# Patient Record
Sex: Female | Born: 1940 | Race: Black or African American | Hispanic: No | Marital: Married | Smoking: Never smoker
Health system: Southern US, Community
[De-identification: ages and names within clinical notes are randomized; demographics above are authoritative.]

## PROBLEM LIST (undated history)

## (undated) DIAGNOSIS — E119 Type 2 diabetes mellitus without complications: Secondary | ICD-10-CM

## (undated) DIAGNOSIS — M1990 Unspecified osteoarthritis, unspecified site: Secondary | ICD-10-CM

## (undated) DIAGNOSIS — I517 Cardiomegaly: Secondary | ICD-10-CM

## (undated) DIAGNOSIS — K219 Gastro-esophageal reflux disease without esophagitis: Secondary | ICD-10-CM

## (undated) DIAGNOSIS — I10 Essential (primary) hypertension: Secondary | ICD-10-CM

## (undated) DIAGNOSIS — E785 Hyperlipidemia, unspecified: Secondary | ICD-10-CM

## (undated) DIAGNOSIS — I200 Unstable angina: Secondary | ICD-10-CM

## (undated) HISTORY — PX: ABDOMINAL HYSTERECTOMY: SHX81

## (undated) HISTORY — DX: Gastro-esophageal reflux disease without esophagitis: K21.9

## (undated) HISTORY — DX: Hyperlipidemia, unspecified: E78.5

## (undated) HISTORY — DX: Cardiomegaly: I51.7

## (undated) HISTORY — DX: Unstable angina: I20.0

## (undated) HISTORY — DX: Unspecified osteoarthritis, unspecified site: M19.90

## (undated) HISTORY — DX: Type 2 diabetes mellitus without complications: E11.9

---

## 2001-04-16 ENCOUNTER — Ambulatory Visit (HOSPITAL_COMMUNITY): Admission: RE | Admit: 2001-04-16 | Discharge: 2001-04-16 | Payer: Self-pay | Admitting: General Surgery

## 2001-05-20 ENCOUNTER — Ambulatory Visit (HOSPITAL_COMMUNITY): Admission: RE | Admit: 2001-05-20 | Discharge: 2001-05-20 | Payer: Self-pay | Admitting: Family Medicine

## 2001-05-20 ENCOUNTER — Encounter: Payer: Self-pay | Admitting: Family Medicine

## 2001-06-25 ENCOUNTER — Other Ambulatory Visit: Admission: RE | Admit: 2001-06-25 | Discharge: 2001-06-25 | Payer: Self-pay | Admitting: Family Medicine

## 2001-09-01 ENCOUNTER — Ambulatory Visit (HOSPITAL_COMMUNITY): Admission: RE | Admit: 2001-09-01 | Discharge: 2001-09-01 | Payer: Self-pay | Admitting: Family Medicine

## 2001-09-01 ENCOUNTER — Encounter: Payer: Self-pay | Admitting: Family Medicine

## 2002-05-21 ENCOUNTER — Encounter: Payer: Self-pay | Admitting: Family Medicine

## 2002-05-21 ENCOUNTER — Ambulatory Visit (HOSPITAL_COMMUNITY): Admission: RE | Admit: 2002-05-21 | Discharge: 2002-05-21 | Payer: Self-pay | Admitting: Family Medicine

## 2002-08-10 ENCOUNTER — Ambulatory Visit (HOSPITAL_COMMUNITY): Admission: RE | Admit: 2002-08-10 | Discharge: 2002-08-10 | Payer: Self-pay | Admitting: Family Medicine

## 2002-08-10 ENCOUNTER — Encounter: Payer: Self-pay | Admitting: Family Medicine

## 2002-08-12 ENCOUNTER — Encounter (HOSPITAL_COMMUNITY): Admission: RE | Admit: 2002-08-12 | Discharge: 2002-09-11 | Payer: Self-pay | Admitting: Family Medicine

## 2002-08-12 ENCOUNTER — Encounter: Payer: Self-pay | Admitting: Family Medicine

## 2002-09-14 ENCOUNTER — Encounter: Payer: Self-pay | Admitting: Family Medicine

## 2002-09-14 ENCOUNTER — Ambulatory Visit (HOSPITAL_COMMUNITY): Admission: RE | Admit: 2002-09-14 | Discharge: 2002-09-14 | Payer: Self-pay | Admitting: Family Medicine

## 2002-10-22 HISTORY — PX: CHOLECYSTECTOMY: SHX55

## 2002-11-13 ENCOUNTER — Ambulatory Visit (HOSPITAL_COMMUNITY): Admission: RE | Admit: 2002-11-13 | Discharge: 2002-11-13 | Payer: Self-pay | Admitting: General Surgery

## 2002-11-24 ENCOUNTER — Encounter: Payer: Self-pay | Admitting: General Surgery

## 2002-11-24 ENCOUNTER — Observation Stay (HOSPITAL_COMMUNITY): Admission: RE | Admit: 2002-11-24 | Discharge: 2002-11-25 | Payer: Self-pay | Admitting: General Surgery

## 2003-06-04 ENCOUNTER — Ambulatory Visit (HOSPITAL_COMMUNITY): Admission: RE | Admit: 2003-06-04 | Discharge: 2003-06-04 | Payer: Self-pay | Admitting: Family Medicine

## 2003-06-04 ENCOUNTER — Encounter: Payer: Self-pay | Admitting: Family Medicine

## 2004-06-13 ENCOUNTER — Ambulatory Visit (HOSPITAL_COMMUNITY): Admission: RE | Admit: 2004-06-13 | Discharge: 2004-06-13 | Payer: Self-pay | Admitting: Family Medicine

## 2004-08-10 ENCOUNTER — Ambulatory Visit (HOSPITAL_COMMUNITY): Admission: RE | Admit: 2004-08-10 | Discharge: 2004-08-10 | Payer: Self-pay | Admitting: Family Medicine

## 2004-08-11 ENCOUNTER — Ambulatory Visit (HOSPITAL_COMMUNITY): Admission: RE | Admit: 2004-08-11 | Discharge: 2004-08-11 | Payer: Self-pay | Admitting: Family Medicine

## 2004-09-18 ENCOUNTER — Ambulatory Visit: Payer: Self-pay | Admitting: Family Medicine

## 2004-10-02 ENCOUNTER — Ambulatory Visit: Payer: Self-pay | Admitting: Family Medicine

## 2004-11-29 ENCOUNTER — Ambulatory Visit: Payer: Self-pay | Admitting: Family Medicine

## 2005-03-23 ENCOUNTER — Ambulatory Visit: Payer: Self-pay | Admitting: Family Medicine

## 2005-06-15 ENCOUNTER — Ambulatory Visit (HOSPITAL_COMMUNITY): Admission: RE | Admit: 2005-06-15 | Discharge: 2005-06-15 | Payer: Self-pay | Admitting: Family Medicine

## 2005-06-27 ENCOUNTER — Ambulatory Visit (HOSPITAL_COMMUNITY): Admission: RE | Admit: 2005-06-27 | Discharge: 2005-06-27 | Payer: Self-pay | Admitting: Family Medicine

## 2005-08-07 ENCOUNTER — Ambulatory Visit: Payer: Self-pay | Admitting: Family Medicine

## 2005-09-18 ENCOUNTER — Ambulatory Visit: Payer: Self-pay | Admitting: Family Medicine

## 2005-11-19 ENCOUNTER — Ambulatory Visit: Payer: Self-pay | Admitting: Family Medicine

## 2006-02-27 ENCOUNTER — Ambulatory Visit: Payer: Self-pay | Admitting: Family Medicine

## 2006-05-14 ENCOUNTER — Ambulatory Visit (HOSPITAL_COMMUNITY): Admission: RE | Admit: 2006-05-14 | Discharge: 2006-05-14 | Payer: Self-pay | Admitting: General Surgery

## 2006-07-02 ENCOUNTER — Ambulatory Visit (HOSPITAL_COMMUNITY): Admission: RE | Admit: 2006-07-02 | Discharge: 2006-07-02 | Payer: Self-pay | Admitting: Family Medicine

## 2006-08-01 ENCOUNTER — Ambulatory Visit: Payer: Self-pay | Admitting: Family Medicine

## 2006-08-17 ENCOUNTER — Encounter: Payer: Self-pay | Admitting: Family Medicine

## 2006-08-17 LAB — CONVERTED CEMR LAB: Pap Smear: NORMAL

## 2006-08-19 ENCOUNTER — Other Ambulatory Visit: Admission: RE | Admit: 2006-08-19 | Discharge: 2006-08-19 | Payer: Self-pay | Admitting: Family Medicine

## 2006-08-19 ENCOUNTER — Encounter: Payer: Self-pay | Admitting: Family Medicine

## 2006-08-19 ENCOUNTER — Ambulatory Visit: Payer: Self-pay | Admitting: Family Medicine

## 2006-09-30 ENCOUNTER — Ambulatory Visit: Payer: Self-pay | Admitting: Family Medicine

## 2006-11-18 ENCOUNTER — Ambulatory Visit: Payer: Self-pay | Admitting: Family Medicine

## 2006-12-23 ENCOUNTER — Ambulatory Visit: Payer: Self-pay | Admitting: Family Medicine

## 2007-01-02 ENCOUNTER — Ambulatory Visit: Payer: Self-pay | Admitting: Family Medicine

## 2007-02-14 ENCOUNTER — Encounter: Payer: Self-pay | Admitting: Family Medicine

## 2007-02-14 LAB — CONVERTED CEMR LAB
CO2: 22 meq/L (ref 19–32)
Calcium: 9.5 mg/dL (ref 8.4–10.5)
Glucose, Bld: 255 mg/dL — ABNORMAL HIGH (ref 70–99)
Hgb A1c MFr Bld: 13.7 % — ABNORMAL HIGH (ref 4.6–6.1)
Potassium: 4.1 meq/L (ref 3.5–5.3)
Sodium: 141 meq/L (ref 135–145)

## 2007-02-17 ENCOUNTER — Ambulatory Visit: Payer: Self-pay | Admitting: Family Medicine

## 2007-02-17 LAB — CONVERTED CEMR LAB
Cholesterol: 149 mg/dL (ref 0–200)
Hemoglobin: 12.4 g/dL (ref 12.0–15.0)
LDL Cholesterol: 83 mg/dL (ref 0–99)
Lymphs Abs: 2.2 10*3/uL (ref 0.7–3.3)
MCHC: 32.1 g/dL (ref 30.0–36.0)
MCV: 89.4 fL (ref 78.0–100.0)
Monocytes Absolute: 0.5 10*3/uL (ref 0.2–0.7)
Monocytes Relative: 10 % (ref 3–11)
Neutro Abs: 1.8 10*3/uL (ref 1.7–7.7)
Neutrophils Relative %: 37 % — ABNORMAL LOW (ref 43–77)
RBC: 4.32 M/uL (ref 3.87–5.11)
Total CHOL/HDL Ratio: 3.2
Triglycerides: 95 mg/dL (ref <150)
VLDL: 19 mg/dL (ref 0–40)
WBC: 4.7 10*3/uL (ref 4.0–10.5)

## 2007-02-18 ENCOUNTER — Ambulatory Visit (HOSPITAL_COMMUNITY): Admission: RE | Admit: 2007-02-18 | Discharge: 2007-02-18 | Payer: Self-pay | Admitting: Family Medicine

## 2007-02-18 ENCOUNTER — Encounter: Payer: Self-pay | Admitting: Family Medicine

## 2007-03-31 ENCOUNTER — Ambulatory Visit: Payer: Self-pay | Admitting: Family Medicine

## 2007-06-18 ENCOUNTER — Ambulatory Visit: Payer: Self-pay | Admitting: Family Medicine

## 2007-07-04 ENCOUNTER — Ambulatory Visit (HOSPITAL_COMMUNITY): Admission: RE | Admit: 2007-07-04 | Discharge: 2007-07-04 | Payer: Self-pay | Admitting: Family Medicine

## 2007-07-29 ENCOUNTER — Ambulatory Visit: Payer: Self-pay | Admitting: Family Medicine

## 2007-09-22 ENCOUNTER — Encounter: Payer: Self-pay | Admitting: Family Medicine

## 2007-09-22 LAB — CONVERTED CEMR LAB
BUN: 21 mg/dL (ref 6–23)
Bilirubin, Direct: 0.1 mg/dL (ref 0.0–0.3)
CO2: 26 meq/L (ref 19–32)
Chloride: 106 meq/L (ref 96–112)
Creatinine, Ser: 0.85 mg/dL (ref 0.40–1.20)
Glucose, Bld: 73 mg/dL (ref 70–99)
Indirect Bilirubin: 0.4 mg/dL (ref 0.0–0.9)
LDL Cholesterol: 80 mg/dL (ref 0–99)
Potassium: 4.4 meq/L (ref 3.5–5.3)
Total Bilirubin: 0.5 mg/dL (ref 0.3–1.2)
Total Protein: 7.4 g/dL (ref 6.0–8.3)
Triglycerides: 93 mg/dL (ref <150)
VLDL: 19 mg/dL (ref 0–40)

## 2007-09-24 ENCOUNTER — Ambulatory Visit: Payer: Self-pay | Admitting: Family Medicine

## 2007-09-26 ENCOUNTER — Ambulatory Visit (HOSPITAL_COMMUNITY): Admission: RE | Admit: 2007-09-26 | Discharge: 2007-09-26 | Payer: Self-pay | Admitting: Pediatrics

## 2007-10-23 ENCOUNTER — Encounter: Payer: Self-pay | Admitting: Family Medicine

## 2008-01-13 ENCOUNTER — Ambulatory Visit: Payer: Self-pay | Admitting: Family Medicine

## 2008-04-14 ENCOUNTER — Encounter: Payer: Self-pay | Admitting: Family Medicine

## 2008-04-14 LAB — CONVERTED CEMR LAB
Alkaline Phosphatase: 74 units/L (ref 39–117)
BUN: 20 mg/dL (ref 6–23)
Basophils Relative: 1 % (ref 0–1)
CO2: 24 meq/L (ref 19–32)
Chloride: 107 meq/L (ref 96–112)
Creatinine, Ser: 0.89 mg/dL (ref 0.40–1.20)
Eosinophils Relative: 3 % (ref 0–5)
HCT: 38.8 % (ref 36.0–46.0)
Hemoglobin: 11.7 g/dL — ABNORMAL LOW (ref 12.0–15.0)
Indirect Bilirubin: 0.2 mg/dL (ref 0.0–0.9)
LDL Cholesterol: 78 mg/dL (ref 0–99)
MCHC: 30.2 g/dL (ref 30.0–36.0)
Monocytes Absolute: 0.6 10*3/uL (ref 0.1–1.0)
Monocytes Relative: 10 % (ref 3–12)
Neutro Abs: 2 10*3/uL (ref 1.7–7.7)
RBC: 4.18 M/uL (ref 3.87–5.11)
Total Bilirubin: 0.3 mg/dL (ref 0.3–1.2)
Triglycerides: 116 mg/dL (ref <150)

## 2008-04-15 ENCOUNTER — Ambulatory Visit: Payer: Self-pay | Admitting: Family Medicine

## 2008-04-19 ENCOUNTER — Encounter: Payer: Self-pay | Admitting: Family Medicine

## 2008-04-21 ENCOUNTER — Encounter: Payer: Self-pay | Admitting: Family Medicine

## 2008-04-21 DIAGNOSIS — I10 Essential (primary) hypertension: Secondary | ICD-10-CM | POA: Insufficient documentation

## 2008-04-21 DIAGNOSIS — E119 Type 2 diabetes mellitus without complications: Secondary | ICD-10-CM

## 2008-04-21 DIAGNOSIS — E785 Hyperlipidemia, unspecified: Secondary | ICD-10-CM | POA: Insufficient documentation

## 2008-04-21 DIAGNOSIS — Z794 Long term (current) use of insulin: Secondary | ICD-10-CM

## 2008-04-21 DIAGNOSIS — E1122 Type 2 diabetes mellitus with diabetic chronic kidney disease: Secondary | ICD-10-CM | POA: Insufficient documentation

## 2008-04-21 DIAGNOSIS — I130 Hypertensive heart and chronic kidney disease with heart failure and stage 1 through stage 4 chronic kidney disease, or unspecified chronic kidney disease: Secondary | ICD-10-CM | POA: Insufficient documentation

## 2008-04-21 DIAGNOSIS — E669 Obesity, unspecified: Secondary | ICD-10-CM | POA: Insufficient documentation

## 2008-04-21 DIAGNOSIS — E782 Mixed hyperlipidemia: Secondary | ICD-10-CM | POA: Insufficient documentation

## 2008-04-21 DIAGNOSIS — E1165 Type 2 diabetes mellitus with hyperglycemia: Secondary | ICD-10-CM | POA: Insufficient documentation

## 2008-07-06 ENCOUNTER — Ambulatory Visit (HOSPITAL_COMMUNITY): Admission: RE | Admit: 2008-07-06 | Discharge: 2008-07-06 | Payer: Self-pay | Admitting: Family Medicine

## 2008-07-20 ENCOUNTER — Ambulatory Visit: Payer: Self-pay | Admitting: Family Medicine

## 2008-08-16 ENCOUNTER — Ambulatory Visit: Payer: Self-pay | Admitting: Family Medicine

## 2008-08-16 LAB — CONVERTED CEMR LAB: Glucose, Bld: 130 mg/dL

## 2008-09-08 ENCOUNTER — Encounter: Payer: Self-pay | Admitting: Family Medicine

## 2008-10-18 ENCOUNTER — Encounter: Payer: Self-pay | Admitting: Family Medicine

## 2008-10-18 LAB — CONVERTED CEMR LAB
ALT: 21 units/L (ref 0–35)
Cholesterol: 140 mg/dL (ref 0–200)
HDL: 47 mg/dL (ref >39)
Indirect Bilirubin: 0.3 mg/dL (ref 0.0–0.9)
Potassium: 4.5 meq/L (ref 3.5–5.3)
Sodium: 144 meq/L (ref 135–145)
Total CHOL/HDL Ratio: 3
Total Protein: 7.1 g/dL (ref 6.0–8.3)
Triglycerides: 90 mg/dL (ref <150)
VLDL: 18 mg/dL (ref 0–40)

## 2008-11-16 ENCOUNTER — Encounter: Payer: Self-pay | Admitting: Family Medicine

## 2008-12-21 ENCOUNTER — Ambulatory Visit: Payer: Self-pay | Admitting: Family Medicine

## 2008-12-21 LAB — CONVERTED CEMR LAB
Glucose, Bld: 170 mg/dL
Hgb A1c MFr Bld: 7.1 %

## 2009-01-26 ENCOUNTER — Encounter: Payer: Self-pay | Admitting: Family Medicine

## 2009-03-08 ENCOUNTER — Ambulatory Visit: Payer: Self-pay | Admitting: Family Medicine

## 2009-03-09 ENCOUNTER — Telehealth: Payer: Self-pay | Admitting: Family Medicine

## 2009-04-18 ENCOUNTER — Encounter: Payer: Self-pay | Admitting: Family Medicine

## 2009-04-18 LAB — CONVERTED CEMR LAB
BUN: 13 mg/dL (ref 6–23)
Basophils Absolute: 0 10*3/uL (ref 0.0–0.1)
Bilirubin, Direct: 0.1 mg/dL (ref 0.0–0.3)
CO2: 23 meq/L (ref 19–32)
Chloride: 105 meq/L (ref 96–112)
Cholesterol: 157 mg/dL (ref 0–200)
Eosinophils Relative: 2 % (ref 0–5)
Glucose, Bld: 173 mg/dL — ABNORMAL HIGH (ref 70–99)
HCT: 37.9 % (ref 36.0–46.0)
Hemoglobin: 11.8 g/dL — ABNORMAL LOW (ref 12.0–15.0)
Indirect Bilirubin: 0.3 mg/dL (ref 0.0–0.9)
LDL Cholesterol: 77 mg/dL (ref 0–99)
Lymphocytes Relative: 25 % (ref 12–46)
Microalb Creat Ratio: 9.3 mg/g (ref 0.0–30.0)
Monocytes Absolute: 0.9 10*3/uL (ref 0.1–1.0)
Potassium: 4 meq/L (ref 3.5–5.3)
RDW: 14.6 % (ref 11.5–15.5)
Total Bilirubin: 0.4 mg/dL (ref 0.3–1.2)
VLDL: 21 mg/dL (ref 0–40)

## 2009-04-21 ENCOUNTER — Ambulatory Visit: Payer: Self-pay | Admitting: Family Medicine

## 2009-04-21 LAB — CONVERTED CEMR LAB
Glucose, Bld: 209 mg/dL
Hgb A1c MFr Bld: 7.1 %

## 2009-07-08 ENCOUNTER — Ambulatory Visit (HOSPITAL_COMMUNITY): Admission: RE | Admit: 2009-07-08 | Discharge: 2009-07-08 | Payer: Self-pay | Admitting: Family Medicine

## 2009-07-21 ENCOUNTER — Ambulatory Visit: Payer: Self-pay | Admitting: Family Medicine

## 2009-07-27 ENCOUNTER — Encounter: Payer: Self-pay | Admitting: Family Medicine

## 2009-07-28 ENCOUNTER — Ambulatory Visit: Payer: Self-pay | Admitting: Family Medicine

## 2009-07-28 LAB — CONVERTED CEMR LAB
Glucose, Bld: 172 mg/dL
Hgb A1c MFr Bld: 6.9 %

## 2009-08-03 ENCOUNTER — Telehealth: Payer: Self-pay | Admitting: Family Medicine

## 2009-08-04 ENCOUNTER — Telehealth: Payer: Self-pay | Admitting: Family Medicine

## 2009-08-08 ENCOUNTER — Telehealth: Payer: Self-pay | Admitting: Family Medicine

## 2009-08-25 ENCOUNTER — Telehealth: Payer: Self-pay | Admitting: Family Medicine

## 2009-08-29 ENCOUNTER — Telehealth: Payer: Self-pay | Admitting: Family Medicine

## 2009-08-31 LAB — CONVERTED CEMR LAB
ALT: 19 units/L (ref 0–35)
AST: 17 units/L (ref 0–37)
Albumin: 4.4 g/dL (ref 3.5–5.2)
Alkaline Phosphatase: 60 units/L (ref 39–117)
Calcium: 9.9 mg/dL (ref 8.4–10.5)
Cholesterol: 163 mg/dL (ref 0–200)
Creatinine, Ser: 0.96 mg/dL (ref 0.40–1.20)
HDL: 50 mg/dL (ref >39)
Sodium: 143 meq/L (ref 135–145)
Total CHOL/HDL Ratio: 3.3

## 2009-09-20 ENCOUNTER — Telehealth: Payer: Self-pay | Admitting: Family Medicine

## 2009-09-29 ENCOUNTER — Encounter: Payer: Self-pay | Admitting: Family Medicine

## 2009-09-30 ENCOUNTER — Encounter: Payer: Self-pay | Admitting: Family Medicine

## 2009-10-05 ENCOUNTER — Encounter: Payer: Self-pay | Admitting: Family Medicine

## 2009-10-07 ENCOUNTER — Telehealth: Payer: Self-pay | Admitting: Family Medicine

## 2009-12-13 ENCOUNTER — Encounter: Payer: Self-pay | Admitting: Family Medicine

## 2009-12-29 LAB — CONVERTED CEMR LAB
ALT: 29 units/L (ref 0–35)
AST: 21 units/L (ref 0–37)
Alkaline Phosphatase: 66 units/L (ref 39–117)
Bilirubin, Direct: 0.1 mg/dL (ref 0.0–0.3)
Calcium: 8.7 mg/dL (ref 8.4–10.5)
Cholesterol: 139 mg/dL (ref 0–200)
Creatinine, Ser: 0.83 mg/dL (ref 0.40–1.20)
Glucose, Bld: 182 mg/dL — ABNORMAL HIGH (ref 70–99)
Sodium: 142 meq/L (ref 135–145)
Total Bilirubin: 0.2 mg/dL — ABNORMAL LOW (ref 0.3–1.2)
Total CHOL/HDL Ratio: 3.2

## 2010-01-04 LAB — CONVERTED CEMR LAB: Hgb A1c MFr Bld: 8.1 % — ABNORMAL HIGH (ref 4.6–6.1)

## 2010-01-13 ENCOUNTER — Other Ambulatory Visit: Admission: RE | Admit: 2010-01-13 | Discharge: 2010-01-13 | Payer: Self-pay | Admitting: Family Medicine

## 2010-01-13 ENCOUNTER — Ambulatory Visit: Payer: Self-pay | Admitting: Family Medicine

## 2010-01-16 ENCOUNTER — Telehealth: Payer: Self-pay | Admitting: Family Medicine

## 2010-01-26 ENCOUNTER — Encounter: Payer: Self-pay | Admitting: Family Medicine

## 2010-02-13 ENCOUNTER — Ambulatory Visit: Payer: Self-pay | Admitting: Family Medicine

## 2010-05-15 LAB — CONVERTED CEMR LAB
BUN: 14 mg/dL (ref 6–23)
Basophils Absolute: 0 10*3/uL (ref 0.0–0.1)
Basophils Relative: 1 % (ref 0–1)
Calcium: 9.8 mg/dL (ref 8.4–10.5)
Chloride: 108 meq/L (ref 96–112)
Creatinine, Ser: 0.9 mg/dL (ref 0.40–1.20)
Eosinophils Absolute: 0.3 10*3/uL (ref 0.0–0.7)
Eosinophils Relative: 5 % (ref 0–5)
HCT: 37.7 % (ref 36.0–46.0)
Hemoglobin: 12 g/dL (ref 12.0–15.0)
Hgb A1c MFr Bld: 7 % — ABNORMAL HIGH (ref <5.7)
MCHC: 31.8 g/dL (ref 30.0–36.0)
MCV: 91.3 fL (ref 78.0–100.0)
Monocytes Absolute: 0.5 10*3/uL (ref 0.1–1.0)
Monocytes Relative: 9 % (ref 3–12)
Neutro Abs: 2.3 10*3/uL (ref 1.7–7.7)
RBC: 4.13 M/uL (ref 3.87–5.11)
RDW: 13.6 % (ref 11.5–15.5)
TSH: 3.737 microintl units/mL (ref 0.350–4.500)

## 2010-05-17 ENCOUNTER — Ambulatory Visit: Payer: Self-pay | Admitting: Family Medicine

## 2010-05-19 ENCOUNTER — Telehealth (INDEPENDENT_AMBULATORY_CARE_PROVIDER_SITE_OTHER): Payer: Self-pay | Admitting: *Deleted

## 2010-05-22 ENCOUNTER — Telehealth: Payer: Self-pay | Admitting: Family Medicine

## 2010-07-10 ENCOUNTER — Encounter: Payer: Self-pay | Admitting: Family Medicine

## 2010-07-17 ENCOUNTER — Ambulatory Visit (HOSPITAL_COMMUNITY): Admission: RE | Admit: 2010-07-17 | Discharge: 2010-07-17 | Payer: Self-pay | Admitting: Family Medicine

## 2010-07-19 ENCOUNTER — Ambulatory Visit: Payer: Self-pay | Admitting: Family Medicine

## 2010-09-13 LAB — CONVERTED CEMR LAB
ALT: 15 units/L (ref 0–35)
AST: 15 units/L (ref 0–37)
Bilirubin, Direct: 0.1 mg/dL (ref 0.0–0.3)
Cholesterol: 179 mg/dL (ref 0–200)
Hgb A1c MFr Bld: 6.8 % — ABNORMAL HIGH (ref <5.7)
Indirect Bilirubin: 0.3 mg/dL (ref 0.0–0.9)
Total Bilirubin: 0.4 mg/dL (ref 0.3–1.2)
Total CHOL/HDL Ratio: 3.7

## 2010-09-21 ENCOUNTER — Ambulatory Visit: Payer: Self-pay | Admitting: Family Medicine

## 2010-09-21 LAB — HM DIABETES FOOT EXAM

## 2010-09-22 ENCOUNTER — Encounter: Payer: Self-pay | Admitting: Family Medicine

## 2010-09-22 LAB — CONVERTED CEMR LAB: Microalb Creat Ratio: 3.8 mg/g (ref 0.0–30.0)

## 2010-11-12 ENCOUNTER — Encounter: Payer: Self-pay | Admitting: Family Medicine

## 2010-11-21 NOTE — Medication Information (Signed)
Summary: Tax adviser   Imported By: Lind Guest 07/10/2010 10:17:18  _____________________________________________________________________  External Attachment:    Type:   Image     Comment:   External Document

## 2010-11-21 NOTE — Progress Notes (Signed)
Summary: meds   Phone Note Call from Patient   Summary of Call: needs to speak with nurse about clearing up somethings about medication. [phone]  Initial call taken by: Rudene Anda,  January 16, 2010 9:41 AM  Follow-up for Phone Call        Patient understands what she is supossed to be taking  Follow-up by: Everitt Amber LPN,  January 16, 2010 5:25 PM    Prescriptions: BENAZEPRIL HCL 40 MG TABS (BENAZEPRIL HCL) Take 1 tablet by mouth once a day  #30 x 2   Entered by:   Everitt Amber LPN   Authorized by:   Syliva Overman MD   Signed by:   Everitt Amber LPN on 60/63/0160   Method used:   Electronically to        [facility]  [address] (retail)       [address]       [place], [place]  [address]       Ph: [phone]       Fax: [phone]   RxID:   [id number]

## 2010-11-21 NOTE — Progress Notes (Signed)
Summary: DERMATOLGY  DERMATOLGY   Imported By: Lind Guest 03/07/2010 13:39:51  _____________________________________________________________________  External Attachment:    Type:   Image     Comment:   External Document

## 2010-11-21 NOTE — Assessment & Plan Note (Signed)
Summary: office visit   Vital Signs:  Patient profile:   70 year old female Menstrual status:  hysterectomy Height:      65.5 inches Weight:      193.75 pounds BMI:     31.87 O2 Sat:      98 % on Room air Pulse rate:   75 / minute Pulse rhythm:   regular Resp:     16 per minute BP sitting:   120 / 80  (left arm)  Vitals Entered By: Adella Hare LPN (September 21, 2010 9:23 AM)  Nutrition Counseling: Patient's BMI is greater than 25 and therefore counseled on weight management options.  O2 Flow:  Room air CC: follow-up visit Is Patient Diabetic? Yes Pain Assessment Patient in pain? no        CC:  follow-up visit.  History of Present Illness: Reports  that she has been doing well. Denies recent fever or chills. Denies sinus pressure, nasal congestion , ear pain or sore throat. Denies chest congestion, or cough productive of sputum. Denies chest pain, palpitations, PND, orthopnea or leg swelling. Denies abdominal pain, nausea, vomitting, diarrhea or constipation. Denies change in bowel movements or bloody stool. Denies dysuria , frequency, incontinence or hesitancy. Denies  joint pain, swelling, or reduced mobility. Denies headaches, vertigo, seizures. Denies depression, anxiety or insomnia. Denies  rash, lesions, or itch.     Current Medications (verified): 1)  Atenolol 50 Mg  Tabs (Atenolol) .... One Tab By Mouth Two Times A Day 2)  Avapro 300 Mg  Tabs (Irbesartan) .... One Tab By Mouth Once Daily 3)  Lipitor 40 Mg  Tabs (Atorvastatin Calcium) .... One Tab By Mouth By Mouth At Bedtime 4)  Glucophage Xr 500 Mg  Xr24h-Tab (Metformin Hcl) .... Two Tabs By Mouth Two Times A Day 5)  Glipizide Xl 10 Mg  Xr24h-Tab (Glipizide) .... Two Tabs By Mouth Once Daily 6)  Onetouch Ultra Test  Strp (Glucose Blood) .... Use As Directed Twice Daily 7)  Onetouch Ultrasoft Lancets  Misc (Lancets) .... Use As Directed Twice Daily 8)  Mometasone Furoate 0.1 % Soln (Mometasone Furoate)  .... Apply Topically Twice Daily 9)  Nitrolingual Duo Pack 0.4 Mg/spray Soln (Nitroglycerin) .... Use 1 Spray Every  As Needed Max of 3 Sprays in As Directed 10)  Clotrimazole-Betamethasone 1-0.05 % Crea (Clotrimazole-Betamethasone) .... Uad 11)  Novofine 30g X 8 Mm Misc (Insulin Pen Needle) .... Uad 12)  Lantus Solostar 100 Unit/ml Soln (Insulin Glargine) .... 50 Units At Bedtime 13)  Allegra 180 Mg Tabs (Fexofenadine Hcl) .... Take 1 Tablet By Mouth Once A Day 14)  Bd Insulin Syringe Ultrafine 29g X 1/2" 1 Ml Misc (Insulin Syringe-Needle U-100) .... Uad 15)  Benazepril Hcl 40 Mg Tabs (Benazepril Hcl) .... Take 1 Tablet By Mouth Once A Day  Allergies (verified): 1)  ! Sulfa  Review of Systems      See HPI Eyes:  Denies discharge, eye pain, and red eye. MS:  Complains of joint pain; bilateral knee pain , relieved by tylenol arthrits on avg 2 per week. Endo:  Denies excessive hunger and excessive thirst; tests on avg twice daily sometimes 3, fgastings gen range 119, bedtime gen 140. Heme:  Denies abnormal bruising and bleeding. Allergy:  Denies hives or rash and itching eyes.  Physical Exam  General:  Well-developed,well-nourished,in no acute distress; alert,appropriate and cooperative throughout examination HEENT: No facial asymmetry,  EOMI, No sinus tenderness, TM's Clear, oropharynx  pink and moist.  Chest: Clear to auscultation bilaterally.  CVS: S1, S2, No murmurs, No S3.   Abd: Soft, Nontender.  MS: Adequate ROM spine, hips,  and knees. Decreased in left shoulder with tenderness Ext: No edema.   CNS: CN 2-12 intact, power tone and sensation normal throughout.   Skin: Intact, no rassh or ulceration noted. Psych: Good eye contact, normal affect.  Memory intact, not anxious or depressed appearing.   Diabetes Management Exam:    Foot Exam (with socks and/or shoes not present):       Sensory-Monofilament:          Left foot: normal          Right foot: normal        Inspection:          Left foot: abnormal             Comments: callous left great toe          Right foot: abnormal             Comments: callous right grt toe    Eye Exam:       Eye Exam done elsewhere          Date: 02/01/2010          Results: normal          Done by: dr vision   Impression & Recommendations:  Problem # 1:  OBESITY (ICD-278.00) Assessment Comment Only  Ht: 65.5 (09/21/2010)   Wt: 193.75 (09/21/2010)   BMI: 31.87 (09/21/2010) therapeutic lifestyle change discussed and encouraged  Problem # 2:  HYPERLIPIDEMIA (ICD-272.4) Assessment: Deteriorated  The following medications were removed from the medication list:    Zetia 10 Mg Tabs (Ezetimibe) ..... One tab by mouth at bedtime Her updated medication list for this problem includes:    Lipitor 40 Mg Tabs (Atorvastatin calcium) ..... One tab by mouth by mouth at bedtime Low fat dietdiscussed and encouraged  Orders: T-Lipid Profile [phone]) T-Hepatic Function [phone])  Labs Reviewed: SGOT: 15 (09/13/2010)   SGPT: 15 (09/13/2010)   HDL:48 (09/13/2010), 43 (12/27/2009)  LDL:108 (09/13/2010), 74 (29/56/2130)  Chol:179 (09/13/2010), 139 (12/27/2009)  Trig:116 (09/13/2010), 109 (12/27/2009)  Problem # 3:  DIABETES MELLITUS, TYPE II (ICD-250.00) Assessment: Improved  Her updated medication list for this problem includes:    Avapro 300 Mg Tabs (Irbesartan) ..... One tab by mouth once daily    Glucophage Xr 500 Mg Xr24h-tab (Metformin hcl) .Marland Kitchen..Marland Kitchen Two tabs by mouth two times a day    Glipizide Xl 10 Mg Xr24h-tab (Glipizide) .Marland Kitchen..Marland Kitchen Two tabs by mouth once daily    Lantus Solostar 100 Unit/ml Soln (Insulin glargine) .Marland KitchenMarland KitchenMarland KitchenMarland Kitchen 50 units at bedtime    Benazepril Hcl 40 Mg Tabs (Benazepril hcl) .Marland Kitchen... Take 1 tablet by mouth once a day  Orders: Medicare Electronic Prescription [phone]) T- Hemoglobin A1C ([id number]) T-Urine Microalbumin w/creat. ratio [id number])  Labs Reviewed: Creat: 0.90 (05/15/2010)      Last Eye Exam: normal (02/01/2010) Reviewed HgBA1c results: 6.8 (09/13/2010)  7.0 (05/15/2010)  Problem # 4:  HYPERTENSION (ICD-401.9) Assessment: Improved  Her updated medication list for this problem includes:    Atenolol 50 Mg Tabs (Atenolol) ..... One tab by mouth two times a day    Avapro 300 Mg Tabs (Irbesartan) ..... One tab by mouth once daily    Benazepril Hcl 40 Mg Tabs (Benazepril hcl) .Marland Kitchen... Take 1 tablet by mouth once a day  Orders: Medicare Electronic Prescription [phone]) T-Basic Metabolic Panel [phone])  BP today:  120/80 Prior BP: 140/80 (05/17/2010)  Labs Reviewed: K+: 4.6 (05/15/2010) Creat: : 0.90 (05/15/2010)   Chol: 179 (09/13/2010)   HDL: 48 (09/13/2010)   LDL: 108 (09/13/2010)   TG: 116 (09/13/2010)  Complete Medication List: 1)  Atenolol 50 Mg Tabs (Atenolol) .... One tab by mouth two times a day 2)  Avapro 300 Mg Tabs (Irbesartan) .... One tab by mouth once daily 3)  Lipitor 40 Mg Tabs (Atorvastatin calcium) .... One tab by mouth by mouth at bedtime 4)  Glucophage Xr 500 Mg Xr24h-tab (Metformin hcl) .... Two tabs by mouth two times a day 5)  Glipizide Xl 10 Mg Xr24h-tab (Glipizide) .... Two tabs by mouth once daily 6)  Onetouch Ultra Test Strp (Glucose blood) .... Use as directed twice daily 7)  Onetouch Ultrasoft Lancets Misc (Lancets) .... Use as directed twice daily 8)  Mometasone Furoate 0.1 % Soln (Mometasone furoate) .... Apply topically twice daily 9)  Nitrolingual Duo Pack 0.4 Mg/spray Soln (Nitroglycerin) .... Use 1 spray every  as needed max of 3 sprays in as directed 10)  Clotrimazole-betamethasone 1-0.05 % Crea (Clotrimazole-betamethasone) .... Uad 11)  Novofine 30g X 8 Mm Misc (Insulin pen needle) .... Uad 12)  Lantus Solostar 100 Unit/ml Soln (Insulin glargine) .... 50 units at bedtime 13)  Allegra 180 Mg Tabs (Fexofenadine hcl) .... Take 1 tablet by mouth once a day 14)  Bd Insulin Syringe Ultrafine 29g X 1/2" 1 Ml Misc  (Insulin syringe-needle u-100) .... Uad 15)  Benazepril Hcl 40 Mg Tabs (Benazepril hcl) .... Take 1 tablet by mouth once a day  Patient Instructions: 1)  Please schedule a follow-up appointment in 4 months. 2)  It is important that you exercise regularly at least 20 minutes 5 times a week. If you develop chest pain, have severe difficulty breathing, or feel very tired , stop exercising immediately and seek medical attention. 3)  You need to lose weight. Consider a lower calorie diet and regular exercise. Goal is 3 to 5 pounds, you have lost 11 pounds in the past year which is great!! 4)  BMP prior to visit, ICD-9: 5)  Hepatic Panel prior to visit, ICD-9:  fasting in 4 months 6)  Lipid Panel prior to visit, ICD-9: 7)  HbgA1C prior to visit, ICD-9: 8)  your cholesterol , bad, is too high, pls reduce fried and fatty foods, red meats and deserts, resume lipitor 7 days per week   Orders Added: 1)  Est. Patient Level IV [13086] 2)  Medicare Electronic Prescription [G8553] 3)  T-Basic Metabolic Panel [[id number]] 4)  T-Lipid Profile [80061-22930] 5)  T-Hepatic Function [80076-22960] 6)  T- Hemoglobin A1C [83036-23375] 7)  T-Urine Microalbumin w/creat. ratio [[id number]]

## 2010-11-21 NOTE — Letter (Signed)
Summary: xrays  xrays   Imported By: Rudene Anda 02/27/2010 14:03:29  _____________________________________________________________________  External Attachment:    Type:   Image     Comment:   External Document

## 2010-11-21 NOTE — Assessment & Plan Note (Signed)
Summary: ov   Vital Signs:  Patient profile:   70 year old female Menstrual status:  hysterectomy Height:      65.5 inches Weight:      194.50 pounds BMI:     31.99 O2 Sat:      96 % Pulse rate:   84 / minute Pulse rhythm:   regular Resp:     16 per minute BP sitting:   140 / 80  (right arm)  Vitals Entered By: Everitt Amber LPN (May 17, 2010 9:45 AM)  Nutrition Counseling: Patient's BMI is greater than 25 and therefore counseled on weight management options. CC: has a rash on her arms that itches now and then and also her left arm hurts when she lifts it   CC:  has a rash on her arms that itches now and then and also her left arm hurts when she lifts it.  History of Present Illness: Reports  that she has been  doing well. Denies recent fever or chills. Denies sinus pressure, nasal congestion , ear pain or sore throat. Denies chest congestion, or cough productive of sputum. Denies chest pain, palpitations, PND, orthopnea or leg swelling. Denies abdominal pain, nausea, vomitting, diarrhea or constipation. Denies change in bowel movements or bloody stool. Denies dysuria , frequency, incontinence or hesitancy.  Denies headaches, vertigo, seizures. Denies depression, anxiety or insomnia.  she has been diligent with diet and exercise and tests regularly, her fasting sugars are seldom over 130.     Current Medications (verified): 1)  Atenolol 50 Mg  Tabs (Atenolol) .... One Tab By Mouth Two Times A Day 2)  Zetia 10 Mg  Tabs (Ezetimibe) .... One Tab By Mouth At Bedtime 3)  Avapro 300 Mg  Tabs (Irbesartan) .... One Tab By Mouth Once Daily 4)  Lipitor 40 Mg  Tabs (Atorvastatin Calcium) .... One Tab By Mouth By Mouth At Bedtime 5)  Glucophage Xr 500 Mg  Xr24h-Tab (Metformin Hcl) .... Two Tabs By Mouth Two Times A Day 6)  Glipizide Xl 10 Mg  Xr24h-Tab (Glipizide) .... Two Tabs By Mouth Once Daily 7)  Onetouch Ultra Test  Strp (Glucose Blood) .... Use As Directed Twice Daily 8)   Onetouch Ultrasoft Lancets  Misc (Lancets) .... Use As Directed Twice Daily 9)  Mometasone Furoate 0.1 % Soln (Mometasone Furoate) .... Apply Topically Twice Daily 10)  Nitrolingual Duo Pack 0.4 Mg/spray Soln (Nitroglycerin) .... Use 1 Spray Every  As Needed Max of 3 Sprays in As Directed 11)  Clotrimazole-Betamethasone 1-0.05 % Crea (Clotrimazole-Betamethasone) .... Uad 12)  Novofine 30g X 8 Mm Misc (Insulin Pen Needle) .... Uad 13)  Lantus Solostar 100 Unit/ml Soln (Insulin Glargine) .... 50 Units At Bedtime 14)  Allegra 180 Mg Tabs (Fexofenadine Hcl) .... Take 1 Tablet By Mouth Once A Day 15)  Bd Insulin Syringe Ultrafine 29g X 1/2" 1 Ml Misc (Insulin Syringe-Needle U-100) .... Uad 16)  Benazepril Hcl 40 Mg Tabs (Benazepril Hcl) .... Take 1 Tablet By Mouth Once A Day 17)  Clobetasol Propionate 0.05 % Crea (Clobetasol Propionate) .... Uad  Allergies (verified): 1)  ! Sulfa  Review of Systems      See HPI Eyes:  Denies blurring, discharge, eye pain, and red eye. MS:  Complains of joint pain and stiffness; left shoulder pain with reduced ROM and stiffness since 4 days, she had been cutting bushes 2 days before, tylenol arthritis has helped. Derm:  Complains of lesion(s) and rash; rash o right forearm  has now spread to the arm, foor the past 3 weeks, clobetasdol hurts, she states tat soap and alcohol work best, advised same. Endo:  Denies cold intolerance, excessive hunger, excessive thirst, excessive urination, heat intolerance, polyuria, and weight change. Heme:  Denies abnormal bruising and bleeding. Allergy:  Denies hives or rash and itching eyes.  Physical Exam  General:  Well-developed,well-nourished,in no acute distress; alert,appropriate and cooperative throughout examination HEENT: No facial asymmetry,  EOMI, No sinus tenderness, TM's Clear, oropharynx  pink and moist.   Chest: Clear to auscultation bilaterally.  CVS: S1, S2, No murmurs, No S3.   Abd: Soft, Nontender.   MS: Adequate ROM spine, hips,  and knees. Decreased in left shoulder with tenderness Ext: No edema.   CNS: CN 2-12 intact, power tone and sensation normal throughout.   Skin: Intact, hyperpigmented macular rash on right forearm, lgr area involved with new area of involvement Psych: Good eye contact, normal affect.  Memory intact, not anxious or depressed appearing.   Diabetes Management Exam:    Foot Exam (with socks and/or shoes not present):       Sensory-Monofilament:          Left foot: diminished          Right foot: diminished       Inspection:          Left foot: normal          Right foot: normal       Nails:          Left foot: normal          Right foot: normal   Impression & Recommendations:  Problem # 1:  SHOULDER PAIN, LEFT (ICD-719.41) Assessment Deteriorated  Her updated medication list for this problem includes:    Ibuprofen 800 Mg Tabs (Ibuprofen) .Marland Kitchen... Take 1 tablet by mouth three times a day  Orders: Depo- Medrol 80mg  (J1040) Ketorolac-Toradol 15mg  (W2956) Admin of Therapeutic Inj  intramuscular or subcutaneous (21308)  Problem # 2:  OBESITY (ICD-278.00) Assessment: Improved  Ht: 65.5 (05/17/2010)   Wt: 194.50 (05/17/2010)   BMI: 31.99 (05/17/2010)  Problem # 3:  HYPERTENSION (ICD-401.9) Assessment: Deteriorated  The following medications were removed from the medication list:    Benazepril Hcl 40 Mg Tabs (Benazepril hcl) .Marland Kitchen... Take 1 tablet by mouth once a day Her updated medication list for this problem includes:    Atenolol 50 Mg Tabs (Atenolol) ..... One tab by mouth two times a day    Avapro 300 Mg Tabs (Irbesartan) ..... One tab by mouth once daily    Benazepril Hcl 40 Mg Tabs (Benazepril hcl) .Marland Kitchen... Take 1 tablet by mouth once a day  BP today: 140/80 Prior BP: 130/70 (02/13/2010)  Labs Reviewed: K+: 4.6 (05/15/2010) Creat: : 0.90 (05/15/2010)   Chol: 139 (12/27/2009)   HDL: 43 (12/27/2009)   LDL: 74 (12/27/2009)   TG: 109  (12/27/2009)  Problem # 4:  HYPERLIPIDEMIA (ICD-272.4) Assessment: Comment Only  Her updated medication list for this problem includes:    Zetia 10 Mg Tabs (Ezetimibe) ..... One tab by mouth at bedtime    Lipitor 40 Mg Tabs (Atorvastatin calcium) ..... One tab by mouth by mouth at bedtime  Orders: T-Hepatic Function [phone]) T-Lipid Profile [phone])  Labs Reviewed: SGOT: 21 (12/27/2009)   SGPT: 29 (12/27/2009)   HDL:43 (12/27/2009), 50 (08/26/2009)  LDL:74 (12/27/2009), 89 (08/26/2009)  Chol:139 (12/27/2009), 163 (08/26/2009)  Trig:109 (12/27/2009), 119 (08/26/2009)  Problem # 5:  DIABETES MELLITUS, TYPE II (  ICD-250.00) Assessment: Improved  The following medications were removed from the medication list:    Benazepril Hcl 40 Mg Tabs (Benazepril hcl) .Marland Kitchen... Take 1 tablet by mouth once a day Her updated medication list for this problem includes:    Avapro 300 Mg Tabs (Irbesartan) ..... One tab by mouth once daily    Glucophage Xr 500 Mg Xr24h-tab (Metformin hcl) .Marland Kitchen..Marland Kitchen Two tabs by mouth two times a day    Glipizide Xl 10 Mg Xr24h-tab (Glipizide) .Marland Kitchen..Marland Kitchen Two tabs by mouth once daily    Lantus Solostar 100 Unit/ml Soln (Insulin glargine) .Marland KitchenMarland KitchenMarland KitchenMarland Kitchen 50 units at bedtime    Benazepril Hcl 40 Mg Tabs (Benazepril hcl) .Marland Kitchen... Take 1 tablet by mouth once a day  Orders: T- Hemoglobin A1C ([id number])  Labs Reviewed: Creat: 0.90 (05/15/2010)    Reviewed HgBA1c results: 7.0 (05/15/2010)  8.1 (01/03/2010)  Complete Medication List: 1)  Atenolol 50 Mg Tabs (Atenolol) .... One tab by mouth two times a day 2)  Zetia 10 Mg Tabs (Ezetimibe) .... One tab by mouth at bedtime 3)  Avapro 300 Mg Tabs (Irbesartan) .... One tab by mouth once daily 4)  Lipitor 40 Mg Tabs (Atorvastatin calcium) .... One tab by mouth by mouth at bedtime 5)  Glucophage Xr 500 Mg Xr24h-tab (Metformin hcl) .... Two tabs by mouth two times a day 6)  Glipizide Xl 10 Mg Xr24h-tab (Glipizide) .... Two tabs by mouth once  daily 7)  Onetouch Ultra Test Strp (Glucose blood) .... Use as directed twice daily 8)  Onetouch Ultrasoft Lancets Misc (Lancets) .... Use as directed twice daily 9)  Mometasone Furoate 0.1 % Soln (Mometasone furoate) .... Apply topically twice daily 10)  Nitrolingual Duo Pack 0.4 Mg/spray Soln (Nitroglycerin) .... Use 1 spray every  as needed max of 3 sprays in as directed 11)  Clotrimazole-betamethasone 1-0.05 % Crea (Clotrimazole-betamethasone) .... Uad 12)  Novofine 30g X 8 Mm Misc (Insulin pen needle) .... Uad 13)  Lantus Solostar 100 Unit/ml Soln (Insulin glargine) .... 50 units at bedtime 14)  Allegra 180 Mg Tabs (Fexofenadine hcl) .... Take 1 tablet by mouth once a day 15)  Bd Insulin Syringe Ultrafine 29g X 1/2" 1 Ml Misc (Insulin syringe-needle u-100) .... Uad 16)  Benazepril Hcl 40 Mg Tabs (Benazepril hcl) .... Take 1 tablet by mouth once a day 17)  Clobetasol Propionate 0.05 % Crea (Clobetasol propionate) .... Uad 18)  Prednisone (pak) 5 Mg Tabs (Prednisone) .... Use as directed 19)  Ibuprofen 800 Mg Tabs (Ibuprofen) .... Take 1 tablet by mouth three times a day  Patient Instructions: 1)  Please schedule a follow-up appointment in 3 to  3.5 months. 2)  It is important that you exercise regularly at least 40 minutes 5 times a week. If you develop chest pain, have severe difficulty breathing, or feel very tired , stop exercising immediately and seek medical attention. 3)  You need to lose weight. Consider a lower calorie diet and regular exercise. COngrats on weight loss, keep it up pls! 4)  Reduce the lipitor to Mon thru Friday only and stop zetia. 5)  You will get injections forday for the shoulder and also tabs will be sent to your local pharmacy. If no improvement by next week pls call , you will ned to see ortho.  Prescriptions: IBUPROFEN 800 MG TABS (IBUPROFEN) Take 1 tablet by mouth three times a day  #21 x 0   Entered and Authorized by:   Syliva Overman MD  Signed by:   Syliva Overman MD on 05/17/2010   Method used:   Electronically to        [facility]  [address] (retail)       [address]       [place], [place]  [address]       Ph: [phone]       Fax: [phone]   RxID:   [id number] PREDNISONE (PAK) 5 MG TABS (PREDNISONE) Use as directed  #21 x 0   Entered and Authorized by:   Syliva Overman MD   Signed by:   Syliva Overman MD on 05/17/2010   Method used:   Electronically to        [facility]  [address] (retail)       [address]       [place], [place]  [address]       Ph: [phone]       Fax: [phone]   RxID:   [id number]    Medication Administration  Injection # 1:    Medication: Depo- Medrol 80mg     Diagnosis: SHOULDER PAIN, LEFT (ICD-719.41)    Route: IM    Site: RUOQ gluteus    Exp Date: 02/2011    Lot #: [id number]    Mfr: [employer]    Comments: 80mg  given     Patient tolerated injection without complications    Given by: Everitt Amber LPN (May 17, 2010 11:49 AM)  Injection # 2:    Medication: Ketorolac-Toradol 15mg     Diagnosis: SHOULDER PAIN, LEFT (ICD-719.41)    Route: IM    Site: RUOQ gluteus    Exp Date: 12/2010    Lot #: [id number]     Mfr: [email or web address]    Comments: 60mg  given     Patient tolerated injection without complications    Given by: Everitt Amber LPN (May 17, 2010 11:50 AM)  Orders Added: 1)  Est. Patient Level IV [48546] 2)  T-Hepatic Function [80076-22960] 3)  T-Lipid Profile [80061-22930] 4)  T- Hemoglobin A1C [83036-23375] 5)  Depo- Medrol 80mg  [J1040] 6)  Ketorolac-Toradol 15mg  [J1885] 7)  Admin of Therapeutic Inj  intramuscular or subcutaneous [27035]

## 2010-11-21 NOTE — Letter (Signed)
Summary: office notes  office notes   Imported By: Rudene Anda 02/27/2010 14:01:26  _____________________________________________________________________  External Attachment:    Type:   Image     Comment:   External Document

## 2010-11-21 NOTE — Letter (Signed)
Summary: office note  office note   Imported By: Lind Guest 07/20/2010 15:05:12  _____________________________________________________________________  External Attachment:    Type:   Image     Comment:   External Document

## 2010-11-21 NOTE — Letter (Signed)
Summary: Historic Patient demograph  Historic Patient demograph   Imported By: Rudene Anda 02/27/2010 13:50:50  _____________________________________________________________________  External Attachment:    Type:   Image     Comment:   External Document

## 2010-11-21 NOTE — Assessment & Plan Note (Signed)
Summary: physical   Vital Signs:  Patient profile:   70 year old female Menstrual status:  hysterectomy Height:      65.5 inches Weight:      200.75 pounds BMI:     33.02 O2 Sat:      97 % Pulse rate:   67 / minute Pulse rhythm:   regular Resp:     16 per minute BP sitting:   170 / 78  (left arm) Cuff size:   large  Vitals Entered By: Everitt Amber LPN (January 13, 2010 10:33 AM)  Nutrition Counseling: Patient's BMI is greater than 25 and therefore counseled on weight management options. CC: CPE Is Patient Diabetic? No  Vision Screening:Left eye with correction: 20 / 30 Right eye with correction: 20 / 25 Both eyes with correction: 20 / 25  Color vision testing: normal      Vision Entered By: Everitt Amber LPN (January 13, 2010 10:33 AM)   CC:  CPE.  History of Present Illness: Pt reports intolerance to te new formulation of quinapril, stating that is causing nausea andlightheadedness, and she has not been taking it as didligently as she knows that she should. She reports testing daily blood sugars, with excellent numbers on avg 3 tiimes daily, unfortunately her meter is apparently not working, as her labs are very abn. She has concerns about a recurrent right forearm rash which is puritic, it has not responded to topical steroids.She states it initially presents as a red area, no purulent drainage ever.  Denies recent fever or chills. Denies sinus pressure, nasal congestion , ear pain or sore throat. Denies chest congestion, or cough productive of sputum. Denies chest pain, palpitations, PND, orthopnea or leg swelling. Denies abdominal pain, nausea, vomitting, diarrhea or constipation. Denies change in bowel movements or bloody stool. Denies dysuria , frequency, incontinence or hesitancy. Denies  joint pain, swelling, or reduced mobility. Denies headaches, vertigo, seizures. Denies depression, anxiety or insomnia.      Current Medications (verified): 1)  Atenolol 50 Mg   Tabs (Atenolol) .... One Tab By Mouth Two Times A Day 2)  Zetia 10 Mg  Tabs (Ezetimibe) .... One Tab By Mouth At Bedtime 3)  Avapro 300 Mg  Tabs (Irbesartan) .... One Tab By Mouth Once Daily 4)  Lipitor 40 Mg  Tabs (Atorvastatin Calcium) .... One Tab By Mouth By Mouth At Bedtime 5)  Quinapril Hcl 40 Mg  Tabs (Quinapril Hcl) .... One Tab By Mouth Once Daily 6)  Glucophage Xr 500 Mg  Xr24h-Tab (Metformin Hcl) .... Two Tabs By Mouth Two Times A Day 7)  Glipizide Xl 10 Mg  Xr24h-Tab (Glipizide) .... Two Tabs By Mouth Once Daily 8)  Onetouch Ultra Test  Strp (Glucose Blood) .... Use As Directed Twice Daily 9)  Onetouch Ultrasoft Lancets  Misc (Lancets) .... Use As Directed Twice Daily 10)  Mometasone Furoate 0.1 % Soln (Mometasone Furoate) .... Apply Topically Twice Daily 11)  Nitrolingual Duo Pack 0.4 Mg/spray Soln (Nitroglycerin) .... Use 1 Spray Every  As Needed Max of 3 Sprays in As Directed 12)  Clotrimazole-Betamethasone 1-0.05 % Crea (Clotrimazole-Betamethasone) .... Uad 13)  Novofine 30g X 8 Mm Misc (Insulin Pen Needle) .... Uad 14)  Lantus Solostar 100 Unit/ml Soln (Insulin Glargine) .... 50 Units At Bedtime 15)  Allegra 180 Mg Tabs (Fexofenadine Hcl) .... Take 1 Tablet By Mouth Once A Day 16)  Proctocort 30 Mg Supp (Hydrocortisone Acetate) .... Insert 1 Two Times A Day As Needed  17)  Bd Insulin Syringe Ultrafine 29g X 1/2" 1 Ml Misc (Insulin Syringe-Needle U-100) .... Uad  Allergies (verified): 1)  ! Sulfa  Review of Systems      See HPI Eyes:  Denies blurring and discharge. CV:  intolerant of quinapril tabs makes her nauseateed,light headeed. Derm:  Complains of itching and lesion(s); right forearm rcurrent puritic maciulopapular rash , first 2months ago, alcohol  cleared it, recurred in the past 2 weeks, same size and location and sympto. Heme:  Denies abnormal bruising and bleeding. Allergy:  Denies sneezing.  Physical Exam  General:   Well-developed,well-nourished,in no acute distress; alert,appropriate and cooperative throughout examination Head:  Normocephalic and atraumatic without obvious abnormalities. No apparent alopecia or balding. Eyes:  No corneal or conjunctival inflammation noted. EOMI. Perrla. Funduscopic exam benign, without hemorrhages, exudates or papilledema. Vision grossly normal. Ears:  External ear exam shows no significant lesions or deformities.  Otoscopic examination reveals clear canals, tympanic membranes are intact bilaterally without bulging, retraction, inflammation or discharge. Hearing is grossly normal bilaterally. Nose:  External nasal examination shows no deformity or inflammation. Nasal mucosa are pink and moist without lesions or exudates. Mouth:  Oral mucosa and oropharynx without lesions or exudates.  Teeth in good repair. Neck:  No deformities, masses, or tenderness noted. Chest Wall:  No deformities, masses, or tenderness noted. Breasts:  No mass, nodules, thickening, tenderness, bulging, retraction, inflamation, nipple discharge or skin changes noted.   Lungs:  Normal respiratory effort, chest expands symmetrically. Lungs are clear to auscultation, no crackles or wheezes. Heart:  Normal rate and regular rhythm. S1 and S2 normal without gallop, murmur, click, rub or other extra sounds. Abdomen:  Bowel sounds positive,abdomen soft and non-tender without masses, organomegaly or hernias noted. Rectal:  No external abnormalities noted. Normal sphincter tone. No rectal masses or tenderness. Genitalia:  Normal introitus for age, no external lesions, no vaginal discharge,atrophic vaginal walls, uterus absent, no adnexal masses Msk:  No deformity or scoliosis noted of thoracic or lumbar spine.   Pulses:  R and L carotid,radial,femoral,dorsalis pedis and posterior tibial pulses are full and equal bilaterally Extremities:  No clubbing, cyanosis, edema, or deformity noted with normal full range of  motion of all joints.   Neurologic:  No cranial nerve deficits noted. Station and gait are normal. Plantar reflexes are down-going bilaterally. DTRs are symmetrical throughout. Sensory, motor and coordinative functions appear intact. Skin:  Intact hyperpigmented macular rash on right forearm Cervical Nodes:  No lymphadenopathy noted Axillary Nodes:  No palpable lymphadenopathy Inguinal Nodes:  No significant adenopathy Psych:  Cognition and judgment appear intact. Alert and cooperative with normal attention span and concentration. No apparent delusions, illusions, hallucinations  Diabetes Management Exam:    Foot Exam (with socks and/or shoes not present):       Sensory-Monofilament:          Left foot: normal          Right foot: normal       Inspection:          Left foot: normal          Right foot: normal       Nails:          Left foot: normal          Right foot: normal   Impression & Recommendations:  Problem # 1:  SCREENING FOR MALIGNANT NEOPLASM OF THE CERVIX (ICD-V76.2) Assessment Comment Only  Orders: Pap Smear ([address])  Problem # 2:  SPECIAL SCREENING  FOR MALIGNANT NEOPLASMS COLON (ICD-V76.51) Assessment: Comment Only guaic neg stool, no rectal mass Orders: Hemoccult Guaiac-1 spec.(in office) ([id number])  Problem # 3:  DIABETES MELLITUS, TYPE II (ICD-250.00) Assessment: Deteriorated  The following medications were removed from the medication list:    Quinapril Hcl 40 Mg Tabs (Quinapril hcl) ..... One tab by mouth once daily    Cozaar 100 Mg Tabs (Losartan potassium) .Marland Kitchen... Take 1 tablet by mouth once a day Her updated medication list for this problem includes:    Avapro 300 Mg Tabs (Irbesartan) ..... One tab by mouth once daily    Glucophage Xr 500 Mg Xr24h-tab (Metformin hcl) .Marland Kitchen..Marland Kitchen Two tabs by mouth two times a day    Glipizide Xl 10 Mg Xr24h-tab (Glipizide) .Marland Kitchen..Marland Kitchen Two tabs by mouth once daily    Lantus Solostar 100 Unit/ml Soln (Insulin glargine) .Marland KitchenMarland KitchenMarland KitchenMarland Kitchen 50 units at  bedtime    Benazepril Hcl 40 Mg Tabs (Benazepril hcl) .Marland Kitchen... Take 1 tablet by mouth once a day  Labs Reviewed: Creat: 0.83 (12/27/2009)    Reviewed HgBA1c results: 8.1 (01/03/2010)  6.9 (07/28/2009)  Problem # 4:  HYPERTENSION (ICD-401.9) Assessment: Deteriorated  The following medications were removed from the medication list:    Quinapril Hcl 40 Mg Tabs (Quinapril hcl) ..... One tab by mouth once daily    Cozaar 100 Mg Tabs (Losartan potassium) .Marland Kitchen... Take 1 tablet by mouth once a day Her updated medication list for this problem includes:    Atenolol 50 Mg Tabs (Atenolol) ..... One tab by mouth two times a day    Avapro 300 Mg Tabs (Irbesartan) ..... One tab by mouth once daily    Benazepril Hcl 40 Mg Tabs (Benazepril hcl) .Marland Kitchen... Take 1 tablet by mouth once a day  BP today: 170/78 Prior BP: 130/56 (07/28/2009)  Labs Reviewed: K+: 4.4 (12/27/2009) Creat: : 0.83 (12/27/2009)   Chol: 139 (12/27/2009)   HDL: 43 (12/27/2009)   LDL: 74 (12/27/2009)   TG: 109 (12/27/2009)  Complete Medication List: 1)  Atenolol 50 Mg Tabs (Atenolol) .... One tab by mouth two times a day 2)  Zetia 10 Mg Tabs (Ezetimibe) .... One tab by mouth at bedtime 3)  Avapro 300 Mg Tabs (Irbesartan) .... One tab by mouth once daily 4)  Lipitor 40 Mg Tabs (Atorvastatin calcium) .... One tab by mouth by mouth at bedtime 5)  Glucophage Xr 500 Mg Xr24h-tab (Metformin hcl) .... Two tabs by mouth two times a day 6)  Glipizide Xl 10 Mg Xr24h-tab (Glipizide) .... Two tabs by mouth once daily 7)  Onetouch Ultra Test Strp (Glucose blood) .... Use as directed twice daily 8)  Onetouch Ultrasoft Lancets Misc (Lancets) .... Use as directed twice daily 9)  Mometasone Furoate 0.1 % Soln (Mometasone furoate) .... Apply topically twice daily 10)  Nitrolingual Duo Pack 0.4 Mg/spray Soln (Nitroglycerin) .... Use 1 spray every  as needed max of 3 sprays in as directed 11)  Clotrimazole-betamethasone 1-0.05 % Crea  (Clotrimazole-betamethasone) .... Uad 12)  Novofine 30g X 8 Mm Misc (Insulin pen needle) .... Uad 13)  Lantus Solostar 100 Unit/ml Soln (Insulin glargine) .... 50 units at bedtime 14)  Allegra 180 Mg Tabs (Fexofenadine hcl) .... Take 1 tablet by mouth once a day 15)  Proctocort 30 Mg Supp (Hydrocortisone acetate) .... Insert 1 two times a day as needed 16)  Bd Insulin Syringe Ultrafine 29g X 1/2" 1 Ml Misc (Insulin syringe-needle u-100) .... Uad 17)  Benazepril Hcl 40 Mg Tabs (Benazepril hcl) .Marland KitchenMarland KitchenMarland Kitchen  Take 1 tablet by mouth once a day  Other Orders: Dermatology Referral (Derma)  Patient Instructions: 1)  f/U in 5 weeks. 2)  Your blood sugar is uncontrolled ,pls start glipizide 2 every morning, stay on the same doses oflantus and metformin,pls be very careful for blood sugar lows, call if a problem, test 4 times daily including around 2 to 3 am fortr the nex t 2 to 3 weeks, 3)  your meter is not working 4)  bP is high,stop quinapril since it bothers you, stary hyzaar 100mg  once daily 5)    Prescriptions: BENAZEPRIL HCL 40 MG TABS (BENAZEPRIL HCL) Take 1 tablet by mouth once a day  #30 x 2   Entered and Authorized by:   Syliva Overman MD   Signed by:   Syliva Overman MD on 01/14/2010   Method used:   Historical   RxID:   [id number] COZAAR 100 MG TABS (LOSARTAN POTASSIUM) Take 1 tablet by mouth once a day  #90 x 2   Entered by:   Everitt Amber LPN   Authorized by:   Syliva Overman MD   Signed by:   Everitt Amber LPN on 36/64/4034   Method used:   Faxed to ...       [address] [facility] (mail-order)       [address]       [place], [place]  [address]       Ph: [phone]       Fax: [phone]   RxID:   [id number] ATENOLOL 50 MG  TABS (ATENOLOL) one tab by mouth two times a day  #180 x 3   Entered by:   Everitt Amber LPN   Authorized by:   Syliva Overman MD   Signed by:   Everitt Amber LPN on 32/20/2542   Method used:   Faxed to ...       [address] [facility] (mail-order)       [address]       [place], [place]  [address]       Ph: [phone]       Fax: [phone]   RxID:   [id number]   Laboratory Results    Stool - Occult Blood Date: 01/13/2010 Comments: [id number] 8/11 118 1012

## 2010-11-21 NOTE — Miscellaneous (Signed)
Summary: Refill  Clinical Lists Changes  Medications: Changed medication from [facility] ULTRA TEST  STRP (GLUCOSE BLOOD) use as directed twice daily to [facility] ULTRA TEST  STRP (GLUCOSE BLOOD) use as directed twice daily - Signed Rx of ONETOUCH ULTRASOFT LANCETS  MISC (LANCETS) use as directed twice daily;  #180 x 4;  Signed;  Entered by: Everitt Amber LPN;  Authorized by: Syliva Overman MD;  Method used: Historical Rx of ONETOUCH ULTRA TEST  STRP (GLUCOSE BLOOD) use as directed twice daily;  #180 x 3;  Signed;  Entered by: Everitt Amber LPN;  Authorized by: Syliva Overman MD;  Method used: Historical    Prescriptions: [place] TEST  STRP (GLUCOSE BLOOD) use as directed twice daily  #180 x 3   Entered by:   Everitt Amber LPN   Authorized by:   Syliva Overman MD   Signed by:   Everitt Amber LPN on 16/07/9603   Method used:   Historical   RxID:   [id number] ONETOUCH ULTRASOFT LANCETS  MISC (LANCETS) use as directed twice daily  #180 x 4   Entered by:   Everitt Amber LPN   Authorized by:   Syliva Overman MD   Signed by:   Everitt Amber LPN on 21/30/8657   Method used:   Historical   RxID:   [id number]

## 2010-11-21 NOTE — Progress Notes (Signed)
Summary: golf magazines  Phone Note Call from Patient   Summary of Call: patient wanted to know if you still had the golf magazines you gave her on Wednesday she forgot them in the exam room.  thanks Initial call taken by: Curtis Sites,  May 19, 2010 9:25 AM  Follow-up for Phone Call        tell her yes, and give them to her Follow-up by: Syliva Overman MD,  May 19, 2010 9:48 AM  Additional Follow-up for Phone Call Additional follow up Details #1::        called and spoke with patient informed her they were at the front. Additional Follow-up by: Curtis Sites,  May 19, 2010 9:54 AM

## 2010-11-21 NOTE — Letter (Signed)
Summary: lab  lab   Imported By: Rudene Anda 02/27/2010 13:58:07  _____________________________________________________________________  External Attachment:    Type:   Image     Comment:   External Document

## 2010-11-21 NOTE — Letter (Signed)
Summary: misc  misc   Imported By: Rudene Anda 02/27/2010 13:59:20  _____________________________________________________________________  External Attachment:    Type:   Image     Comment:   External Document

## 2010-11-21 NOTE — Assessment & Plan Note (Signed)
Summary: office visit   Vital Signs:  Patient profile:   70 year old female Menstrual status:  hysterectomy Height:      65.5 inches Weight:      197.25 pounds BMI:     32.44 O2 Sat:      98 % Pulse rate:   74 / minute Pulse rhythm:   regular Resp:     16 per minute BP sitting:   130 / 70  (left arm) Cuff size:   large  Vitals Entered By: Everitt Amber LPN (February 13, 2010 2:02 PM)  Nutrition Counseling: Patient's BMI is greater than 25 and therefore counseled on weight management options. CC: Follow up chronic problems, rash and BP   CC:  Follow up chronic problems and rash and BP.  History of Present Illness: Reports  that she is doing much better. Her blood sugars are greatly improved and she denies any symptoms of uncontrolled blood sugars.  She is tolerating her antihypertensive medication well, no adverse side effects are noted. Denies recent fever or chills. Denies sinus pressure, nasal congestion , ear pain or sore throat. Denies chest congestion, or cough productive of sputum. Denies chest pain, palpitations, PND, orthopnea or leg swelling. Denies abdominal pain, nausea, vomitting, diarrhea or constipation. Denies change in bowel movements or bloody stool. Denies dysuria , frequency, incontinence or hesitancy. Denies  joint pain, swelling, or reduced mobility. Denies headaches, vertigo, seizures. Denies depression, anxiety or insomnia. Reports great improvement in her rash with the use of a more potent steroid.     Current Medications (verified): 1)  Atenolol 50 Mg  Tabs (Atenolol) .... One Tab By Mouth Two Times A Day 2)  Zetia 10 Mg  Tabs (Ezetimibe) .... One Tab By Mouth At Bedtime 3)  Avapro 300 Mg  Tabs (Irbesartan) .... One Tab By Mouth Once Daily 4)  Lipitor 40 Mg  Tabs (Atorvastatin Calcium) .... One Tab By Mouth By Mouth At Bedtime 5)  Glucophage Xr 500 Mg  Xr24h-Tab (Metformin Hcl) .... Two Tabs By Mouth Two Times A Day 6)  Glipizide Xl 10 Mg   Xr24h-Tab (Glipizide) .... Two Tabs By Mouth Once Daily 7)  Onetouch Ultra Test  Strp (Glucose Blood) .... Use As Directed Twice Daily 8)  Onetouch Ultrasoft Lancets  Misc (Lancets) .... Use As Directed Twice Daily 9)  Mometasone Furoate 0.1 % Soln (Mometasone Furoate) .... Apply Topically Twice Daily 10)  Nitrolingual Duo Pack 0.4 Mg/spray Soln (Nitroglycerin) .... Use 1 Spray Every  As Needed Max of 3 Sprays in As Directed 11)  Clotrimazole-Betamethasone 1-0.05 % Crea (Clotrimazole-Betamethasone) .... Uad 12)  Novofine 30g X 8 Mm Misc (Insulin Pen Needle) .... Uad 13)  Lantus Solostar 100 Unit/ml Soln (Insulin Glargine) .... 50 Units At Bedtime 14)  Allegra 180 Mg Tabs (Fexofenadine Hcl) .... Take 1 Tablet By Mouth Once A Day 15)  Proctocort 30 Mg Supp (Hydrocortisone Acetate) .... Insert 1 Two Times A Day As Needed 16)  Bd Insulin Syringe Ultrafine 29g X 1/2" 1 Ml Misc (Insulin Syringe-Needle U-100) .... Uad 17)  Benazepril Hcl 40 Mg Tabs (Benazepril Hcl) .... Take 1 Tablet By Mouth Once A Day  Allergies (verified): 1)  ! Sulfa  Review of Systems      See HPI Eyes:  Denies blurring, discharge, and red eye. Derm:  Complains of lesion(s); right foerearm lesion slightly improved on steroid. Endo:  Denies cold intolerance, excessive hunger, excessive thirst, excessive urination, heat intolerance, polyuria, and weight change;  initially had probs with the gllipizide, however has modified her behavior and avd fastings around 120, 2 hrs after a meal is about 150. Heme:  Denies abnormal bruising and bleeding. Allergy:  Denies hives or rash and itching eyes.  Physical Exam  General:  Well-developed,well-nourished,in no acute distress; alert,appropriate and cooperative throughout examination HEENT: No facial asymmetry,  EOMI, No sinus tenderness, TM's Clear, oropharynx  pink and moist.   Chest: Clear to auscultation bilaterally.  CVS: S1, S2, No murmurs, No S3.   Abd: Soft,  Nontender.  MS: Adequate ROM spine, hips, shoulders and knees.  Ext: No edema.   CNS: CN 2-12 intact, power tone and sensation normal throughout.   Skin: Intact, hyperpigmented p[apular lesion on right forearm persits but has reduced slightly in size.  Psych: Good eye contact, normal affect.  Memory intact, not anxious or depressed appearing.   Diabetes Management Exam:    Foot Exam (with socks and/or shoes not present):       Sensory-Monofilament:          Left foot: normal          Right foot: normal       Inspection:          Left foot: normal          Right foot: normal       Nails:          Left foot: normal          Right foot: normal   Impression & Recommendations:  Problem # 1:  DERMATITIS (ICD-692.9) Assessment Improved  Her updated medication list for this problem includes:    Mometasone Furoate 0.1 % Soln (Mometasone furoate) .Marland Kitchen... Apply topically twice daily    Allegra 180 Mg Tabs (Fexofenadine hcl) .Marland Kitchen... Take 1 tablet by mouth once a day  Problem # 2:  OBESITY (ICD-278.00) Assessment: Improved  Ht: 65.5 (02/13/2010)   Wt: 197.25 (02/13/2010)   BMI: 32.44 (02/13/2010)  Problem # 3:  DIABETES MELLITUS, TYPE II (ICD-250.00) Assessment: Improved  Her updated medication list for this problem includes:    Avapro 300 Mg Tabs (Irbesartan) ..... One tab by mouth once daily    Glucophage Xr 500 Mg Xr24h-tab (Metformin hcl) .Marland Kitchen..Marland Kitchen Two tabs by mouth two times a day    Glipizide Xl 10 Mg Xr24h-tab (Glipizide) .Marland Kitchen..Marland Kitchen Two tabs by mouth once daily    Lantus Solostar 100 Unit/ml Soln (Insulin glargine) .Marland KitchenMarland KitchenMarland KitchenMarland Kitchen 50 units at bedtime    Benazepril Hcl 40 Mg Tabs (Benazepril hcl) .Marland Kitchen... Take 1 tablet by mouth once a day  Orders: T- Hemoglobin A1C ([id number])  Labs Reviewed: Creat: 0.83 (12/27/2009)    Reviewed HgBA1c results: 8.1 (01/03/2010)  6.9 (07/28/2009)  Problem # 4:  HYPERTENSION (ICD-401.9) Assessment: Improved  Her updated medication list for this problem  includes:    Atenolol 50 Mg Tabs (Atenolol) ..... One tab by mouth two times a day    Avapro 300 Mg Tabs (Irbesartan) ..... One tab by mouth once daily    Benazepril Hcl 40 Mg Tabs (Benazepril hcl) .Marland Kitchen... Take 1 tablet by mouth once a day  Orders: T-Basic Metabolic Panel [id number])  BP today: 130/70 Prior BP: 170/78 (01/13/2010)  Labs Reviewed: K+: 4.4 (12/27/2009) Creat: : 0.83 (12/27/2009)   Chol: 139 (12/27/2009)   HDL: 43 (12/27/2009)   LDL: 74 (12/27/2009)   TG: 109 (12/27/2009)  Problem # 5:  HYPERLIPIDEMIA (ICD-272.4) Assessment: Unchanged  Her updated medication list for this problem includes:  Zetia 10 Mg Tabs (Ezetimibe) ..... One tab by mouth at bedtime    Lipitor 40 Mg Tabs (Atorvastatin calcium) ..... One tab by mouth by mouth at bedtime  Labs Reviewed: SGOT: 21 (12/27/2009)   SGPT: 29 (12/27/2009)   HDL:43 (12/27/2009), 50 (08/26/2009)  LDL:74 (12/27/2009), 89 (08/26/2009)  Chol:139 (12/27/2009), 163 (08/26/2009)  Trig:109 (12/27/2009), 119 (08/26/2009)  Complete Medication List: 1)  Atenolol 50 Mg Tabs (Atenolol) .... One tab by mouth two times a day 2)  Zetia 10 Mg Tabs (Ezetimibe) .... One tab by mouth at bedtime 3)  Avapro 300 Mg Tabs (Irbesartan) .... One tab by mouth once daily 4)  Lipitor 40 Mg Tabs (Atorvastatin calcium) .... One tab by mouth by mouth at bedtime 5)  Glucophage Xr 500 Mg Xr24h-tab (Metformin hcl) .... Two tabs by mouth two times a day 6)  Glipizide Xl 10 Mg Xr24h-tab (Glipizide) .... Two tabs by mouth once daily 7)  Onetouch Ultra Test Strp (Glucose blood) .... Use as directed twice daily 8)  Onetouch Ultrasoft Lancets Misc (Lancets) .... Use as directed twice daily 9)  Mometasone Furoate 0.1 % Soln (Mometasone furoate) .... Apply topically twice daily 10)  Nitrolingual Duo Pack 0.4 Mg/spray Soln (Nitroglycerin) .... Use 1 spray every  as needed max of 3 sprays in as directed 11)  Clotrimazole-betamethasone 1-0.05 % Crea  (Clotrimazole-betamethasone) .... Uad 12)  Novofine 30g X 8 Mm Misc (Insulin pen needle) .... Uad 13)  Lantus Solostar 100 Unit/ml Soln (Insulin glargine) .... 50 units at bedtime 14)  Allegra 180 Mg Tabs (Fexofenadine hcl) .... Take 1 tablet by mouth once a day 15)  Proctocort 30 Mg Supp (Hydrocortisone acetate) .... Insert 1 two times a day as needed 16)  Bd Insulin Syringe Ultrafine 29g X 1/2" 1 Ml Misc (Insulin syringe-needle u-100) .... Uad 17)  Benazepril Hcl 40 Mg Tabs (Benazepril hcl) .... Take 1 tablet by mouth once a day 18)  Benazepril Hcl 40 Mg Tabs (Benazepril hcl) .... Take 1 tablet by mouth once a day  Other Orders: T-CBC w/Diff [phone]) T-TSH [phone]) T-Vitamin D (25-Hydroxy) [phone])  Patient Instructions: 1)  f/u first week in july. 2)  congrats on improved blood sugar and blood pressure, keep up the exercise and healthy habits with weight loss. 3)  Fasting labs end June Prescriptions: BENAZEPRIL HCL 40 MG TABS (BENAZEPRIL HCL) Take 1 tablet by mouth once a day  #90 x 3   Entered and Authorized by:   Syliva Overman MD   Signed by:   Syliva Overman MD on 02/13/2010   Method used:   Printed then faxed to ...         RxID:   [id number]

## 2010-11-21 NOTE — Letter (Signed)
Summary: consulation  consulation   Imported By: Rudene Anda 02/27/2010 14:00:25  _____________________________________________________________________  External Attachment:    Type:   Image     Comment:   External Document

## 2010-11-21 NOTE — Assessment & Plan Note (Signed)
Summary: flu shot  Nurse Visit   Vitals Entered By: Everitt Amber LPN (July 19, 2010 10:33 AM) Comments Patient in to recieve flu vaccine     Allergies: 1)  ! Sulfa  Orders Added: 1)  Influenza Vaccine MCR [00025]   Influenza Vaccine    Vaccine Type: Fluvax MCR    Site: left deltoid    Mfr: novartis    Dose: 0.5 ml    Route: IM    Given by: Everitt Amber LPN    Exp. Date: 02/2011    Lot #: [id number]

## 2010-11-21 NOTE — Letter (Signed)
Summary: Historic Patient history and physical  Historic Patient history and physical   Imported By: Rudene Anda 02/27/2010 13:53:00  _____________________________________________________________________  External Attachment:    Type:   Image     Comment:   External Document

## 2010-11-21 NOTE — Letter (Signed)
Summary: phone notes  phone notes   Imported By: Rudene Anda 02/27/2010 14:02:21  _____________________________________________________________________  External Attachment:    Type:   Image     Comment:   External Document

## 2010-11-21 NOTE — Progress Notes (Signed)
Summary: pain med worked  Advice worker from Patient   Summary of Call: pt in no pain. and pain meds worked. [phone] Initial call taken by: Rudene Anda,  May 22, 2010 9:01 AM  Follow-up for Phone Call        good to know! Follow-up by: Syliva Overman MD,  May 22, 2010 12:16 PM

## 2010-12-11 ENCOUNTER — Encounter: Payer: Self-pay | Admitting: Family Medicine

## 2010-12-12 ENCOUNTER — Telehealth: Payer: Self-pay | Admitting: Family Medicine

## 2010-12-19 NOTE — Progress Notes (Signed)
Summary: medicines  Phone Note Call from Patient   Summary of Call: [employer] home delivery needs a verbal ok on some medicines atenolol liptor glucophage # [id number] option 3 ref #  [id number] Initial call taken by: Lind Guest,  December 12, 2010 11:26 AM  Follow-up for Phone Call        already have rx forms to fax back in for patient. Will send now Follow-up by: Everitt Amber LPN,  December 12, 2010 12:59 PM     Appended Document: medicines PLEASE ALSO SEND BENAZEPRIL  TO THIS PHARMACY  SAME # AND REFERENCE #  Appended Document: medicines sent to [facility] home[facility]

## 2010-12-19 NOTE — Letter (Signed)
Summary: pharmacy  pharmacy   Imported By: Lind Guest 12/11/2010 11:18:25  _____________________________________________________________________  External Attachment:    Type:   Image     Comment:   External Document

## 2011-01-03 ENCOUNTER — Encounter: Payer: Self-pay | Admitting: Family Medicine

## 2011-01-09 NOTE — Letter (Signed)
Summary: [facility] pharmacy   Imported By: Lind Guest 01/03/2011 16:14:54  _____________________________________________________________________  External Attachment:    Type:   Image     Comment:   External Document

## 2011-01-17 ENCOUNTER — Encounter: Payer: Self-pay | Admitting: Family Medicine

## 2011-01-18 ENCOUNTER — Other Ambulatory Visit: Payer: Self-pay | Admitting: Family Medicine

## 2011-01-18 LAB — HEMOGLOBIN A1C
Hgb A1c MFr Bld: 7 % — ABNORMAL HIGH (ref <5.7)
Mean Plasma Glucose: 154 mg/dL — ABNORMAL HIGH (ref <117)

## 2011-01-18 LAB — HEPATIC FUNCTION PANEL
AST: 28 U/L (ref 0–37)
Albumin: 4.2 g/dL (ref 3.5–5.2)
Total Bilirubin: 0.4 mg/dL (ref 0.3–1.2)

## 2011-01-18 LAB — BASIC METABOLIC PANEL
CO2: 26 mEq/L (ref 19–32)
Calcium: 9.6 mg/dL (ref 8.4–10.5)
Chloride: 110 mEq/L (ref 96–112)
Glucose, Bld: 100 mg/dL — ABNORMAL HIGH (ref 70–99)
Potassium: 4.3 mEq/L (ref 3.5–5.3)
Sodium: 143 mEq/L (ref 135–145)

## 2011-01-18 LAB — LIPID PANEL
HDL: 47 mg/dL (ref >39)
Total CHOL/HDL Ratio: 3.4 Ratio
VLDL: 24 mg/dL (ref 0–40)

## 2011-01-19 ENCOUNTER — Encounter: Payer: Self-pay | Admitting: Family Medicine

## 2011-01-22 ENCOUNTER — Ambulatory Visit (INDEPENDENT_AMBULATORY_CARE_PROVIDER_SITE_OTHER): Payer: Medicare Other | Admitting: Family Medicine

## 2011-01-22 ENCOUNTER — Encounter: Payer: Self-pay | Admitting: Family Medicine

## 2011-01-22 VITALS — BP 120/70 | HR 61 | Resp 16 | Ht 67.0 in | Wt 191.0 lb

## 2011-01-22 DIAGNOSIS — I10 Essential (primary) hypertension: Secondary | ICD-10-CM

## 2011-01-22 DIAGNOSIS — R748 Abnormal levels of other serum enzymes: Secondary | ICD-10-CM

## 2011-01-22 DIAGNOSIS — IMO0001 Reserved for inherently not codable concepts without codable children: Secondary | ICD-10-CM

## 2011-01-22 DIAGNOSIS — E785 Hyperlipidemia, unspecified: Secondary | ICD-10-CM

## 2011-01-22 DIAGNOSIS — H6691 Otitis media, unspecified, right ear: Secondary | ICD-10-CM

## 2011-01-22 DIAGNOSIS — H6690 Otitis media, unspecified, unspecified ear: Secondary | ICD-10-CM

## 2011-01-22 DIAGNOSIS — E119 Type 2 diabetes mellitus without complications: Secondary | ICD-10-CM

## 2011-01-22 MED ORDER — MOMETASONE FUROATE 0.1 % EX SOLN: 1.0000 "application " | CUTANEOUS | Status: DC | PRN

## 2011-01-22 MED ORDER — GLIPIZIDE ER 10 MG PO TB24: 10.0000 mg | ORAL_TABLET | Freq: Every day | ORAL | Status: DC

## 2011-01-22 MED ORDER — PENICILLIN V POTASSIUM 500 MG PO TABS: 500.0000 mg | ORAL_TABLET | Freq: Three times a day (TID) | ORAL | Status: AC

## 2011-01-22 MED ORDER — IRBESARTAN 300 MG PO TABS: 300.0000 mg | ORAL_TABLET | Freq: Every day | ORAL | Status: DC

## 2011-01-22 NOTE — Progress Notes (Signed)
Subjective:    Patient ID: Kimberly Freeman, female    DOB: 09/29/1941, 70 y.o.   MRN: [id number] The PT is here for follow up and re-evaluation of chronic medical conditions, medication management and review of recent lab and radiology data.  Preventive health is updated, specifically  Cancer screening, Osteoporosis screening and Immunization.   Questions or concerns regarding consultations or procedures which the PT has had in the interim are  addressed. The PT denies any adverse reactions to current medications since the last visit.  She does have a new c/o right earache  Otalgia  Associated symptoms include rhinorrhea. Pertinent negatives include no abdominal pain, coughing, diarrhea, headaches, hearing loss, neck pain, rash, sore throat or vomiting.  Diabetes She presents for her follow-up diabetic visit. She has type 2 diabetes mellitus. Pertinent negatives for hypoglycemia include no confusion, dizziness, headaches, nervousness/anxiousness, seizures, speech difficulty, sweats or tremors. (Approx once per month when pt has delayed eating she has gone down to 68 gets shaky) There are no diabetic associated symptoms. Pertinent negatives for diabetes include no blurred vision, no chest pain, no fatigue and no weakness. Pertinent negatives for diabetic complications include no retinopathy. Risk factors for coronary artery disease include diabetes mellitus, dyslipidemia and post-menopausal. Current diabetic treatment includes insulin injections and oral agent (dual therapy). She is compliant with treatment all of the time. She is following a generally healthy, diabetic, low fat/cholesterol, low salt and high fiber diet. There is no change in her home blood glucose trend. Her breakfast blood glucose range is generally 70-90 mg/dl. (Before breakfast gen 80 to 100 and bedtime 120 140) An ACE inhibitor/angiotensin II receptor blocker is being taken. She sees a podiatrist.Eye exam is current.   Hypertension This is a chronic problem. The current episode started more than 1 year ago. The problem has been gradually improving since onset. The problem is controlled. Pertinent negatives include no anxiety, blurred vision, chest pain, headaches, malaise/fatigue, neck pain, palpitations, peripheral edema, PND, shortness of breath or sweats. There are no associated agents to hypertension. Risk factors for coronary artery disease include diabetes mellitus, dyslipidemia and post-menopausal state. Past treatments include ACE inhibitors and angiotensin blockers. The current treatment provides significant improvement. There are no compliance problems.  There is no history of retinopathy.      Review of Systems  Constitutional: Negative for fever, chills, malaise/fatigue, activity change, appetite change, fatigue and unexpected weight change.  HENT: Positive for ear pain ( right ear pain 4  days , has ahd similar prob in the past respoded to topical antibiotic), rhinorrhea, sneezing, postnasal drip and sinus pressure. Negative for hearing loss, congestion, sore throat, trouble swallowing, neck pain and neck stiffness.        Increased and uncontrolled allergy symptoms in the warm months, not new  Eyes: Negative for blurred vision, photophobia, pain, discharge, redness, itching and visual disturbance.  Respiratory: Negative for cough, chest tightness, shortness of breath and wheezing.   Cardiovascular: Negative for chest pain, palpitations, leg swelling and PND.  Gastrointestinal: Negative for nausea, vomiting, abdominal pain, diarrhea, constipation and blood in stool.  Genitourinary: Negative for dysuria, frequency, hematuria and flank pain.  Musculoskeletal: Negative for myalgias, back pain, joint swelling, arthralgias and gait problem.  Skin: Negative for rash and wound.  Neurological: Negative for dizziness, tremors, seizures, speech difficulty, weakness, numbness and headaches.  Hematological:  Negative for adenopathy. Does not bruise/bleed easily.  Psychiatric/Behavioral: Negative for suicidal ideas, hallucinations, behavioral problems, confusion, sleep disturbance and decreased concentration. The  patient is not nervous/anxious and is not hyperactive.        Objective:   Physical Exam  [nursing notereviewed. Constitutional: She is oriented to person, place, and time. She appears well-developed and well-nourished.  HENT:  Head: Normocephalic.  Right Ear: External ear normal.  Left Ear: External ear normal.  Mouth/Throat: No oropharyngeal exudate.       Right tympanic membrane dull, and erythema of the external canal  Eyes: Conjunctivae and EOM are normal. Right eye exhibits no discharge. Left eye exhibits no discharge. No scleral icterus.  Neck: Normal range of motion. Neck supple. No JVD present. No tracheal deviation present. No thyromegaly present.  Cardiovascular: Normal rate, regular rhythm, normal heart sounds and intact distal pulses.   No murmur heard. Pulmonary/Chest: Effort normal and breath sounds normal. No stridor. No respiratory distress. She has no wheezes. She has no rales. She exhibits no tenderness.  Abdominal: Soft. Bowel sounds are normal. There is no tenderness. There is no rebound and no guarding.  Musculoskeletal: Normal range of motion. She exhibits no edema.  Lymphadenopathy:    She has cervical adenopathy ( right posterior cervical adenitis).  Neurological: She is alert and oriented to person, place, and time. No cranial nerve deficit. Coordination normal.  Skin: Skin is warm and dry. No rash noted. No erythema.  Psychiatric: She has a normal mood and affect. Her behavior is normal. Judgment and thought content normal.          Assessment & Plan:  1.Hypertension:Controlled, no changes in medication.  2.IDDM: deteriorated, HBA1C up from 6.8 to 7.0, pt encouraged to commit to per week of exercise, also to limit carbs  more. 3.Hyperlipidemia: improved, but not yet at goal. Need to hold atorvastatin x 3 weeks , then rept liver enzymes due to slight increase 4.Right otitis media, and right posterior cervical adenitis, penicillin prescribed, pt to use topical steroid drop prescribed in the past for approx 5 days

## 2011-01-22 NOTE — Progress Notes (Signed)
Addended by: Adella Hare on: 01/22/2011 01:42 PM   Modules accepted: Orders

## 2011-01-22 NOTE — Patient Instructions (Signed)
F/U in 4 months.  AST and ALT in 3 weeks.(non fasting)  Stop atorvastatin  Until you have your labs repeated in 3 weeks.  HBA1C , lipid and hepatic and chem 7 in fasting in 4 month   Increase exercise to 150 mins/week at least.  Penicillin is being prescribed for your right ear infection , use the drops as before for 5 to 7 days.  No other medication changes.

## 2011-01-22 NOTE — Progress Notes (Signed)
HAS APPOINTMENT TODAY.

## 2011-01-22 NOTE — Progress Notes (Signed)
Addended by: Adella Hare on: 01/22/2011 04:43 PM   Modules accepted: Orders, Medications

## 2011-01-23 MED ORDER — GLIPIZIDE ER 10 MG PO TB24: 10.0000 mg | ORAL_TABLET | Freq: Every day | ORAL | Status: DC

## 2011-01-23 MED ORDER — IRBESARTAN 300 MG PO TABS: 300.0000 mg | ORAL_TABLET | Freq: Every day | ORAL | Status: DC

## 2011-01-23 NOTE — Progress Notes (Signed)
Had a appointment 4.2.12

## 2011-01-23 NOTE — Progress Notes (Signed)
Addended by: Adella Hare on: 01/23/2011 03:00 PM   Modules accepted: Orders, Medications

## 2011-03-09 NOTE — H&P (Signed)
   NAME:  Kimberly Freeman, Kimberly Freeman                          ACCOUNT NO.:  [id number]   MEDICAL RECORD NO.:  [id number]                   PATIENT TYPE:   LOCATION:                                       FACILITY:  [facility]   PHYSICIAN:  Dirk Dress. Katrinka Blazing, M.D.                DATE OF BIRTH:  May 24, 1941   DATE OF ADMISSION:  DATE OF DISCHARGE:                                HISTORY & PHYSICAL   HISTORY OF PRESENT ILLNESS:  [age]-year-old female with a history of  pain in the right flank and back.  She has had postprandial nausea with  occasional episode of vomiting.  She has not had diarrhea.  On evaluation,  ultrasound revealed stone in the common bile duct with questionable stone in  the gallbladder, HIDA scan with 50% ejection fraction.  The patient has been  treated for H. pylori gastritis.  She is felt to have cholelithiasis with  cholecystitis, with possible common duct stone.  She is scheduled for  cholecystectomy with cholangiogram.   PAST HISTORY:  She has non-insulin-dependent diabetes mellitus,  hypertension, hyperlipidemia.   PAST SURGICAL HISTORY:  Hysterectomy.   MEDICATIONS:  1. Accupril 20 mg daily.  2. Glucotrol XL 10 mg two daily.  3. Glucophage 500 mg b.i.d.  4. Atenolol 50 mg daily.  5. Lipitor 40 mg daily.  6. Benicar 400 mg daily.   PHYSICAL EXAMINATION:  VITAL SIGNS: On exam, blood pressure is 132/78, pulse  76, respirations 16.  Weight 201 pounds.  HEENT:  Unremarkable.  There is no jaundice.  NECK:  Neck is supple without JVD or bruit.  CHEST:  Chest clear to auscultation.  HEART:  Regular rate and rhythm without murmur, gallop or rub.  ABDOMEN:  Mild right upper quadrant and right flank tenderness.  No  epigastric tenderness.  Normal bowel sounds.  EXTREMITIES:  No cyanosis, clubbing or edema.  NEUROLOGIC:  No motor, sensory or cerebellar deficit.  Cranial nerves  intact.  Deep tendon reflexes intact.   IMPRESSION:  1. Suspected cholelithiasis, chronic  cholecystitis with possible common bile     duct stone.  2. Helicobacter pylori gastritis, treated.  3.     Hypertension.  4. Non-insulin-dependent diabetes mellitus.   PLAN:  The patient will have a laparoscopic cholecystectomy with  cholangiogram.                                               Jerolyn Shin C. Katrinka Blazing, M.D.    LCS/MEDQ  D:  11/23/2002  T:  11/24/2002  Job:  [id number]

## 2011-03-09 NOTE — Op Note (Signed)
NAME:  ANALYSIA, DUNGEE                          ACCOUNT NO.:  [id number]   MEDICAL RECORD NO.:  [id number]                   PATIENT TYPE:  AMB   LOCATION:  DAY                                  FACILITY:  [facility]   PHYSICIAN:  Jerolyn Shin C. Katrinka Blazing, M.D.                DATE OF BIRTH:  July 19, 1941   DATE OF PROCEDURE:  11/24/2002  DATE OF DISCHARGE:                                 OPERATIVE REPORT   PREOPERATIVE DIAGNOSES:  Chronic cholecystitis, possible common bile duct  stone.   POSTOPERATIVE DIAGNOSIS:  Chronic cholecystitis.   PROCEDURE:  Laparoscopic cholecystectomy with cholangiogram.   SURGEON:  Jerolyn Shin C. Katrinka Blazing, M.D.   DESCRIPTION OF PROCEDURE:  Under general anesthesia, the patient's abdomen  was prepped and draped in a sterile field.  A supraumbilical incision was  made.  The Veress needle was inserted uneventfully.  Abdomen was insufflated  with 2.5 liters of CO2.  Using a Visiport guide, a 10 mm port was placed  without difficulty.  Laparoscope was placed.  Under videoscopic guidance, a  10 mm and two 5 mm ports were placed in the right upper quadrant.  The  gallbladder was grasped and positioned.  Cystic duct was dissected.  The  duct was clamped close to its junction with the gallbladder.  Incision was  made in the duct, and a cholangiocatheter was placed transabdominally.  After it was positioned in the duct and the balloon was insufflated.  There  was no evidence of leak.  Cystic duct cholangiogram was done, and it  revealed normal caliber common bile duct, common hepatic duct, and  intrahepatic radicles with no filling defects and free flow into the  duodenum through a very smooth, tapered papilla.  The catheter was removed,  and the cystic duct was clipped with five clips and divided.  The cystic  artery was clipped with three clips and divided.  The gallbladder was  separated from the intrahepatic space without difficulty using  electrocautery.  The gallbladder was  retrieved intact.  The patient  tolerated the procedure well.  There was no bleeding from the need and no  bile leak.  Irrigation was carried out without difficulty.  CO2 was allowed  to escape from the abdomen, and the ports were removed.  The incisions were  closed using 0 Dexon on the fascia, on the larger incisions, and staples on  the skin.  The patient tolerated the procedure well.  Dressings were placed.  She was awakened from anesthesia uneventfully, transferred to her bed, and  taken to the postanesthetic care unit.                                               Dirk Dress. Katrinka Blazing, M.D.    LCS/MEDQ  D:  11/24/2002  T:  11/24/2002  Job:  [id number]

## 2011-03-09 NOTE — H&P (Signed)
NAME:  Kimberly Freeman, Kimberly Freeman                ACCOUNT NO.:  [id number]   MEDICAL RECORD NO.:  [id number]          PATIENT TYPE:  AMB   LOCATION:                                FACILITY:  [facility]   PHYSICIAN:  Dalia Heading, M.D.  DATE OF BIRTH:  Aug 15, 1941   DATE OF ADMISSION:  DATE OF DISCHARGE:  LH                                HISTORY & PHYSICAL   CHIEF COMPLAINT:  Need for screening colonoscopy, epigastric pain.   HISTORY OF PRESENT ILLNESS:  The patient is a 70 year old black female who  is referred for endoscopic evaluation.  She needs a colonoscopy and EGD for  screening and intractable epigastric pain.  The patient has been having  indigestion and heartburn for some time now.  She is status post  cholecystectomy in the past.  No weight loss, nausea, vomiting, diarrhea,  constipation, melena, hematochezia have been noted.  She last had a  colonoscopy 5 years ago.  There is no immediate family history of colon  carcinoma.   PAST MEDICAL HISTORY:  Includes hypertension, non-insulin-dependent diabetes  mellitus   PAST SURGICAL HISTORY:  Cholecystectomy, hysterectomy.   CURRENT MEDICATIONS:  Glucophage, Glucotrol, Lipitor, Zetia, Accupril,  Avapro, atenolol, cyclobenzaprine   ALLERGIES:  No known drug allergies.   REVIEW OF SYSTEMS:  The patient denies drinking or smoking.   PHYSICAL EXAMINATION:  GENERAL:  The patient is a well-developed, well-  nourished black female in no acute distress.  LUNGS:  Clear to auscultation with equal breath sounds bilaterally.  HEART: Examination reveals regular rate and rhythm without S3, S4, murmurs.  The abdomen is soft, nontender, nondistended.  No hepatosplenomegaly or  masses noted.  RECTAL:  Examination was deferred to the procedure.   IMPRESSION:  Need for screening colonoscopy, epigastric pain.   PLAN:  The patient is scheduled for an EGD and colonoscopy on May 14, 2006.  Risks and benefits of the procedure including bleeding and  perforation were  fully explained to the patient, gave informed consent.      Dalia Heading, M.D.  Electronically Signed     MAJ/MEDQ  D:  05/07/2006  T:  05/07/2006  Job:  [id number]   cc:   [facility]  Fax: [phone]   Milus Mallick. Lodema Hong, M.D.  Fax: [phone]

## 2011-03-20 ENCOUNTER — Encounter: Payer: Self-pay | Admitting: Family Medicine

## 2011-03-21 ENCOUNTER — Encounter: Payer: Self-pay | Admitting: Family Medicine

## 2011-03-21 ENCOUNTER — Ambulatory Visit: Payer: Self-pay | Admitting: Family Medicine

## 2011-05-23 ENCOUNTER — Encounter: Payer: Self-pay | Admitting: Family Medicine

## 2011-05-24 ENCOUNTER — Encounter: Payer: Self-pay | Admitting: Family Medicine

## 2011-05-25 ENCOUNTER — Encounter: Payer: Self-pay | Admitting: Family Medicine

## 2011-05-25 ENCOUNTER — Ambulatory Visit (INDEPENDENT_AMBULATORY_CARE_PROVIDER_SITE_OTHER): Payer: Medicare Other | Admitting: Family Medicine

## 2011-05-25 VITALS — BP 120/64 | HR 71 | Resp 16 | Ht 67.0 in | Wt 192.4 lb

## 2011-05-25 DIAGNOSIS — I10 Essential (primary) hypertension: Secondary | ICD-10-CM

## 2011-05-25 DIAGNOSIS — M1990 Unspecified osteoarthritis, unspecified site: Secondary | ICD-10-CM | POA: Insufficient documentation

## 2011-05-25 DIAGNOSIS — E785 Hyperlipidemia, unspecified: Secondary | ICD-10-CM

## 2011-05-25 DIAGNOSIS — E119 Type 2 diabetes mellitus without complications: Secondary | ICD-10-CM

## 2011-05-25 DIAGNOSIS — M129 Arthropathy, unspecified: Secondary | ICD-10-CM

## 2011-05-25 LAB — HEPATIC FUNCTION PANEL
ALT: 21 U/L (ref 0–35)
Alkaline Phosphatase: 72 U/L (ref 39–117)
Indirect Bilirubin: 0.3 mg/dL (ref 0.0–0.9)
Total Protein: 6.9 g/dL (ref 6.0–8.3)

## 2011-05-25 LAB — BASIC METABOLIC PANEL
BUN: 14 mg/dL (ref 6–23)
Creat: 0.94 mg/dL (ref 0.50–1.10)
Potassium: 4.2 mEq/L (ref 3.5–5.3)

## 2011-05-25 LAB — LIPID PANEL
Cholesterol: 157 mg/dL (ref 0–200)
Triglycerides: 106 mg/dL (ref <150)

## 2011-05-25 MED ORDER — INSULIN GLARGINE 100 UNIT/ML ~~LOC~~ SOLN: SUBCUTANEOUS | Status: DC

## 2011-05-25 MED ORDER — GLUCOSE BLOOD VI STRP: ORAL_STRIP | Status: DC

## 2011-05-25 MED ORDER — ATORVASTATIN CALCIUM 40 MG PO TABS: 40.0000 mg | ORAL_TABLET | Freq: Every day | ORAL | Status: DC

## 2011-05-25 MED ORDER — GLIPIZIDE ER 10 MG PO TB24: 10.0000 mg | ORAL_TABLET | Freq: Every day | ORAL | Status: DC

## 2011-05-25 MED ORDER — METFORMIN HCL ER 500 MG PO TB24: 1000.0000 mg | ORAL_TABLET | Freq: Two times a day (BID) | ORAL | Status: DC

## 2011-05-25 MED ORDER — ONETOUCH ULTRASOFT LANCETS MISC: Status: DC

## 2011-05-25 MED ORDER — ATENOLOL 50 MG PO TABS: 50.0000 mg | ORAL_TABLET | Freq: Two times a day (BID) | ORAL | Status: DC

## 2011-05-25 MED ORDER — MELOXICAM 15 MG PO TABS: 15.0000 mg | ORAL_TABLET | Freq: Every day | ORAL | Status: DC

## 2011-05-25 MED ORDER — INSULIN GLARGINE 100 UNIT/ML ~~LOC~~ SOLN: 55.0000 [IU] | Freq: Every day | SUBCUTANEOUS | Status: DC

## 2011-05-25 MED ORDER — IRBESARTAN 300 MG PO TABS: 300.0000 mg | ORAL_TABLET | Freq: Every day | ORAL | Status: DC

## 2011-05-25 MED ORDER — NITROGLYCERIN 0.4 MG/SPRAY TL SOLN: 1.0000 | Status: DC | PRN

## 2011-05-25 NOTE — Patient Instructions (Signed)
F/u early December.  hBA1c    In 4 months.  Blood sugar is not as well controlled, so dose increase on levimir to 55 units daily.  It is important that you exercise regularly at least 30 minutes 5 times a week. If you develop chest pain, have severe difficulty breathing, or feel very tired, stop exercising immediately and seek medical attention    A healthy diet is rich in fruit, vegetables and whole grains. Poultry fish, nuts and beans are a healthy choice for protein rather then red meat. A low sodium diet and drinking 64 ounces of water daily is generally recommended. Oils and sweet should be limited. Carbohydrates especially for those who are diabetic or overweight, should be limited to 34-45 gram per meal. It is important to eat on a regular schedule, at least 3 times daily. Snacks should be primarily fruits, vegetables or nuts.  pls sched eye exam.  Mammo due 9/27 or after , pls sched   New med for arthritis, mobic, ok to take with tylenol

## 2011-05-27 NOTE — Progress Notes (Signed)
  Subjective:    Patient ID: Kimberly Freeman, female    DOB: 1941-02-04, 70 y.o.   MRN: [id number]  HPI The PT is here for follow up and re-evaluation of chronic medical conditions, medication management and review of any available recent lab and radiology data.  Preventive health is updated, specifically  Cancer screening and Immunization.   Questions or concerns regarding consultations or procedures which the PT has had in the interim are  addressed. The PT denies any adverse reactions to current medications since the last visit.  There are no new concerns.  C/o increased arthritic pain in shoulders espescialy wants treatment option other than tylenol only  HYPERTENSION  Disease Monitoring  Blood pressure range-120 to 130/80 Chest pain- no      Dyspnea- no  Compliance- good Lightheadedness- no   Edema- no  DIABETES  Disease Monitoring  Blood Sugar ranges-fastings avg 130 to 140 Polyuria- no New Visual problems- no  Compliance- good  Hypoglycemic symptoms- no  Last eye exam: Summer 2011, currently due, pt to schedule      Podiatry visit: every 3 to 4 months  HYPERLIPIDEMIA  Compliance- good RUQ pain- no  Muscle aches- no   REGULAR EXERCISE-yes        Review of Systems See HPI Denies recent fever or chills. Denies sinus pressure, nasal congestion, ear pain or sore throat. Denies chest congestion, productive cough or wheezing. Denies chest pains, palpitations and leg swelling Denies abdominal pain, nausea, vomiting,diarrhea or constipation.   Denies dysuria, frequency, hesitancy or incontinence.  Denies headaches, seizures, numbness, or tingling. Denies depression, anxiety or insomnia. Denies skin break down or rash.        Objective:   Physical Exam  Patient alert and oriented and in no cardiopulmonary distress.  HEENT: No facial asymmetry, EOMI, no sinus tenderness,  oropharynx pink and moist.  Neck supple no adenopathy.  Chest: Clear to auscultation  bilaterally.  CVS: S1, S2 no murmurs, no S3.  ABD: Soft non tender. Bowel sounds normal.  Ext: No edema  MS: Adequate ROM spine, shoulders, hips and reduced in knees.  Skin: Intact, no ulcerations or rash noted.  Psych: Good eye contact, normal affect. Memory intact not anxious or depressed appearing.  CNS: CN 2-12 intact, power, tone and sensation normal throughout.       Assessment & Plan:

## 2011-05-27 NOTE — Assessment & Plan Note (Signed)
Uncontrolled with tylenol alone, mobic added

## 2011-05-27 NOTE — Assessment & Plan Note (Addendum)
Uncontrolled, dose inc in lantus and more diligence wtth diet and exercise

## 2011-05-27 NOTE — Assessment & Plan Note (Signed)
Controlled, no change in medication  

## 2011-06-04 ENCOUNTER — Telehealth: Payer: Self-pay | Admitting: Family Medicine

## 2011-06-05 MED ORDER — NITROGLYCERIN 0.4 MG/SPRAY TL SOLN: 1.0000 | Status: DC | PRN

## 2011-06-05 NOTE — Telephone Encounter (Signed)
Called in the pumpspray to the pharmacy

## 2011-06-05 NOTE — Telephone Encounter (Signed)
This med has been sent. 

## 2011-07-19 ENCOUNTER — Other Ambulatory Visit: Payer: Self-pay | Admitting: Family Medicine

## 2011-07-19 DIAGNOSIS — Z139 Encounter for screening, unspecified: Secondary | ICD-10-CM

## 2011-07-24 ENCOUNTER — Ambulatory Visit (HOSPITAL_COMMUNITY): Payer: Medicare Other

## 2011-07-26 ENCOUNTER — Ambulatory Visit (HOSPITAL_COMMUNITY): Payer: Medicare Other

## 2011-07-26 ENCOUNTER — Ambulatory Visit (HOSPITAL_COMMUNITY)
Admission: RE | Admit: 2011-07-26 | Discharge: 2011-07-26 | Disposition: A | Discharge status: Home / Self Care | Payer: Medicare Other | Source: Ambulatory Visit | Attending: Family Medicine | Admitting: Family Medicine

## 2011-07-26 DIAGNOSIS — Z1231 Encounter for screening mammogram for malignant neoplasm of breast: Secondary | ICD-10-CM | POA: Insufficient documentation

## 2011-07-26 DIAGNOSIS — Z139 Encounter for screening, unspecified: Secondary | ICD-10-CM

## 2011-07-31 ENCOUNTER — Ambulatory Visit: Payer: Medicare Other

## 2011-08-01 ENCOUNTER — Ambulatory Visit (INDEPENDENT_AMBULATORY_CARE_PROVIDER_SITE_OTHER): Payer: Medicare Other

## 2011-08-01 DIAGNOSIS — Z23 Encounter for immunization: Secondary | ICD-10-CM

## 2011-09-28 ENCOUNTER — Encounter: Payer: Self-pay | Admitting: Family Medicine

## 2011-10-04 ENCOUNTER — Ambulatory Visit (INDEPENDENT_AMBULATORY_CARE_PROVIDER_SITE_OTHER): Payer: Medicare Other | Admitting: Family Medicine

## 2011-10-04 ENCOUNTER — Encounter: Payer: Self-pay | Admitting: Family Medicine

## 2011-10-04 VITALS — BP 128/74 | HR 83 | Resp 18 | Ht 67.0 in | Wt 192.0 lb

## 2011-10-04 DIAGNOSIS — I10 Essential (primary) hypertension: Secondary | ICD-10-CM

## 2011-10-04 DIAGNOSIS — G8929 Other chronic pain: Secondary | ICD-10-CM | POA: Insufficient documentation

## 2011-10-04 DIAGNOSIS — M949 Disorder of cartilage, unspecified: Secondary | ICD-10-CM

## 2011-10-04 DIAGNOSIS — M25519 Pain in unspecified shoulder: Secondary | ICD-10-CM

## 2011-10-04 DIAGNOSIS — R5381 Other malaise: Secondary | ICD-10-CM

## 2011-10-04 DIAGNOSIS — M899 Disorder of bone, unspecified: Secondary | ICD-10-CM

## 2011-10-04 DIAGNOSIS — E119 Type 2 diabetes mellitus without complications: Secondary | ICD-10-CM

## 2011-10-04 DIAGNOSIS — E785 Hyperlipidemia, unspecified: Secondary | ICD-10-CM

## 2011-10-04 DIAGNOSIS — M25512 Pain in left shoulder: Secondary | ICD-10-CM

## 2011-10-04 LAB — HEMOGLOBIN A1C: Hgb A1c MFr Bld: 6.1 % — ABNORMAL HIGH (ref <5.7)

## 2011-10-04 MED ORDER — BENAZEPRIL HCL 40 MG PO TABS: ORAL_TABLET | ORAL | Status: DC

## 2011-10-04 MED ORDER — DICLOFENAC SODIUM 1 % TD GEL: TRANSDERMAL | Status: DC

## 2011-10-04 NOTE — Progress Notes (Signed)
  Subjective:    Patient ID: Kimberly Freeman, female    DOB: [birth year], 70 y.o.   MRN: [id number]  HPI The PT is here for follow up and re-evaluation of chronic medical conditions, medication management and review of any available recent lab and radiology data.  Preventive health is updated, specifically  Cancer screening and Immunization.   Questions or concerns regarding consultations or procedures which the PT has had in the interim are  addressed. The PT denies any adverse reactions to current medications since the last visit.  C/o increased left shoulder pain with reduced mobility x 2 months, no trauma to  Denies polyuria , polydipsia or blurred vision, test daily, at times blood sugar in the 70's      Review of Systems See HPI Denies recent fever or chills. Denies sinus pressure, nasal congestion, ear pain or sore throat. Denies chest congestion, productive cough or wheezing. Denies chest pains, palpitations and leg swelling Denies abdominal pain, nausea, vomiting,diarrhea or constipation.   Denies dysuria, frequency, hesitancy or incontinence.  Denies headaches, seizures, numbness, or tingling. Denies depression, anxiety or insomnia. Denies skin break down or rash.        Objective:   Physical Exam Patient alert and oriented and in no cardiopulmonary distress.  HEENT: No facial asymmetry, EOMI, no sinus tenderness,  oropharynx pink and moist.  Neck supple no adenopathy.  Chest: Clear to auscultation bilaterally.  CVS: S1, S2 no murmurs, no S3.  ABD: Soft non tender. Bowel sounds normal.  Ext: No edema  MS: Adequate ROM spine,, hips and knees.decreased in left shoulder  Skin: Intact, no ulcerations or rash noted.  Psych: Good eye contact, normal affect. Memory intact not anxious or depressed appearing.  CNS: CN 2-12 intact, power, tone and sensation normal throughout. Diabetic Foot Check:  Appearance -  calluses Skin - no unusual pallor or  redness Sensation - grossly intact to light touch Monofilament testing -  Right - Great toe, medial, central, lateral ball and posterior foot decreased  Left - Great toe, medial, central, lateral ball and posterior foot decreased Pulses Left - Dorsalis Pedis and Posterior Tibia normal Right - Dorsalis Pedis and Posterior Tibia normal         Assessment & Plan:

## 2011-10-04 NOTE — Patient Instructions (Addendum)
F/u in 3.5 months.  Reduce glipizide to ONE tablet ONCE daily, continue other medication for diabetes as before. Your blood sugar iss much better, though I believe it is a bit too low.  Call if uncontrolled, allow 3 to 4 weeks   Morning range 90 to 115  Bedtime 130 to 170  Fasting lipid, cmp, EGFR, HBA1C, CBC, vitD,, TSH in 3.5 months.(early April) New med voltaren gel for shoulder pain

## 2011-10-05 LAB — MICROALBUMIN / CREATININE URINE RATIO
Microalb Creat Ratio: 6.6 mg/g (ref 0.0–30.0)
Microalb, Ur: 1.28 mg/dL (ref 0.00–1.89)

## 2011-10-07 NOTE — Assessment & Plan Note (Signed)
Deteriorated, topical anti inflammatory

## 2011-10-07 NOTE — Assessment & Plan Note (Signed)
Over corrected, dose reduction in glipizide to one daily, has been taking twice daily

## 2011-10-07 NOTE — Assessment & Plan Note (Signed)
Controlled, no change in medication  

## 2011-10-07 NOTE — Assessment & Plan Note (Signed)
Hyperlipidemia:Low fat diet discussed and encouraged.  Controlled, no change in medication   

## 2011-11-15 DIAGNOSIS — E1149 Type 2 diabetes mellitus with other diabetic neurological complication: Secondary | ICD-10-CM | POA: Diagnosis not present

## 2011-11-15 DIAGNOSIS — E119 Type 2 diabetes mellitus without complications: Secondary | ICD-10-CM | POA: Diagnosis not present

## 2012-01-22 DIAGNOSIS — E119 Type 2 diabetes mellitus without complications: Secondary | ICD-10-CM | POA: Diagnosis not present

## 2012-01-22 DIAGNOSIS — E785 Hyperlipidemia, unspecified: Secondary | ICD-10-CM | POA: Diagnosis not present

## 2012-01-22 DIAGNOSIS — M899 Disorder of bone, unspecified: Secondary | ICD-10-CM | POA: Diagnosis not present

## 2012-01-22 DIAGNOSIS — R5381 Other malaise: Secondary | ICD-10-CM | POA: Diagnosis not present

## 2012-01-23 ENCOUNTER — Ambulatory Visit (INDEPENDENT_AMBULATORY_CARE_PROVIDER_SITE_OTHER): Payer: Medicare Other | Admitting: Family Medicine

## 2012-01-23 ENCOUNTER — Encounter: Payer: Self-pay | Admitting: Family Medicine

## 2012-01-23 VITALS — BP 136/72 | HR 80 | Resp 18 | Ht 67.0 in | Wt 195.0 lb

## 2012-01-23 DIAGNOSIS — E119 Type 2 diabetes mellitus without complications: Secondary | ICD-10-CM

## 2012-01-23 DIAGNOSIS — I10 Essential (primary) hypertension: Secondary | ICD-10-CM

## 2012-01-23 DIAGNOSIS — E785 Hyperlipidemia, unspecified: Secondary | ICD-10-CM | POA: Diagnosis not present

## 2012-01-23 LAB — CBC WITH DIFFERENTIAL/PLATELET
Basophils Absolute: 0 10*3/uL (ref 0.0–0.1)
Basophils Relative: 1 % (ref 0–1)
Eosinophils Relative: 3 % (ref 0–5)
HCT: 38.6 % (ref 36.0–46.0)
Hemoglobin: 12.1 g/dL (ref 12.0–15.0)
MCH: 29.7 pg (ref 26.0–34.0)
MCHC: 31.3 g/dL (ref 30.0–36.0)
MCV: 94.8 fL (ref 78.0–100.0)
Monocytes Absolute: 0.5 10*3/uL (ref 0.1–1.0)
Monocytes Relative: 9 % (ref 3–12)
Neutro Abs: 2.1 10*3/uL (ref 1.7–7.7)
RDW: 13.5 % (ref 11.5–15.5)

## 2012-01-23 LAB — TSH: TSH: 3.333 u[IU]/mL (ref 0.350–4.500)

## 2012-01-23 LAB — HEMOGLOBIN A1C: Mean Plasma Glucose: 146 mg/dL — ABNORMAL HIGH (ref <117)

## 2012-01-23 LAB — COMPLETE METABOLIC PANEL WITH GFR
ALT: 27 U/L (ref 0–35)
AST: 22 U/L (ref 0–37)
Albumin: 4.4 g/dL (ref 3.5–5.2)
CO2: 26 mEq/L (ref 19–32)
Calcium: 10.2 mg/dL (ref 8.4–10.5)
Chloride: 107 mEq/L (ref 96–112)
Creat: 0.87 mg/dL (ref 0.50–1.10)
GFR, Est African American: 77 mL/min
Potassium: 4.2 mEq/L (ref 3.5–5.3)
Sodium: 142 mEq/L (ref 135–145)
Total Protein: 6.6 g/dL (ref 6.0–8.3)

## 2012-01-23 LAB — LIPID PANEL
HDL: 43 mg/dL (ref >39)
LDL Cholesterol: 74 mg/dL (ref 0–99)

## 2012-01-23 MED ORDER — BENAZEPRIL HCL 40 MG PO TABS: ORAL_TABLET | ORAL | Status: DC

## 2012-01-23 MED ORDER — IRBESARTAN 300 MG PO TABS: 300.0000 mg | ORAL_TABLET | Freq: Every day | ORAL | Status: DC

## 2012-01-23 NOTE — Patient Instructions (Signed)
F/u in 4.5 months.  Call if you need me before  Congrats on excellent labs and blood pressure, no medication changes at this time.  It is important that you exercise regularly at least 30 minutes 5 times a week. If you develop chest pain, have severe difficulty breathing, or feel very tired, stop exercising immediately and seek medical attention    Weight loss goal of 5 pounds

## 2012-01-23 NOTE — Progress Notes (Signed)
  Subjective:    Patient ID: Kimberly Freeman, female    DOB: 1941/10/13, 71 y.o.   MRN: [id number]  HPI The PT is here for follow up and re-evaluation of chronic medical conditions, medication management and review of any available recent lab and radiology data.  Preventive health is updated, specifically  Cancer screening and Immunization.   Questions or concerns regarding consultations or procedures which the PT has had in the interim are  Addressed.Has upcoming appt with podiatry, and eye exam s up to date The PT denies any adverse reactions to current medications since the last visit.  There are no new concerns.  There are no specific complaints. Tests daily and fasting sugars are seldom over 130      Review of Systems See HPI Denies recent fever or chills. Denies sinus pressure, nasal congestion, ear pain or sore throat. Denies chest congestion, productive cough or wheezing. Denies chest pains, palpitations and leg swelling Denies abdominal pain, nausea, vomiting,diarrhea or constipation.   Denies dysuria, frequency, hesitancy or incontinence. Denies joint pain, swelling and limitation in mobility. Denies headaches, seizures, numbness, or tingling. Denies depression, anxiety or insomnia. Denies skin break down or rash.        Objective:   Physical Exam   Patient alert and oriented and in no cardiopulmonary distress.  HEENT: No facial asymmetry, EOMI, no sinus tenderness,  oropharynx pink and moist.  Neck supple no adenopathy.  Chest: Clear to auscultation bilaterally.  CVS: S1, S2 no murmurs, no S3.  ABD: Soft non tender. Bowel sounds normal.  Ext: No edema  MS: Adequate ROM spine, shoulders, hips and knees.  Skin: Intact, no ulcerations or rash noted.  Psych: Good eye contact, normal affect. Memory intact not anxious or depressed appearing.  CNS: CN 2-12 intact, power, tone and sensation normal throughout. Diabetic Foot Check:  Appearance - no lesions,  ulcers or calluses Skin - no unusual pallor or redness Sensation - grossly intact to light touch Monofilament testing -  Right - Great toe, medial, central, lateral ball and posterior foot intact Left - Great toe, medial, central, lateral ball and posterior foot intact Pulses Left - Dorsalis Pedis and Posterior Tibia normal Right - Dorsalis Pedis and Posterior Tibia normal      Assessment & Plan:

## 2012-01-27 NOTE — Assessment & Plan Note (Signed)
Controlled, no change in medication  

## 2012-02-07 DIAGNOSIS — E119 Type 2 diabetes mellitus without complications: Secondary | ICD-10-CM | POA: Diagnosis not present

## 2012-02-07 DIAGNOSIS — E1149 Type 2 diabetes mellitus with other diabetic neurological complication: Secondary | ICD-10-CM | POA: Diagnosis not present

## 2012-03-28 ENCOUNTER — Encounter (HOSPITAL_COMMUNITY): Payer: Self-pay | Admitting: *Deleted

## 2012-03-28 ENCOUNTER — Emergency Department (HOSPITAL_COMMUNITY)
Admission: EM | Admit: 2012-03-28 | Discharge: 2012-03-28 | Disposition: A | Discharge status: Home / Self Care | Payer: Medicare Other | Attending: Emergency Medicine | Admitting: Emergency Medicine

## 2012-03-28 DIAGNOSIS — K219 Gastro-esophageal reflux disease without esophagitis: Secondary | ICD-10-CM | POA: Diagnosis not present

## 2012-03-28 DIAGNOSIS — I10 Essential (primary) hypertension: Secondary | ICD-10-CM | POA: Insufficient documentation

## 2012-03-28 DIAGNOSIS — L255 Unspecified contact dermatitis due to plants, except food: Secondary | ICD-10-CM | POA: Diagnosis not present

## 2012-03-28 DIAGNOSIS — E785 Hyperlipidemia, unspecified: Secondary | ICD-10-CM | POA: Diagnosis not present

## 2012-03-28 DIAGNOSIS — R21 Rash and other nonspecific skin eruption: Secondary | ICD-10-CM | POA: Diagnosis not present

## 2012-03-28 DIAGNOSIS — L0390 Cellulitis, unspecified: Secondary | ICD-10-CM

## 2012-03-28 DIAGNOSIS — E119 Type 2 diabetes mellitus without complications: Secondary | ICD-10-CM | POA: Insufficient documentation

## 2012-03-28 DIAGNOSIS — Z882 Allergy status to sulfonamides status: Secondary | ICD-10-CM | POA: Diagnosis not present

## 2012-03-28 DIAGNOSIS — T622X1A Toxic effect of other ingested (parts of) plant(s), accidental (unintentional), initial encounter: Secondary | ICD-10-CM | POA: Insufficient documentation

## 2012-03-28 DIAGNOSIS — L237 Allergic contact dermatitis due to plants, except food: Secondary | ICD-10-CM

## 2012-03-28 DIAGNOSIS — IMO0002 Reserved for concepts with insufficient information to code with codable children: Secondary | ICD-10-CM | POA: Diagnosis not present

## 2012-03-28 LAB — GLUCOSE, CAPILLARY: Glucose-Capillary: 186 mg/dL — ABNORMAL HIGH (ref 70–99)

## 2012-03-28 MED ORDER — DIPHENHYDRAMINE HCL 50 MG/ML IJ SOLN: 25.0000 mg | Freq: Once | INTRAMUSCULAR | Status: DC

## 2012-03-28 MED ORDER — DIPHENHYDRAMINE HCL 25 MG PO CAPS
25.0000 mg | ORAL_CAPSULE | Freq: Once | ORAL | Status: AC
  Administered 2012-03-28: 25 mg via ORAL
  Filled 2012-03-28: qty 1

## 2012-03-28 MED ORDER — DOXYCYCLINE HYCLATE 100 MG PO CAPS: 100.0000 mg | ORAL_CAPSULE | Freq: Two times a day (BID) | ORAL | Status: AC

## 2012-03-28 MED ORDER — HYDROCORTISONE 1 % EX CREA: TOPICAL_CREAM | CUTANEOUS | Status: DC

## 2012-03-28 MED ORDER — DIPHENHYDRAMINE HCL 25 MG PO TABS: 25.0000 mg | ORAL_TABLET | Freq: Four times a day (QID) | ORAL | Status: DC

## 2012-03-28 MED ORDER — DIPHENHYDRAMINE HCL 25 MG PO TABS: 25.0000 mg | ORAL_TABLET | Freq: Four times a day (QID) | ORAL | Status: AC

## 2012-03-28 MED ORDER — CEPHALEXIN 500 MG PO CAPS: 500.0000 mg | ORAL_CAPSULE | Freq: Four times a day (QID) | ORAL | Status: AC

## 2012-03-28 MED ORDER — DOXYCYCLINE HYCLATE 100 MG PO TABS
100.0000 mg | ORAL_TABLET | Freq: Once | ORAL | Status: AC
  Administered 2012-03-28: 100 mg via ORAL
  Filled 2012-03-28: qty 1

## 2012-03-28 MED ORDER — CEPHALEXIN 500 MG PO CAPS: 500.0000 mg | ORAL_CAPSULE | Freq: Four times a day (QID) | ORAL | Status: DC

## 2012-03-28 MED ORDER — DOXYCYCLINE HYCLATE 100 MG PO CAPS: 100.0000 mg | ORAL_CAPSULE | Freq: Two times a day (BID) | ORAL | Status: DC

## 2012-03-28 MED ORDER — HYDROCORTISONE 1 % EX CREA: TOPICAL_CREAM | CUTANEOUS | Status: AC

## 2012-03-28 MED ORDER — CEPHALEXIN 500 MG PO CAPS
500.0000 mg | ORAL_CAPSULE | Freq: Once | ORAL | Status: AC
  Administered 2012-03-28: 500 mg via ORAL
  Filled 2012-03-28: qty 1

## 2012-03-28 NOTE — Discharge Instructions (Signed)
Cellulitis Take the antibiotics as prescribed. Follow up with Dr. Lodema Hong on Monday.  Return to the ED if you develop worsening redness, swelling, or any other concerning symptoms. Cellulitis is an infection of the skin and the tissue beneath it. The area is typically red and tender. It is caused by germs (bacteria) (usually staph or strep) that enter the body through cuts or sores. Cellulitis most commonly occurs in the arms or lower legs.  HOME CARE INSTRUCTIONS   If you are given a prescription for medications which kill germs (antibiotics), take as directed until finished.   If the infection is on the arm or leg, keep the limb elevated as able.   Use a warm cloth several times per day to relieve pain and encourage healing.   See your caregiver for recheck of the infected site as directed if problems arise.   Only take over-the-counter or prescription medicines for pain, discomfort, or fever as directed by your caregiver.  SEEK MEDICAL CARE IF:   The area of redness (inflammation) is spreading, there are red streaks coming from the infected site, or if a part of the infection begins to turn dark in color.   The joint or bone underneath the infected skin becomes painful after the skin has healed.   The infection returns in the same or another area after it seems to have gone away.   A boil or bump swells up. This may be an abscess.   New, unexplained problems such as pain or fever develop.  SEEK IMMEDIATE MEDICAL CARE IF:   You have a fever.   You or your child feels drowsy or lethargic.   There is vomiting, diarrhea, or lasting discomfort or feeling ill (malaise) with muscle aches and pains.  MAKE SURE YOU:   Understand these instructions.   Will watch your condition.   Will get help right away if you are not doing well or get worse.  Document Released: 07/18/2005 Document Revised: 09/27/2011 Document Reviewed: 05/26/2008 [facility] Patient Information 2012 [place],  [place]. Poison [employer] ivy is a inflammation of the skin (contact dermatitis) caused by touching the allergens on the leaves of the ivy plant following previous exposure to the plant. The rash usually appears 48 hours after exposure. The rash is usually bumps (papules) or blisters (vesicles) in a linear pattern. Depending on your own sensitivity, the rash may simply cause redness and itching, or it may also progress to blisters which may break open. These must be well cared for to prevent secondary bacterial (germ) infection, followed by scarring. Keep any open areas dry, clean, dressed, and covered with an antibacterial ointment if needed. The eyes may also get puffy. The puffiness is worst in the morning and gets better as the day progresses. This dermatitis usually heals without scarring, within 2 to 3 weeks without treatment. HOME CARE INSTRUCTIONS  Thoroughly wash with soap and water as soon as you have been exposed to poison ivy. You have about one half hour to remove the plant resin before it will cause the rash. This washing will destroy the oil or antigen on the skin that is causing, or will cause, the rash. Be sure to wash under your fingernails as any plant resin there will continue to spread the rash. Do not rub skin vigorously when washing affected area. Poison ivy cannot spread if no oil from the plant remains on your body. A rash that has progressed to weeping sores will not spread the rash unless you have  not washed thoroughly. It is also important to wash any clothes you have been wearing as these may carry active allergens. The rash will return if you wear the unwashed clothing, even several days later. Avoidance of the plant in the future is the best measure. Poison ivy plant can be recognized by the number of leaves. Generally, poison ivy has three leaves with flowering branches on a single stem. Diphenhydramine may be purchased over the counter and used as needed for itching. Do not drive  with this medication if it makes you drowsy.Ask your caregiver about medication for children. SEEK MEDICAL CARE IF: Open sores develop.  Redness spreads beyond area of rash.  You notice purulent (pus-like) discharge.  You have increased pain.  Other signs of infection develop (such as fever).  Document Released: 10/05/2000 Document Revised: 09/27/2011 Document Reviewed: 08/24/2009 [facility] Patient Information 2012 [place], [place].

## 2012-03-28 NOTE — ED Notes (Signed)
Pt has rash to right forearm. States that it had started drying up but started spreading again today. Pt alert and oriented x 3. Skin warm and dry. Color pink. No drainage noted.

## 2012-03-28 NOTE — ED Notes (Signed)
Pt c/o poison ivy to right arm, pt states that she was exposed last Saturday while cutting her bushes.  pt states that it is not getting any better,

## 2012-03-28 NOTE — ED Provider Notes (Signed)
History   This chart was scribed for Glynn Octave, MD by Charolett Bumpers . The patient was seen in room APFT20/APFT20.    CSN: [id number]  Arrival date & time 03/28/12  1408   First MD Initiated Contact with Patient 03/28/12 1535      No chief complaint on file.   (Consider location/radiation/quality/duration/timing/severity/associated sxs/prior treatment) HPI Kimberly Freeman is a 71 y.o. female who presents to the Emergency Department complaining of constant, moderate poison ivy on her forearms for the past week. Patient states that she has had exposure to poison ivy in her garden. Patient states that her rash was improving, but worsened today with swelling. Patient reports associated redness, swelling and itching. Patient denies any drainage or pain. Patient states that she has tried calamine lotion, benadryl and Tylenol for her symptoms with moderate relief. Pt has a h/o poison ivy. Patient states that she has a h/o diabetes.   PCP: Dr. Lodema Hong  Past Medical History  Diagnosis Date  . GERD (gastroesophageal reflux disease)   . Obesity   . Hyperlipidemia   . Diabetes mellitus, type 2   . Hypertension     Past Surgical History  Procedure Date  . Abdominal hysterectomy     Family History  Problem Relation Age of Onset  . Leukemia Mother   . Cancer Mother   . Prostate cancer Father   . Lung cancer Sister   . Diabetes Sister   . Cancer Sister   . Diabetes Sister     History  Substance Use Topics  . Smoking status: Never Smoker   . Smokeless tobacco: Not on file  . Alcohol Use: No    OB History    Grav Para Term Preterm Abortions TAB SAB Ect Mult Living                  Review of Systems  Constitutional: Negative for fever.  Gastrointestinal: Negative for vomiting.  Skin: Positive for rash.  All other systems reviewed and are negative.    Allergies  Sulfonamide derivatives  Home Medications   Current Outpatient Rx  Name Route Sig Dispense  Refill  . ACETAMINOPHEN 500 MG PO TABS Oral Take 500 mg by mouth as needed. For pain    . ASPIRIN 325 MG PO TABS Oral Take 325 mg by mouth every morning.    . ATENOLOL 50 MG PO TABS Oral Take 1 tablet (50 mg total) by mouth 2 (two) times daily. 180 tablet 1  . ATORVASTATIN CALCIUM 40 MG PO TABS Oral Take 40 mg by mouth at bedtime.    Marland Kitchen BENAZEPRIL HCL 40 MG PO TABS Oral Take 40 mg by mouth every morning. Take one tab daily    . GLIPIZIDE ER 10 MG PO TB24 Oral Take 10 mg by mouth every morning.    Marland Kitchen GLUCOSE BLOOD VI STRP  (Onetouch Ultrablue ) Use as instructed for twice daily testing 300 each 1  . INSULIN GLARGINE 100 UNIT/ML [place] SOLN Subcutaneous Inject 40 Units into the skin at bedtime. *inject 55 units ONLY if sugar levels are elevated*    . INSULIN PEN NEEDLE 30G X 8 MM MISC Subcutaneous Inject 1 packet into the skin as directed.      . IRBESARTAN 300 MG PO TABS Oral Take 1 tablet (300 mg total) by mouth daily. 90 tablet 1  . ONETOUCH ULTRASOFT LANCETS MISC  Use as instructed for twice daily testing 300 each 1  . METFORMIN HCL ER 500  MG PO TB24 Oral Take 1,000 mg by mouth 2 (two) times daily. *BRAND ONLY*    . NITROGLYCERIN 0.4 MG/SPRAY TL SOLN Sublingual Place 1 spray under the tongue every 5 (five) minutes as needed for chest pain. 12 g 1    BP 184/71  Pulse 81  Temp(Src) 97.4 F (36.3 C) (Oral)  Resp 20  Ht 5\' 7"  (1.702 m)  Wt 190 lb (86.183 kg)  BMI 29.76 kg/m2  SpO2 98%  Physical Exam  Nursing note and vitals reviewed. Constitutional: She is oriented to person, place, and time. She appears well-developed and well-nourished. No distress.  HENT:  Head: Normocephalic and atraumatic.  Eyes: EOM are normal. Pupils are equal, round, and reactive to light.  Neck: Neck supple. No tracheal deviation present.  Cardiovascular: Normal rate.   Pulmonary/Chest: Effort normal. No respiratory distress.  Abdominal: Soft. She exhibits no distension.  Musculoskeletal: Normal range of motion.  She exhibits no edema.  Neurological: She is alert and oriented to person, place, and time. No sensory deficit.  Skin: Skin is warm and dry. Rash noted. Rash is vesicular.          Vesicular rash to right palmar forearm. Surrounding erythema of proximal ulnar forearm. No pain with movement of elbow. +2 radial pulse, cardinal hand movements intact.  Psychiatric: She has a normal mood and affect. Her behavior is normal.    ED Course  Procedures (including critical care time)  DIAGNOSTIC STUDIES: Oxygen Saturation is 98% on room air, normal by my interpretation.    COORDINATION OF CARE:  1541: Discussed planned course of treatment with the patient, who is agreeable at this time. Discussed f/u with Dr. Lodema Hong and discussed strict return precautions. 1545: Medication Orders: Doxycycline (Vibra-tabs) tablet 100 mg-once; Cephalexin (Keflex) capsule 500 mg-once 1600: Medication Orders: Diphenhydramine (Benadryl) capsule 25 mg-once    Labs Reviewed  GLUCOSE, CAPILLARY - Abnormal; Notable for the following:    Glucose-Capillary 186 (*)    All other components within normal limits   No results found.   No diagnosis found.    MDM  Itchy rash to right forearm after being exposed to poison ivy 5 days ago. Increase redness and swelling today. No fever, vomiting.  Hx DM.  Rash consistent with contact dermatitis from poison ivy.  There is slight surrounding erythema so will place on prophylactic antibiotics for early cellulitis. Recheck by Dr. Lodema Hong on Monday.   I personally performed the services described in this documentation, which was scribed in my presence.  The recorded information has been reviewed and considered.      Glynn Octave, MD 03/28/12 2330

## 2012-03-31 ENCOUNTER — Ambulatory Visit (INDEPENDENT_AMBULATORY_CARE_PROVIDER_SITE_OTHER): Payer: Medicare Other | Admitting: Family Medicine

## 2012-03-31 ENCOUNTER — Encounter: Payer: Self-pay | Admitting: Family Medicine

## 2012-03-31 VITALS — BP 124/72 | HR 84 | Resp 18 | Ht 67.0 in | Wt 193.1 lb

## 2012-03-31 DIAGNOSIS — L255 Unspecified contact dermatitis due to plants, except food: Secondary | ICD-10-CM | POA: Diagnosis not present

## 2012-03-31 DIAGNOSIS — IMO0002 Reserved for concepts with insufficient information to code with codable children: Secondary | ICD-10-CM

## 2012-03-31 DIAGNOSIS — L0291 Cutaneous abscess, unspecified: Secondary | ICD-10-CM | POA: Diagnosis not present

## 2012-03-31 DIAGNOSIS — Z23 Encounter for immunization: Secondary | ICD-10-CM | POA: Diagnosis not present

## 2012-03-31 DIAGNOSIS — L0390 Cellulitis, unspecified: Secondary | ICD-10-CM

## 2012-03-31 DIAGNOSIS — I10 Essential (primary) hypertension: Secondary | ICD-10-CM | POA: Diagnosis not present

## 2012-03-31 DIAGNOSIS — E119 Type 2 diabetes mellitus without complications: Secondary | ICD-10-CM

## 2012-03-31 DIAGNOSIS — L237 Allergic contact dermatitis due to plants, except food: Secondary | ICD-10-CM

## 2012-03-31 MED ORDER — PREDNISONE (PAK) 5 MG PO TABS: 5.0000 mg | ORAL_TABLET | ORAL | Status: DC

## 2012-03-31 MED ORDER — METHYLPREDNISOLONE ACETATE 80 MG/ML IJ SUSP
80.0000 mg | Freq: Once | INTRAMUSCULAR | Status: AC
  Administered 2012-03-31: 80 mg via INTRAMUSCULAR

## 2012-03-31 MED ORDER — CEFTRIAXONE SODIUM 1 G IJ SOLR
500.0000 mg | Freq: Once | INTRAMUSCULAR | Status: AC
  Administered 2012-03-31: 500 mg via INTRAMUSCULAR

## 2012-03-31 NOTE — Patient Instructions (Addendum)
F/u as before.  You have cellulitis of the right forearm and an allergic reaction presumably to poison oak.  STAY away from the bushes please, just admire them  Complete all antibiotics from the ED as prescribed.  Rocephin 500mg  IM and Depomedrol 60mg  IM are administered at this visit.  Please keep area clean and dry, and do NOT scratch.  Improvement should continue, if symptoms are worsening pls call, also by next week you should be 100% better  TdAp in office today

## 2012-03-31 NOTE — Assessment & Plan Note (Signed)
Will administer TdAp today

## 2012-03-31 NOTE — Progress Notes (Signed)
  Subjective:    Patient ID: Kimberly Freeman, female    DOB: 02-10-1941, 71 y.o.   MRN: [id number]  HPI Pt exposed to poison oak last week Wednesday in her yard, started  Having swelling of forearm and itching the following day which worsened necessitating an ED visit. She has cellulitis of the forearm on the right which is improving slowly. Currently  On benadryl q6 hours, also doxycycline and keflex.She has also obtained a laceration in the area where she has scratched excessively. Denies fever , chills or uncontrolled blood sugars. Some improvement since starting antibiotics, but a lot of the area is still red and raw, and very pruritic   Review of Systems See HPI Denies recent fever or chills. Denies sinus pressure, nasal congestion, ear pain or sore throat. Denies chest congestion, productive cough or wheezing. Denies chest pains, palpitations and leg swelling Denies abdominal pain, nausea, vomiting,diarrhea or constipation.   Denies dysuria, frequency, hesitancy or incontinence. Denies joint pain, swelling and limitation in mobility. Denies headaches, seizures, numbness, or tingling. Denies depression, anxiety or insomnia.       Objective:   Physical Exam Patient alert and oriented and in no cardiopulmonary distress.  HEENT: No facial asymmetry, EOMI, no sinus tenderness,  oropharynx pink and moist.  Neck supple no adenopathy.  Chest: Clear to auscultation bilaterally.  CVS: S1, S2 no murmurs, no S3.  ABD: Soft non tender. Bowel sounds normal.  Ext: No edema  MS: Adequate ROM spine, shoulders, hips and knees.  Skin: erythematous maculopapular rash on right arm and forearm, with open laceration and multiple scratch marks area also weeping  Psych: Good eye contact, normal affect. Memory intact not anxious or depressed appearing.  CNS: CN 2-12 intact, power, tone and sensation normal throughout.        Assessment & Plan:

## 2012-04-06 NOTE — Assessment & Plan Note (Signed)
Severe allergic reaction with supoerinfection additional antibiotic IM in the office and steroid dose pack

## 2012-04-06 NOTE — Assessment & Plan Note (Signed)
Controlled, but advised pt that due to steroids , she may see a spike in blood sugars short term

## 2012-04-06 NOTE — Assessment & Plan Note (Signed)
Controlled, no change in medication  

## 2012-04-07 ENCOUNTER — Telehealth: Payer: Self-pay | Admitting: Family Medicine

## 2012-04-07 NOTE — Telephone Encounter (Signed)
Advise since almost there keep taking medication as prescribed

## 2012-04-10 NOTE — Telephone Encounter (Signed)
Patient aware.

## 2012-04-17 DIAGNOSIS — E1149 Type 2 diabetes mellitus with other diabetic neurological complication: Secondary | ICD-10-CM | POA: Diagnosis not present

## 2012-04-17 DIAGNOSIS — E119 Type 2 diabetes mellitus without complications: Secondary | ICD-10-CM | POA: Diagnosis not present

## 2012-05-05 ENCOUNTER — Other Ambulatory Visit: Payer: Self-pay

## 2012-05-05 MED ORDER — METFORMIN HCL ER 500 MG PO TB24: 1000.0000 mg | ORAL_TABLET | Freq: Two times a day (BID) | ORAL | Status: DC

## 2012-05-05 MED ORDER — CLOTRIMAZOLE-BETAMETHASONE 1-0.05 % EX CREA: TOPICAL_CREAM | CUTANEOUS | Status: DC

## 2012-05-05 MED ORDER — GLIPIZIDE ER 10 MG PO TB24: 10.0000 mg | ORAL_TABLET | ORAL | Status: DC

## 2012-05-14 ENCOUNTER — Ambulatory Visit (INDEPENDENT_AMBULATORY_CARE_PROVIDER_SITE_OTHER): Payer: Medicare Other | Admitting: Family Medicine

## 2012-05-14 ENCOUNTER — Encounter: Payer: Self-pay | Admitting: Family Medicine

## 2012-05-14 VITALS — BP 140/74 | HR 71 | Resp 18 | Ht 67.0 in | Wt 189.0 lb

## 2012-05-14 DIAGNOSIS — I10 Essential (primary) hypertension: Secondary | ICD-10-CM

## 2012-05-14 DIAGNOSIS — R51 Headache: Secondary | ICD-10-CM

## 2012-05-14 DIAGNOSIS — R110 Nausea: Secondary | ICD-10-CM

## 2012-05-14 DIAGNOSIS — E785 Hyperlipidemia, unspecified: Secondary | ICD-10-CM

## 2012-05-14 DIAGNOSIS — E119 Type 2 diabetes mellitus without complications: Secondary | ICD-10-CM

## 2012-05-14 MED ORDER — ONDANSETRON HCL 4 MG/2ML IJ SOLN
4.0000 mg | Freq: Once | INTRAMUSCULAR | Status: AC
  Administered 2012-05-14: 4 mg via INTRAMUSCULAR

## 2012-05-14 MED ORDER — IBUPROFEN 800 MG PO TABS: ORAL_TABLET | ORAL | Status: DC

## 2012-05-14 MED ORDER — PROMETHAZINE HCL 12.5 MG PO TABS: 12.5000 mg | ORAL_TABLET | Freq: Three times a day (TID) | ORAL | Status: DC | PRN

## 2012-05-14 MED ORDER — KETOROLAC TROMETHAMINE 60 MG/2ML IJ SOLN
60.0000 mg | Freq: Once | INTRAMUSCULAR | Status: AC
  Administered 2012-05-14: 60 mg via INTRAMUSCULAR

## 2012-05-14 NOTE — Progress Notes (Signed)
  Subjective:    Patient ID: Kimberly Freeman, female    DOB: Jan 01, 1941, 71 y.o.   MRN: [id number]  HPI Pt was awakened from her sleep this morning with a 10 plus pounding frontal headache associated with nausea and 3 episodes of emesis.Has attempted to keep a peach down most  Recently. No previous headache history, no associated weakness, numbness, neck stiffnes, blurred vision or difficulty with speech  Review of Systems Controlled, no change in medication Denies recent fever or chills. Denies sinus pressure, nasal congestion, ear pain or sore throat. Denies chest congestion, productive cough or wheezing. Denies chest pains, palpitations and leg swelling Denies abdominal pain, nausea, vomiting,diarrhea or constipation.   Denies dysuria, frequency, hesitancy or incontinence. Denies joint pain, swelling and limitation in mobility.  Denies depression, anxiety or insomnia. Denies skin break down or rash.        Objective:   Physical Exam Patient alert and oriented and in no cardiopulmonary distress.  HEENT: No facial asymmetry, EOMI, no sinus tenderness,  oropharynx pink and moist.  Neck supple no adenopathy.  Chest: Clear to auscultation bilaterally.  CVS: S1, S2 no murmurs, no S3.  ABD: Soft non tender. Bowel sounds normal.  Ext: No edema  MS: Adequate ROM spine, shoulders, hips and knees.  Skin: Intact, no ulcerations or rash noted.  Psych: Good eye contact, normal affect. Memory intact not anxious or depressed appearing.  CNS: CN 2-12 intact, power, tone and sensation normal throughout.        Assessment & Plan:

## 2012-05-14 NOTE — Patient Instructions (Addendum)
F/u as before.  Your headache sounds most like migraine equivalent but since you have no previous headache history  You are referred for a scan of your brain and to see a neurologist  Medication is sent to your local pharmacy and injections are given in the office toradol and zofran.  Start ibuprofen tomorrow and take for 5 days

## 2012-05-14 NOTE — Assessment & Plan Note (Signed)
Toradol 60 mg Im and zofran 4mg  in the office

## 2012-05-15 ENCOUNTER — Other Ambulatory Visit: Payer: Self-pay

## 2012-05-15 DIAGNOSIS — R110 Nausea: Secondary | ICD-10-CM

## 2012-05-15 MED ORDER — PROMETHAZINE HCL 12.5 MG PO TABS: 12.5000 mg | ORAL_TABLET | Freq: Three times a day (TID) | ORAL | Status: DC | PRN

## 2012-05-15 MED ORDER — IBUPROFEN 800 MG PO TABS: ORAL_TABLET | ORAL | Status: DC

## 2012-05-18 NOTE — Assessment & Plan Note (Signed)
Hyperlipidemia:Low fat diet discussed and encouraged.  Controlled, no change in medication   

## 2012-05-18 NOTE — Assessment & Plan Note (Signed)
Acute nausea associated with headache, zofran in office today

## 2012-05-18 NOTE — Assessment & Plan Note (Signed)
Controlled, no change in medication  

## 2012-05-26 ENCOUNTER — Ambulatory Visit (HOSPITAL_COMMUNITY)
Admission: RE | Admit: 2012-05-26 | Discharge: 2012-05-26 | Disposition: A | Discharge status: Home / Self Care | Payer: Medicare Other | Source: Ambulatory Visit | Attending: Family Medicine | Admitting: Family Medicine

## 2012-05-26 DIAGNOSIS — R51 Headache: Secondary | ICD-10-CM | POA: Insufficient documentation

## 2012-06-16 DIAGNOSIS — I10 Essential (primary) hypertension: Secondary | ICD-10-CM | POA: Diagnosis not present

## 2012-06-16 DIAGNOSIS — R51 Headache: Secondary | ICD-10-CM | POA: Diagnosis not present

## 2012-06-24 ENCOUNTER — Other Ambulatory Visit: Payer: Self-pay | Admitting: Family Medicine

## 2012-06-24 DIAGNOSIS — Z139 Encounter for screening, unspecified: Secondary | ICD-10-CM

## 2012-07-07 ENCOUNTER — Ambulatory Visit: Payer: Medicare Other | Admitting: Family Medicine

## 2012-07-28 ENCOUNTER — Ambulatory Visit (HOSPITAL_COMMUNITY)
Admission: RE | Admit: 2012-07-28 | Discharge: 2012-07-28 | Disposition: A | Discharge status: Home / Self Care | Payer: Medicare Other | Source: Ambulatory Visit | Attending: Family Medicine | Admitting: Family Medicine

## 2012-07-28 DIAGNOSIS — Z1231 Encounter for screening mammogram for malignant neoplasm of breast: Secondary | ICD-10-CM | POA: Diagnosis not present

## 2012-07-28 DIAGNOSIS — Z139 Encounter for screening, unspecified: Secondary | ICD-10-CM

## 2012-07-31 ENCOUNTER — Ambulatory Visit (INDEPENDENT_AMBULATORY_CARE_PROVIDER_SITE_OTHER): Payer: Medicare Other

## 2012-07-31 DIAGNOSIS — Z23 Encounter for immunization: Secondary | ICD-10-CM | POA: Diagnosis not present

## 2012-07-31 DIAGNOSIS — E119 Type 2 diabetes mellitus without complications: Secondary | ICD-10-CM | POA: Diagnosis not present

## 2012-07-31 DIAGNOSIS — E1149 Type 2 diabetes mellitus with other diabetic neurological complication: Secondary | ICD-10-CM | POA: Diagnosis not present

## 2012-08-28 ENCOUNTER — Other Ambulatory Visit: Payer: Self-pay | Admitting: Family Medicine

## 2012-08-28 DIAGNOSIS — E785 Hyperlipidemia, unspecified: Secondary | ICD-10-CM | POA: Diagnosis not present

## 2012-08-28 DIAGNOSIS — E119 Type 2 diabetes mellitus without complications: Secondary | ICD-10-CM | POA: Diagnosis not present

## 2012-08-28 DIAGNOSIS — I10 Essential (primary) hypertension: Secondary | ICD-10-CM | POA: Diagnosis not present

## 2012-08-28 LAB — HEMOGLOBIN A1C
Hgb A1c MFr Bld: 7.3 % — ABNORMAL HIGH (ref <5.7)
Mean Plasma Glucose: 163 mg/dL — ABNORMAL HIGH (ref <117)

## 2012-08-29 ENCOUNTER — Ambulatory Visit (INDEPENDENT_AMBULATORY_CARE_PROVIDER_SITE_OTHER): Payer: Medicare Other | Admitting: Family Medicine

## 2012-08-29 ENCOUNTER — Encounter: Payer: Self-pay | Admitting: Family Medicine

## 2012-08-29 VITALS — BP 140/80 | HR 65 | Resp 15 | Ht 67.0 in | Wt 193.0 lb

## 2012-08-29 DIAGNOSIS — E669 Obesity, unspecified: Secondary | ICD-10-CM

## 2012-08-29 DIAGNOSIS — E119 Type 2 diabetes mellitus without complications: Secondary | ICD-10-CM | POA: Diagnosis not present

## 2012-08-29 DIAGNOSIS — N390 Urinary tract infection, site not specified: Secondary | ICD-10-CM

## 2012-08-29 DIAGNOSIS — E785 Hyperlipidemia, unspecified: Secondary | ICD-10-CM | POA: Diagnosis not present

## 2012-08-29 DIAGNOSIS — Z23 Encounter for immunization: Secondary | ICD-10-CM

## 2012-08-29 DIAGNOSIS — I10 Essential (primary) hypertension: Secondary | ICD-10-CM

## 2012-08-29 LAB — BASIC METABOLIC PANEL
Glucose, Bld: 133 mg/dL — ABNORMAL HIGH (ref 70–99)
Potassium: 4.3 mEq/L (ref 3.5–5.3)
Sodium: 141 mEq/L (ref 135–145)

## 2012-08-29 LAB — HEPATIC FUNCTION PANEL
AST: 17 U/L (ref 0–37)
Albumin: 4.2 g/dL (ref 3.5–5.2)
Alkaline Phosphatase: 75 U/L (ref 39–117)
Bilirubin, Direct: 0.1 mg/dL (ref 0.0–0.3)
Total Bilirubin: 0.3 mg/dL (ref 0.3–1.2)

## 2012-08-29 LAB — POCT URINALYSIS DIPSTICK
Bilirubin, UA: NEGATIVE
Blood, UA: NEGATIVE
Glucose, UA: NEGATIVE
Nitrite, UA: NEGATIVE

## 2012-08-29 LAB — LIPID PANEL: HDL: 49 mg/dL (ref >39)

## 2012-08-29 NOTE — Assessment & Plan Note (Signed)
Deteriorated and uncontrolled, no med change. More focus on change in diet , exercise and weight loss

## 2012-08-29 NOTE — Assessment & Plan Note (Signed)
Hyperlipidemia:Low fat diet discussed and encouraged.  Updated labs to be added on

## 2012-08-29 NOTE — Patient Instructions (Addendum)
F/u in 4 month  Work on low sodium diet with daily exercise and reduced carb intake, your blood pressure and blood sugar are slightly elevated   No evidence of infection in your urine .Call if symptoms persist or worsen  Non fasting HBa1C , chem 7, microalb  in 4 months   Pneumonia vaccine today   All the best for 2014  Additional tests lipid, hepatic and EGFR will be added to lab you just had, if abnormal,  You will be contacted

## 2012-08-29 NOTE — Assessment & Plan Note (Signed)
Deteriorated. Patient re-educated about  the importance of commitment to a  minimum of 150 minutes of exercise per week. The importance of healthy food choices with portion control discussed. Encouraged to start a food diary, count calories and to consider  joining a support group. Sample diet sheets offered. Goals set by the patient for the next several months.    

## 2012-08-29 NOTE — Assessment & Plan Note (Signed)
goal is 130/80 as a diabetic. Already on multiple meds, no dose increase, focus on liifestyle change, Dash diet and weight loss

## 2012-08-29 NOTE — Progress Notes (Signed)
  Subjective:    Patient ID: Kimberly Freeman, female    DOB: June 22, 1941, 71 y.o.   MRN: [id number]  HPI  The PT is here for follow up and re-evaluation of chronic medical conditions, medication management and review of any available recent lab and radiology data.  Preventive health is updated, specifically  Cancer screening and Immunization.   Questions or concerns regarding consultations or procedures which the PT has had in the interim are  addressed. The PT denies any adverse reactions to current medications since the last visit.  2 day h/o urinary frequency and urgency, no fever or chills  Exercise and diet were off for recent 3 weeks, blood sugars slightly elevated Up to date with eye and podiatry exams    Review of Systems See HPI Denies recent fever or chills. Denies sinus pressure, nasal congestion, ear pain or sore throat. Denies chest congestion, productive cough or wheezing. Denies chest pains, palpitations and leg swelling Denies abdominal pain, nausea, vomiting,diarrhea or constipation.    Denies joint pain, swelling and limitation in mobility. Denies headaches, seizures, numbness, or tingling. Denies depression, anxiety or insomnia. Denies skin break down or rash.        Objective:   Physical Exam Patient alert and oriented and in no cardiopulmonary distress.  HEENT: No facial asymmetry, EOMI, no sinus tenderness,  oropharynx pink and moist.  Neck supple no adenopathy.  Chest: Clear to auscultation bilaterally.  CVS: S1, S2 no murmurs, no S3.  ABD: Soft non tender. Bowel sounds normal.  Ext: No edema  MS: Adequate ROM spine, shoulders, hips and knees.  Skin: Intact, no ulcerations or rash noted.  Psych: Good eye contact, normal affect. Memory intact not anxious or depressed appearing.  CNS: CN 2-12 intact, power, tone and sensation normal throughout. Diabetic Foot Check:  Appearance - no lesions, ulcers or calluses Skin - no unusual pallor or  redness Sensation - grossly intact to light touch Pulses Left - Dorsalis Pedis and Posterior Tibia normal Right - Dorsalis Pedis and Posterior Tibia normal        Assessment & Plan:

## 2012-08-30 LAB — CREATININE WITH EST GFR
Creat: 0.89 mg/dL (ref 0.50–1.10)
GFR, Est African American: 74 mL/min
GFR, Est Non African American: 65 mL/min

## 2012-10-09 DIAGNOSIS — E119 Type 2 diabetes mellitus without complications: Secondary | ICD-10-CM | POA: Diagnosis not present

## 2012-10-09 DIAGNOSIS — E1149 Type 2 diabetes mellitus with other diabetic neurological complication: Secondary | ICD-10-CM | POA: Diagnosis not present

## 2012-10-29 ENCOUNTER — Other Ambulatory Visit: Payer: Self-pay

## 2012-10-29 MED ORDER — ATORVASTATIN CALCIUM 40 MG PO TABS: 40.0000 mg | ORAL_TABLET | Freq: Every day | ORAL | Status: DC

## 2012-12-24 ENCOUNTER — Ambulatory Visit: Payer: Medicare Other | Admitting: Family Medicine

## 2012-12-25 DIAGNOSIS — E119 Type 2 diabetes mellitus without complications: Secondary | ICD-10-CM | POA: Diagnosis not present

## 2012-12-25 DIAGNOSIS — E1149 Type 2 diabetes mellitus with other diabetic neurological complication: Secondary | ICD-10-CM | POA: Diagnosis not present

## 2013-01-06 ENCOUNTER — Other Ambulatory Visit: Payer: Self-pay | Admitting: Family Medicine

## 2013-01-06 DIAGNOSIS — E119 Type 2 diabetes mellitus without complications: Secondary | ICD-10-CM | POA: Diagnosis not present

## 2013-01-06 DIAGNOSIS — E785 Hyperlipidemia, unspecified: Secondary | ICD-10-CM | POA: Diagnosis not present

## 2013-01-07 ENCOUNTER — Encounter: Payer: Self-pay | Admitting: Family Medicine

## 2013-01-07 ENCOUNTER — Ambulatory Visit (INDEPENDENT_AMBULATORY_CARE_PROVIDER_SITE_OTHER): Payer: Medicare Other | Admitting: Family Medicine

## 2013-01-07 VITALS — BP 120/70 | HR 72 | Resp 18 | Ht 67.0 in | Wt 192.0 lb

## 2013-01-07 DIAGNOSIS — I10 Essential (primary) hypertension: Secondary | ICD-10-CM | POA: Diagnosis not present

## 2013-01-07 DIAGNOSIS — E669 Obesity, unspecified: Secondary | ICD-10-CM

## 2013-01-07 DIAGNOSIS — E785 Hyperlipidemia, unspecified: Secondary | ICD-10-CM

## 2013-01-07 DIAGNOSIS — Z79899 Other long term (current) drug therapy: Secondary | ICD-10-CM

## 2013-01-07 DIAGNOSIS — E119 Type 2 diabetes mellitus without complications: Secondary | ICD-10-CM | POA: Diagnosis not present

## 2013-01-07 LAB — BASIC METABOLIC PANEL
CO2: 27 mEq/L (ref 19–32)
Chloride: 104 mEq/L (ref 96–112)
Glucose, Bld: 173 mg/dL — ABNORMAL HIGH (ref 70–99)
Potassium: 4.4 mEq/L (ref 3.5–5.3)
Sodium: 141 mEq/L (ref 135–145)

## 2013-01-07 NOTE — Patient Instructions (Addendum)
Annual wellness  in end August.  It is important that you exercise regularly at least 30 minutes 5 times a week. If you develop chest pain, have severe difficulty breathing, or feel very tired, stop exercising immediately and seek medical attention   A healthy diet is rich in fruit, vegetables and whole grains. Poultry fish, nuts and beans are a healthy choice for protein rather then red meat. A low sodium diet and drinking 64 ounces of water daily is generally recommended. Oils and sweet should be limited. Carbohydrates especially for those who are diabetic or overweight, should be limited to 30-45 gram per meal. It is important to eat on a regular schedule, at least 3 times daily. Snacks should be primarily fruits, vegetables or nuts.   Fasting lipid, cmp and eGFR, HBA1C, tsh and cBC end August   Weight loss goal of 10 pounds.  Pls keep eye appt when due

## 2013-01-11 NOTE — Assessment & Plan Note (Signed)
Controlled, no change in medication Patient advised to reduce carb and sweets, commit to regular physical activity, take meds as prescribed, test blood as directed, and attempt to lose weight, to improve blood sugar control.  

## 2013-01-11 NOTE — Progress Notes (Signed)
  Subjective:    Patient ID: Kimberly Freeman, female    DOB: [birth year], 72 y.o.   MRN: [id number]  HPI The PT is here for follow up and re-evaluation of chronic medical conditions, medication management and review of any available recent lab and radiology data.  Preventive health is updated, specifically  Cancer screening and Immunization.   Questions or concerns regarding consultations or procedures which the PT has had in the interim are  addressed. The PT denies any adverse reactions to current medications since the last visit.  There are no new concerns.Has not been as diligent with diet and exercise , has gained weight , but blood sugars have remained good  There are no specific complaints       Review of Systems See HPI Denies recent fever or chills. Denies sinus pressure, nasal congestion, ear pain or sore throat. Denies chest congestion, productive cough or wheezing. Denies chest pains, palpitations and leg swelling Denies abdominal pain, nausea, vomiting,diarrhea or constipation.   Denies dysuria, frequency, hesitancy or incontinence. Denies joint pain, swelling and limitation in mobility. Denies headaches, seizures, numbness, or tingling. Denies depression, anxiety or insomnia. Denies skin break down or rash.        Objective:   Physical Exam Patient alert and oriented and in no cardiopulmonary distress.  HEENT: No facial asymmetry, EOMI, no sinus tenderness,  oropharynx pink and moist.  Neck supple no adenopathy.  Chest: Clear to auscultation bilaterally.  CVS: S1, S2 no murmurs, no S3.  ABD: Soft non tender. Bowel sounds normal.  Ext: No edema  MS: Adequate ROM spine, shoulders, hips and knees.  Skin: Intact, no ulcerations or rash noted.  Psych: Good eye contact, normal affect. Memory intact not anxious or depressed appearing.  CNS: CN 2-12 intact, power, tone and sensation normal throughout.        Assessment & Plan:

## 2013-01-11 NOTE — Assessment & Plan Note (Signed)
Hyperlipidemia:Low fat diet discussed and encouraged.  Controlled, no change in medication   

## 2013-01-11 NOTE — Assessment & Plan Note (Signed)
Deteriorated. Patient re-educated about  the importance of commitment to a  minimum of 150 minutes of exercise per week. The importance of healthy food choices with portion control discussed. Encouraged to start a food diary, count calories and to consider  joining a support group. Sample diet sheets offered. Goals set by the patient for the next several months.    

## 2013-01-11 NOTE — Assessment & Plan Note (Signed)
Controlled, no change in medication DASH diet and commitment to daily physical activity for a minimum of 30 minutes discussed and encouraged, as a part of hypertension management. The importance of attaining a healthy weight is also discussed.  

## 2013-01-12 ENCOUNTER — Other Ambulatory Visit: Payer: Self-pay

## 2013-01-12 MED ORDER — IRBESARTAN 300 MG PO TABS: 300.0000 mg | ORAL_TABLET | Freq: Every day | ORAL | Status: DC

## 2013-01-29 ENCOUNTER — Telehealth: Payer: Self-pay | Admitting: Family Medicine

## 2013-01-29 ENCOUNTER — Other Ambulatory Visit: Payer: Self-pay

## 2013-01-29 MED ORDER — GLIPIZIDE ER 10 MG PO TB24: 10.0000 mg | ORAL_TABLET | ORAL | Status: DC

## 2013-01-29 MED ORDER — IRBESARTAN 300 MG PO TABS: 300.0000 mg | ORAL_TABLET | Freq: Every day | ORAL | Status: DC

## 2013-01-29 MED ORDER — ATENOLOL 50 MG PO TABS: 50.0000 mg | ORAL_TABLET | Freq: Two times a day (BID) | ORAL | Status: DC

## 2013-01-29 NOTE — Telephone Encounter (Signed)
Patient states that the pharmacy has received her 2 rx back from here which are the avapro and atenolol and ck on her glipizide

## 2013-01-29 NOTE — Telephone Encounter (Signed)
All 3 meds resent to [facility] order pharmacy.

## 2013-04-15 ENCOUNTER — Other Ambulatory Visit: Payer: Self-pay

## 2013-04-15 MED ORDER — GLIPIZIDE ER 10 MG PO TB24: 10.0000 mg | ORAL_TABLET | ORAL | Status: DC

## 2013-04-16 DIAGNOSIS — E1149 Type 2 diabetes mellitus with other diabetic neurological complication: Secondary | ICD-10-CM | POA: Diagnosis not present

## 2013-04-16 DIAGNOSIS — E119 Type 2 diabetes mellitus without complications: Secondary | ICD-10-CM | POA: Diagnosis not present

## 2013-05-01 ENCOUNTER — Other Ambulatory Visit: Payer: Self-pay

## 2013-06-03 DIAGNOSIS — E669 Obesity, unspecified: Secondary | ICD-10-CM | POA: Diagnosis not present

## 2013-06-03 DIAGNOSIS — E119 Type 2 diabetes mellitus without complications: Secondary | ICD-10-CM | POA: Diagnosis not present

## 2013-06-03 DIAGNOSIS — I10 Essential (primary) hypertension: Secondary | ICD-10-CM | POA: Diagnosis not present

## 2013-06-03 DIAGNOSIS — Z79899 Other long term (current) drug therapy: Secondary | ICD-10-CM | POA: Diagnosis not present

## 2013-06-03 DIAGNOSIS — E785 Hyperlipidemia, unspecified: Secondary | ICD-10-CM | POA: Diagnosis not present

## 2013-06-03 LAB — TSH: TSH: 3.427 u[IU]/mL (ref 0.350–4.500)

## 2013-06-03 LAB — COMPLETE METABOLIC PANEL WITH GFR
Albumin: 4.3 g/dL (ref 3.5–5.2)
Alkaline Phosphatase: 72 U/L (ref 39–117)
BUN: 16 mg/dL (ref 6–23)
CO2: 28 mEq/L (ref 19–32)
Calcium: 9.8 mg/dL (ref 8.4–10.5)
Chloride: 105 mEq/L (ref 96–112)
GFR, Est African American: 73 mL/min
GFR, Est Non African American: 64 mL/min
Glucose, Bld: 156 mg/dL — ABNORMAL HIGH (ref 70–99)
Potassium: 4.6 mEq/L (ref 3.5–5.3)
Sodium: 140 mEq/L (ref 135–145)
Total Protein: 6.7 g/dL (ref 6.0–8.3)

## 2013-06-03 LAB — LIPID PANEL
Cholesterol: 168 mg/dL (ref 0–200)
HDL: 48 mg/dL (ref >39)
Triglycerides: 135 mg/dL (ref <150)

## 2013-06-03 LAB — CBC
Hemoglobin: 12.6 g/dL (ref 12.0–15.0)
MCH: 29.7 pg (ref 26.0–34.0)
MCHC: 33.9 g/dL (ref 30.0–36.0)
RDW: 13.7 % (ref 11.5–15.5)

## 2013-06-04 ENCOUNTER — Ambulatory Visit (HOSPITAL_COMMUNITY)
Admission: RE | Admit: 2013-06-04 | Discharge: 2013-06-04 | Disposition: A | Discharge status: Home / Self Care | Payer: Medicare Other | Source: Ambulatory Visit | Attending: Family Medicine | Admitting: Family Medicine

## 2013-06-04 ENCOUNTER — Ambulatory Visit (INDEPENDENT_AMBULATORY_CARE_PROVIDER_SITE_OTHER): Payer: Medicare Other | Admitting: Family Medicine

## 2013-06-04 ENCOUNTER — Encounter: Payer: Self-pay | Admitting: Family Medicine

## 2013-06-04 VITALS — BP 124/72 | HR 73 | Resp 16 | Ht 67.0 in | Wt 189.8 lb

## 2013-06-04 DIAGNOSIS — Z0000 Encounter for general adult medical examination without abnormal findings: Secondary | ICD-10-CM | POA: Diagnosis not present

## 2013-06-04 DIAGNOSIS — E109 Type 1 diabetes mellitus without complications: Secondary | ICD-10-CM

## 2013-06-04 DIAGNOSIS — M19019 Primary osteoarthritis, unspecified shoulder: Secondary | ICD-10-CM | POA: Diagnosis not present

## 2013-06-04 DIAGNOSIS — M898X9 Other specified disorders of bone, unspecified site: Secondary | ICD-10-CM | POA: Insufficient documentation

## 2013-06-04 DIAGNOSIS — M25519 Pain in unspecified shoulder: Secondary | ICD-10-CM | POA: Diagnosis not present

## 2013-06-04 DIAGNOSIS — M25512 Pain in left shoulder: Secondary | ICD-10-CM

## 2013-06-04 DIAGNOSIS — G8929 Other chronic pain: Secondary | ICD-10-CM

## 2013-06-04 DIAGNOSIS — M25511 Pain in right shoulder: Secondary | ICD-10-CM

## 2013-06-04 MED ORDER — METFORMIN HCL ER 500 MG PO TB24: 1000.0000 mg | ORAL_TABLET | Freq: Two times a day (BID) | ORAL | Status: DC

## 2013-06-04 MED ORDER — IRBESARTAN 300 MG PO TABS: 300.0000 mg | ORAL_TABLET | Freq: Every day | ORAL | Status: DC

## 2013-06-04 MED ORDER — ATENOLOL 50 MG PO TABS: 50.0000 mg | ORAL_TABLET | Freq: Two times a day (BID) | ORAL | Status: DC

## 2013-06-04 MED ORDER — GLIPIZIDE ER 5 MG PO TB24: 5.0000 mg | ORAL_TABLET | Freq: Every day | ORAL | Status: DC

## 2013-06-04 NOTE — Progress Notes (Signed)
Subjective:    Patient ID: Kimberly Freeman, female    DOB: [birth year], 72 y.o.   MRN: [id number]  HPI Preventive Screening-Counseling & Management   Patient present here today for a Medicare annual wellness visit. C/o right shoulder pain and reduced mobility, no interest in PT at this time.   Current Problems (verified)   Medications Prior to Visit Allergies (verified)   PAST HISTORY  Family History: 1 sister living, 1 died at 47 of lung cancer. Both parents deceased Mom died at 10 of leukemia, Dad died in his 48's unknown cause  Social History  Married x 31 years, no children. Never nicotine, 2nd hand smoke exposure , reduced alcohol use, none currently, no street drug use   Risk Factors  Current exercise habits:  5 days per week , 30 minutes or more  Dietary issues discussed:low carbs, low sugar , a lot of vegetable and fruit   Cardiac risk factors: diabetes  Depression Screen  (Note: if answer to either of the following is "Yes", a more complete depression screening is indicated)   Over the past two weeks, have you felt down, depressed or hopeless? No  Over the past two weeks, have you felt little interest or pleasure in doing things? No  Have you lost interest or pleasure in daily life? No  Do you often feel hopeless? No  Do you cry easily over simple problems? No   Activities of Daily Living  In your present state of health, do you have any difficulty performing the following activities?  Driving?: No Managing money?: No Feeding yourself?:No Getting from bed to chair?:No Climbing a flight of stairs?:No Preparing food and eating?:No Bathing or showering?:No Getting dressed?:No Getting to the toilet?:No Using the toilet?:No Moving around from place to place?: No  Fall Risk Assessment In the past year have you fallen or had a near fall?:No Are you currently taking any medications that make you dizzy?:No   Hearing Difficulties: No Do you often ask people  to speak up or repeat themselves?:No Do you experience ringing or noises in your ears?:No Do you have difficulty understanding soft or whispered voices?:No  Cognitive Testing  Alert? Yes Normal Appearance?Yes  Oriented to person? Yes Place? Yes  Time? Yes  Displays appropriate judgment?Yes  Can read the correct time from a watch face? yes Are you having problems remembering things?No  Advanced Directives have been discussed with the patient?Yes , full code   List the Names of Other Physician/Practitioners you currently use: Dr Delcie Roch any recent Medical Services you may have received from other than [facility] providers in the past year (date may be approximate).   Assessment:    Annual Wellness Exam   Plan:    During the course of the visit the patient was educated and counseled about appropriate screening and preventive services including:  A healthy diet is rich in fruit, vegetables and whole grains. Poultry fish, nuts and beans are a healthy choice for protein rather then red meat. A low sodium diet and drinking 64 ounces of water daily is generally recommended. Oils and sweet should be limited. Carbohydrates especially for those who are diabetic or overweight, should be limited to 30-45 gram per meal. It is important to eat on a regular schedule, at least 3 times daily. Snacks should be primarily fruits, vegetables or nuts. It is important that you exercise regularly at least 30 minutes 5 times a week. If you develop chest pain, have severe difficulty breathing, or  feel very tired, stop exercising immediately and seek medical attention  Immunization reviewed and updated. Cancer screening reviewed and updated    Patient Instructions (the written plan) was given to the patient.  Medicare Attestation  I have personally reviewed:  The patient's medical and social history  Their use of alcohol, tobacco or illicit drugs  Their current medications and supplements  The  patient's functional ability including ADLs,fall risks, home safety risks, cognitive, and hearing and visual impairment  Diet and physical activities  Evidence for depression or mood disorders  The patient's weight, height, BMI, and visual acuity have been recorded in the chart. I have made referrals, counseling, and provided education to the patient based on review of the above and I have provided the patient with a written personalized care plan for preventive services.      Review of Systems     Objective:   Physical Exam        Assessment & Plan:

## 2013-06-04 NOTE — Patient Instructions (Addendum)
F/u in mid December, call if you need me before  Reduce glipizide to 5mg  daily when next received  Congrats on weight loss,and improved blood sugar, doesn't need to be this low so medication is reduced  Xray of left shoulder today, keep the joint moving, and reconsider the value of PT, short term  Pls schedule eye exam past due  Mammogram due in September   HBA1C i and chem 7 in [place]

## 2013-06-07 DIAGNOSIS — Z0000 Encounter for general adult medical examination without abnormal findings: Secondary | ICD-10-CM | POA: Insufficient documentation

## 2013-06-07 NOTE — Assessment & Plan Note (Signed)
Annual wellness as documented. Pt is extremely functional with no limitations in physical or mental capacities. She has had no falls and is not depressed, very involved in er community End of life issues have been discussed with her spouse, no documentation however Exercise and dietary habits are  good

## 2013-06-07 NOTE — Assessment & Plan Note (Signed)
Exam and history are  c/w osteoarthritis, will obtain xray. PT is rejected by pt at this time

## 2013-06-07 NOTE — Assessment & Plan Note (Signed)
May be ove corrected based on age and fact that pt states she sometimes has to eat so sugar does not fall too  Low, at times fasting sugar is in the 70's . Reduce glipizide to 5 mg from 10mg 

## 2013-07-09 DIAGNOSIS — L609 Nail disorder, unspecified: Secondary | ICD-10-CM | POA: Diagnosis not present

## 2013-07-09 DIAGNOSIS — E119 Type 2 diabetes mellitus without complications: Secondary | ICD-10-CM | POA: Diagnosis not present

## 2013-07-09 DIAGNOSIS — E1149 Type 2 diabetes mellitus with other diabetic neurological complication: Secondary | ICD-10-CM | POA: Diagnosis not present

## 2013-07-09 DIAGNOSIS — L851 Acquired keratosis [keratoderma] palmaris et plantaris: Secondary | ICD-10-CM | POA: Diagnosis not present

## 2013-07-14 ENCOUNTER — Other Ambulatory Visit: Payer: Self-pay | Admitting: Family Medicine

## 2013-07-14 DIAGNOSIS — Z139 Encounter for screening, unspecified: Secondary | ICD-10-CM

## 2013-07-17 ENCOUNTER — Telehealth: Payer: Self-pay

## 2013-07-17 ENCOUNTER — Ambulatory Visit (INDEPENDENT_AMBULATORY_CARE_PROVIDER_SITE_OTHER): Payer: Medicare Other

## 2013-07-17 VITALS — BP 160/82 | Wt 193.0 lb

## 2013-07-17 DIAGNOSIS — M25519 Pain in unspecified shoulder: Secondary | ICD-10-CM | POA: Diagnosis not present

## 2013-07-17 DIAGNOSIS — G8929 Other chronic pain: Secondary | ICD-10-CM

## 2013-07-17 MED ORDER — METHYLPREDNISOLONE ACETATE 80 MG/ML IJ SUSP
80.0000 mg | Freq: Once | INTRAMUSCULAR | Status: AC
Start: 2013-07-17 — End: 2013-07-17
  Administered 2013-07-17: 80 mg via INTRAMUSCULAR

## 2013-07-17 MED ORDER — KETOROLAC TROMETHAMINE 60 MG/2ML IJ SOLN
60.0000 mg | Freq: Once | INTRAMUSCULAR | Status: AC
  Administered 2013-07-17: 60 mg via INTRAMUSCULAR

## 2013-07-17 NOTE — Progress Notes (Signed)
Patient in for injections for the control of shoulder pain.   Toradol 60mg  given IM and Depo-medrol 80mg  given IM.  No sign or symptom of adverse reaction.  No voiced complaints.

## 2013-07-17 NOTE — Telephone Encounter (Signed)
Noted and patient in for nurse visit.

## 2013-07-17 NOTE — Telephone Encounter (Signed)
pls administer toradol 60mg  IM and depo medrol 80 mg Im for shoulder pain, nurse visit only

## 2013-08-03 ENCOUNTER — Ambulatory Visit (HOSPITAL_COMMUNITY)
Admission: RE | Admit: 2013-08-03 | Discharge: 2013-08-03 | Disposition: A | Discharge status: Home / Self Care | Payer: Medicare Other | Source: Ambulatory Visit | Attending: Family Medicine | Admitting: Family Medicine

## 2013-08-03 DIAGNOSIS — Z139 Encounter for screening, unspecified: Secondary | ICD-10-CM

## 2013-08-03 DIAGNOSIS — Z1231 Encounter for screening mammogram for malignant neoplasm of breast: Secondary | ICD-10-CM | POA: Insufficient documentation

## 2013-08-07 ENCOUNTER — Encounter (INDEPENDENT_AMBULATORY_CARE_PROVIDER_SITE_OTHER): Payer: Self-pay

## 2013-08-07 ENCOUNTER — Ambulatory Visit (INDEPENDENT_AMBULATORY_CARE_PROVIDER_SITE_OTHER): Payer: Medicare Other

## 2013-08-07 DIAGNOSIS — Z23 Encounter for immunization: Secondary | ICD-10-CM | POA: Diagnosis not present

## 2013-09-01 ENCOUNTER — Telehealth: Payer: Self-pay

## 2013-09-01 NOTE — Telephone Encounter (Signed)
Patient aware.

## 2013-09-01 NOTE — Telephone Encounter (Signed)
Advise oTC decongestant with tylenol for headachje and netty pot flush. Give appt for Thursday as a work in , she needs to cancel if no fever, chills, or symptoms improved please

## 2013-09-21 DIAGNOSIS — E109 Type 1 diabetes mellitus without complications: Secondary | ICD-10-CM | POA: Diagnosis not present

## 2013-09-21 LAB — BASIC METABOLIC PANEL
CO2: 27 mEq/L (ref 19–32)
Calcium: 9.8 mg/dL (ref 8.4–10.5)
Creat: 0.84 mg/dL (ref 0.50–1.10)
Glucose, Bld: 153 mg/dL — ABNORMAL HIGH (ref 70–99)

## 2013-09-22 ENCOUNTER — Encounter: Payer: Self-pay | Admitting: Family Medicine

## 2013-09-22 ENCOUNTER — Ambulatory Visit (INDEPENDENT_AMBULATORY_CARE_PROVIDER_SITE_OTHER): Payer: Medicare Other | Admitting: Family Medicine

## 2013-09-22 ENCOUNTER — Encounter (INDEPENDENT_AMBULATORY_CARE_PROVIDER_SITE_OTHER): Payer: Self-pay

## 2013-09-22 VITALS — BP 158/82 | HR 80 | Resp 18 | Ht 67.0 in | Wt 190.1 lb

## 2013-09-22 DIAGNOSIS — I10 Essential (primary) hypertension: Secondary | ICD-10-CM | POA: Diagnosis not present

## 2013-09-22 DIAGNOSIS — Z13820 Encounter for screening for osteoporosis: Secondary | ICD-10-CM

## 2013-09-22 DIAGNOSIS — E669 Obesity, unspecified: Secondary | ICD-10-CM

## 2013-09-22 DIAGNOSIS — J309 Allergic rhinitis, unspecified: Secondary | ICD-10-CM

## 2013-09-22 DIAGNOSIS — E785 Hyperlipidemia, unspecified: Secondary | ICD-10-CM

## 2013-09-22 DIAGNOSIS — E109 Type 1 diabetes mellitus without complications: Secondary | ICD-10-CM | POA: Diagnosis not present

## 2013-09-22 DIAGNOSIS — J302 Other seasonal allergic rhinitis: Secondary | ICD-10-CM

## 2013-09-22 DIAGNOSIS — F439 Reaction to severe stress, unspecified: Secondary | ICD-10-CM

## 2013-09-22 DIAGNOSIS — Z639 Problem related to primary support group, unspecified: Secondary | ICD-10-CM

## 2013-09-22 NOTE — Progress Notes (Signed)
   Subjective:    Patient ID: Kimberly Freeman, female    DOB: [birth year], 72 y.o.   MRN: [id number]  HPI The PT is here for follow up and re-evaluation of chronic medical conditions, medication management and review of any available recent lab and radiology data.  Preventive health is updated, specifically  Cancer screening and Immunization.   Unhappy with recent handling of tele call re URI symptoms which are almost entirely cleared with no antibiotic use.Reviewdd the situation, she had been advised to make appt in 2 days if no better, but she herself states sh was 'too upset" at the time to follow through. She has improved, no current fever or chills, drainage is less and clear, no productive cough. The PT denies any adverse reactions to current medications since the last visit.  States has been unable to focus as much as she would like to on her health, overwhelmed and concerned about her spouse's health, reports fasting sugars elevated at around 150, denies polyuria, polydipsia or hypoglycemic episodes       Review of Systems See HPI . Denies chest congestion, productive cough or wheezing. Denies chest pains, palpitations and leg swelling Denies abdominal pain, nausea, vomiting,diarrhea or constipation.   Denies dysuria, frequency, hesitancy or incontinence. Denies joint pain, swelling and limitation in mobility. Denies headaches, seizures, numbness, or tingling. Increased anxiety and poor sleep, depression screen is negative Denies skin break down or rash.        Objective:   Physical Exam Patient alert and oriented and in no cardiopulmonary distress.  HEENT: No facial asymmetry, EOMI, no sinus tenderness,  oropharynx pink and moist.  Neck supple no adenopathy.Eryhtema and edema of nasal mucosa  Chest: Clear to auscultation bilaterally.  CVS: S1, S2 no murmurs, no S3.  ABD: Soft non tender. Bowel sounds normal.  Ext: No edema  MS: Adequate ROM spine, shoulders, hips  and knees.  Skin: Intact, no ulcerations or rash noted.  Psych: Good eye contact, tearful at times. Memory intact  anxious not depressed appearing.  CNS: CN 2-12 intact, power,  normal throughout.        Assessment & Plan:

## 2013-09-22 NOTE — Patient Instructions (Addendum)
F/u in 2 month, call if you need me before  If you start having worsened sinus symptoms, yellow green drainage, fever or chills, please contact us , as of today the worse is over you do not need antibiotics.  No changes in medication  You will be referred for a bone density test  I hope that your recent misunderstanding is cleared up, we are here for you  Blood pressure a little high today so please work on regular exercise, low sodium diet and weight loss

## 2013-09-28 ENCOUNTER — Ambulatory Visit (HOSPITAL_COMMUNITY)
Admission: RE | Admit: 2013-09-28 | Discharge: 2013-09-28 | Disposition: A | Discharge status: Home / Self Care | Payer: Medicare Other | Source: Ambulatory Visit | Attending: Family Medicine | Admitting: Family Medicine

## 2013-09-28 DIAGNOSIS — Z13820 Encounter for screening for osteoporosis: Secondary | ICD-10-CM

## 2013-09-28 DIAGNOSIS — R29890 Loss of height: Secondary | ICD-10-CM | POA: Insufficient documentation

## 2013-09-28 DIAGNOSIS — Z780 Asymptomatic menopausal state: Secondary | ICD-10-CM | POA: Insufficient documentation

## 2013-10-01 ENCOUNTER — Ambulatory Visit: Payer: Medicare Other | Admitting: Family Medicine

## 2013-10-25 DIAGNOSIS — F439 Reaction to severe stress, unspecified: Secondary | ICD-10-CM | POA: Insufficient documentation

## 2013-10-25 DIAGNOSIS — J302 Other seasonal allergic rhinitis: Secondary | ICD-10-CM | POA: Insufficient documentation

## 2013-10-25 NOTE — Assessment & Plan Note (Addendum)
Uncontrolled , pt tearful and stressed, upset with office re recent handling of phone call, and also very worried about the health of her spouse No med change at this visit, but will need inc in med dose at next visit if persists, of note BP was elevated at  September visit also. DASH diet and commitment to daily physical activity for a minimum of 30 minutes discussed and encouraged, as a part of hypertension management. The importance of attaining a healthy weight is also discussed.

## 2013-10-25 NOTE — Assessment & Plan Note (Signed)
Controlled, no change in medication Hyperlipidemia:Low fat diet discussed and encouraged.  Updated lab in 3 to 4 months

## 2013-10-25 NOTE — Assessment & Plan Note (Addendum)
Recent acute onset of URI symptoms, clearing without antibiotc use, pt educated re safest and best approach to these symptoms, she is to call in 2 to 3 days if her symptoms worsen

## 2013-10-25 NOTE — Assessment & Plan Note (Signed)
Improved. Pt applauded on succesful weight loss through lifestyle change, and encouraged to continue same. Weight loss goal set for the next several months.  

## 2013-10-25 NOTE — Assessment & Plan Note (Signed)
Reports marked deterioration in spouse's health with paranoid behavior in the past approx 1 year , which has her very stressed. Depression screen is negative. Appt scheduled for her to come into office with her husband to address this concern. No med indicated  For pt

## 2013-10-25 NOTE — Assessment & Plan Note (Signed)
Deteriorated, pt under a lot of stress due to spouse's condition, less able to focus on healthy lifestyle in recen t times, no changes in medication . Patient advised to reduce carb and sweets, commit to regular physical activity, take meds as prescribed, test blood as directed, and attempt to lose weight, to improve blood sugar control.

## 2013-11-02 IMAGING — MG MM DIGITAL SCREENING
4 series · 4 of 4 positions shown · non-contrast
Comparison: Previous exam(s).

CLINICAL DATA: Screening.

EXAM:
DIGITAL SCREENING BILATERAL MAMMOGRAM WITH CAD

[L CC]
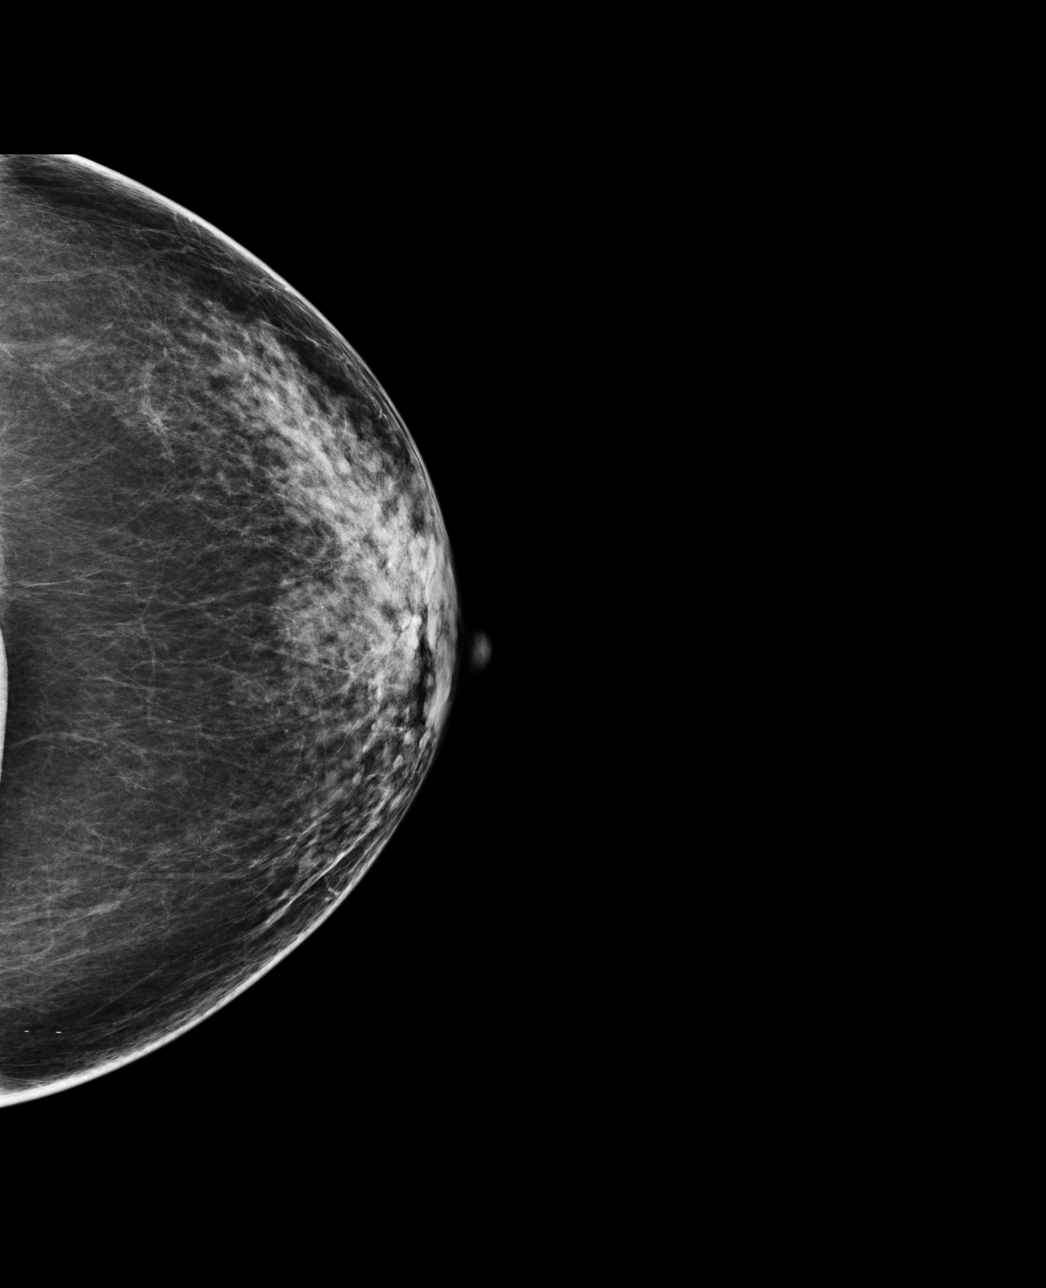

[L MLO]
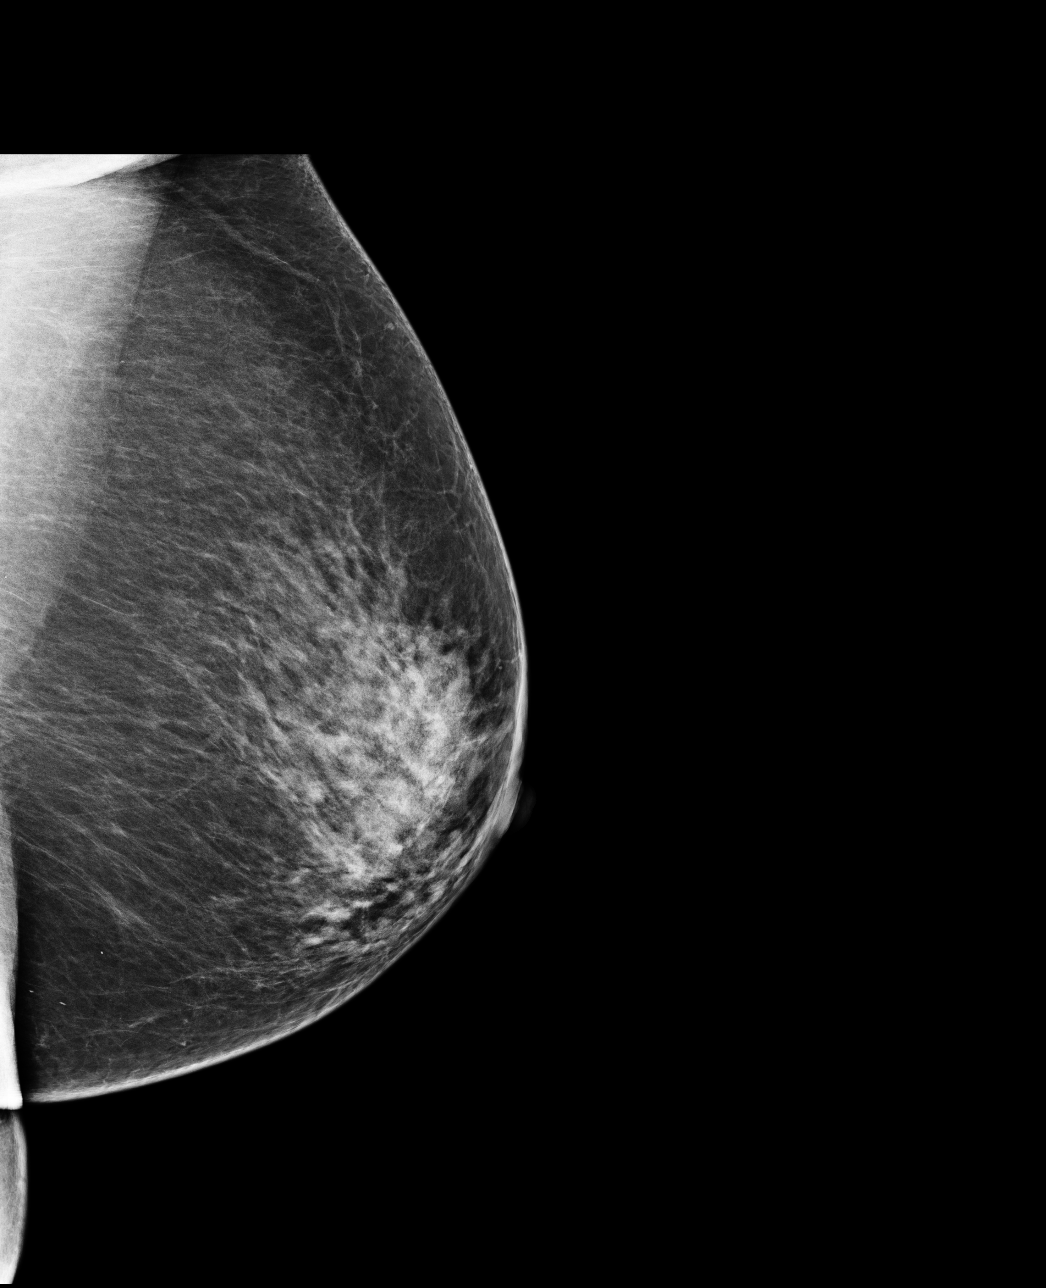

[R CC]
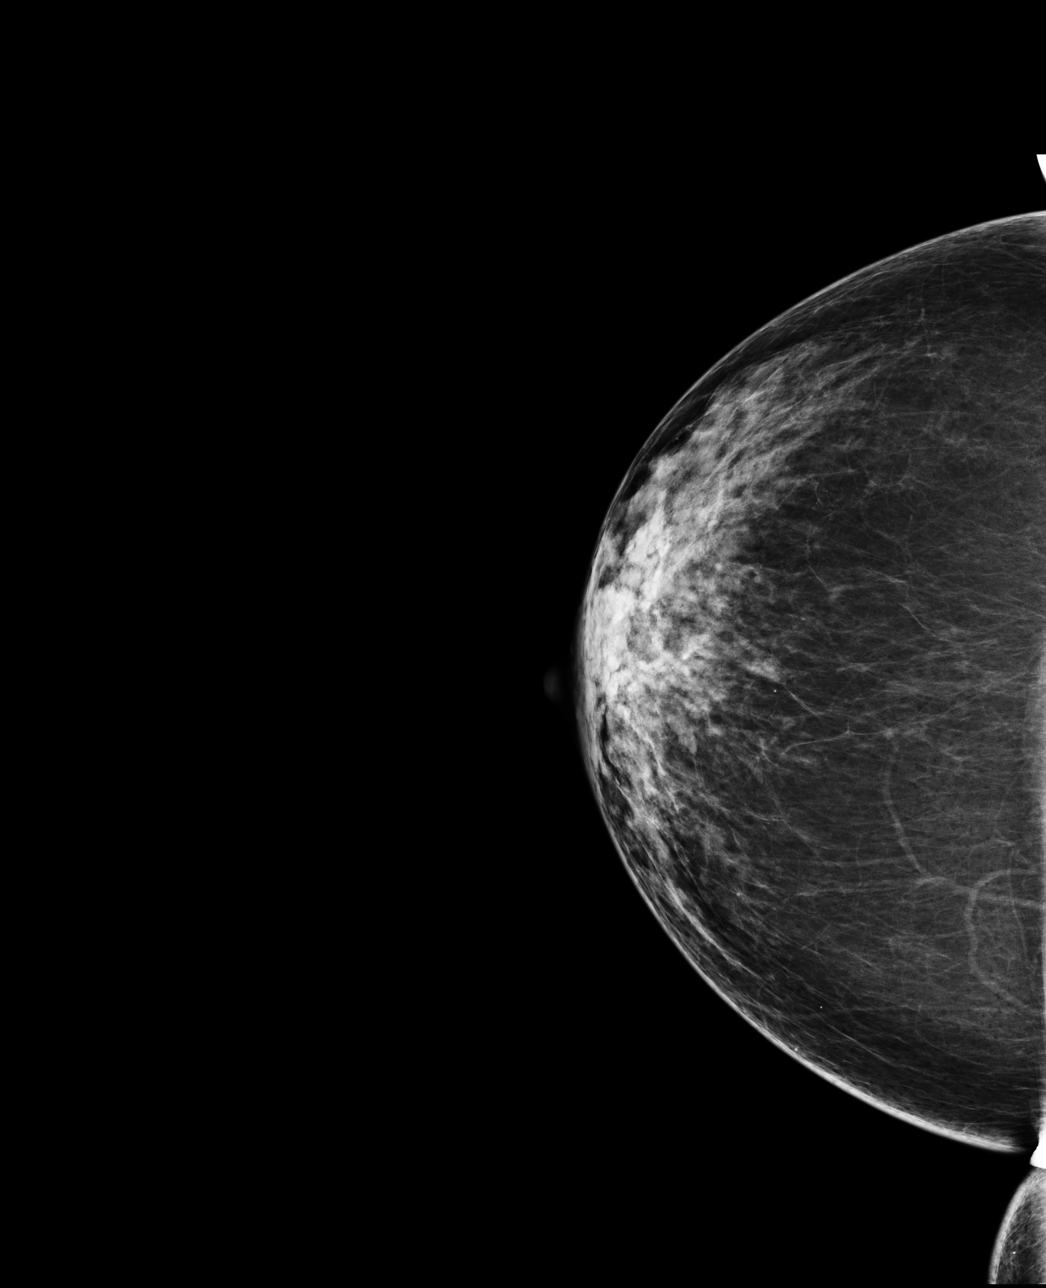

[R MLO]
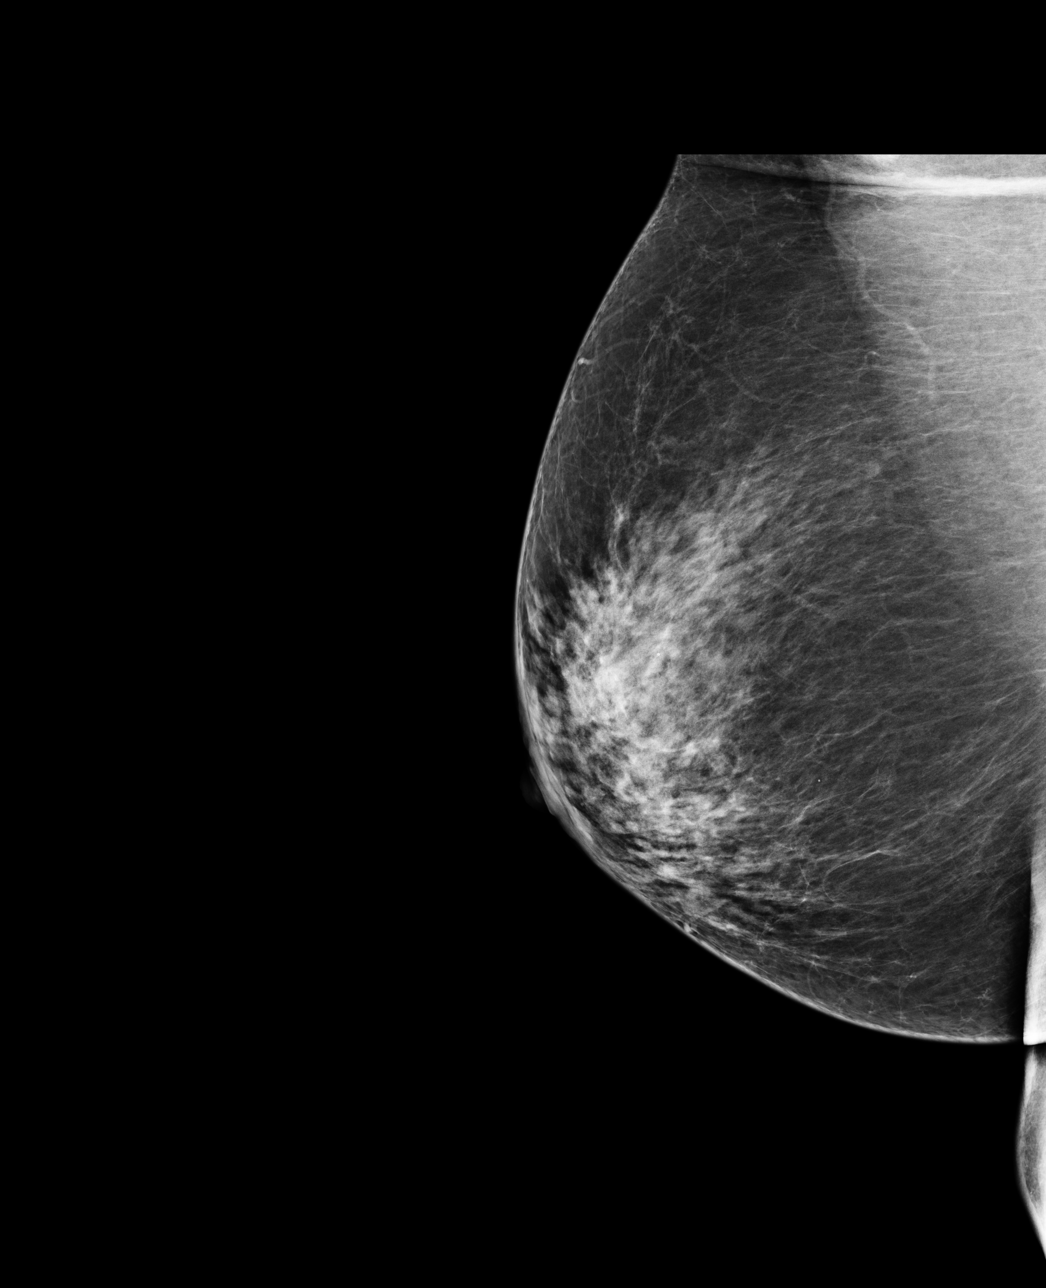

[4 of 4 positions shown; findings below may reference images not displayed]

ACR Breast Density Category b: There are scattered areas of
fibroglandular density.
FINDINGS: There are no findings suspicious for malignancy. Images were
processed with CAD.
IMPRESSION: No mammographic evidence of malignancy. A result letter of this
screening mammogram will be mailed directly to the patient.

RECOMMENDATION:
Screening mammogram in one year. (Code:[id number])

BI-RADS CATEGORY  1: Negative

## 2013-11-18 ENCOUNTER — Telehealth: Payer: Self-pay

## 2013-11-18 NOTE — Telephone Encounter (Signed)
Called and left message on voicemail notifying patient that there are no labs ordered at this time if she will come to appointment fasting if able.

## 2013-11-23 ENCOUNTER — Encounter (INDEPENDENT_AMBULATORY_CARE_PROVIDER_SITE_OTHER): Payer: Self-pay

## 2013-11-23 ENCOUNTER — Ambulatory Visit (INDEPENDENT_AMBULATORY_CARE_PROVIDER_SITE_OTHER): Payer: Medicare Other | Admitting: Family Medicine

## 2013-11-23 ENCOUNTER — Encounter: Payer: Self-pay | Admitting: Family Medicine

## 2013-11-23 VITALS — BP 160/82 | HR 72 | Resp 18 | Ht 67.0 in | Wt 188.1 lb

## 2013-11-23 DIAGNOSIS — Z639 Problem related to primary support group, unspecified: Secondary | ICD-10-CM

## 2013-11-23 DIAGNOSIS — E785 Hyperlipidemia, unspecified: Secondary | ICD-10-CM

## 2013-11-23 DIAGNOSIS — E119 Type 2 diabetes mellitus without complications: Secondary | ICD-10-CM

## 2013-11-23 DIAGNOSIS — I10 Essential (primary) hypertension: Secondary | ICD-10-CM

## 2013-11-23 DIAGNOSIS — IMO0001 Reserved for inherently not codable concepts without codable children: Secondary | ICD-10-CM

## 2013-11-23 DIAGNOSIS — E109 Type 1 diabetes mellitus without complications: Secondary | ICD-10-CM | POA: Diagnosis not present

## 2013-11-23 DIAGNOSIS — F439 Reaction to severe stress, unspecified: Secondary | ICD-10-CM

## 2013-11-23 DIAGNOSIS — Z794 Long term (current) use of insulin: Secondary | ICD-10-CM

## 2013-11-23 MED ORDER — SPIRONOLACTONE 12.5 MG HALF TABLET: 12.5000 mg | ORAL_TABLET | Freq: Every day | ORAL | Status: DC

## 2013-11-23 NOTE — Patient Instructions (Addendum)
F/u end April call if you need me before  Fasting lipid, cmp and EGFr, hBa1C and microalb  End April before visit  Non fasting chem 7 in 4 weeks to check potassium with new blood pressure med  Blood -pressure is still too high, new additional tablet spironolactone , HALF every morning, may cause you to foe to bathroom to urinate more initially, continue all other blood pressure medications please  It is important that you exercise regularly at least 30 minutes 5 times a week. If you develop chest pain, have severe difficulty breathing, or feel very tired, stop exercising immediately and seek medical attention

## 2013-11-25 ENCOUNTER — Other Ambulatory Visit: Payer: Self-pay

## 2013-11-25 MED ORDER — SPIRONOLACTONE 25 MG PO TABS: 12.5000 mg | ORAL_TABLET | Freq: Once | ORAL | Status: DC

## 2013-11-26 DIAGNOSIS — E1149 Type 2 diabetes mellitus with other diabetic neurological complication: Secondary | ICD-10-CM | POA: Diagnosis not present

## 2013-11-26 DIAGNOSIS — E119 Type 2 diabetes mellitus without complications: Secondary | ICD-10-CM | POA: Diagnosis not present

## 2013-12-20 NOTE — Assessment & Plan Note (Signed)
Uncontrolled increase med dose DASH diet and commitment to daily physical activity for a minimum of 30 minutes discussed and encouraged, as a part of hypertension management. The importance of attaining a healthy weight is also discussed.

## 2013-12-20 NOTE — Assessment & Plan Note (Signed)
Updated lab needed at/ before next visit. Hyperlipidemia:Low fat diet discussed and encouraged.   

## 2013-12-20 NOTE — Progress Notes (Signed)
   Subjective:    Patient ID: Kimberly Freeman, female    DOB: [birth year], 73 y.o.   MRN: [id number]  HPI Pt in primarily for review of uncontrolled blood pressure as well as her general condition. Her last visit was very emotional when she broke down cryting and expressing fear re tthe health of her spouse who has been exhibiting paranoid and increasingly odd behavior for the past year. The relationship has improved between them since she accompanied him to a visit to openly address this   Review of Systems See HPI Denies recent fever or chills. Denies sinus pressure, nasal congestion, ear pain or sore throat. Denies chest congestion, productive cough or wheezing. Denies chest pains, palpitations and leg swelling Denies abdominal pain, nausea, vomiting,diarrhea or constipation.   Denies dysuria, frequency, hesitancy or incontinence. Denies joint pain, swelling and limitation in mobility. Denies headaches, seizures, numbness, or tingling.  Denies skin break down or rash.        Objective:   Physical Exam BP 160/82  Pulse 72  Resp 18  Ht 5\' 7"  (1.702 m)  Wt 188 lb 1.9 oz (85.331 kg)  BMI 29.46 kg/m2  SpO2 98% Patient alert and oriented and in no cardiopulmonary distress.  HEENT: No facial asymmetry, EOMI, no sinus tenderness,  oropharynx pink and moist.  Neck supple no adenopathy.  Chest: Clear to auscultation bilaterally.  CVS: S1, S2 no murmurs, no S3.  ABD: Soft non tender. Bowel sounds normal.  Ext: No edema  MS: Adequate ROM spine, shoulders, hips and knees.  Skin: Intact, no ulcerations or rash noted.  Psych: Good eye contact, normal affect. Memory intact not anxious or depressed appearing.  CNS: CN 2-12 intact, power, tone and sensation normal throughout.        Assessment & Plan:  HYPERTENSION Uncontrolled increase med dose DASH diet and commitment to daily physical activity for a minimum of 30 minutes discussed and encouraged, as a part of  hypertension management. The importance of attaining a healthy weight is also discussed.   Diabetes mellitus, insulin dependent (IDDM), controlled Controlled, no change in medication Patient advised to reduce carb and sweets, commit to regular physical activity, take meds as prescribed, test blood as directed, and attempt to lose weight, to improve blood sugar control.   Stress at home Improved as spouse is more receptive to the fact that she cares and he needs help  HYPERLIPIDEMIA Updated lab needed at/ before next visit. Hyperlipidemia:Low fat diet discussed and encouraged.

## 2013-12-20 NOTE — Assessment & Plan Note (Signed)
Improved as spouse is more receptive to the fact that she cares and he needs help

## 2013-12-20 NOTE — Assessment & Plan Note (Signed)
Controlled, no change in medication Patient advised to reduce carb and sweets, commit to regular physical activity, take meds as prescribed, test blood as directed, and attempt to lose weight, to improve blood sugar control.  

## 2013-12-22 DIAGNOSIS — I10 Essential (primary) hypertension: Secondary | ICD-10-CM | POA: Diagnosis not present

## 2013-12-22 LAB — BASIC METABOLIC PANEL
BUN: 12 mg/dL (ref 6–23)
CHLORIDE: 106 meq/L (ref 96–112)
CO2: 26 mEq/L (ref 19–32)
Calcium: 9.6 mg/dL (ref 8.4–10.5)
Creat: 0.84 mg/dL (ref 0.50–1.10)
Glucose, Bld: 162 mg/dL — ABNORMAL HIGH (ref 70–99)
POTASSIUM: 4.2 meq/L (ref 3.5–5.3)
Sodium: 140 mEq/L (ref 135–145)

## 2014-01-12 ENCOUNTER — Other Ambulatory Visit: Payer: Self-pay

## 2014-01-12 MED ORDER — ATORVASTATIN CALCIUM 40 MG PO TABS: 40.0000 mg | ORAL_TABLET | Freq: Every day | ORAL | Status: DC

## 2014-01-12 MED ORDER — ATENOLOL 50 MG PO TABS: 50.0000 mg | ORAL_TABLET | Freq: Two times a day (BID) | ORAL | Status: DC

## 2014-01-13 ENCOUNTER — Other Ambulatory Visit: Payer: Self-pay

## 2014-01-13 MED ORDER — ATENOLOL 50 MG PO TABS: 50.0000 mg | ORAL_TABLET | Freq: Two times a day (BID) | ORAL | Status: DC

## 2014-02-12 ENCOUNTER — Other Ambulatory Visit: Payer: Self-pay

## 2014-02-12 DIAGNOSIS — Z794 Long term (current) use of insulin: Principal | ICD-10-CM

## 2014-02-12 DIAGNOSIS — IMO0001 Reserved for inherently not codable concepts without codable children: Secondary | ICD-10-CM

## 2014-02-12 DIAGNOSIS — E119 Type 2 diabetes mellitus without complications: Principal | ICD-10-CM

## 2014-02-12 MED ORDER — GLIPIZIDE ER 5 MG PO TB24: 5.0000 mg | ORAL_TABLET | Freq: Every day | ORAL | Status: DC

## 2014-02-12 MED ORDER — IRBESARTAN 300 MG PO TABS: 300.0000 mg | ORAL_TABLET | Freq: Every day | ORAL | Status: DC

## 2014-02-16 ENCOUNTER — Other Ambulatory Visit: Payer: Self-pay

## 2014-02-16 DIAGNOSIS — E785 Hyperlipidemia, unspecified: Secondary | ICD-10-CM | POA: Diagnosis not present

## 2014-02-16 DIAGNOSIS — E109 Type 1 diabetes mellitus without complications: Secondary | ICD-10-CM | POA: Diagnosis not present

## 2014-02-16 DIAGNOSIS — E119 Type 2 diabetes mellitus without complications: Principal | ICD-10-CM

## 2014-02-16 DIAGNOSIS — IMO0001 Reserved for inherently not codable concepts without codable children: Secondary | ICD-10-CM

## 2014-02-16 DIAGNOSIS — Z794 Long term (current) use of insulin: Principal | ICD-10-CM

## 2014-02-16 LAB — COMPLETE METABOLIC PANEL WITH GFR
ALBUMIN: 4.1 g/dL (ref 3.5–5.2)
ALT: 15 U/L (ref 0–35)
AST: 15 U/L (ref 0–37)
Alkaline Phosphatase: 77 U/L (ref 39–117)
BUN: 13 mg/dL (ref 6–23)
CALCIUM: 9.6 mg/dL (ref 8.4–10.5)
CO2: 29 mEq/L (ref 19–32)
CREATININE: 0.81 mg/dL (ref 0.50–1.10)
Chloride: 103 mEq/L (ref 96–112)
GFR, Est African American: 83 mL/min
GFR, Est Non African American: 72 mL/min
GLUCOSE: 169 mg/dL — AB (ref 70–99)
POTASSIUM: 4.3 meq/L (ref 3.5–5.3)
Sodium: 141 mEq/L (ref 135–145)
Total Bilirubin: 0.5 mg/dL (ref 0.2–1.2)
Total Protein: 6.4 g/dL (ref 6.0–8.3)

## 2014-02-16 LAB — LIPID PANEL
CHOLESTEROL: 153 mg/dL (ref 0–200)
HDL: 46 mg/dL (ref >39)
LDL Cholesterol: 76 mg/dL (ref 0–99)
Total CHOL/HDL Ratio: 3.3 Ratio
Triglycerides: 155 mg/dL — ABNORMAL HIGH (ref <150)
VLDL: 31 mg/dL (ref 0–40)

## 2014-02-16 LAB — HEMOGLOBIN A1C
Hgb A1c MFr Bld: 8.1 % — ABNORMAL HIGH (ref <5.7)
Mean Plasma Glucose: 186 mg/dL — ABNORMAL HIGH (ref <117)

## 2014-02-16 MED ORDER — GLIPIZIDE ER 5 MG PO TB24: 5.0000 mg | ORAL_TABLET | Freq: Every day | ORAL | Status: DC

## 2014-02-17 ENCOUNTER — Ambulatory Visit (INDEPENDENT_AMBULATORY_CARE_PROVIDER_SITE_OTHER): Payer: Medicare Other | Admitting: Family Medicine

## 2014-02-17 ENCOUNTER — Encounter: Payer: Self-pay | Admitting: Family Medicine

## 2014-02-17 ENCOUNTER — Encounter (INDEPENDENT_AMBULATORY_CARE_PROVIDER_SITE_OTHER): Payer: Self-pay

## 2014-02-17 VITALS — BP 130/74 | HR 77 | Resp 16 | Ht 67.0 in | Wt 189.8 lb

## 2014-02-17 DIAGNOSIS — E669 Obesity, unspecified: Secondary | ICD-10-CM | POA: Diagnosis not present

## 2014-02-17 DIAGNOSIS — E119 Type 2 diabetes mellitus without complications: Principal | ICD-10-CM

## 2014-02-17 DIAGNOSIS — I10 Essential (primary) hypertension: Secondary | ICD-10-CM | POA: Diagnosis not present

## 2014-02-17 DIAGNOSIS — F439 Reaction to severe stress, unspecified: Secondary | ICD-10-CM

## 2014-02-17 DIAGNOSIS — E109 Type 1 diabetes mellitus without complications: Secondary | ICD-10-CM | POA: Diagnosis not present

## 2014-02-17 DIAGNOSIS — IMO0001 Reserved for inherently not codable concepts without codable children: Secondary | ICD-10-CM

## 2014-02-17 DIAGNOSIS — E785 Hyperlipidemia, unspecified: Secondary | ICD-10-CM | POA: Diagnosis not present

## 2014-02-17 DIAGNOSIS — Z794 Long term (current) use of insulin: Principal | ICD-10-CM

## 2014-02-17 DIAGNOSIS — Z639 Problem related to primary support group, unspecified: Secondary | ICD-10-CM

## 2014-02-17 LAB — MICROALBUMIN / CREATININE URINE RATIO
CREATININE, URINE: 128.1 mg/dL
MICROALB/CREAT RATIO: 9.2 mg/g (ref 0.0–30.0)
Microalb, Ur: 1.18 mg/dL (ref 0.00–1.89)

## 2014-02-17 NOTE — Patient Instructions (Addendum)
Annual physical exam in 4 month, call if you need me before  Labs and blood pressure are excellent, except blood sugar is sligthly elevated  Increase lantus dose that you take by 5 units, to 45 units daily  Blood sugar fasting should not be below 90  To 100, if it is, decrease the dose back to 40 units and let me know  Foot exam is good, please we will  make appt for eye exam due in May   HBA1C chem 7 and Egfr, cBc, and TSH non fasting in 4 monh

## 2014-02-20 NOTE — Assessment & Plan Note (Signed)
Unchnaged. Patient re-educated about  the importance of commitment to a  minimum of 150 minutes of exercise per week. The importance of healthy food choices with portion control discussed. Encouraged to start a food diary, count calories and to consider  joining a support group. Sample diet sheets offered. Goals set by the patient for the next several months.    

## 2014-02-20 NOTE — Assessment & Plan Note (Signed)
Controlled, no change in medication DASH diet and commitment to daily physical activity for a minimum of 30 minutes discussed and encouraged, as a part of hypertension management. The importance of attaining a healthy weight is also discussed.  

## 2014-02-20 NOTE — Assessment & Plan Note (Signed)
Triglyceride elevated , lipids otherwise good, no change in medication Hyperlipidemia:Low fat diet discussed and encouraged.

## 2014-02-20 NOTE — Assessment & Plan Note (Signed)
Much improved, spouse  still paranoid but his general demeanor is improved

## 2014-02-20 NOTE — Progress Notes (Signed)
   Subjective:    Patient ID: Kimberly Freeman, female    DOB: Sep 20, 1941, 73 y.o.   MRN: [id number]  HPI The PT is here for follow up and re-evaluation of chronic medical conditions, medication management and review of any available recent lab and radiology data.  Preventive health is updated, specifically  Cancer screening and Immunization.   Questions or concerns regarding consultations or procedures which the PT has had in the interim are  addressed. The PT denies any adverse reactions to current medications since the last visit.  There are no new concerns.  There are no specific complaints  Denies hypoglycemic episodes, tests  Blood sugar twice daily, fasting sugars average around 140 to 150      Review of Systems See HPI Denies recent fever or chills. Denies sinus pressure, nasal congestion, ear pain or sore throat. Denies chest congestion, productive cough or wheezing. Denies chest pains, palpitations and leg swelling Denies abdominal pain, nausea, vomiting,diarrhea or constipation.   Denies dysuria, frequency, hesitancy or incontinence. Denies joint pain, swelling and limitation in mobility. Denies headaches, seizures, numbness, or tingling. Denies depression, anxiety or insomnia. Denies skin break down or rash.        Objective:   Physical Exam  BP 130/74  Pulse 77  Resp 16  Ht 5\' 7"  (1.702 m)  Wt 189 lb 12.8 oz (86.093 kg)  BMI 29.72 kg/m2  SpO2 96% Patient alert and oriented and in no cardiopulmonary distress.  HEENT: No facial asymmetry, EOMI, no sinus tenderness,  oropharynx pink and moist.  Neck supple no adenopathy.  Chest: Clear to auscultation bilaterally.  CVS: S1, S2 no murmurs, no S3.  ABD: Soft non tender. Bowel sounds normal.  Ext: No edema  MS: Adequate ROM spine, shoulders, hips and knees.  Skin: Intact, no ulcerations or rash noted.  Psych: Good eye contact, normal affect. Memory intact not anxious or depressed appearing.  CNS: CN  2-12 intact, power, tone and sensation normal throughout.       Assessment & Plan:  HYPERTENSION Controlled, no change in medication DASH diet and commitment to daily physical activity for a minimum of 30 minutes discussed and encouraged, as a part of hypertension management. The importance of attaining a healthy weight is also discussed.   HYPERLIPIDEMIA Triglyceride elevated , lipids otherwise good, no change in medication Hyperlipidemia:Low fat diet discussed and encouraged.    OBESITY Unchnaged Patient re-educated about  the importance of commitment to a  minimum of 150 minutes of exercise per week. The importance of healthy food choices with portion control discussed. Encouraged to start a food diary, count calories and to consider  joining a support group. Sample diet sheets offered. Goals set by the patient for the next several months.     Stress at home Much improved, spouse  still paranoid but his general demeanor is improved  Diabetes mellitus, insulin dependent (IDDM), controlled Mildly deteriorated, increase in dose of lantus. Continue control in diet as well as regular physical activity, call back if any concerns with blood sugar

## 2014-02-20 NOTE — Assessment & Plan Note (Signed)
Mildly deteriorated, increase in dose of lantus. Continue control in diet as well as regular physical activity, call back if any concerns with blood sugar

## 2014-04-14 ENCOUNTER — Other Ambulatory Visit: Payer: Self-pay

## 2014-04-14 MED ORDER — METFORMIN HCL ER 500 MG PO TB24: 1000.0000 mg | ORAL_TABLET | Freq: Two times a day (BID) | ORAL | Status: DC

## 2014-05-18 DIAGNOSIS — H538 Other visual disturbances: Secondary | ICD-10-CM | POA: Diagnosis not present

## 2014-05-18 DIAGNOSIS — H2510 Age-related nuclear cataract, unspecified eye: Secondary | ICD-10-CM | POA: Diagnosis not present

## 2014-05-18 DIAGNOSIS — E119 Type 2 diabetes mellitus without complications: Secondary | ICD-10-CM | POA: Diagnosis not present

## 2014-05-18 LAB — HM DIABETES EYE EXAM

## 2014-06-14 ENCOUNTER — Other Ambulatory Visit: Payer: Self-pay | Admitting: Family Medicine

## 2014-06-14 DIAGNOSIS — E109 Type 1 diabetes mellitus without complications: Secondary | ICD-10-CM | POA: Diagnosis not present

## 2014-06-14 DIAGNOSIS — I10 Essential (primary) hypertension: Secondary | ICD-10-CM | POA: Diagnosis not present

## 2014-06-14 DIAGNOSIS — D539 Nutritional anemia, unspecified: Secondary | ICD-10-CM | POA: Diagnosis not present

## 2014-06-14 LAB — CBC WITH DIFFERENTIAL/PLATELET
BASOS ABS: 0 10*3/uL (ref 0.0–0.1)
Basophils Relative: 0 % (ref 0–1)
Eosinophils Absolute: 0.2 10*3/uL (ref 0.0–0.7)
Eosinophils Relative: 4 % (ref 0–5)
HEMATOCRIT: 35.3 % — AB (ref 36.0–46.0)
HEMOGLOBIN: 11.7 g/dL — AB (ref 12.0–15.0)
Lymphocytes Relative: 49 % — ABNORMAL HIGH (ref 12–46)
Lymphs Abs: 2.9 10*3/uL (ref 0.7–4.0)
MCH: 29 pg (ref 26.0–34.0)
MCHC: 33.1 g/dL (ref 30.0–36.0)
MCV: 87.4 fL (ref 78.0–100.0)
MONO ABS: 0.6 10*3/uL (ref 0.1–1.0)
MONOS PCT: 10 % (ref 3–12)
NEUTROS ABS: 2.2 10*3/uL (ref 1.7–7.7)
Neutrophils Relative %: 37 % — ABNORMAL LOW (ref 43–77)
Platelets: 251 10*3/uL (ref 150–400)
RBC: 4.04 MIL/uL (ref 3.87–5.11)
RDW: 13.4 % (ref 11.5–15.5)
WBC: 6 10*3/uL (ref 4.0–10.5)

## 2014-06-14 LAB — HEMOGLOBIN A1C
Hgb A1c MFr Bld: 8 % — ABNORMAL HIGH (ref <5.7)
MEAN PLASMA GLUCOSE: 183 mg/dL — AB (ref <117)

## 2014-06-14 LAB — BASIC METABOLIC PANEL WITH GFR
BUN: 13 mg/dL (ref 6–23)
CHLORIDE: 104 meq/L (ref 96–112)
CO2: 27 meq/L (ref 19–32)
Calcium: 9.8 mg/dL (ref 8.4–10.5)
Creat: 0.91 mg/dL (ref 0.50–1.10)
GFR, Est African American: 71 mL/min
GFR, Est Non African American: 62 mL/min
Glucose, Bld: 146 mg/dL — ABNORMAL HIGH (ref 70–99)
Potassium: 4.4 mEq/L (ref 3.5–5.3)
SODIUM: 139 meq/L (ref 135–145)

## 2014-06-14 LAB — TSH: TSH: 3.963 u[IU]/mL (ref 0.350–4.500)

## 2014-06-15 ENCOUNTER — Encounter (INDEPENDENT_AMBULATORY_CARE_PROVIDER_SITE_OTHER): Payer: Self-pay

## 2014-06-15 ENCOUNTER — Encounter: Payer: Self-pay | Admitting: Family Medicine

## 2014-06-15 ENCOUNTER — Ambulatory Visit (INDEPENDENT_AMBULATORY_CARE_PROVIDER_SITE_OTHER): Payer: Medicare Other | Admitting: Family Medicine

## 2014-06-15 VITALS — BP 138/70 | HR 69 | Resp 16 | Ht 67.0 in | Wt 185.4 lb

## 2014-06-15 DIAGNOSIS — E119 Type 2 diabetes mellitus without complications: Secondary | ICD-10-CM

## 2014-06-15 DIAGNOSIS — Z794 Long term (current) use of insulin: Secondary | ICD-10-CM

## 2014-06-15 DIAGNOSIS — E785 Hyperlipidemia, unspecified: Secondary | ICD-10-CM

## 2014-06-15 DIAGNOSIS — Z23 Encounter for immunization: Secondary | ICD-10-CM | POA: Diagnosis not present

## 2014-06-15 DIAGNOSIS — IMO0001 Reserved for inherently not codable concepts without codable children: Secondary | ICD-10-CM

## 2014-06-15 DIAGNOSIS — Z1239 Encounter for other screening for malignant neoplasm of breast: Secondary | ICD-10-CM

## 2014-06-15 DIAGNOSIS — I10 Essential (primary) hypertension: Secondary | ICD-10-CM

## 2014-06-15 DIAGNOSIS — Z1211 Encounter for screening for malignant neoplasm of colon: Secondary | ICD-10-CM | POA: Diagnosis not present

## 2014-06-15 DIAGNOSIS — Z0000 Encounter for general adult medical examination without abnormal findings: Secondary | ICD-10-CM

## 2014-06-15 LAB — IRON: IRON: 81 ug/dL (ref 42–145)

## 2014-06-15 LAB — HEMOCCULT GUIAC POC 1CARD (OFFICE): Fecal Occult Blood, POC: NEGATIVE

## 2014-06-15 LAB — FERRITIN: FERRITIN: 77 ng/mL (ref 10–291)

## 2014-06-15 NOTE — Patient Instructions (Addendum)
F/u in 4 month, call if you need me before  Congrats on excellent labs and blood pressure  Flu vaccine today Fasting lipid, cmp and EGFr and hBA1C in 4 month

## 2014-06-18 ENCOUNTER — Other Ambulatory Visit: Payer: Self-pay

## 2014-06-18 MED ORDER — METFORMIN HCL ER 500 MG PO TB24: 1000.0000 mg | ORAL_TABLET | Freq: Two times a day (BID) | ORAL | Status: DC

## 2014-06-18 MED ORDER — ATENOLOL 50 MG PO TABS
50.0000 mg | ORAL_TABLET | Freq: Two times a day (BID) | ORAL | Status: DC
Start: 2014-06-18 — End: 2015-01-14

## 2014-06-21 DIAGNOSIS — E1149 Type 2 diabetes mellitus with other diabetic neurological complication: Secondary | ICD-10-CM | POA: Diagnosis not present

## 2014-06-21 DIAGNOSIS — M79609 Pain in unspecified limb: Secondary | ICD-10-CM | POA: Diagnosis not present

## 2014-06-21 DIAGNOSIS — B351 Tinea unguium: Secondary | ICD-10-CM | POA: Diagnosis not present

## 2014-06-21 DIAGNOSIS — L609 Nail disorder, unspecified: Secondary | ICD-10-CM | POA: Diagnosis not present

## 2014-06-28 DIAGNOSIS — Z23 Encounter for immunization: Secondary | ICD-10-CM | POA: Insufficient documentation

## 2014-06-28 DIAGNOSIS — Z0000 Encounter for general adult medical examination without abnormal findings: Secondary | ICD-10-CM | POA: Insufficient documentation

## 2014-06-28 NOTE — Assessment & Plan Note (Signed)
Vaccine administered at visit.  

## 2014-06-28 NOTE — Progress Notes (Signed)
   Subjective:    Patient ID: Kimberly Freeman, female    DOB: 10-18-1941, 73 y.o.   MRN: [id number]  HPI Patient is in for pelvic and breast exam. Flu vaccine is administered at this visit and recent lab data reviewed. No changes needed in chronic medcations, there is good control of her chronic illnesses. She has no specific concerns or complaints about her personal health  Review of Systems See HPI     Objective:   Physical Exam BP 138/70  Pulse 69  Resp 16  Ht  (1.702 m)  Wt 185 lb 6.4 oz (84.097 kg)  BMI 29.03 kg/m2  SpO2 97% Pleasant well nourished female, alert and oriented x 3, in no cardio-pulmonary distress. Afebrile.   Breast: No asymetry,no masses or lumps. No tenderness. No nipple discharge or inversion. No axillary or supraclavicular adenopathy    Rectal:  Normal sphincter tone. No mass.No rectal masses.  Guaiac negative stool.  GU: External genitalia normal female genitalia , female distribution of hair. No lesions. Urethral meatus normal in size, no  Prolapse, no lesions visibly  Present. Bladder non tender. Vagina pink and moist , with no visible lesions , no  discharge present . Adequate pelvic support no  cystocele or rectocele noted  Uterus absent, no adnexal masses, no  adnexal tenderness.          Assessment & Plan:  Encounter for annual physical exam .Pelvic and breast exam as documented Counseling done  re healthy lifestyle involving commitment to 150 minutes exercise per week, heart healthy diet, and attaining healthy weight.The importance of adequate sleep also discussed. Regular seat belt use and safe storage  of firearms if patient has them, is also discussed. Changes in health habits are decided on by the patient with goals and time frames  set for achieving them. Immunization and cancer screening needs are specifically addressed at this visit.   Need for prophylactic vaccination and inoculation against influenza Vaccine  administered at visit.

## 2014-06-28 NOTE — Assessment & Plan Note (Signed)
.  Pelvic and breast exam as documented Counseling done  re healthy lifestyle involving commitment to 150 minutes exercise per week, heart healthy diet, and attaining healthy weight.The importance of adequate sleep also discussed. Regular seat belt use and safe storage  of firearms if patient has them, is also discussed. Changes in health habits are decided on by the patient with goals and time frames  set for achieving them. Immunization and cancer screening needs are specifically addressed at this visit.

## 2014-07-15 ENCOUNTER — Other Ambulatory Visit: Payer: Self-pay | Admitting: Family Medicine

## 2014-07-15 DIAGNOSIS — Z1231 Encounter for screening mammogram for malignant neoplasm of breast: Secondary | ICD-10-CM

## 2014-08-05 ENCOUNTER — Ambulatory Visit (HOSPITAL_COMMUNITY)
Admission: RE | Admit: 2014-08-05 | Discharge: 2014-08-05 | Disposition: A | Discharge status: Home / Self Care | Payer: Medicare Other | Source: Ambulatory Visit | Attending: Family Medicine | Admitting: Family Medicine

## 2014-08-05 DIAGNOSIS — Z1231 Encounter for screening mammogram for malignant neoplasm of breast: Secondary | ICD-10-CM | POA: Insufficient documentation

## 2014-08-18 ENCOUNTER — Other Ambulatory Visit: Payer: Self-pay

## 2014-08-18 DIAGNOSIS — Z794 Long term (current) use of insulin: Principal | ICD-10-CM

## 2014-08-18 DIAGNOSIS — E119 Type 2 diabetes mellitus without complications: Principal | ICD-10-CM

## 2014-08-18 DIAGNOSIS — IMO0001 Reserved for inherently not codable concepts without codable children: Secondary | ICD-10-CM

## 2014-08-18 MED ORDER — BENAZEPRIL HCL 40 MG PO TABS
40.0000 mg | ORAL_TABLET | ORAL | Status: DC
Start: 2014-08-18 — End: 2015-03-11

## 2014-08-18 MED ORDER — GLIPIZIDE ER 5 MG PO TB24: 5.0000 mg | ORAL_TABLET | Freq: Every day | ORAL | Status: DC

## 2014-08-18 MED ORDER — IRBESARTAN 300 MG PO TABS: 300.0000 mg | ORAL_TABLET | Freq: Every day | ORAL | Status: DC

## 2014-08-18 MED ORDER — METFORMIN HCL ER 500 MG PO TB24: 1000.0000 mg | ORAL_TABLET | Freq: Two times a day (BID) | ORAL | Status: DC

## 2014-08-30 DIAGNOSIS — B353 Tinea pedis: Secondary | ICD-10-CM | POA: Diagnosis not present

## 2014-08-30 DIAGNOSIS — L609 Nail disorder, unspecified: Secondary | ICD-10-CM | POA: Diagnosis not present

## 2014-08-30 DIAGNOSIS — E109 Type 1 diabetes mellitus without complications: Secondary | ICD-10-CM | POA: Diagnosis not present

## 2014-08-30 DIAGNOSIS — B351 Tinea unguium: Secondary | ICD-10-CM | POA: Diagnosis not present

## 2014-10-11 DIAGNOSIS — E785 Hyperlipidemia, unspecified: Secondary | ICD-10-CM | POA: Diagnosis not present

## 2014-10-11 DIAGNOSIS — E109 Type 1 diabetes mellitus without complications: Secondary | ICD-10-CM | POA: Diagnosis not present

## 2014-10-11 LAB — COMPLETE METABOLIC PANEL WITH GFR
ALBUMIN: 4 g/dL (ref 3.5–5.2)
ALT: 16 U/L (ref 0–35)
AST: 17 U/L (ref 0–37)
Alkaline Phosphatase: 71 U/L (ref 39–117)
BUN: 12 mg/dL (ref 6–23)
CALCIUM: 9.4 mg/dL (ref 8.4–10.5)
CO2: 25 meq/L (ref 19–32)
Chloride: 107 mEq/L (ref 96–112)
Creat: 0.85 mg/dL (ref 0.50–1.10)
GFR, EST AFRICAN AMERICAN: 78 mL/min
GFR, EST NON AFRICAN AMERICAN: 67 mL/min
GLUCOSE: 137 mg/dL — AB (ref 70–99)
Potassium: 4.7 mEq/L (ref 3.5–5.3)
SODIUM: 143 meq/L (ref 135–145)
TOTAL PROTEIN: 6.3 g/dL (ref 6.0–8.3)
Total Bilirubin: 0.4 mg/dL (ref 0.2–1.2)

## 2014-10-11 LAB — LIPID PANEL
Cholesterol: 145 mg/dL (ref 0–200)
HDL: 41 mg/dL (ref >39)
LDL Cholesterol: 81 mg/dL (ref 0–99)
Total CHOL/HDL Ratio: 3.5 Ratio
Triglycerides: 117 mg/dL (ref <150)
VLDL: 23 mg/dL (ref 0–40)

## 2014-10-11 LAB — HEMOGLOBIN A1C
Hgb A1c MFr Bld: 7.6 % — ABNORMAL HIGH (ref <5.7)
Mean Plasma Glucose: 171 mg/dL — ABNORMAL HIGH (ref <117)

## 2014-10-12 ENCOUNTER — Encounter: Payer: Self-pay | Admitting: Family Medicine

## 2014-10-12 ENCOUNTER — Ambulatory Visit (INDEPENDENT_AMBULATORY_CARE_PROVIDER_SITE_OTHER): Payer: Medicare Other | Admitting: Family Medicine

## 2014-10-12 ENCOUNTER — Encounter (INDEPENDENT_AMBULATORY_CARE_PROVIDER_SITE_OTHER): Payer: Self-pay

## 2014-10-12 VITALS — BP 138/70 | HR 76 | Resp 16 | Ht 67.0 in | Wt 188.4 lb

## 2014-10-12 DIAGNOSIS — Z23 Encounter for immunization: Secondary | ICD-10-CM

## 2014-10-12 DIAGNOSIS — I10 Essential (primary) hypertension: Secondary | ICD-10-CM

## 2014-10-12 DIAGNOSIS — Z794 Long term (current) use of insulin: Secondary | ICD-10-CM

## 2014-10-12 DIAGNOSIS — E109 Type 1 diabetes mellitus without complications: Secondary | ICD-10-CM | POA: Diagnosis not present

## 2014-10-12 DIAGNOSIS — E119 Type 2 diabetes mellitus without complications: Secondary | ICD-10-CM

## 2014-10-12 DIAGNOSIS — M25512 Pain in left shoulder: Secondary | ICD-10-CM | POA: Diagnosis not present

## 2014-10-12 DIAGNOSIS — E785 Hyperlipidemia, unspecified: Secondary | ICD-10-CM

## 2014-10-12 DIAGNOSIS — F439 Reaction to severe stress, unspecified: Secondary | ICD-10-CM

## 2014-10-12 DIAGNOSIS — Z638 Other specified problems related to primary support group: Secondary | ICD-10-CM

## 2014-10-12 DIAGNOSIS — IMO0001 Reserved for inherently not codable concepts without codable children: Secondary | ICD-10-CM

## 2014-10-12 DIAGNOSIS — R9431 Abnormal electrocardiogram [ECG] [EKG]: Secondary | ICD-10-CM | POA: Diagnosis not present

## 2014-10-12 MED ORDER — PREDNISONE (PAK) 5 MG PO TABS: 5.0000 mg | ORAL_TABLET | ORAL | Status: DC

## 2014-10-12 MED ORDER — IBUPROFEN 800 MG PO TABS: ORAL_TABLET | ORAL | Status: DC

## 2014-10-12 MED ORDER — METHYLPREDNISOLONE ACETATE 80 MG/ML IJ SUSP
80.0000 mg | Freq: Once | INTRAMUSCULAR | Status: AC
  Administered 2014-10-12: 80 mg via INTRAMUSCULAR

## 2014-10-12 MED ORDER — KETOROLAC TROMETHAMINE 60 MG/2ML IM SOLN
60.0000 mg | Freq: Once | INTRAMUSCULAR | Status: AC
Start: 2014-10-12 — End: 2014-10-12
  Administered 2014-10-12: 60 mg via INTRAMUSCULAR

## 2014-10-12 NOTE — Patient Instructions (Addendum)
F/u April 30 or after, call if you need me befoore Toradol and depo medrol in office today for left shouldetr pain, call if persitent reduced mobility , you will be referred to ortho, medications have been sent to [facility]  EKG in office today, not totally normal , and due to risk factors for CAD , you are referred for cardiology evaluation  Consider need for cardiology evaluationg based on being diabetic, let me know if you decide to go, i will alsosend general msg to Doc as I discussed    Prevnar today  Congrats on improved blood sugar  Microalb, non fasting  fasting l cmp and eGFr , hBa1C and microalb April 29 or after

## 2014-10-18 DIAGNOSIS — Z23 Encounter for immunization: Secondary | ICD-10-CM | POA: Insufficient documentation

## 2014-10-18 DIAGNOSIS — R9431 Abnormal electrocardiogram [ECG] [EKG]: Secondary | ICD-10-CM | POA: Insufficient documentation

## 2014-10-18 NOTE — Assessment & Plan Note (Signed)
Vaccine administered at visit.  

## 2014-10-18 NOTE — Assessment & Plan Note (Signed)
Acute flare, toradol and depo medrol in office followed by short cpourse of prednisone, pt to see ortho if pain and limitation in mobility persist

## 2014-10-18 NOTE — Assessment & Plan Note (Signed)
Improved, pr applauded on this, no changes in meds Patient advised to reduce carb and sweets, commit to regular physical activity, take meds as prescribed, test blood as directed, and attempt to lose weight, to improve blood sugar control.

## 2014-10-18 NOTE — Progress Notes (Signed)
   Subjective:    Patient ID: Mauro KaufmannAnne M Massey, female    DOB: 10-Apr-1941, 73 y.o.   MRN: [id number]  HPI  The PT is here for follow up and re-evaluation of chronic medical conditions, medication management and review of any available recent lab and radiology data.  Preventive health is updated, specifically  Cancer screening and Immunization.   Questions or concerns regarding consultations or procedures which the PT has had in the interim are  addressed. The PT denies any adverse reactions to current medications since the last visit.  Left shoulder pain and decreased mobility for thew past 3 weeks, no trauma Denies polyuria, polydipsia, blurred vision , or hypoglycemic episodes.    Review of Systems See HPI Denies recent fever or chills. Denies sinus pressure, nasal congestion, ear pain or sore throat. Denies chest congestion, productive cough or wheezing. Denies PND,orthopnea palpitations and leg swelling Denies abdominal pain, nausea, vomiting,diarrhea or constipation.   Denies dysuria, frequency, hesitancy or incontinence. Denies headaches, seizures, numbness, or tingling. Denies depression, anxiety or insomnia. Denies skin break down or rash.        Objective:   Physical Exam  BP 138/70 mmHg  Pulse 76  Resp 16  Ht 5\' 7"  (1.702 m)  Wt 188 lb 6.4 oz (85.458 kg)  BMI 29.50 kg/m2  SpO2 96% Patient alert and oriented and in no cardiopulmonary distress.  HEENT: No facial asymmetry, EOMI,   oropharynx pink and moist.  Neck supple no JVD, no mass.  Chest: Clear to auscultation bilaterally.  CVS: S1, S2 no murmurs, no S3.Regular rate.  ABD: Soft non tender.   Ext: No edema  MS: Adequate ROM spine,, hips and knees.Decreased ROM left shoulder with swelling in anterior aspect  Skin: Intact, no ulcerations or rash noted.  Psych: Good eye contact, normal affect. Memory intact not anxious or depressed appearing.  CNS: CN 2-12 intact, power,  normal throughout.no focal  deficits noted.       Assessment & Plan:  Shoulder pain, left Acute flare, toradol and depo medrol in office followed by short cpourse of prednisone, pt to see ortho if pain and limitation in mobility persist  Essential hypertension Controlled, no change in medication DASH diet and commitment to daily physical activity for a minimum of 30 minutes discussed and encouraged, as a part of hypertension management. The importance of attaining a healthy weight is also discussed.   Diabetes mellitus, insulin dependent (IDDM), controlled Improved, pr applauded on this, no changes in meds Patient advised to reduce carb and sweets, commit to regular physical activity, take meds as prescribed, test blood as directed, and attempt to lose weight, to improve blood sugar control.   Hyperlipidemia LDL goal <100 Controlled, no change in medication Hyperlipidemia:Low fat diet discussed and encouraged.    Need for prophylactic vaccination and inoculation against influenza    Nonspecific abnormal electrocardiogram (ECG) (EKG) Pt has no specific c/o chest pain or fatigue , however, "baseline EKG" suggests possibility of ischemia, she has multiple CV risk factors, and should have the benefit of cardiology evaluation, referral will be made for the New year  Stress at home Improved as spouse is more accepting of the need for medication for dementia and behavioral issues  Need for vaccination with 13-polyvalent pneumococcal conjugate vaccine Vaccine administered at visit.

## 2014-10-18 NOTE — Assessment & Plan Note (Signed)
Improved as spouse is more accepting of the need for medication for dementia and behavioral issues

## 2014-10-18 NOTE — Assessment & Plan Note (Signed)
Pt has no specific c/o chest pain or fatigue , however, "baseline EKG" suggests possibility of ischemia, she has multiple CV risk factors, and should have the benefit of cardiology evaluation, referral will be made for the New year

## 2014-10-18 NOTE — Assessment & Plan Note (Signed)
Controlled, no change in medication DASH diet and commitment to daily physical activity for a minimum of 30 minutes discussed and encouraged, as a part of hypertension management. The importance of attaining a healthy weight is also discussed.  

## 2014-10-18 NOTE — Assessment & Plan Note (Signed)
Controlled, no change in medication Hyperlipidemia:Low fat diet discussed and encouraged.  \ 

## 2014-10-25 ENCOUNTER — Encounter: Payer: Self-pay | Admitting: Family Medicine

## 2014-11-10 DIAGNOSIS — L609 Nail disorder, unspecified: Secondary | ICD-10-CM | POA: Diagnosis not present

## 2014-11-10 DIAGNOSIS — E109 Type 1 diabetes mellitus without complications: Secondary | ICD-10-CM | POA: Diagnosis not present

## 2014-11-10 DIAGNOSIS — B353 Tinea pedis: Secondary | ICD-10-CM | POA: Diagnosis not present

## 2014-11-10 DIAGNOSIS — B351 Tinea unguium: Secondary | ICD-10-CM | POA: Diagnosis not present

## 2014-11-16 ENCOUNTER — Ambulatory Visit: Payer: Medicare Other | Admitting: Cardiovascular Disease

## 2014-11-26 ENCOUNTER — Other Ambulatory Visit: Payer: Self-pay | Admitting: Family Medicine

## 2014-12-13 ENCOUNTER — Encounter: Payer: Self-pay | Admitting: Cardiovascular Disease

## 2014-12-13 ENCOUNTER — Ambulatory Visit (INDEPENDENT_AMBULATORY_CARE_PROVIDER_SITE_OTHER): Payer: Medicare Other | Admitting: Cardiovascular Disease

## 2014-12-13 VITALS — BP 148/76 | HR 69 | Ht 67.0 in | Wt 188.0 lb

## 2014-12-13 DIAGNOSIS — E1165 Type 2 diabetes mellitus with hyperglycemia: Secondary | ICD-10-CM | POA: Diagnosis not present

## 2014-12-13 DIAGNOSIS — E785 Hyperlipidemia, unspecified: Secondary | ICD-10-CM

## 2014-12-13 DIAGNOSIS — R9431 Abnormal electrocardiogram [ECG] [EKG]: Secondary | ICD-10-CM

## 2014-12-13 DIAGNOSIS — I10 Essential (primary) hypertension: Secondary | ICD-10-CM

## 2014-12-13 NOTE — Progress Notes (Signed)
Patient ID: Kimberly Freeman, female   DOB: 01/02/1941, 74 y.o.   MRN: [id number]       CARDIOLOGY CONSULT NOTE  Patient ID: Kimberly Freeman MRN: [id number] DOB/AGE: 10-26-1940 74 y.o.  Admit date: (Not on file) Primary Physician Syliva Overman, MD  Reason for Consultation: abnormal ECG  HPI: The patient is a 74 year old woman with a past medical history significant for hypertension, type 2 diabetes mellitus, and mixed dyslipidemia. She had a routine screening ECG at her primary care physician's office which was deemed abnormal for which she is referred today. ECG performed in late December 2015 demonstrated normal sinus rhythm with T-wave inversions in leads I, aVL, with very mild T wave inversions in V6. With respect to relevant blood tests, HbA1c was 7.6% on 10/11/14. A lipid panel on this same day demonstrated total cholesterol 145, triglycerides 117, HDL 41, LDL 81.  She denies chest pain, shortness of breath, leg swelling, orthopnea, dizziness, lightheadedness, syncope, and paroxysmal nocturnal dyspnea. She has had infrequent palpitations for the past 25 years which have not been bothersome to her. She rides a stationary bicycle for approximately one hour 2 days a week. She has not noted a decrease in her exercise tolerance over the past one year.    Allergies  Allergen Reactions  . Poison Oak Extract [employer] Poison Oak] Hives  . Sulfonamide Derivatives Itching    Burning sensation all over body    Current Outpatient Prescriptions  Medication Sig Dispense Refill  . acetaminophen (TYLENOL) 500 MG tablet Take 500 mg by mouth as needed. For pain    . aspirin 325 MG tablet Take 325 mg by mouth every morning.    Marland Kitchen atenolol (TENORMIN) 50 MG tablet Take 1 tablet (50 mg total) by mouth 2 (two) times daily. 180 tablet 1  . atorvastatin (LIPITOR) 40 MG tablet Take 1 tablet (40 mg total) by mouth at bedtime. 90 tablet 1  . benazepril (LOTENSIN) 40 MG tablet Take 1 tablet (40 mg  total) by mouth every morning. Take one tab daily 90 tablet 1  . clotrimazole-betamethasone (LOTRISONE) cream Apply topically as directed. 45 g 1  . glipiZIDE (GLIPIZIDE XL) 5 MG 24 hr tablet Take 1 tablet (5 mg total) by mouth daily. 90 tablet 1  . GLUCOPHAGE XR 500 MG 24 hr tablet TAKE 2 TABLETS BY MOUTH TWICE DAILY 360 tablet 0  . glucose blood test strip (Onetouch Ultrablue ) Use as instructed for twice daily testing 300 each 1  . ibuprofen (ADVIL,MOTRIN) 800 MG tablet One tablet twice daily 14 tablet 0  . insulin glargine (LANTUS) 100 UNIT/ML injection Inject 40 Units into the skin at bedtime. *inject 55 units ONLY if sugar levels are elevated*    . Insulin Pen Needle (NOVOFINE) 30G X 8 MM MISC Inject 1 packet into the skin as directed.      . irbesartan (AVAPRO) 300 MG tablet Take 1 tablet (300 mg total) by mouth daily. 90 tablet 1  . Lancets (ONETOUCH ULTRASOFT) lancets Use as instructed for twice daily testing 300 each 1  . predniSONE (STERAPRED UNI-PAK) 5 MG TABS tablet Take 1 tablet (5 mg total) by mouth as directed. 21 tablet 0  . spironolactone (ALDACTONE) 25 MG tablet Take 0.5 tablets (12.5 mg total) by mouth once. 90 tablet 3   No current facility-administered medications for this visit.    Past Medical History  Diagnosis Date  . GERD (gastroesophageal reflux disease)   . Obesity   . Hyperlipidemia   .  Diabetes mellitus, type 2   . Hypertension     Past Surgical History  Procedure Laterality Date  . Abdominal hysterectomy    . Cholecystectomy  2004    History   Social History  . Marital Status: Married    Spouse Name: N/A  . Number of Children: N/A  . Years of Education: N/A   Occupational History  . Not on file.   Social History Main Topics  . Smoking status: Never Smoker   . Smokeless tobacco: Never Used  . Alcohol Use: No  . Drug Use: No  . Sexual Activity:    Partners: Male   Other Topics Concern  . Not on file   Social History Narrative      No family history of premature CAD in 1st degree relatives.  Prior to Admission medications   Medication Sig Start Date End Date Taking? Authorizing Provider  acetaminophen (TYLENOL) 500 MG tablet Take 500 mg by mouth as needed. For pain   Yes Historical Provider, MD  aspirin 325 MG tablet Take 325 mg by mouth every morning.   Yes Historical Provider, MD  atenolol (TENORMIN) 50 MG tablet Take 1 tablet (50 mg total) by mouth 2 (two) times daily. 06/18/14  Yes [name], MD  atorvastatin (LIPITOR) 40 MG tablet Take 1 tablet (40 mg total) by mouth at bedtime. 01/12/14  Yes [name], MD  benazepril (LOTENSIN) 40 MG tablet Take 1 tablet (40 mg total) by mouth every morning. Take one tab daily 08/18/14  Yes [name], MD  clotrimazole-betamethasone (LOTRISONE) cream Apply topically as directed. 05/05/12  Yes [name], MD  glipiZIDE (GLIPIZIDE XL) 5 MG 24 hr tablet Take 1 tablet (5 mg total) by mouth daily. 08/18/14 08/18/15 Yes [name], MD  GLUCOPHAGE XR 500 MG 24 hr tablet TAKE 2 TABLETS BY MOUTH TWICE DAILY 11/26/14  Yes [name], MD  glucose blood test strip (Onetouch Ultrablue ) Use as instructed for twice daily testing 05/25/11  Yes [name], MD  ibuprofen (ADVIL,MOTRIN) 800 MG tablet One tablet twice daily 10/12/14  Yes [name], MD  insulin glargine (LANTUS) 100 UNIT/ML injection Inject 40 Units into the skin at bedtime. *inject 55 units ONLY if sugar levels are elevated* 05/25/11 06/05/15 Yes [name], MD  Insulin Pen Needle (NOVOFINE) 30G X 8 MM MISC Inject 1 packet into the skin as directed.     Yes Historical Provider, MD  irbesartan (AVAPRO) 300 MG tablet Take 1 tablet (300 mg total) by mouth daily. 08/18/14  Yes [name], MD  Lancets [facility] ULTRASOFT) lancets Use as instructed for twice daily testing 05/25/11  Yes [name], MD  predniSONE (STERAPRED UNI-PAK) 5 MG TABS tablet Take 1  tablet (5 mg total) by mouth as directed. 10/12/14  Yes [name], MD  spironolactone (ALDACTONE) 25 MG tablet Take 0.5 tablets (12.5 mg total) by mouth once. 11/25/13  Yes [name], MD     Review of systems complete and found to be negative unless listed above in HPI     Physical exam Height  (1.702 m), weight 188 lb (85.276 kg).   BP 148/76  Pulse 69 SpO2 98%   General: NAD Neck: No JVD, no thyromegaly or thyroid nodule.  Lungs: Clear to auscultation bilaterally with normal respiratory effort. CV: Nondisplaced PMI. Regular rate and rhythm, normal S1/S2, no S3/S4, no murmur.  No peripheral edema.  No carotid  bruit.  Normal pedal pulses.  Abdomen: Soft, nontender, no hepatosplenomegaly, no distention.  Skin: Intact without lesions or rashes.  Neurologic: Alert and oriented x 3.  Psych: Normal affect. Extremities: No clubbing or cyanosis.  HEENT: Normal.   ECG: Most recent ECG reviewed.  Labs:   Lab Results  Component Value Date   WBC 6.0 06/14/2014   HGB 11.7* 06/14/2014   HCT 35.3* 06/14/2014   MCV 87.4 06/14/2014   PLT 251 06/14/2014   No results for input(s): NA, K, CL, CO2, BUN, CREATININE, CALCIUM, PROT, BILITOT, ALKPHOS, ALT, AST, GLUCOSE in the last 168 hours.  Invalid input(s): LABALBU No results found for: CKTOTAL, CKMB, CKMBINDEX, TROPONINI  Lab Results  Component Value Date   CHOL 145 10/11/2014   CHOL 153 02/16/2014   CHOL 168 06/03/2013   Lab Results  Component Value Date   HDL 41 10/11/2014   HDL 46 02/16/2014   HDL 48 06/03/2013   Lab Results  Component Value Date   LDLCALC 81 10/11/2014   LDLCALC 76 02/16/2014   LDLCALC 93 06/03/2013   Lab Results  Component Value Date   TRIG 117 10/11/2014   TRIG 155* 02/16/2014   TRIG 135 06/03/2013   Lab Results  Component Value Date   CHOLHDL 3.5 10/11/2014   CHOLHDL 3.3 02/16/2014   CHOLHDL 3.5 06/03/2013   No results found for: LDLDIRECT       Studies: No  results found.  ASSESSMENT AND PLAN:  1. Abnormal ECG: While she does have some ECG abnormalities suspicious for lateral and high lateral ischemia, she denies any cardiovascular symptoms altogether with respect to chest pain, exertional dyspnea, and fatigue. Her exercise tolerance has been stable over the past one year. She has no pathologic findings on physical examination. I informed her that should she begin to experience a decline in exercise tolerance or develop symptoms of chest pain or shortness of breath, I would then recommend exercise treadmill stress testing. For the time being, aggressive risk factor modification is advised with optimal glycemic and blood pressure control.  2. Essential hypertension: Mildly elevated today. If it remains persistently elevated, I would recommend antihypertensive titration. She is on an ARB meeting guideline recommendations in a patient with diabetes.  3. Hyperlipidemia: Well controlled on Lipitor 40 mg. No changes.  4. Type II diabetes mellitus: Elevated HbA1C in 09/2014. Should ideally be closer to 6.5% or less if possible. On an ARB. Defer to PCP for management.  Dispo: f/u prn.  Signed: Prentice DockerSuresh Eleena Grater, M.D., F.A.C.C.  12/13/2014, 9:08 AM

## 2014-12-13 NOTE — Patient Instructions (Signed)
Your physician recommends that you schedule a follow-up appointment As Needed.   Your physician recommends that you continue on your current medications as directed. Please refer to the Current Medication list given to you today.  Thank you for choosing [facility]!    

## 2015-01-14 ENCOUNTER — Other Ambulatory Visit: Payer: Self-pay | Admitting: Family Medicine

## 2015-02-11 DIAGNOSIS — B351 Tinea unguium: Secondary | ICD-10-CM | POA: Diagnosis not present

## 2015-02-11 DIAGNOSIS — E1342 Other specified diabetes mellitus with diabetic polyneuropathy: Secondary | ICD-10-CM | POA: Diagnosis not present

## 2015-02-23 ENCOUNTER — Ambulatory Visit: Payer: Medicare Other | Admitting: Family Medicine

## 2015-03-11 ENCOUNTER — Other Ambulatory Visit: Payer: Self-pay | Admitting: Family Medicine

## 2015-03-14 ENCOUNTER — Other Ambulatory Visit: Payer: Self-pay | Admitting: Family Medicine

## 2015-03-14 DIAGNOSIS — E119 Type 2 diabetes mellitus without complications: Secondary | ICD-10-CM

## 2015-03-14 DIAGNOSIS — E785 Hyperlipidemia, unspecified: Secondary | ICD-10-CM

## 2015-03-14 DIAGNOSIS — Z794 Long term (current) use of insulin: Secondary | ICD-10-CM

## 2015-03-14 DIAGNOSIS — E109 Type 1 diabetes mellitus without complications: Secondary | ICD-10-CM | POA: Diagnosis not present

## 2015-03-14 DIAGNOSIS — IMO0001 Reserved for inherently not codable concepts without codable children: Secondary | ICD-10-CM

## 2015-03-14 LAB — COMPLETE METABOLIC PANEL WITH GFR
ALBUMIN: 4.1 g/dL (ref 3.5–5.2)
ALK PHOS: 65 U/L (ref 39–117)
ALT: 19 U/L (ref 0–35)
AST: 17 U/L (ref 0–37)
BUN: 13 mg/dL (ref 6–23)
CHLORIDE: 105 meq/L (ref 96–112)
CO2: 26 mEq/L (ref 19–32)
Calcium: 9.5 mg/dL (ref 8.4–10.5)
Creat: 0.87 mg/dL (ref 0.50–1.10)
GFR, Est African American: 75 mL/min
GFR, Est Non African American: 65 mL/min
Glucose, Bld: 172 mg/dL — ABNORMAL HIGH (ref 70–99)
Potassium: 4.3 mEq/L (ref 3.5–5.3)
Sodium: 142 mEq/L (ref 135–145)
TOTAL PROTEIN: 6.5 g/dL (ref 6.0–8.3)
Total Bilirubin: 0.5 mg/dL (ref 0.2–1.2)

## 2015-03-14 LAB — MICROALBUMIN / CREATININE URINE RATIO
Creatinine, Urine: 147.3 mg/dL
MICROALB UR: 1.9 mg/dL (ref <2.0)
Microalb Creat Ratio: 12.9 mg/g (ref 0.0–30.0)

## 2015-03-14 LAB — HEMOGLOBIN A1C
HEMOGLOBIN A1C: 7.9 % — AB (ref <5.7)
MEAN PLASMA GLUCOSE: 180 mg/dL — AB (ref <117)

## 2015-03-15 ENCOUNTER — Encounter: Payer: Self-pay | Admitting: Family Medicine

## 2015-03-15 ENCOUNTER — Ambulatory Visit (INDEPENDENT_AMBULATORY_CARE_PROVIDER_SITE_OTHER): Payer: Medicare Other | Admitting: Family Medicine

## 2015-03-15 VITALS — BP 140/64 | HR 72 | Resp 18 | Ht 67.0 in | Wt 188.0 lb

## 2015-03-15 DIAGNOSIS — E663 Overweight: Secondary | ICD-10-CM | POA: Diagnosis not present

## 2015-03-15 DIAGNOSIS — E109 Type 1 diabetes mellitus without complications: Secondary | ICD-10-CM | POA: Diagnosis not present

## 2015-03-15 DIAGNOSIS — I10 Essential (primary) hypertension: Secondary | ICD-10-CM | POA: Diagnosis not present

## 2015-03-15 DIAGNOSIS — E785 Hyperlipidemia, unspecified: Secondary | ICD-10-CM | POA: Diagnosis not present

## 2015-03-15 DIAGNOSIS — E119 Type 2 diabetes mellitus without complications: Secondary | ICD-10-CM

## 2015-03-15 DIAGNOSIS — Z794 Long term (current) use of insulin: Secondary | ICD-10-CM

## 2015-03-15 DIAGNOSIS — IMO0001 Reserved for inherently not codable concepts without codable children: Secondary | ICD-10-CM

## 2015-03-15 MED ORDER — INSULIN GLARGINE 100 UNIT/ML SOLOSTAR PEN: 50.0000 [IU] | PEN_INJECTOR | Freq: Every day | SUBCUTANEOUS | Status: DC

## 2015-03-15 NOTE — Progress Notes (Signed)
Subjective:    Patient ID: Kimberly Freeman, female    DOB: 06-Jun-1941, 74 y.o.   MRN: [id number]  HPI The PT is here for follow up and re-evaluation of chronic medical conditions, medication management and review of any available recent lab and radiology data.  Preventive health is updated, specifically  Cancer screening and Immunization.   Questions or concerns regarding consultations or procedures which the PT has had in the interim are  addressed. The PT denies any adverse reactions to current medications since the last visit.  There are no new concerns.  There are no specific complaints  Denies polyuria, polydipsia, blurred vision , or hypoglycemic episodes. Has not been as diligent with healthy lifestyle in the past 4 to 6 weeks but plans to change this      Review of Systems See HPI Denies recent fever or chills. Denies sinus pressure, nasal congestion, ear pain or sore throat. Denies chest congestion, productive cough or wheezing. Denies chest pains, palpitations and leg swelling Denies abdominal pain, nausea, vomiting,diarrhea or constipation.   Denies dysuria, frequency, hesitancy or incontinence. Denies uncontrolled  joint pain, swelling and limitation in mobility. Denies headaches, seizures, numbness, or tingling. Denies depression, anxiety or insomnia. Denies skin break down or rash.        Objective:   Physical Exam BP 140/64 mmHg  Pulse 72  Resp 18  Ht  (1.702 m)  Wt 188 lb 0.6 oz (85.294 kg)  BMI 29.44 kg/m2  SpO2 99% Patient alert and oriented and in no cardiopulmonary distress.  HEENT: No facial asymmetry, EOMI,   oropharynx pink and moist.  Neck supple no JVD, no mass.  Chest: Clear to auscultation bilaterally.  CVS: S1, S2 no murmurs, no S3.Regular rate.  ABD: Soft non tender.   Ext: No edema  MS: Adequate ROM spine, shoulders, hips and knees.  Skin: Intact, no ulcerations or rash noted.  Psych: Good eye contact, normal affect.  Memory intact not anxious or depressed appearing.  CNS: CN 2-12 intact, power,  normal throughout.no focal deficits noted.        Assessment & Plan:  Essential hypertension Sub optimal control No mmed change  DASH diet and commitment to daily physical activity for a minimum of 30 minutes discussed and encouraged, as a part of hypertension management. The importance of attaining a healthy weight is also discussed.  BP/Weight 03/15/2015 12/13/2014 10/12/2014 06/15/2014 02/17/2014 11/23/2013 09/22/2013  Systolic BP 140 148 138 138 130 160 158  Diastolic BP 64 76 70 70 74 82 82  Wt. (Lbs) 188.04 188 188.4 185.4 189.8 188.12 190.08  BMI 29.44 29.44 29.5 29.03 29.72 29.46 29.76        Diabetes mellitus, insulin dependent (IDDM), controlled Deteriorated but adequately controlled [name] is reminded of the importance of commitment to daily physical activity for 30 minutes or more, as able and the need to limit carbohydrate intake to 30 to 60 grams per meal to help with blood sugar control.   The need to take medication as prescribed, test blood sugar as directed, and to call between visits if there is a concern that blood sugar is uncontrolled is also discussed.   Ms. Worst is reminded of the importance of daily foot exam, annual eye examination, and good blood sugar, blood pressure and cholesterol control.  Diabetic Labs Latest Ref Rng 03/14/2015 10/11/2014 06/14/2014 02/16/2014 12/22/2013  HbA1c <5.7 % 7.9(H) 7.6(H) 8.0(H) 8.1(H) -  Microalbumin <2.0 mg/dL 1.9 - - 7.82 -  Micro/Creat Ratio 0.0 - 30.0 mg/g 12.9 - - 9.2 -  Chol 0 - 200 mg/dL 213(Y211(H) 865145 - 784153 -  HDL >=46 mg/dL 49 41 - 46 -  Calc LDL 0 - 99 mg/dL 696(E130(H) 81 - 76 -  Triglycerides <150 mg/dL 952(W161(H) 413117 - 244(W155(H) -  Creatinine 0.50 - 1.10 mg/dL 1.020.87 7.250.85 3.660.91 4.400.81 3.470.84   BP/Weight 03/15/2015 12/13/2014 10/12/2014 06/15/2014 02/17/2014 11/23/2013 09/22/2013  Systolic BP 140 148 138 138 130 160 158  Diastolic BP 64 76 70 70 74 82 82    Wt. (Lbs) 188.04 188 188.4 185.4 189.8 188.12 190.08  BMI 29.44 29.44 29.5 29.03 29.72 29.46 29.76   Foot/eye exam completion dates Latest Ref Rng 03/15/2015 05/18/2014  Eye Exam No Retinopathy - No Retinopathy  Foot exam Order - - -  Foot Form Completion - Done -         Hyperlipidemia LDL goal <100 Deteriorated Hyperlipidemia:Low fat diet discussed and encouraged.   Lipid Panel  Lab Results  Component Value Date   CHOL 211* 03/14/2015   HDL 49 03/14/2015   LDLCALC 130* 03/14/2015   TRIG 161* 03/14/2015   CHOLHDL 4.3 03/14/2015      Updated lab needed at/ before next visit.   Overweight (BMI 25.0-29.9) Unchanged Patient re-educated about  the importance of commitment to a  minimum of 150 minutes of exercise per week.  The importance of healthy food choices with portion control discussed. Encouraged to start a food diary, count calories and to consider  joining a support group. Sample diet sheets offered. Goals set by the patient for the next several months.   Weight /BMI 03/15/2015 12/13/2014 10/12/2014  WEIGHT 188 lb 0.6 oz 188 lb 188 lb 6.4 oz  HEIGHT 5\' 7"  5\' 7"  5\' 7"   BMI 29.44 kg/m2 29.44 kg/m2 29.5 kg/m2    Current exercise per week 90 minutes.

## 2015-03-15 NOTE — Patient Instructions (Signed)
Annual wellness with rectal in 4 month, call if you need me before please  Please bring your meter so we can see if working properly this week  Increase lantus dose to 45 units daily, new script says 50 units BUT START with 45 units increase only after speaking with [place] , if needed  Resume daily exercise and test twice daily  Goal for fasting blood sugar ranges from 90 to 130 and 2 hours after any meal or at bedtime should be between 140 to 180.  HBA1C, chem 7 and EGFR in 4 months please  Please work on good  health habits so that your health will improve. 1. Commitment to daily physical activity for 30 to 60  minutes, if you are able to do this.  2. Commitment to wise food choices. Aim for half of your  food intake to be vegetable and fruit, one quarter starchy foods, and one quarter protein. Try to eat on a regular schedule  3 meals per day, snacking between meals should be limited to vegetables or fruits or small portions of nuts. 64 ounces of water per day is generally recommended, unless you have specific health conditions, like heart failure or kidney failure where you will need to limit fluid intake.  3. Commitment to sufficient and a  good quality of physical and mental rest daily, generally between 6 to 8 hours per day.  WITH PERSISTANCE AND PERSEVERANCE, THE IMPOSSIBLE , BECOMES THE NORM!   Thanks for choosing [facility], we consider it a privelige to serve you.

## 2015-03-16 ENCOUNTER — Telehealth: Payer: Self-pay

## 2015-03-16 DIAGNOSIS — E119 Type 2 diabetes mellitus without complications: Secondary | ICD-10-CM

## 2015-03-16 DIAGNOSIS — E785 Hyperlipidemia, unspecified: Secondary | ICD-10-CM

## 2015-03-16 DIAGNOSIS — Z794 Long term (current) use of insulin: Secondary | ICD-10-CM

## 2015-03-16 DIAGNOSIS — IMO0001 Reserved for inherently not codable concepts without codable children: Secondary | ICD-10-CM

## 2015-03-16 LAB — LIPID PANEL
CHOLESTEROL: 211 mg/dL — AB (ref 0–200)
HDL: 49 mg/dL (ref >=46)
LDL Cholesterol: 130 mg/dL — ABNORMAL HIGH (ref 0–99)
Total CHOL/HDL Ratio: 4.3 Ratio
Triglycerides: 161 mg/dL — ABNORMAL HIGH (ref <150)
VLDL: 32 mg/dL (ref 0–40)

## 2015-03-16 NOTE — Telephone Encounter (Signed)
-----  Message from Fayrene Helper, MD sent at 03/16/2015  8:04 AM EDT ----- pls let her know cholesterol is much higher than before, too high, needs to work on diet, will need rept fasting lipid, cmp and EGFR and HBA1C no later than 4 months, even if appt is later than this pls

## 2015-04-01 DIAGNOSIS — E1342 Other specified diabetes mellitus with diabetic polyneuropathy: Secondary | ICD-10-CM | POA: Diagnosis not present

## 2015-04-01 DIAGNOSIS — B351 Tinea unguium: Secondary | ICD-10-CM | POA: Diagnosis not present

## 2015-04-22 DIAGNOSIS — E669 Obesity, unspecified: Secondary | ICD-10-CM | POA: Insufficient documentation

## 2015-04-22 DIAGNOSIS — E663 Overweight: Secondary | ICD-10-CM | POA: Insufficient documentation

## 2015-04-22 NOTE — Assessment & Plan Note (Signed)
Deteriorated Hyperlipidemia:Low fat diet discussed and encouraged.   Lipid Panel  Lab Results  Component Value Date   CHOL 211* 03/14/2015   HDL 49 03/14/2015   LDLCALC 130* 03/14/2015   TRIG 161* 03/14/2015   CHOLHDL 4.3 03/14/2015      Updated lab needed at/ before next visit.

## 2015-04-22 NOTE — Assessment & Plan Note (Signed)
Sub optimal control No mmed change  DASH diet and commitment to daily physical activity for a minimum of 30 minutes discussed and encouraged, as a part of hypertension management. The importance of attaining a healthy weight is also discussed.  BP/Weight 03/15/2015 12/13/2014 10/12/2014 06/15/2014 02/17/2014 11/23/2013 09/22/2013  Systolic BP 140 148 138 138 130 160 158  Diastolic BP 64 76 70 70 74 82 82  Wt. (Lbs) 188.04 188 188.4 185.4 189.8 188.12 190.08  BMI 29.44 29.44 29.5 29.03 29.72 29.46 29.76

## 2015-04-22 NOTE — Assessment & Plan Note (Addendum)
Unchanged Patient re-educated about  the importance of commitment to a  minimum of 150 minutes of exercise per week.  The importance of healthy food choices with portion control discussed. Encouraged to start a food diary, count calories and to consider  joining a support group. Sample diet sheets offered. Goals set by the patient for the next several months.   Weight /BMI 03/15/2015 12/13/2014 10/12/2014  WEIGHT 188 lb 0.6 oz 188 lb 188 lb 6.4 oz  HEIGHT 5\' 7"  5\' 7"  5\' 7"   BMI 29.44 kg/m2 29.44 kg/m2 29.5 kg/m2    Current exercise per week 90 minutes.

## 2015-04-22 NOTE — Assessment & Plan Note (Signed)
Deteriorated but adequately controlled Kimberly Freeman is reminded of the importance of commitment to daily physical activity for 30 minutes or more, as able and the need to limit carbohydrate intake to 30 to 60 grams per meal to help with blood sugar control.   The need to take medication as prescribed, test blood sugar as directed, and to call between visits if there is a concern that blood sugar is uncontrolled is also discussed.   Kimberly Freeman is reminded of the importance of daily foot exam, annual eye examination, and good blood sugar, blood pressure and cholesterol control.  Diabetic Labs Latest Ref Rng 03/14/2015 10/11/2014 06/14/2014 02/16/2014 12/22/2013  HbA1c <5.7 % 7.9(H) 7.6(H) 8.0(H) 8.1(H) -  Microalbumin <2.0 mg/dL 1.9 - - 1.611.18 -  Micro/Creat Ratio 0.0 - 30.0 mg/g 12.9 - - 9.2 -  Chol 0 - 200 mg/dL 096(E211(H) 454145 - 098153 -  HDL >=46 mg/dL 49 41 - 46 -  Calc LDL 0 - 99 mg/dL 119(J130(H) 81 - 76 -  Triglycerides <150 mg/dL 478(G161(H) 956117 - 213(Y155(H) -  Creatinine 0.50 - 1.10 mg/dL 8.650.87 7.840.85 6.960.91 2.950.81 2.840.84   BP/Weight 03/15/2015 12/13/2014 10/12/2014 06/15/2014 02/17/2014 11/23/2013 09/22/2013  Systolic BP 140 148 138 138 130 160 158  Diastolic BP 64 76 70 70 74 82 82  Wt. (Lbs) 188.04 188 188.4 185.4 189.8 188.12 190.08  BMI 29.44 29.44 29.5 29.03 29.72 29.46 29.76   Foot/eye exam completion dates Latest Ref Rng 03/15/2015 05/18/2014  Eye Exam No Retinopathy - No Retinopathy  Foot exam Order - - -  Foot Form Completion - Done -

## 2015-05-30 ENCOUNTER — Other Ambulatory Visit: Payer: Self-pay | Admitting: Family Medicine

## 2015-05-30 ENCOUNTER — Other Ambulatory Visit: Payer: Self-pay

## 2015-05-30 MED ORDER — GLIPIZIDE ER 5 MG PO TB24: 5.0000 mg | ORAL_TABLET | Freq: Every day | ORAL | Status: DC

## 2015-05-30 MED ORDER — SPIRONOLACTONE 25 MG PO TABS: ORAL_TABLET | ORAL | Status: DC

## 2015-05-30 MED ORDER — GLUCOSE BLOOD VI STRP: ORAL_STRIP | Status: DC

## 2015-05-30 MED ORDER — GLUCOPHAGE XR 500 MG PO TB24: 1000.0000 mg | ORAL_TABLET | Freq: Two times a day (BID) | ORAL | Status: DC

## 2015-05-30 MED ORDER — ATORVASTATIN CALCIUM 40 MG PO TABS: 40.0000 mg | ORAL_TABLET | Freq: Every day | ORAL | Status: DC

## 2015-05-30 MED ORDER — ATENOLOL 50 MG PO TABS: 50.0000 mg | ORAL_TABLET | Freq: Two times a day (BID) | ORAL | Status: DC

## 2015-05-30 MED ORDER — IRBESARTAN 300 MG PO TABS: 300.0000 mg | ORAL_TABLET | Freq: Every day | ORAL | Status: DC

## 2015-05-30 MED ORDER — BENAZEPRIL HCL 40 MG PO TABS: 40.0000 mg | ORAL_TABLET | Freq: Every morning | ORAL | Status: DC

## 2015-05-30 MED ORDER — CLOTRIMAZOLE-BETAMETHASONE 1-0.05 % EX CREA: TOPICAL_CREAM | CUTANEOUS | Status: DC

## 2015-06-10 DIAGNOSIS — E1342 Other specified diabetes mellitus with diabetic polyneuropathy: Secondary | ICD-10-CM | POA: Diagnosis not present

## 2015-06-10 DIAGNOSIS — B351 Tinea unguium: Secondary | ICD-10-CM | POA: Diagnosis not present

## 2015-06-23 DIAGNOSIS — E785 Hyperlipidemia, unspecified: Secondary | ICD-10-CM | POA: Diagnosis not present

## 2015-06-23 DIAGNOSIS — E109 Type 1 diabetes mellitus without complications: Secondary | ICD-10-CM | POA: Diagnosis not present

## 2015-06-24 LAB — COMPLETE METABOLIC PANEL WITH GFR
ALT: 16 U/L (ref 6–29)
AST: 16 U/L (ref 10–35)
Albumin: 4 g/dL (ref 3.6–5.1)
Alkaline Phosphatase: 72 U/L (ref 33–130)
BILIRUBIN TOTAL: 0.3 mg/dL (ref 0.2–1.2)
BUN: 16 mg/dL (ref 7–25)
CO2: 28 mmol/L (ref 20–31)
CREATININE: 0.93 mg/dL (ref 0.60–0.93)
Calcium: 9.3 mg/dL (ref 8.6–10.4)
Chloride: 106 mmol/L (ref 98–110)
GFR, Est African American: 69 mL/min (ref >=60)
GFR, Est Non African American: 60 mL/min (ref >=60)
Glucose, Bld: 159 mg/dL — ABNORMAL HIGH (ref 65–99)
Potassium: 4.3 mmol/L (ref 3.5–5.3)
Sodium: 142 mmol/L (ref 135–146)
TOTAL PROTEIN: 6.7 g/dL (ref 6.1–8.1)

## 2015-06-24 LAB — HEMOGLOBIN A1C
HEMOGLOBIN A1C: 7.3 % — AB (ref <5.7)
MEAN PLASMA GLUCOSE: 163 mg/dL — AB (ref <117)

## 2015-06-24 LAB — LIPID PANEL
CHOLESTEROL: 168 mg/dL (ref 125–200)
HDL: 51 mg/dL (ref >=46)
LDL Cholesterol: 92 mg/dL (ref <130)
Total CHOL/HDL Ratio: 3.3 Ratio (ref <=5.0)
Triglycerides: 123 mg/dL (ref <150)
VLDL: 25 mg/dL (ref <30)

## 2015-07-19 ENCOUNTER — Ambulatory Visit (INDEPENDENT_AMBULATORY_CARE_PROVIDER_SITE_OTHER): Payer: Medicare Other

## 2015-07-19 DIAGNOSIS — Z23 Encounter for immunization: Secondary | ICD-10-CM

## 2015-07-21 ENCOUNTER — Other Ambulatory Visit: Payer: Self-pay | Admitting: Family Medicine

## 2015-07-21 DIAGNOSIS — Z1231 Encounter for screening mammogram for malignant neoplasm of breast: Secondary | ICD-10-CM

## 2015-08-08 ENCOUNTER — Ambulatory Visit (HOSPITAL_COMMUNITY)
Admission: RE | Admit: 2015-08-08 | Discharge: 2015-08-08 | Disposition: A | Discharge status: Home / Self Care | Payer: Medicare Other | Source: Ambulatory Visit | Attending: Family Medicine | Admitting: Family Medicine

## 2015-08-08 DIAGNOSIS — Z1231 Encounter for screening mammogram for malignant neoplasm of breast: Secondary | ICD-10-CM | POA: Diagnosis present

## 2015-08-23 DIAGNOSIS — E1342 Other specified diabetes mellitus with diabetic polyneuropathy: Secondary | ICD-10-CM | POA: Diagnosis not present

## 2015-08-23 DIAGNOSIS — B351 Tinea unguium: Secondary | ICD-10-CM | POA: Diagnosis not present

## 2015-09-06 ENCOUNTER — Encounter: Payer: Medicare Other | Admitting: Family Medicine

## 2015-09-30 ENCOUNTER — Other Ambulatory Visit: Payer: Self-pay

## 2015-09-30 DIAGNOSIS — E119 Type 2 diabetes mellitus without complications: Principal | ICD-10-CM

## 2015-09-30 DIAGNOSIS — Z794 Long term (current) use of insulin: Secondary | ICD-10-CM | POA: Diagnosis not present

## 2015-09-30 DIAGNOSIS — IMO0001 Reserved for inherently not codable concepts without codable children: Secondary | ICD-10-CM

## 2015-09-30 LAB — COMPLETE METABOLIC PANEL WITH GFR
ALT: 18 U/L (ref 6–29)
AST: 20 U/L (ref 10–35)
Albumin: 3.7 g/dL (ref 3.6–5.1)
Alkaline Phosphatase: 64 U/L (ref 33–130)
BILIRUBIN TOTAL: 0.4 mg/dL (ref 0.2–1.2)
BUN: 13 mg/dL (ref 7–25)
CO2: 26 mmol/L (ref 20–31)
Calcium: 9.4 mg/dL (ref 8.6–10.4)
Chloride: 107 mmol/L (ref 98–110)
Creat: 0.94 mg/dL — ABNORMAL HIGH (ref 0.60–0.93)
GFR, EST NON AFRICAN AMERICAN: 59 mL/min — AB (ref >=60)
GFR, Est African American: 68 mL/min (ref >=60)
GLUCOSE: 154 mg/dL — AB (ref 65–99)
Potassium: 4.2 mmol/L (ref 3.5–5.3)
Sodium: 142 mmol/L (ref 135–146)
Total Protein: 6.4 g/dL (ref 6.1–8.1)

## 2015-09-30 LAB — HEMOGLOBIN A1C
Hgb A1c MFr Bld: 7.2 % — ABNORMAL HIGH (ref <5.7)
MEAN PLASMA GLUCOSE: 160 mg/dL — AB (ref <117)

## 2015-10-03 ENCOUNTER — Ambulatory Visit (INDEPENDENT_AMBULATORY_CARE_PROVIDER_SITE_OTHER): Payer: Medicare Other | Admitting: Family Medicine

## 2015-10-03 ENCOUNTER — Encounter: Payer: Self-pay | Admitting: Family Medicine

## 2015-10-03 VITALS — BP 148/72 | HR 70 | Resp 16 | Ht 67.0 in | Wt 190.1 lb

## 2015-10-03 DIAGNOSIS — E119 Type 2 diabetes mellitus without complications: Secondary | ICD-10-CM

## 2015-10-03 DIAGNOSIS — Z0000 Encounter for general adult medical examination without abnormal findings: Secondary | ICD-10-CM

## 2015-10-03 DIAGNOSIS — E785 Hyperlipidemia, unspecified: Secondary | ICD-10-CM

## 2015-10-03 DIAGNOSIS — Z794 Long term (current) use of insulin: Secondary | ICD-10-CM

## 2015-10-03 DIAGNOSIS — IMO0001 Reserved for inherently not codable concepts without codable children: Secondary | ICD-10-CM

## 2015-10-03 DIAGNOSIS — I10 Essential (primary) hypertension: Secondary | ICD-10-CM

## 2015-10-03 MED ORDER — CLOTRIMAZOLE-BETAMETHASONE 1-0.05 % EX CREA: TOPICAL_CREAM | CUTANEOUS | Status: DC

## 2015-10-03 MED ORDER — MOMETASONE FUROATE 0.1 % EX SOLN
1.0000 [drp] | Freq: Two times a day (BID) | CUTANEOUS | Status: DC | PRN
Start: 2015-10-03 — End: 2015-12-07

## 2015-10-03 MED ORDER — SPIRONOLACTONE 25 MG PO TABS: 25.0000 mg | ORAL_TABLET | Freq: Every day | ORAL | Status: DC

## 2015-10-03 NOTE — Progress Notes (Signed)
Subjective:    Patient ID: Kimberly Freeman, female    DOB: [birth year], 74 y.o.   MRN: [id number]  HPI Preventive Screening-Counseling & Management   Patient present here today for a Medicare annual wellness visit.   Current Problems (verified)   Medications Prior to Visit Allergies (verified)   PAST HISTORY  Family History (verified)   Social History Married 33 years, no children, retired from [employer] in [place] 20 years ago. Never smoked    Risk Factors  Current exercise habits:  Uses stationary bike she uses 5 days a week for 30 minutes   Dietary issues discussed: heart healthy diet, limits fried foods and carbs    Cardiac risk factors: DM type 2   Depression Screen  (Note: if answer to either of the following is "Yes", a more complete depression screening is indicated)   Over the past two weeks, have you felt down, depressed or hopeless? No  Over the past two weeks, have you felt little interest or pleasure in doing things? No  Have you lost interest or pleasure in daily life? No  Do you often feel hopeless? No  Do you cry easily over simple problems? No   Activities of Daily Living  In your present state of health, do you have any difficulty performing the following activities?  Driving?: No Managing money?: No Feeding yourself?:No Getting from bed to chair?:No Climbing a flight of stairs?:No Preparing food and eating?:No Bathing or showering?:No Getting dressed?:No Getting to the toilet?:No Using the toilet?:No Moving around from place to place?: No  Fall Risk Assessment In the past year have you fallen or had a near fall?:No Are you currently taking any medications that make you dizzy?:No   Hearing Difficulties: No Do you often ask people to speak up or repeat themselves?:No Do you experience ringing or noises in your ears?:No Do you have difficulty understanding soft or whispered voices?:No  Cognitive Testing  Alert? Yes Normal  Appearance?Yes  Oriented to person? Yes Place? Yes  Time? Yes  Displays appropriate judgment?Yes  Can read the correct time from a watch face? yes Are you having problems remembering things?No  Advanced Directives have been discussed with the patient?Yes, full code, still has information on advanced directives      List the Names of Other Physician/Practitioners you currently use:  Dr Lita Mains (opth) Dr Nolen Mu (podiatrist) Dr Sunny Schlein (Dentist)    Indicate any recent Medical Services you may have received from other than [facility] providers in the past year (date may be approximate).   Assessment:    Annual Wellness Exam   Plan:   .  Medicare Attestation  I have personally reviewed:  The patient's medical and social history  Their use of alcohol, tobacco or illicit drugs  Their current medications and supplements  The patient's functional ability including ADLs,fall risks, home safety risks, cognitive, and hearing and visual impairment  Diet and physical activities  Evidence for depression or mood disorders  The patient's weight, height, BMI, and visual acuity have been recorded in the chart. I have made referrals, counseling, and provided education to the patient based on review of the above and I have provided the patient with a written personalized care plan for preventive services.      Review of Systems     Objective:   Physical Exam BP 148/72 mmHg  Pulse 70  Resp 16  Ht  (1.702 m)  Wt 190 lb 1.9 oz (86.238 kg)  BMI 29.77  kg/m2  SpO2 98%        Assessment & Plan:  Medicare annual wellness visit, subsequent Annual exam as documented. Counseling done  re healthy lifestyle involving commitment to 150 minutes exercise per week, heart healthy diet, and attaining healthy weight.The importance of adequate sleep also discussed. Regular seat belt use and home safety, is also discussed. Changes in health habits are decided on by the patient with goals and time  frames  set for achieving them. Immunization and cancer screening needs are specifically addressed at this visit.   Essential hypertension Uncontrolled , increase spironolactone dose DASH diet and commitment to daily physical activity for a minimum of 30 minutes discussed and encouraged, as a part of hypertension management. The importance of attaining a healthy weight is also discussed.  BP/Weight 10/03/2015 03/15/2015 12/13/2014 10/12/2014 06/15/2014 02/17/2014 11/23/2013  Systolic BP 148 140 148 138 138 130 160  Diastolic BP 72 64 76 70 70 74 82  Wt. (Lbs) 190.12 188.04 188 188.4 185.4 189.8 188.12  BMI 29.77 29.44 29.44 29.5 29.03 29.72 29.46      Nurse bP check in 6 weeks

## 2015-10-03 NOTE — Patient Instructions (Addendum)
Nurse bP check in 6 weeks, call if you need me before  Non fasting chem 7 and EGFR, TSH and cBC  In 6 weeks  F/u with rectal exam in 4. month  Fasting lipid, cmp and eGFR HBA1C in 4 months  Blood pressure is high, iNCREASE spironolactone to 25 mg oNE daily please  Also work on writing a living will  Continue excellent health habits , labs are great!  Thanks for choosing [facility], we consider it a privelige to serve you.  All the best for 2017!

## 2015-10-04 DIAGNOSIS — Z0000 Encounter for general adult medical examination without abnormal findings: Secondary | ICD-10-CM | POA: Insufficient documentation

## 2015-10-04 NOTE — Assessment & Plan Note (Signed)

## 2015-10-04 NOTE — Assessment & Plan Note (Signed)
Uncontrolled , increase spironolactone dose DASH diet and commitment to daily physical activity for a minimum of 30 minutes discussed and encouraged, as a part of hypertension management. The importance of attaining a healthy weight is also discussed.  BP/Weight 10/03/2015 03/15/2015 12/13/2014 10/12/2014 06/15/2014 02/17/2014 11/23/2013  Systolic BP 148 140 148 138 138 130 160  Diastolic BP 72 64 76 70 70 74 82  Wt. (Lbs) 190.12 188.04 188 188.4 185.4 189.8 188.12  BMI 29.77 29.44 29.44 29.5 29.03 29.72 29.46      Nurse bP check in 6 weeks

## 2015-11-09 ENCOUNTER — Telehealth: Payer: Self-pay | Admitting: Family Medicine

## 2015-11-09 MED ORDER — GLUCOPHAGE XR 500 MG PO TB24: 1000.0000 mg | ORAL_TABLET | Freq: Two times a day (BID) | ORAL | Status: DC

## 2015-11-09 NOTE — Telephone Encounter (Signed)
Med sent.

## 2015-11-09 NOTE — Telephone Encounter (Signed)
Patient is asking for her Metformin to be sent in to the pharmacy, I dont see this on her Med List, Please advise?

## 2015-11-14 ENCOUNTER — Other Ambulatory Visit: Payer: Self-pay | Admitting: Family Medicine

## 2015-11-14 ENCOUNTER — Ambulatory Visit: Payer: Medicare Other

## 2015-11-14 VITALS — BP 136/74

## 2015-11-14 DIAGNOSIS — E119 Type 2 diabetes mellitus without complications: Secondary | ICD-10-CM | POA: Diagnosis not present

## 2015-11-14 DIAGNOSIS — Z794 Long term (current) use of insulin: Secondary | ICD-10-CM | POA: Diagnosis not present

## 2015-11-14 DIAGNOSIS — I10 Essential (primary) hypertension: Secondary | ICD-10-CM | POA: Diagnosis not present

## 2015-11-14 LAB — CBC
HEMATOCRIT: 36.4 % (ref 36.0–46.0)
HEMOGLOBIN: 11.9 g/dL — AB (ref 12.0–15.0)
MCH: 29.5 pg (ref 26.0–34.0)
MCHC: 32.7 g/dL (ref 30.0–36.0)
MCV: 90.3 fL (ref 78.0–100.0)
MPV: 11 fL (ref 8.6–12.4)
Platelets: 237 10*3/uL (ref 150–400)
RBC: 4.03 MIL/uL (ref 3.87–5.11)
RDW: 13.5 % (ref 11.5–15.5)
WBC: 6 10*3/uL (ref 4.0–10.5)

## 2015-11-14 MED ORDER — SPIRONOLACTONE 25 MG PO TABS: 25.0000 mg | ORAL_TABLET | Freq: Every day | ORAL | Status: DC

## 2015-11-14 NOTE — Progress Notes (Signed)
Patient in for nurse blood pressure check.  Blood pressure within normal range.  Will continue medication as prescribed.  Will keep next scheduled office visit.  Will contact pharmacy about wrong quantity being sent to her.

## 2015-11-15 ENCOUNTER — Other Ambulatory Visit: Payer: Self-pay

## 2015-11-15 DIAGNOSIS — B351 Tinea unguium: Secondary | ICD-10-CM | POA: Diagnosis not present

## 2015-11-15 LAB — BASIC METABOLIC PANEL WITH GFR
BUN: 14 mg/dL (ref 7–25)
CALCIUM: 9.3 mg/dL (ref 8.6–10.4)
CO2: 27 mmol/L (ref 20–31)
Chloride: 105 mmol/L (ref 98–110)
Creat: 1.05 mg/dL — ABNORMAL HIGH (ref 0.60–0.93)
GFR, EST AFRICAN AMERICAN: 60 mL/min (ref >=60)
GFR, EST NON AFRICAN AMERICAN: 52 mL/min — AB (ref >=60)
GLUCOSE: 135 mg/dL — AB (ref 65–99)
POTASSIUM: 4.2 mmol/L (ref 3.5–5.3)
SODIUM: 144 mmol/L (ref 135–146)

## 2015-11-15 LAB — TSH: TSH: 5.289 u[IU]/mL — AB (ref 0.350–4.500)

## 2015-11-15 MED ORDER — METFORMIN HCL ER 500 MG PO TB24: 1000.0000 mg | ORAL_TABLET | Freq: Two times a day (BID) | ORAL | Status: DC

## 2015-11-16 LAB — LIPID PANEL
CHOL/HDL RATIO: 4.1 ratio (ref <=5.0)
CHOLESTEROL: 228 mg/dL — AB (ref 125–200)
HDL: 55 mg/dL (ref >=46)
LDL Cholesterol: 140 mg/dL — ABNORMAL HIGH (ref <130)
TRIGLYCERIDES: 167 mg/dL — AB (ref <150)
VLDL: 33 mg/dL — ABNORMAL HIGH (ref <30)

## 2015-11-16 LAB — HEPATIC FUNCTION PANEL
ALBUMIN: 4 g/dL (ref 3.6–5.1)
ALK PHOS: 78 U/L (ref 33–130)
ALT: 61 U/L — AB (ref 6–29)
AST: 26 U/L (ref 10–35)
BILIRUBIN TOTAL: 0.4 mg/dL (ref 0.2–1.2)
Bilirubin, Direct: 0.1 mg/dL (ref <=0.2)
Indirect Bilirubin: 0.3 mg/dL (ref 0.2–1.2)
TOTAL PROTEIN: 6.4 g/dL (ref 6.1–8.1)

## 2015-11-16 LAB — HEMOGLOBIN A1C
Hgb A1c MFr Bld: 7.2 % — ABNORMAL HIGH (ref <5.7)
MEAN PLASMA GLUCOSE: 160 mg/dL — AB (ref <117)

## 2015-11-16 LAB — T3, FREE: T3, Free: 2.8 pg/mL (ref 2.3–4.2)

## 2015-11-16 LAB — T4, FREE: Free T4: 0.84 ng/dL (ref 0.80–1.80)

## 2015-11-24 ENCOUNTER — Encounter: Payer: Self-pay | Admitting: Family Medicine

## 2015-11-24 ENCOUNTER — Ambulatory Visit (INDEPENDENT_AMBULATORY_CARE_PROVIDER_SITE_OTHER): Payer: Medicare Other | Admitting: Family Medicine

## 2015-11-24 VITALS — BP 140/72 | HR 78 | Resp 16 | Ht 67.0 in | Wt 188.4 lb

## 2015-11-24 DIAGNOSIS — M67431 Ganglion, right wrist: Secondary | ICD-10-CM | POA: Diagnosis not present

## 2015-11-24 DIAGNOSIS — IMO0001 Reserved for inherently not codable concepts without codable children: Secondary | ICD-10-CM

## 2015-11-24 DIAGNOSIS — I10 Essential (primary) hypertension: Secondary | ICD-10-CM | POA: Diagnosis not present

## 2015-11-24 DIAGNOSIS — E119 Type 2 diabetes mellitus without complications: Secondary | ICD-10-CM | POA: Diagnosis not present

## 2015-11-24 DIAGNOSIS — Z794 Long term (current) use of insulin: Secondary | ICD-10-CM

## 2015-11-24 DIAGNOSIS — E785 Hyperlipidemia, unspecified: Secondary | ICD-10-CM | POA: Diagnosis not present

## 2015-11-24 DIAGNOSIS — R945 Abnormal results of liver function studies: Secondary | ICD-10-CM

## 2015-11-24 DIAGNOSIS — R7989 Other specified abnormal findings of blood chemistry: Secondary | ICD-10-CM

## 2015-11-24 NOTE — Progress Notes (Signed)
Subjective:    Patient ID: Kimberly Freeman, female    DOB: [birth year], 75 y.o.   MRN: [id number]  HPI Pt brought in because of abnormal labs, deterioration in lipid control and , elevated liver enzyme  Her chronic conditions are reviewed also She has a new c/o tender nodule on right wrist, no inciting trauma Does report dietary indiscretion over the holidays Denies polyuria, polydipsia, blurred vision , or hypoglycemic episodes.   Review of Systems See HPI Denies recent fever or chills. Denies sinus pressure, nasal congestion, ear pain or sore throat. Denies chest congestion, productive cough or wheezing. Denies chest pains, palpitations and leg swelling Denies abdominal pain, nausea, vomiting,diarrhea or constipation.   Denies dysuria, frequency, hesitancy or incontinence. Denies headaches, seizures, numbness, or tingling. Denies depression, anxiety or insomnia. Denies skin break down or rash.        Objective:   Physical Exam  BP 140/72 mmHg  Pulse 78  Resp 16  Ht  (1.702 m)  Wt 188 lb 6.4 oz (85.458 kg)  BMI 29.50 kg/m2  SpO2 97% Patient alert and oriented and in no cardiopulmonary distress.  HEENT: No facial asymmetry, EOMI,   oropharynx pink and moist.  Neck supple no JVD, no mass.  Chest: Clear to auscultation bilaterally.  CVS: S1, S2 no murmurs, no S3.Regular rate.  ABD: Soft non tender.   Ext: No edema  MS: Adequate ROM spine, shoulders, hips and knees.Ganglion cyst on right wrist  Skin: Intact, no ulcerations or rash noted.  Psych: Good eye contact, normal affect. Memory intact not anxious or depressed appearing.  CNS: CN 2-12 intact, power,  normal throughout.no focal deficits noted.       Assessment & Plan:  Diabetes mellitus, insulin dependent (IDDM), controlled Ms. Craker is reminded of the importance of commitment to daily physical activity for 30 minutes or more, as able and the need to limit carbohydrate intake to 30 to 60 grams  per meal to help with blood sugar control.   The need to take medication as prescribed, test blood sugar as directed, and to call between visits if there is a concern that blood sugar is uncontrolled is also discussed.   Ms. Leise is reminded of the importance of daily foot exam, annual eye examination, and good blood sugar, blood pressure and cholesterol control. Unchanged and adequately controlled  Diabetic Labs Latest Ref Rng 11/14/2015 09/30/2015 06/23/2015 03/14/2015 10/11/2014  HbA1c <5.7 % 7.2(H) 7.2(H) 7.3(H) 7.9(H) 7.6(H)  Microalbumin <2.0 mg/dL - - - 1.9 -  Micro/Creat Ratio 0.0 - 30.0 mg/g - - - 12.9 -  Chol 125 - 200 mg/dL 914(N) - 829 562(Z) 308  HDL >=46 mg/dL 55 - 51 49 41  Calc LDL <130 mg/dL 657(Q) - 92 469(G) 81  Triglycerides <150 mg/dL 295(M) - 841 324(M) 010  Creatinine 0.60 - 0.93 mg/dL 2.72(Z) 3.66(Y) 4.03 4.74 0.85   BP/Weight 11/24/2015 11/14/2015 10/03/2015 03/15/2015 12/13/2014 10/12/2014 06/15/2014  Systolic BP 140 136 148 140 148 138 138  Diastolic BP 72 74 72 64 76 70 70  Wt. (Lbs) 188.4 - 190.12 188.04 188 188.4 185.4  BMI 29.5 - 29.77 29.44 29.44 29.5 29.03   Foot/eye exam completion dates Latest Ref Rng 03/15/2015 05/18/2014  Eye Exam No Retinopathy - No Retinopathy  Foot exam Order - - -  Foot Form Completion - Done -         Essential hypertension Systolic elevated, no med change DASH diet and commitment to daily physical  activity for a minimum of 30 minutes discussed and encouraged, as a part of hypertension management. The importance of attaining a healthy weight is also discussed.  BP/Weight 11/24/2015 11/14/2015 10/03/2015 03/15/2015 12/13/2014 10/12/2014 06/15/2014  Systolic BP 140 136 148 140 148 138 138  Diastolic BP 72 74 72 64 76 70 70  Wt. (Lbs) 188.4 - 190.12 188.04 188 188.4 185.4  BMI 29.5 - 29.77 29.44 29.44 29.5 29.03        Hyperlipidemia LDL goal <100 Deteriorated with mild elevation in LFT, needs to reduce fat in diet and stop  statin, rept hepatic in 6 weeks, will resume med if normalizes Since elevation is mild no imaging study ordered  No evidence of infectious cause  Ganglion cyst of wrist Small cyst max diameter approx 4 cm, mildly tender, pt will hold on ortho eval at this time by choice  Elevated LFTs Elevated AST, not a drinker, has had this in the past. Stop statin and rept hepatic in 6 weeks. No additional testing done at this time

## 2015-11-24 NOTE — Patient Instructions (Addendum)
F/u in 4.5 month with rectal, please cancel April appointment  Cholesterol has increased, please cut BACK on fat in diet as discussed  STOP lipitor/ atorvastatin as one liver enzyme is up  Non fasting hepatic panel 2nd week in March, we will call with result as discussed. Call in 2 days after lab if you have not heard from [place] please  Fasting lipid, cmp and EGFr, hBA1C, TSH in 4.5 monthPleasse reduce salt in diet and commit to exercise for 30 mins every day to improve blood pressure  Thanks for choosing [facility], we consider it a privelige to serve you.  All the best!

## 2015-12-05 DIAGNOSIS — R7989 Other specified abnormal findings of blood chemistry: Secondary | ICD-10-CM | POA: Insufficient documentation

## 2015-12-05 DIAGNOSIS — R945 Abnormal results of liver function studies: Secondary | ICD-10-CM

## 2015-12-05 DIAGNOSIS — M67439 Ganglion, unspecified wrist: Secondary | ICD-10-CM | POA: Insufficient documentation

## 2015-12-05 NOTE — Assessment & Plan Note (Signed)
Small cyst max diameter approx 4 cm, mildly tender, pt will hold on ortho eval at this time by choice

## 2015-12-05 NOTE — Assessment & Plan Note (Addendum)
Deteriorated with mild elevation in LFT, needs to reduce fat in diet and stop statin, rept hepatic in 6 weeks, will resume med if normalizes Since elevation is mild no imaging study ordered  No evidence of infectious cause

## 2015-12-05 NOTE — Assessment & Plan Note (Signed)
Systolic elevated, no med change DASH diet and commitment to daily physical activity for a minimum of 30 minutes discussed and encouraged, as a part of hypertension management. The importance of attaining a healthy weight is also discussed.  BP/Weight 11/24/2015 11/14/2015 10/03/2015 03/15/2015 12/13/2014 10/12/2014 06/15/2014  Systolic BP 140 136 148 140 148 138 138  Diastolic BP 72 74 72 64 76 70 70  Wt. (Lbs) 188.4 - 190.12 188.04 188 188.4 185.4  BMI 29.5 - 29.77 29.44 29.44 29.5 29.03

## 2015-12-05 NOTE — Assessment & Plan Note (Signed)
Kimberly Freeman is reminded of the importance of commitment to daily physical activity for 30 minutes or more, as able and the need to limit carbohydrate intake to 30 to 60 grams per meal to help with blood sugar control.   The need to take medication as prescribed, test blood sugar as directed, and to call between visits if there is a concern that blood sugar is uncontrolled is also discussed.   Kimberly Freeman is reminded of the importance of daily foot exam, annual eye examination, and good blood sugar, blood pressure and cholesterol control. Unchanged and adequately controlled  Diabetic Labs Latest Ref Rng 11/14/2015 09/30/2015 06/23/2015 03/14/2015 10/11/2014  HbA1c <5.7 % 7.2(H) 7.2(H) 7.3(H) 7.9(H) 7.6(H)  Microalbumin <2.0 mg/dL - - - 1.9 -  Micro/Creat Ratio 0.0 - 30.0 mg/g - - - 12.9 -  Chol 125 - 200 mg/dL 914(N) - 829 562(Z) 308  HDL >=46 mg/dL 55 - 51 49 41  Calc LDL <130 mg/dL 657(Q) - 92 469(G) 81  Triglycerides <150 mg/dL 295(M) - 841 324(M) 010  Creatinine 0.60 - 0.93 mg/dL 2.72(Z) 3.66(Y) 4.03 4.74 0.85   BP/Weight 11/24/2015 11/14/2015 10/03/2015 03/15/2015 12/13/2014 10/12/2014 06/15/2014  Systolic BP 140 136 148 140 148 138 138  Diastolic BP 72 74 72 64 76 70 70  Wt. (Lbs) 188.4 - 190.12 188.04 188 188.4 185.4  BMI 29.5 - 29.77 29.44 29.44 29.5 29.03   Foot/eye exam completion dates Latest Ref Rng 03/15/2015 05/18/2014  Eye Exam No Retinopathy - No Retinopathy  Foot exam Order - - -  Foot Form Completion - Done -

## 2015-12-05 NOTE — Assessment & Plan Note (Signed)
Elevated AST, not a drinker, has had this in the past. Stop statin and rept hepatic in 6 weeks. No additional testing done at this time

## 2015-12-07 ENCOUNTER — Other Ambulatory Visit: Payer: Self-pay | Admitting: Family Medicine

## 2015-12-10 ENCOUNTER — Other Ambulatory Visit: Payer: Self-pay | Admitting: Family Medicine

## 2015-12-13 ENCOUNTER — Telehealth: Payer: Self-pay

## 2015-12-13 ENCOUNTER — Emergency Department (HOSPITAL_COMMUNITY): Payer: Medicare Other

## 2015-12-13 ENCOUNTER — Observation Stay (HOSPITAL_COMMUNITY)
Admission: EM | Admit: 2015-12-13 | Discharge: 2015-12-15 | Disposition: A | Discharge status: Home / Self Care | Payer: Medicare Other | Attending: Internal Medicine | Admitting: Internal Medicine

## 2015-12-13 ENCOUNTER — Encounter (HOSPITAL_COMMUNITY): Payer: Self-pay | Admitting: Emergency Medicine

## 2015-12-13 DIAGNOSIS — I499 Cardiac arrhythmia, unspecified: Secondary | ICD-10-CM | POA: Diagnosis not present

## 2015-12-13 DIAGNOSIS — E119 Type 2 diabetes mellitus without complications: Secondary | ICD-10-CM | POA: Insufficient documentation

## 2015-12-13 DIAGNOSIS — Z7984 Long term (current) use of oral hypoglycemic drugs: Secondary | ICD-10-CM | POA: Insufficient documentation

## 2015-12-13 DIAGNOSIS — I10 Essential (primary) hypertension: Secondary | ICD-10-CM | POA: Insufficient documentation

## 2015-12-13 DIAGNOSIS — R002 Palpitations: Secondary | ICD-10-CM | POA: Diagnosis present

## 2015-12-13 DIAGNOSIS — I130 Hypertensive heart and chronic kidney disease with heart failure and stage 1 through stage 4 chronic kidney disease, or unspecified chronic kidney disease: Secondary | ICD-10-CM | POA: Diagnosis present

## 2015-12-13 DIAGNOSIS — R7989 Other specified abnormal findings of blood chemistry: Secondary | ICD-10-CM | POA: Diagnosis not present

## 2015-12-13 DIAGNOSIS — E1165 Type 2 diabetes mellitus with hyperglycemia: Secondary | ICD-10-CM

## 2015-12-13 DIAGNOSIS — E785 Hyperlipidemia, unspecified: Secondary | ICD-10-CM | POA: Insufficient documentation

## 2015-12-13 DIAGNOSIS — Z79899 Other long term (current) drug therapy: Secondary | ICD-10-CM | POA: Diagnosis not present

## 2015-12-13 DIAGNOSIS — R072 Precordial pain: Secondary | ICD-10-CM

## 2015-12-13 DIAGNOSIS — Z7982 Long term (current) use of aspirin: Secondary | ICD-10-CM | POA: Insufficient documentation

## 2015-12-13 DIAGNOSIS — Z794 Long term (current) use of insulin: Secondary | ICD-10-CM | POA: Diagnosis not present

## 2015-12-13 DIAGNOSIS — E782 Mixed hyperlipidemia: Secondary | ICD-10-CM | POA: Diagnosis present

## 2015-12-13 DIAGNOSIS — K219 Gastro-esophageal reflux disease without esophagitis: Secondary | ICD-10-CM | POA: Insufficient documentation

## 2015-12-13 DIAGNOSIS — E669 Obesity, unspecified: Secondary | ICD-10-CM | POA: Diagnosis not present

## 2015-12-13 DIAGNOSIS — N182 Chronic kidney disease, stage 2 (mild): Secondary | ICD-10-CM

## 2015-12-13 DIAGNOSIS — I214 Non-ST elevation (NSTEMI) myocardial infarction: Secondary | ICD-10-CM

## 2015-12-13 DIAGNOSIS — R9431 Abnormal electrocardiogram [ECG] [EKG]: Secondary | ICD-10-CM

## 2015-12-13 DIAGNOSIS — R778 Other specified abnormalities of plasma proteins: Secondary | ICD-10-CM | POA: Diagnosis present

## 2015-12-13 DIAGNOSIS — E1122 Type 2 diabetes mellitus with diabetic chronic kidney disease: Secondary | ICD-10-CM

## 2015-12-13 HISTORY — DX: Essential (primary) hypertension: I10

## 2015-12-13 LAB — CBC
HEMATOCRIT: 40.2 % (ref 36.0–46.0)
Hemoglobin: 13.1 g/dL (ref 12.0–15.0)
MCH: 29.8 pg (ref 26.0–34.0)
MCHC: 32.6 g/dL (ref 30.0–36.0)
MCV: 91.4 fL (ref 78.0–100.0)
Platelets: 261 10*3/uL (ref 150–400)
RBC: 4.4 MIL/uL (ref 3.87–5.11)
RDW: 12.8 % (ref 11.5–15.5)
WBC: 8.2 10*3/uL (ref 4.0–10.5)

## 2015-12-13 LAB — COMPREHENSIVE METABOLIC PANEL
ALBUMIN: 4.4 g/dL (ref 3.5–5.0)
ALK PHOS: 84 U/L (ref 38–126)
ALT: 17 U/L (ref 14–54)
ANION GAP: 11 (ref 5–15)
AST: 19 U/L (ref 15–41)
BILIRUBIN TOTAL: 0.5 mg/dL (ref 0.3–1.2)
BUN: 18 mg/dL (ref 6–20)
CALCIUM: 9.9 mg/dL (ref 8.9–10.3)
CO2: 23 mmol/L (ref 22–32)
Chloride: 107 mmol/L (ref 101–111)
Creatinine, Ser: 0.95 mg/dL (ref 0.44–1.00)
GFR calc Af Amer: >60 mL/min (ref >60)
GFR calc non Af Amer: 57 mL/min — ABNORMAL LOW (ref >60)
GLUCOSE: 141 mg/dL — AB (ref 65–99)
Potassium: 4.2 mmol/L (ref 3.5–5.1)
Sodium: 141 mmol/L (ref 135–145)
TOTAL PROTEIN: 7.8 g/dL (ref 6.5–8.1)

## 2015-12-13 LAB — TROPONIN I
TROPONIN I: 0.04 ng/mL — AB (ref <0.031)
Troponin I: 0.03 ng/mL (ref <0.031)
Troponin I: 0.04 ng/mL — ABNORMAL HIGH (ref <0.031)

## 2015-12-13 LAB — BRAIN NATRIURETIC PEPTIDE: B NATRIURETIC PEPTIDE 5: 51 pg/mL (ref 0.0–100.0)

## 2015-12-13 LAB — TSH: TSH: 2.41 u[IU]/mL (ref 0.350–4.500)

## 2015-12-13 LAB — GLUCOSE, CAPILLARY: GLUCOSE-CAPILLARY: 94 mg/dL (ref 65–99)

## 2015-12-13 MED ORDER — ENOXAPARIN SODIUM 40 MG/0.4ML ~~LOC~~ SOLN
40.0000 mg | SUBCUTANEOUS | Status: DC
  Administered 2015-12-13 – 2015-12-14 (×2): 40 mg via SUBCUTANEOUS
  Filled 2015-12-13 (×2): qty 0.4

## 2015-12-13 MED ORDER — IRBESARTAN 300 MG PO TABS
300.0000 mg | ORAL_TABLET | Freq: Every day | ORAL | Status: DC
  Administered 2015-12-14 – 2015-12-15 (×2): 300 mg via ORAL
  Filled 2015-12-13 (×2): qty 1

## 2015-12-13 MED ORDER — NITROGLYCERIN 0.4 MG SL SUBL: 0.4000 mg | SUBLINGUAL_TABLET | SUBLINGUAL | Status: DC | PRN

## 2015-12-13 MED ORDER — BENAZEPRIL HCL 10 MG PO TABS
40.0000 mg | ORAL_TABLET | Freq: Every morning | ORAL | Status: DC
  Administered 2015-12-14 – 2015-12-15 (×2): 40 mg via ORAL
  Filled 2015-12-13: qty 4
  Filled 2015-12-13: qty 1
  Filled 2015-12-13: qty 4

## 2015-12-13 MED ORDER — ASPIRIN 325 MG PO TABS
325.0000 mg | ORAL_TABLET | Freq: Every day | ORAL | Status: DC
  Administered 2015-12-14 – 2015-12-15 (×2): 325 mg via ORAL
  Filled 2015-12-13 (×2): qty 1

## 2015-12-13 MED ORDER — ATENOLOL 25 MG PO TABS
50.0000 mg | ORAL_TABLET | Freq: Two times a day (BID) | ORAL | Status: DC
  Administered 2015-12-13 – 2015-12-15 (×4): 50 mg via ORAL
  Filled 2015-12-13 (×4): qty 2

## 2015-12-13 MED ORDER — ADULT MULTIVITAMIN W/MINERALS CH
1.0000 | ORAL_TABLET | Freq: Every day | ORAL | Status: DC
  Administered 2015-12-14 – 2015-12-15 (×2): 1 via ORAL
  Filled 2015-12-13 (×2): qty 1

## 2015-12-13 MED ORDER — ACETAMINOPHEN 325 MG PO TABS: 650.0000 mg | ORAL_TABLET | ORAL | Status: DC | PRN

## 2015-12-13 MED ORDER — ASPIRIN 325 MG PO TABS: 325.0000 mg | ORAL_TABLET | ORAL | Status: DC

## 2015-12-13 MED ORDER — INSULIN ASPART 100 UNIT/ML ~~LOC~~ SOLN
0.0000 [IU] | Freq: Three times a day (TID) | SUBCUTANEOUS | Status: DC
  Administered 2015-12-14: 2 [IU] via SUBCUTANEOUS
  Administered 2015-12-15: 5 [IU] via SUBCUTANEOUS

## 2015-12-13 MED ORDER — ONDANSETRON HCL 4 MG/2ML IJ SOLN: 4.0000 mg | Freq: Four times a day (QID) | INTRAMUSCULAR | Status: DC | PRN

## 2015-12-13 MED ORDER — INSULIN GLARGINE 100 UNIT/ML ~~LOC~~ SOLN
40.0000 [IU] | Freq: Every day | SUBCUTANEOUS | Status: DC
Start: 2015-12-13 — End: 2015-12-15
  Administered 2015-12-13 – 2015-12-14 (×2): 40 [IU] via SUBCUTANEOUS
  Filled 2015-12-13 (×3): qty 0.4

## 2015-12-13 MED ORDER — SPIRONOLACTONE 25 MG PO TABS
25.0000 mg | ORAL_TABLET | Freq: Every day | ORAL | Status: DC
  Administered 2015-12-14 – 2015-12-15 (×2): 25 mg via ORAL
  Filled 2015-12-13 (×2): qty 1

## 2015-12-13 NOTE — ED Notes (Signed)
Pt reports LT sided throbbing CP, palpitations, and nausea that began yesterday. Pt states palpitations worsens when she moves. Denies CP at this time. States pain comes and goes. Pt AOx4

## 2015-12-13 NOTE — ED Provider Notes (Signed)
CSN: [id number]     Arrival date & time 12/13/15  1515 History   First MD Initiated Contact with Patient 12/13/15 1541     Chief Complaint  Patient presents with  . Palpitations  . Chest Pain    Patient is a 75 y.o. female presenting with palpitations and chest pain. The history is provided by the patient.  Palpitations Palpitations quality:  Fast Onset quality:  Sudden Duration:  1 day Timing:  Intermittent Progression:  Worsening Chronicity:  New Relieved by:  Nothing Exacerbated by: exertion. Associated symptoms: chest pain, vomiting and weakness   Associated symptoms: no shortness of breath and no syncope   Risk factors: diabetes mellitus   Risk factors: no heart disease   Chest Pain Associated symptoms: palpitations, vomiting and weakness   Associated symptoms: no fever, no shortness of breath and no syncope   Pt reports starting yesterday she had onset of feeling her heart was racing, and also felt throbbing chest pain.  It is improved at this time.  She reports CP and palpitations with exertion.  No syncope but felt lightheaded.  She also reports vomiting No pleuritic CP  She has had these episodes in the distant past but this is the worst episode    Past Medical History  Diagnosis Date  . GERD (gastroesophageal reflux disease)   . Obesity   . Hyperlipidemia   . Diabetes mellitus, type 2 (HCC)   . Hypertension    Past Surgical History  Procedure Laterality Date  . Abdominal hysterectomy    . Cholecystectomy  2004   Family History  Problem Relation Age of Onset  . Leukemia Mother   . Cancer Mother   . Prostate cancer Father   . Lung cancer Sister   . Cancer Sister 66    lung   . Diabetes Sister    Social History  Substance Use Topics  . Smoking status: Never Smoker   . Smokeless tobacco: Never Used  . Alcohol Use: No   OB History    No data available     Review of Systems  Constitutional: Negative for fever.  Respiratory: Negative for shortness  of breath.   Cardiovascular: Positive for chest pain and palpitations. Negative for syncope.  Gastrointestinal: Positive for vomiting.  Neurological: Positive for weakness. Negative for syncope.  All other systems reviewed and are negative.     Allergies  Poison oak extract and Sulfonamide derivatives  Home Medications   Prior to Admission medications   Medication Sig Start Date End Date Taking? Authorizing Provider  acetaminophen (TYLENOL) 500 MG tablet Take 500 mg by mouth as needed. For pain    Historical Provider, MD  aspirin 325 MG tablet Take 325 mg by mouth every morning.    Historical Provider, MD  atenolol (TENORMIN) 50 MG tablet TAKE 1 TABLET BY MOUTH TWICE DAILY 12/07/15   [name], MD  atorvastatin (LIPITOR) 40 MG tablet Take 1 tablet (40 mg total) by mouth at bedtime. 05/30/15   [name], MD  benazepril (LOTENSIN) 40 MG tablet TAKE 1 TABLET BY MOUTH EVERY MORNING 12/07/15   [name], MD  clotrimazole-betamethasone (LOTRISONE) cream Apply topically as directed. 10/03/15   [name], MD  GLIPIZIDE XL 5 MG 24 hr tablet TAKE 1 TABLET BY MOUTH DAILY 12/07/15   [name], MD  glucose blood test strip (Onetouch Ultrablue ) Use as instructed for twice daily testing 05/30/15   [name], MD  Insulin Glargine (  LANTUS SOLOSTAR) 100 UNIT/ML Solostar Pen Inject 50 Units into the skin daily at 10 pm. 03/15/15   [name], MD  irbesartan (AVAPRO) 300 MG tablet TAKE 1 TABLET BY MOUTH DAILY 12/12/15   [name], MD  Lancets [facility] ULTRASOFT) lancets USE AS DIRECTED TWICE DAILY 01/17/15   [name], MD  metFORMIN (GLUCOPHAGE XR) 500 MG 24 hr tablet Take 2 tablets (1,000 mg total) by mouth 2 (two) times daily. 11/15/15   [name], MD  mometasone (ELOCON) 0.1 % lotion APPLY 1 DROP TOPICALLY TWICE DAILY AS NEEDED 12/07/15   [name], MD  spironolactone (ALDACTONE) 25 MG tablet Take 1 tablet (25 mg  total) by mouth daily. 11/14/15   [name], MD   Pulse 110  Temp(Src) 97.9 F (36.6 C) (Oral)  Resp 18  Ht 5\' 7"  (1.702 m)  Wt 81.647 kg  BMI 28.19 kg/m2  SpO2 99% Physical Exam CONSTITUTIONAL: Well developed/well nourished HEAD: Normocephalic/atraumatic EYES: EOMI/PERRL ENMT: Mucous membranes moist NECK: supple no meningeal signs SPINE/BACK:entire spine nontender CV: tachycardic, systolic murmur noted, irregular LUNGS: Lungs are clear to auscultation bilaterally, no apparent distress ABDOMEN: soft, nontender, no rebound or guarding, bowel sounds noted throughout abdomen NEURO: Pt is awake/alert/appropriate, moves all extremitiesx4.  No facial droop.   EXTREMITIES: pulses normal/equal, full ROM SKIN: warm, color normal PSYCH: no abnormalities of mood noted, alert and oriented to situation  ED Course  Procedures  4:27 PM Pt with palpitations and CP, now improved She has taken ASA today However she has multiple cardiac risk factors EKG abnormal, and appears changed from prior 7:58 PM Pt did have episode while walking at approximately 1839 with sinus tach and also had associated PVC Repeat troponin has elevated Currently CP free at this time Will admit for monitoring, continued troponin monitoring and also would benefit from echocardiogram (pt found to have murmur on exam) D/w dr opyd for admission Labs Review Labs Reviewed  COMPREHENSIVE METABOLIC PANEL - Abnormal; Notable for the following:    Glucose, Bld 141 (*)    GFR calc non Af Amer 57 (*)    All other components within normal limits  TROPONIN I - Abnormal; Notable for the following:    Troponin I 0.04 (*)    All other components within normal limits  CBC  TROPONIN I    Imaging Review Dg Chest 2 View  12/13/2015  CLINICAL DATA:  Irregular heart rate for 1 day EXAM: CHEST  2 VIEW COMPARISON:  08/10/2004 FINDINGS: Heart size upper normal and stable. Vascular pattern normal. Lungs clear. No consolidation  or effusion. IMPRESSION: No active cardiopulmonary disease. Electronically Signed   By: Esperanza Heir M.D.   On: 12/13/2015 17:09   I have personally reviewed and evaluated these images and lab results as part of my medical decision-making.   EKG Interpretation   Date/Time:  Tuesday December 13 2015 15:29:07 EST Ventricular Rate:  108 PR Interval:  171 QRS Duration: 102 QT Interval:  341 QTC Calculation: 457 R Axis:   -21 Text Interpretation:  Sinus tachycardia Borderline left axis deviation  Repol abnrm suggests ischemia, lateral leads No previous ECGs available  Confirmed by Bebe Shaggy  MD, Dorinda Hill ([id number]) on 12/13/2015 3:41:36 PM      MDM   Final diagnoses:  Palpitations  Non-STEMI (non-ST elevated myocardial infarction) [facility])    Nursing notes including past medical history and social history reviewed and considered in documentation xrays/imaging reviewed by myself and considered during evaluation  Labs/vital reviewed myself and considered during evaluation      Zadie Rhine, MD 12/13/15 2000

## 2015-12-13 NOTE — H&P (Signed)
[facility] History and Physical  Kimberly Freeman ZOX:[id number] DOB: May 04, 1941 DOA: 12/13/2015  Referring physician: ED physician PCP: Syliva Overman, MD  Specialists:  Dr. Leeanne Deed (cardiology)   Chief Complaint:  Palpitations, chest discomfort   HPI: Kimberly Freeman is a 75 y.o. female with PMH of hypertension, insulin-dependent diabetes mellitus, hyperlipidemia, and GERD who presents to the ED with intermittent palpitations and chest discomfort of approximately 24 hours duration. Patient gives a history of approximately 25 years occasional palpitations but had otherwise been in her usual state of health until yesterday evening, when upon minimal exertion, she developed palpitations that were more intense than usual. This lasted approximately 5 minutes, resolved spontaneously, and was associated with some nausea, but no vomiting, diaphoresis, or dyspnea. Palpitations have recurred, again with exertion, several more times. Patient took a sublingual nitroglycerin 1 last night with no appreciable effect on her symptoms. Patient denies any recent illness, fevers, chills, dyspnea, cough, dysuria, or diarrhea. She is followed by cardiologist, Dr. Purvis Sheffield in the outpatient settings and per recent notes there have been plans for an exercise stress test. Patient states that stress test has not yet been performed. She takes a daily aspirin 325, atenolol, ARB, and spironolactone. She had been on a statin until this was recently held by her PCP for elevated transaminases. Glycemic control is maintained with Lantus, glipizide, and metformin. She called her cardiologist's office today for advice and was directed to the ED for further evaluation.  In ED, patient was found to be afebrile, saturating well on room air, with sinus tachycardia (rate 106), and vitals otherwise stable. EKG demonstrates a sinus tachycardia with inverted T wave in lead aVL, and T-wave flattening in the lateral leads. Chest x-ray  is negative for acute cardiopulmonary disease. CMP is notable only for a mildly elevated serum glucose. CBC is unremarkable and initial troponin is reassuring at 0.03. Patient was monitored in the emergency department with no arrhythmias noted on telemetry. She had recurrence of palpitations when she got up to use the restroom in the ED, but these spontaneously resolved within 5 minutes and sinus tachycardia was noted on the monitor during the event. His second troponin was drawn and returns with borderline elevation at a value of 0.04. Patient remained stable in the emergency department and will be admitted to the telemetry unit for ongoing evaluation and management of palpitations with EKG abnormality and borderline elevation in cardiac biomarkers.   Where does patient live?   At home     Can patient participate in ADLs?  Yes         Review of Systems:   General: no fevers, chills, sweats, weight change, poor appetite, or fatigue HEENT: no blurry vision, hearing changes or sore throat Pulm: no dyspnea, cough, or wheeze CV:  Palpitations, chest discomfort, no orthopnea or edema  Abd: no vomiting, abdominal pain, diarrhea, or constipation. Nausea GU: no dysuria, hematuria, increased urinary frequency, or urgency  Ext: no leg edema Neuro: no focal weakness, numbness, or tingling, no vision change or hearing loss Skin: no rash, no wounds MSK: No muscle spasm, no deformity, no red, hot, or swollen joint Heme: No easy bruising or bleeding Travel history: No recent long distant travel    Allergy:  Allergies  Allergen Reactions  . Poison Oak Extract [employer] Poison Oak] Hives  . Sulfonamide Derivatives Itching    Burning sensation all over body    Past Medical History  Diagnosis Date  . GERD (gastroesophageal reflux disease)   .  Obesity   . Hyperlipidemia   . Diabetes mellitus, type 2 (HCC)   . Hypertension     Past Surgical History  Procedure Laterality Date  . Abdominal  hysterectomy    . Cholecystectomy  2004    Social History:  reports that she has never smoked. She has never used smokeless tobacco. She reports that she does not drink alcohol or use illicit drugs.  Family History:  Family History  Problem Relation Age of Onset  . Leukemia Mother   . Cancer Mother   . Prostate cancer Father   . Lung cancer Sister   . Cancer Sister 30    lung   . Diabetes Sister      Prior to Admission medications   Medication Sig Start Date End Date Taking? Authorizing Provider  acetaminophen (TYLENOL) 500 MG tablet Take 500 mg by mouth as needed. For pain   Yes Historical Provider, MD  aspirin 325 MG tablet Take 325 mg by mouth every morning.   Yes Historical Provider, MD  atenolol (TENORMIN) 50 MG tablet TAKE 1 TABLET BY MOUTH TWICE DAILY 12/07/15  Yes [name], MD  atorvastatin (LIPITOR) 40 MG tablet Take 1 tablet (40 mg total) by mouth at bedtime. 05/30/15  Yes [name], MD  benazepril (LOTENSIN) 40 MG tablet TAKE 1 TABLET BY MOUTH EVERY MORNING 12/07/15  Yes [name], MD  GLIPIZIDE XL 5 MG 24 hr tablet TAKE 1 TABLET BY MOUTH DAILY 12/07/15  Yes [name], MD  Insulin Glargine (LANTUS SOLOSTAR) 100 UNIT/ML Solostar Pen Inject 50 Units into the skin daily at 10 pm. 03/15/15  Yes [name], MD  irbesartan (AVAPRO) 300 MG tablet TAKE 1 TABLET BY MOUTH DAILY 12/12/15  Yes [name], MD  metFORMIN (GLUCOPHAGE XR) 500 MG 24 hr tablet Take 2 tablets (1,000 mg total) by mouth 2 (two) times daily. 11/15/15  Yes [name], MD  Multiple Vitamin (MULTIVITAMIN WITH MINERALS) TABS tablet Take 1 tablet by mouth daily.   Yes Historical Provider, MD  spironolactone (ALDACTONE) 25 MG tablet Take 1 tablet (25 mg total) by mouth daily. 11/14/15  Yes [name], MD  clotrimazole-betamethasone (LOTRISONE) cream Apply topically as directed. Patient not taking: Reported on 12/13/2015 10/03/15   [name], MD   glucose blood test strip (Onetouch Ultrablue ) Use as instructed for twice daily testing 05/30/15   [name], MD  Lancets [facility] ULTRASOFT) lancets USE AS DIRECTED TWICE DAILY 01/17/15   [name], MD  mometasone (ELOCON) 0.1 % lotion APPLY 1 DROP TOPICALLY TWICE DAILY AS NEEDED Patient not taking: Reported on 12/13/2015 12/07/15   [name], MD    Physical Exam: Filed Vitals:   12/13/15 1630 12/13/15 1800 12/13/15 1900 12/13/15 2000  BP: 147/74 148/74 144/71 177/75  Pulse: 93 88 94 97  Temp:      TempSrc:      Resp: 15 13 19 19   Height:      Weight:      SpO2: 98% 99% 97% 98%   General: Not in acute distress HEENT:       Eyes: PERRL, EOMI, no scleral icterus or conjunctival pallor.       ENT: No discharge from the ears or nose, no pharyngeal ulcers, petechiae or exudate, no tonsillar enlargement.        Neck: No JVD, no bruit, no appreciable mass Heme: No cervical adenopathy, no pallor Cardiac: S1/S2, RRR, soft  systolic murmur at LSB, No gallops or rubs. Pulm: Good air movement bilaterally. No rales, wheezing, rhonchi or rubs. Abd: Soft, nondistended, nontender, no rebound pain or gaurding, no mass or organomegaly, BS present. Ext:  Trace LE edema bilaterally. 2+DP/PT pulse bilaterally. Musculoskeletal: No gross deformity, no red, hot, swollen joints   Skin: No rashes or wounds on exposed surfaces  Neuro: Alert, oriented X3, cranial nerves II-XII grossly intact. No focal findings Psych: Patient is not overtly psychotic, appropriate mood and affect.  Labs on Admission:  Basic Metabolic Panel:  Recent Labs Lab 12/13/15 1543  NA 141  K 4.2  CL 107  CO2 23  GLUCOSE 141*  BUN 18  CREATININE 0.95  CALCIUM 9.9   Liver Function Tests:  Recent Labs Lab 12/13/15 1543  AST 19  ALT 17  ALKPHOS 84  BILITOT 0.5  PROT 7.8  ALBUMIN 4.4   No results for input(s): LIPASE, AMYLASE in the last 168 hours. No results for input(s): AMMONIA in the  last 168 hours. CBC:  Recent Labs Lab 12/13/15 1543  WBC 8.2  HGB 13.1  HCT 40.2  MCV 91.4  PLT 261   Cardiac Enzymes:  Recent Labs Lab 12/13/15 1543 12/13/15 1832  TROPONINI 0.03 0.04*    BNP (last 3 results) No results for input(s): BNP in the last 8760 hours.  ProBNP (last 3 results) No results for input(s): PROBNP in the last 8760 hours.  CBG: No results for input(s): GLUCAP in the last 168 hours.  Radiological Exams on Admission: Dg Chest 2 View  12/13/2015  CLINICAL DATA:  Irregular heart rate for 1 day EXAM: CHEST  2 VIEW COMPARISON:  08/10/2004 FINDINGS: Heart size upper normal and stable. Vascular pattern normal. Lungs clear. No consolidation or effusion. IMPRESSION: No active cardiopulmonary disease. Electronically Signed   By: Esperanza Heir M.D.   On: 12/13/2015 17:09    EKG: Independently reviewed.  Abnormal findings: Sinus tachycardia (rate 108), Twi aVL, T-wave flattening lateral leads, lateral lead Tw flattening worse when compared to prior    Assessment/Plan  1. Palpitations, chest discomfort, ?NSTEMI   - Pt reports ~25-yr history of intermittent palpitations, but this episode more intense than typical  - There was recurrence of sxs in ED with sinus tachycardia noted on telemetry  - EKG features Twi in aVL and Tw flattening in lateral leads; these are chronic findings, but lateral lead change is more pronounced today  - Initial troponin 0.03, second is 0.04; suspect this is related to increased rate rather than acute coronary occlusion - Pt took full-dose ASA at home PTA  - Continue daily ASA, beta-blocker, ARB; statin had recently been held by PCP for elevated transaminases (now normalized) - Monitor on telemetry overnight; follow serial troponin measurements; repeat EKG for recurrence in sxs  - Plan for TTE in am given murmur on exam; will also look for wall-motion abnormalities - Check fasting lipids, A1c, optimize control of RFs  2.  Hypertension, essential  - At goal currently  - Continue home regimen of atenolol 50 mg BID, irbesartan 300 mg qD, Aldactone 25 mg qD, ASA 325 qD  - Check TTE as above  - Monitor, adjust regimen prn   3. Insulin-dependent type II DM  - A1c 7.2% on 11/14/15, reflecting sub-optimal glycemic control  - Takes Lantus 50 units qHS, glipizide 5 mg qD, and metformin 500 mg BID at home  - Hold oral agents while inpt  - Reduce Lantus to 40 units qHS to start,  and institute a moderate-intensity SSI correctional  - Check CBG with meals and qHS  - Carb-modified diet when appropriate   4. Hyperlipidemia  - Total chol 228, LDL 140, HDL 55 on 11/14/15 - Statin recently held by PCP d/t new elevation in transaminases  - Transaminases have now normalized - Would consider trial of statin at decreased dose vs alternative lipid-lowering agents      DVT ppx:  SQ Lovenox    Code Status: Full code Family Communication:  Yes, patient's cousin at bed side Disposition Plan: Admit to inpatient   Date of Service 12/13/2015    Briscoe Deutscher, MD [facility] Pager [phone]  If 7PM-7AM, please contact night-coverage [email or web address] Password [email or web address] 12/13/2015, 8:22 PM

## 2015-12-13 NOTE — Telephone Encounter (Signed)
Pt called to say she has had intermittent CP since last night,lasts approximately 5 minutes each time. Had only used NTG spray x 1,c/o active CP now,advised to use NTG again.Also reports,nausea,irregular HR she describes as "going fast and slow", and lightheadedness . I advised her to call 911 but she is calling a neighbor to bring her to the ED   Will message Dr Purvis Sheffield

## 2015-12-14 ENCOUNTER — Encounter (HOSPITAL_COMMUNITY): Payer: Self-pay | Admitting: Cardiology

## 2015-12-14 ENCOUNTER — Observation Stay (HOSPITAL_COMMUNITY): Payer: Medicare Other

## 2015-12-14 DIAGNOSIS — I214 Non-ST elevation (NSTEMI) myocardial infarction: Secondary | ICD-10-CM | POA: Diagnosis not present

## 2015-12-14 DIAGNOSIS — R7989 Other specified abnormal findings of blood chemistry: Secondary | ICD-10-CM | POA: Diagnosis not present

## 2015-12-14 DIAGNOSIS — R072 Precordial pain: Secondary | ICD-10-CM | POA: Diagnosis not present

## 2015-12-14 DIAGNOSIS — E119 Type 2 diabetes mellitus without complications: Secondary | ICD-10-CM

## 2015-12-14 DIAGNOSIS — Z794 Long term (current) use of insulin: Secondary | ICD-10-CM

## 2015-12-14 DIAGNOSIS — I10 Essential (primary) hypertension: Secondary | ICD-10-CM

## 2015-12-14 DIAGNOSIS — E785 Hyperlipidemia, unspecified: Secondary | ICD-10-CM

## 2015-12-14 DIAGNOSIS — K219 Gastro-esophageal reflux disease without esophagitis: Secondary | ICD-10-CM | POA: Diagnosis not present

## 2015-12-14 DIAGNOSIS — R002 Palpitations: Principal | ICD-10-CM

## 2015-12-14 DIAGNOSIS — E669 Obesity, unspecified: Secondary | ICD-10-CM | POA: Diagnosis not present

## 2015-12-14 LAB — COMPREHENSIVE METABOLIC PANEL
ALT: 14 U/L (ref 14–54)
AST: 20 U/L (ref 15–41)
Albumin: 3.8 g/dL (ref 3.5–5.0)
Alkaline Phosphatase: 76 U/L (ref 38–126)
Anion gap: 8 (ref 5–15)
BILIRUBIN TOTAL: 0.7 mg/dL (ref 0.3–1.2)
BUN: 15 mg/dL (ref 6–20)
CHLORIDE: 107 mmol/L (ref 101–111)
CO2: 25 mmol/L (ref 22–32)
CREATININE: 0.85 mg/dL (ref 0.44–1.00)
Calcium: 9.6 mg/dL (ref 8.9–10.3)
Glucose, Bld: 104 mg/dL — ABNORMAL HIGH (ref 65–99)
Potassium: 4.2 mmol/L (ref 3.5–5.1)
Sodium: 140 mmol/L (ref 135–145)
TOTAL PROTEIN: 7 g/dL (ref 6.5–8.1)

## 2015-12-14 LAB — LIPID PANEL
Cholesterol: 264 mg/dL — ABNORMAL HIGH (ref 0–200)
HDL: 52 mg/dL (ref >40)
LDL CALC: 181 mg/dL — AB (ref 0–99)
TRIGLYCERIDES: 157 mg/dL — AB (ref <150)
Total CHOL/HDL Ratio: 5.1 RATIO
VLDL: 31 mg/dL (ref 0–40)

## 2015-12-14 LAB — GLUCOSE, CAPILLARY
GLUCOSE-CAPILLARY: 89 mg/dL (ref 65–99)
GLUCOSE-CAPILLARY: 131 mg/dL — AB (ref 65–99)
Glucose-Capillary: 111 mg/dL — ABNORMAL HIGH (ref 65–99)
Glucose-Capillary: 132 mg/dL — ABNORMAL HIGH (ref 65–99)

## 2015-12-14 LAB — TROPONIN I: Troponin I: 0.03 ng/mL (ref <0.031)

## 2015-12-14 LAB — T4, FREE: FREE T4: 0.78 ng/dL (ref 0.61–1.12)

## 2015-12-14 MED ORDER — SODIUM CHLORIDE 0.9% FLUSH
3.0000 mL | Freq: Two times a day (BID) | INTRAVENOUS | Status: DC
  Administered 2015-12-14 – 2015-12-15 (×3): 3 mL via INTRAVENOUS

## 2015-12-14 MED ORDER — SODIUM CHLORIDE 0.9 % IV SOLN: 250.0000 mL | INTRAVENOUS | Status: DC | PRN

## 2015-12-14 MED ORDER — ATORVASTATIN CALCIUM 40 MG PO TABS
40.0000 mg | ORAL_TABLET | Freq: Every day | ORAL | Status: DC
  Administered 2015-12-14: 40 mg via ORAL
  Filled 2015-12-14: qty 1

## 2015-12-14 MED ORDER — PANTOPRAZOLE SODIUM 40 MG PO TBEC
40.0000 mg | DELAYED_RELEASE_TABLET | Freq: Every day | ORAL | Status: DC
  Administered 2015-12-14 – 2015-12-15 (×2): 40 mg via ORAL
  Filled 2015-12-14 (×2): qty 1

## 2015-12-14 MED ORDER — SODIUM CHLORIDE 0.9% FLUSH: 3.0000 mL | INTRAVENOUS | Status: DC | PRN

## 2015-12-14 MED ORDER — REGADENOSON 0.4 MG/5ML IV SOLN
0.4000 mg | Freq: Once | INTRAVENOUS | Status: DC
  Filled 2015-12-14: qty 5

## 2015-12-14 NOTE — Consult Note (Signed)
Reason for Consult: Chest pain and palpitations Referring Physician: Dr. Mitzi Hansen, [facility] Primary care: Dr. Moshe Cipro Primary Cardiologist:Dr. Bronson Ing Consulting Cardiologist: Dr. Tally Due Kimberly Freeman is an 75 y.o. female referred to [facility] 11/2014 for abnormal EKG that showed T-wave inversion in 1, aVL and very mild T-wave inversion in V6. She has a history of hypertension, IDDM, mixed dyslipidemia. At that time she denied any chest pain or cardiac symptoms. He recommended a stress test if she developed any symptoms but in the meantime aggressive risk factor modification. She also has 25 year history of palpitations that had not been bothersome.  The patient came to the emergency room yesterday with intermittent palpitations and chest pain. Monday night when she got up out of her recliner she had chest tightness which she's never experienced before. There was no radiation, associated dyspnea, diaphoresis, palpitations dizziness or presyncope. It eased within a few minutes. She went to bed but when she got up the next day she said her heart was just pounding. She says it goes fast but does not skip. She also had some nausea. The pounding became unbearable so she proceeded to the emergency room. No chest pain since then. She rides her exercise bike for 30 minutes daily without any symptoms. She does not smoke and has no family history of CAD.  Initial EKG showed sinus tachycardia at 108 bpm with slight ST depression inferior laterally. Follow-up EKG today shows normal sinus rhythm with T wave inversion similar to 2015.  Troponins 0.03, 0.04, 0.04, 0.03. BNP normal, chest x-ray normal.  Past Medical History  Diagnosis Date  . GERD (gastroesophageal reflux disease)   . Obesity   . Hyperlipidemia   . Diabetes mellitus, type 2 ([place])   . Essential hypertension     Past Surgical History  Procedure Laterality Date  . Abdominal hysterectomy    . Cholecystectomy  2004     Family History  Problem Relation Age of Onset  . Leukemia Mother   . Cancer Mother   . Prostate cancer Father   . Lung cancer Sister 90  . Diabetes Sister     Social History:  reports that she has never smoked. She has never used smokeless tobacco. She reports that she does not drink alcohol or use illicit drugs.  Allergies:  Allergies  Allergen Reactions  . Poison Oak Extract [employer]] Hives  . Sulfonamide Derivatives Itching    Burning sensation all over body    Medications:  Scheduled Meds: . aspirin  325 mg Oral Daily  . atenolol  50 mg Oral BID  . atorvastatin  40 mg Oral q1800  . benazepril  40 mg Oral q morning - 10a  . enoxaparin (LOVENOX) injection  40 mg Subcutaneous Q24H  . insulin aspart  0-15 Units Subcutaneous TID WC  . insulin glargine  40 Units Subcutaneous QHS  . irbesartan  300 mg Oral Daily  . multivitamin with minerals  1 tablet Oral Daily  . sodium chloride flush  3 mL Intravenous Q12H  . spironolactone  25 mg Oral Daily   Continuous Infusions:  PRN Meds:.sodium chloride, acetaminophen, nitroGLYCERIN, ondansetron (ZOFRAN) IV, sodium chloride flush   Results for orders placed or performed during the hospital encounter of 12/13/15 (from the past 48 hour(s))  Brain natriuretic peptide     Status: None   Collection Time: 12/13/15  3:34 PM  Result Value Ref Range   B Natriuretic Peptide 51.0 0.0 - 100.0  pg/mL  CBC     Status: None   Collection Time: 12/13/15  3:43 PM  Result Value Ref Range   WBC 8.2 4.0 - 10.5 K/uL   RBC 4.40 3.87 - 5.11 MIL/uL   Hemoglobin 13.1 12.0 - 15.0 g/dL   HCT 40.2 36.0 - 46.0 %   MCV 91.4 78.0 - 100.0 fL   MCH 29.8 26.0 - 34.0 pg   MCHC 32.6 30.0 - 36.0 g/dL   RDW 12.8 11.5 - 15.5 %   Platelets 261 150 - 400 K/uL  Troponin I     Status: None   Collection Time: 12/13/15  3:43 PM  Result Value Ref Range   Troponin I 0.03 <0.031 ng/mL    Comment:        NO INDICATION OF MYOCARDIAL INJURY.    Comprehensive metabolic panel     Status: Abnormal   Collection Time: 12/13/15  3:43 PM  Result Value Ref Range   Sodium 141 135 - 145 mmol/L   Potassium 4.2 3.5 - 5.1 mmol/L   Chloride 107 101 - 111 mmol/L   CO2 23 22 - 32 mmol/L   Glucose, Bld 141 (H) 65 - 99 mg/dL   BUN 18 6 - 20 mg/dL   Creatinine, Ser 0.95 0.44 - 1.00 mg/dL   Calcium 9.9 8.9 - 10.3 mg/dL   Total Protein 7.8 6.5 - 8.1 g/dL   Albumin 4.4 3.5 - 5.0 g/dL   AST 19 15 - 41 U/L   ALT 17 14 - 54 U/L   Alkaline Phosphatase 84 38 - 126 U/L   Total Bilirubin 0.5 0.3 - 1.2 mg/dL   GFR calc non Af Amer 57 (L) >60 mL/min   GFR calc Af Amer >60 >60 mL/min    Comment: (NOTE) The eGFR has been calculated using the CKD EPI equation. This calculation has not been validated in all clinical situations. eGFR's persistently <60 mL/min signify possible Chronic Kidney Disease.    Anion gap 11 5 - 15  TSH     Status: None   Collection Time: 12/13/15  3:43 PM  Result Value Ref Range   TSH 2.410 0.350 - 4.500 uIU/mL  Troponin I     Status: Abnormal   Collection Time: 12/13/15  6:32 PM  Result Value Ref Range   Troponin I 0.04 (H) <0.031 ng/mL    Comment:        PERSISTENTLY INCREASED TROPONIN VALUES IN THE RANGE OF 0.04-0.49 ng/mL CAN BE SEEN IN:       -UNSTABLE ANGINA       -CONGESTIVE HEART FAILURE       -MYOCARDITIS       -CHEST TRAUMA       -ARRYHTHMIAS       -LATE PRESENTING MYOCARDIAL INFARCTION       -COPD   CLINICAL FOLLOW-UP RECOMMENDED.   T4, free     Status: None   Collection Time: 12/13/15  8:45 PM  Result Value Ref Range   Free T4 0.78 0.61 - 1.12 ng/dL    Comment: Performed at [facility]  Troponin I     Status: Abnormal   Collection Time: 12/13/15  8:45 PM  Result Value Ref Range   Troponin I 0.04 (H) <0.031 ng/mL    Comment:        PERSISTENTLY INCREASED TROPONIN VALUES IN THE RANGE OF 0.04-0.49 ng/mL CAN BE SEEN IN:       -UNSTABLE ANGINA       -  CONGESTIVE HEART FAILURE        -MYOCARDITIS       -CHEST TRAUMA       -ARRYHTHMIAS       -LATE PRESENTING MYOCARDIAL INFARCTION       -COPD   CLINICAL FOLLOW-UP RECOMMENDED.   Glucose, capillary     Status: None   Collection Time: 12/13/15  9:15 PM  Result Value Ref Range   Glucose-Capillary 94 65 - 99 mg/dL  Comprehensive metabolic panel     Status: Abnormal   Collection Time: 12/14/15  2:18 AM  Result Value Ref Range   Sodium 140 135 - 145 mmol/L   Potassium 4.2 3.5 - 5.1 mmol/L   Chloride 107 101 - 111 mmol/L   CO2 25 22 - 32 mmol/L   Glucose, Bld 104 (H) 65 - 99 mg/dL   BUN 15 6 - 20 mg/dL   Creatinine, Ser 0.85 0.44 - 1.00 mg/dL   Calcium 9.6 8.9 - 10.3 mg/dL   Total Protein 7.0 6.5 - 8.1 g/dL   Albumin 3.8 3.5 - 5.0 g/dL   AST 20 15 - 41 U/L   ALT 14 14 - 54 U/L   Alkaline Phosphatase 76 38 - 126 U/L   Total Bilirubin 0.7 0.3 - 1.2 mg/dL   GFR calc non Af Amer >60 >60 mL/min   GFR calc Af Amer >60 >60 mL/min    Comment: (NOTE) The eGFR has been calculated using the CKD EPI equation. This calculation has not been validated in all clinical situations. eGFR's persistently <60 mL/min signify possible Chronic Kidney Disease.    Anion gap 8 5 - 15  Troponin I     Status: None   Collection Time: 12/14/15  2:18 AM  Result Value Ref Range   Troponin I 0.03 <0.031 ng/mL    Comment:        NO INDICATION OF MYOCARDIAL INJURY.   Lipid panel     Status: Abnormal   Collection Time: 12/14/15  2:18 AM  Result Value Ref Range   Cholesterol 264 (H) 0 - 200 mg/dL   Triglycerides 157 (H) <150 mg/dL   HDL 52 >40 mg/dL   Total CHOL/HDL Ratio 5.1 RATIO   VLDL 31 0 - 40 mg/dL   LDL Cholesterol 181 (H) 0 - 99 mg/dL    Comment:        Total Cholesterol/HDL:CHD Risk Coronary Heart Disease Risk Table                     Men   Women  1/2 Average Risk   3.4   3.3  Average Risk       5.0   4.4  2 X Average Risk   9.6   7.1  3 X Average Risk  23.4   11.0        Use the calculated Patient Ratio above and the  CHD Risk Table to determine the patient's CHD Risk.        ATP III CLASSIFICATION (LDL):  <100     mg/dL   Optimal  100-129  mg/dL   Near or Above                    Optimal  130-159  mg/dL   Borderline  160-189  mg/dL   High  >190     mg/dL   Very High   Glucose, capillary     Status: None   Collection Time: 12/14/15  7:23 AM  Result Value Ref Range   Glucose-Capillary 89 65 - 99 mg/dL  Troponin I     Status: None   Collection Time: 12/14/15  9:03 AM  Result Value Ref Range   Troponin I <0.03 <0.031 ng/mL    Comment:        NO INDICATION OF MYOCARDIAL INJURY.   Glucose, capillary     Status: Abnormal   Collection Time: 12/14/15 11:09 AM  Result Value Ref Range   Glucose-Capillary 111 (H) 65 - 99 mg/dL    Dg Chest 2 View  12/13/2015  CLINICAL DATA:  Irregular heart rate for 1 day EXAM: CHEST  2 VIEW COMPARISON:  08/10/2004 FINDINGS: Heart size upper normal and stable. Vascular pattern normal. Lungs clear. No consolidation or effusion. IMPRESSION: No active cardiopulmonary disease. Electronically Signed   By: Skipper Cliche M.D.   On: 12/13/2015 17:09    ROS  See HPI Eyes: Negative Ears:Negative for hearing loss, tinnitus Cardiovascular: Negative for irregular heartbeat, dyspnea, dyspnea on exertion, near-syncope, orthopnea, paroxysmal nocturnal dyspnea and syncope,edema, claudication, cyanosis,.  Respiratory:   Negative for cough, hemoptysis, shortness of breath, sleep disturbances due to breathing, sputum production and wheezing.   Endocrine: Negative for cold intolerance and heat intolerance.  Hematologic/Lymphatic: Negative for adenopathy and bleeding problem. Does not bruise/bleed easily.  Musculoskeletal: Negative.   Gastrointestinal: Negative for nausea, vomiting, reflux, abdominal pain, diarrhea, constipation.   Genitourinary: Negative for bladder incontinence, dysuria, flank pain, frequency, hematuria, hesitancy, nocturia and urgency.  Neurological: Negative.   Allergic/Immunologic: Negative for environmental allergies.  Blood pressure 90/54, pulse 77, temperature 98.4 F (36.9 C), temperature source Oral, resp. rate 18, height 5' 7" (1.702 m), weight 186 lb 8.2 oz (84.6 kg), SpO2 100 %. Physical Exam PHYSICAL EXAM: Well-nournished, in no acute distress. Neck: No JVD, HJR, Bruit, or thyroid enlargement Lungs: No tachypnea, clear without wheezing, rales, or rhonchi Cardiovascular: RRR, PMI not displaced, positive S4, 2/6 systolic murmur at the apex, no bruit, thrill, or heave. Abdomen: BS normal. Soft without organomegaly, masses, lesions or tenderness. Extremities: without cyanosis, clubbing or edema. Good distal pulses bilateral SKin: Warm, no lesions or rashes  Musculoskeletal: No deformities Neuro: no focal signs  Assessment/Plan: Minimal increase in troponin I to 0.04: Chest pain and palpitations with EKG changes and borderline troponins and patient with multiple cardiac risk factors. Await 2-D echo and then determine course of ischemic workup.  Baseline abnormal EKG  Insulin-dependent diabetes mellitus  Hypertension blood pressure up and down on Lotensin, atenolol and Avapro as well as Aldactone  Mixed dyslipidemia currently not treated. Used to take Lipitor 40 mg daily. Will resume.  Ermalinda Barrios PA-C  12/14/2015, 4:33 PM    Attending note:  Patient seen and examined. Reviewed records and agree with above evaluation by Ms. Bonnell Public PA-C. Kimberly Freeman presents to the hospital complaining mainly of recent feelings of rapid palpitations, also interval chest discomfort described as a tightness. Symptoms began Monday evening when she got up from her recliner while watching TV, suddenly felt as if her heart rate took off. This lasted for several minutes. The next morning when she got up she felt a vague tightness in her chest, not necessarily associated with palpitations. She has had intermittent recurring feelings of chest pounding and  palpitations since that time, not necessarily associated with chest discomfort however. She has never had syncope.   Under observation she has had recurrent palpitations. Telemetry shows sinus rhythm and sinus tachycardia to as fast as 140  bpm, no definitive SVT or atrial fibrillation. Also occasional to frequent PVCs with some couplets and bigeminy. His emanation she is comfortable seated in bed side chair. Lungs are clear without labored breathing. Cardiac exam reveals RRR without gallop. Examination of pitting edema. He has had several troponin I levels no higher than 0.04, and recent reading 0.03. LDL is elevated at 181. ECG shows sinus rhythm with ST segment depression initially in the inferolateral leads in the setting of sinus tachycardia, ultimately nonspecific ST-T abnormalities by follow-up tracing. Is to x-ray shows no acute findings.  Patient presents with palpitations and chest tightness. Troponin I levels are equivocal and not overly suggestive of ACS. She has had sinus rhythm and sinus tachycardia with occasional to frequent PVCs but no sustained arrhythmias. Cardiac risk factors include hypertension, hyperlipidemia, and type 2 diabetes mellitus. ECG is abnormal at baseline but she also had some dynamic ST segment changes at presentation. We will follow-up on the echocardiogram that has already been ordered. If LVEF is normal range, we will schedule an inpatient Cardiolite for tomorrow for objective ischemic evaluation. Otherwise, a cardiac catheterization could be considered.  Satira Sark, M.D., F.A.C.C.

## 2015-12-14 NOTE — Care Management Obs Status (Signed)
MEDICARE OBSERVATION STATUS NOTIFICATION   Patient Details  Name: Kimberly Freeman MRN: [id number] Date of Birth: 1941/08/14   Medicare Observation Status Notification Given:  Yes    Adonis Huguenin, RN 12/14/2015, 11:32 AM

## 2015-12-14 NOTE — Care Management Note (Signed)
Case Management Note  Patient Details  Name: MILLIANI HERRADA MRN: [id number] Date of Birth: [birth year]  Subjective/Objective:        Spoke with patient who is alert oriented from home, independent. No CM needs Identified.            Action/Plan:  Home with self care. Expected Discharge Date:  12/15/15               Expected Discharge Plan:  Home/Self Care  In-House Referral:     Discharge planning Services  CM Consult  Post Acute Care Choice:    Choice offered to:     DME Arranged:    DME Agency:     HH Arranged:    HH Agency:     Status of Service:  Completed, signed off  Medicare Important Message Given:    Date Medicare IM Given:    Medicare IM give by:    Date Additional Medicare IM Given:    Additional Medicare Important Message give by:     If discussed at Long Length of Stay Meetings, dates discussed:    Additional Comments:  Adonis Huguenin, RN 12/14/2015, 11:59 AM

## 2015-12-14 NOTE — Progress Notes (Signed)
[facility] PROGRESS NOTE  Kimberly Freeman:[id number] DOB: [birth year] DOA: 12/13/2015 PCP: Syliva Overman, MD  Assessment/Plan: 1-chest pain and palpitations: Heart score 4-5; mild elevation in her troponins and some changes in her EKG that could be nonspecific by a follow-up tracing. -Troponins flat elevation to 0.04 (max); most likely associated with episode of supraventricular tachycardia -2-D echo pending -Will continue beta blocker, aspirin, statins and benazepril -Cardiology is on before and planning to perform Myoview on 12/15/15 -Will follow results and further recommendations.  2-essential hypertension: Stable and well controlled currently -Will continue current antihypertensive regimen  3-diabetes mellitus on insulin therapy: -A1C pending -Will continue sliding scale insulin and Lantus  4-hyperlipidemia: LDL 140 and total cholesterol 228 -Continue Lipitor 40 mg by mouth daily  5-GERD: will continue PPI  Code Status: Full code Family Communication: No family at bedside Disposition Plan: Patient remains in the hospital, will follow 2-D echo reports and most likely inpatient Cardiolite as per cardiology recommendations.   Consultants:  Cardiology  Procedures:  2-D echo: Pending  Antibiotics:  None  HPI/Subjective: Afebrile, feeling much better and currently without chest pain, shortness of breath or any further sensation of her heart racing.  Objective: Filed Vitals:   12/14/15 1158 12/14/15 1500  BP: 135/68 90/54  Pulse: 82 77  Temp:  98.4 F (36.9 C)  Resp:  18    Intake/Output Summary (Last 24 hours) at 12/14/15 1755 Last data filed at 12/14/15 1158  Gross per 24 hour  Intake      3 ml  Output    700 ml  Net   -697 ml   Filed Weights   12/13/15 1530 12/13/15 2058  Weight: 81.647 kg (180 lb) 84.6 kg (186 lb 8.2 oz)    Exam:   General:  Afebrile, denies nausea, vomiting and is currently chest pain-free. Patient also denies  shortness of breath or orthopnea.  Cardiovascular: Rate controlled at this moment, no rubs, no gallops; positive systolic ejection murmur  Respiratory: No crackles, good air movement bilaterally, no wheezes  Abdomen: Soft, nontender, positive bowel sounds  Musculoskeletal: No edema, no cyanosis, full range of motion.  Data Reviewed: Basic Metabolic Panel:  Recent Labs Lab 12/13/15 1543 12/14/15 0218  NA 141 140  K 4.2 4.2  CL 107 107  CO2 23 25  GLUCOSE 141* 104*  BUN 18 15  CREATININE 0.95 0.85  CALCIUM 9.9 9.6   Liver Function Tests:  Recent Labs Lab 12/13/15 1543 12/14/15 0218  AST 19 20  ALT 17 14  ALKPHOS 84 76  BILITOT 0.5 0.7  PROT 7.8 7.0  ALBUMIN 4.4 3.8   CBC:  Recent Labs Lab 12/13/15 1543  WBC 8.2  HGB 13.1  HCT 40.2  MCV 91.4  PLT 261   Cardiac Enzymes:  Recent Labs Lab 12/13/15 1543 12/13/15 1832 12/13/15 2045 12/14/15 0218 12/14/15 0903  TROPONINI 0.03 0.04* 0.04* 0.03 <0.03   BNP (last 3 results)  Recent Labs  12/13/15 1534  BNP 51.0   CBG:  Recent Labs Lab 12/13/15 2115 12/14/15 0723 12/14/15 1109 12/14/15 1637  GLUCAP 94 89 111* 132*   Studies: Dg Chest 2 View  12/13/2015  CLINICAL DATA:  Irregular heart rate for 1 day EXAM: CHEST  2 VIEW COMPARISON:  08/10/2004 FINDINGS: Heart size upper normal and stable. Vascular pattern normal. Lungs clear. No consolidation or effusion. IMPRESSION: No active cardiopulmonary disease. Electronically Signed   By: Esperanza Heir M.D.   On: 12/13/2015 17:09  Scheduled Meds: . aspirin  325 mg Oral Daily  . atenolol  50 mg Oral BID  . atorvastatin  40 mg Oral q1800  . benazepril  40 mg Oral q morning - 10a  . enoxaparin (LOVENOX) injection  40 mg Subcutaneous Q24H  . insulin aspart  0-15 Units Subcutaneous TID WC  . insulin glargine  40 Units Subcutaneous QHS  . irbesartan  300 mg Oral Daily  . multivitamin with minerals  1 tablet Oral Daily  . regadenoson  0.4 mg  Intravenous Once  . sodium chloride flush  3 mL Intravenous Q12H  . spironolactone  25 mg Oral Daily   Continuous Infusions:   Principal Problem:   Palpitations Active Problems:   Diabetes mellitus, insulin dependent (IDDM), controlled (HCC)   Hyperlipidemia LDL goal <100   Essential hypertension   Nonspecific abnormal electrocardiogram (ECG) (EKG)   Elevated troponin I level    Time spent: 35 minutes    Vassie Loll  [facility] Pager [phone]. If 7PM-7AM, please contact night-coverage at [email or web address], password [email or web address] 12/14/2015, 5:55 PM  LOS: 1 day

## 2015-12-14 NOTE — Progress Notes (Signed)
**Note De-identified Kimberly Freeman Obfuscation** Abnormal EKG reported to RN 

## 2015-12-15 ENCOUNTER — Observation Stay (HOSPITAL_COMMUNITY): Payer: Medicare Other

## 2015-12-15 ENCOUNTER — Encounter (HOSPITAL_COMMUNITY): Payer: Self-pay

## 2015-12-15 ENCOUNTER — Observation Stay (HOSPITAL_BASED_OUTPATIENT_CLINIC_OR_DEPARTMENT_OTHER): Payer: Medicare Other

## 2015-12-15 DIAGNOSIS — K219 Gastro-esophageal reflux disease without esophagitis: Secondary | ICD-10-CM | POA: Diagnosis not present

## 2015-12-15 DIAGNOSIS — R7989 Other specified abnormal findings of blood chemistry: Secondary | ICD-10-CM | POA: Diagnosis not present

## 2015-12-15 DIAGNOSIS — I214 Non-ST elevation (NSTEMI) myocardial infarction: Secondary | ICD-10-CM | POA: Diagnosis not present

## 2015-12-15 DIAGNOSIS — R002 Palpitations: Secondary | ICD-10-CM | POA: Diagnosis not present

## 2015-12-15 DIAGNOSIS — R072 Precordial pain: Secondary | ICD-10-CM

## 2015-12-15 DIAGNOSIS — E119 Type 2 diabetes mellitus without complications: Secondary | ICD-10-CM | POA: Diagnosis not present

## 2015-12-15 DIAGNOSIS — I10 Essential (primary) hypertension: Secondary | ICD-10-CM | POA: Diagnosis not present

## 2015-12-15 DIAGNOSIS — Z136 Encounter for screening for cardiovascular disorders: Secondary | ICD-10-CM

## 2015-12-15 DIAGNOSIS — E669 Obesity, unspecified: Secondary | ICD-10-CM | POA: Diagnosis not present

## 2015-12-15 DIAGNOSIS — R079 Chest pain, unspecified: Secondary | ICD-10-CM

## 2015-12-15 LAB — CBC
HCT: 37.8 % (ref 36.0–46.0)
Hemoglobin: 12.1 g/dL (ref 12.0–15.0)
MCH: 29.6 pg (ref 26.0–34.0)
MCHC: 32 g/dL (ref 30.0–36.0)
MCV: 92.4 fL (ref 78.0–100.0)
PLATELETS: 239 10*3/uL (ref 150–400)
RBC: 4.09 MIL/uL (ref 3.87–5.11)
RDW: 13 % (ref 11.5–15.5)
WBC: 6 10*3/uL (ref 4.0–10.5)

## 2015-12-15 LAB — NM MYOCAR MULTI W/SPECT W/WALL MOTION / EF
CHL CUP NUCLEAR SRS: 3
CHL CUP NUCLEAR SSS: 3
LHR: 0.09
LV sys vol: 18 mL
LVDIAVOL: 45 mL
NUC STRESS TID: 1.04
Peak HR: 86 {beats}/min
Rest HR: 62 {beats}/min
SDS: 2

## 2015-12-15 LAB — BASIC METABOLIC PANEL
ANION GAP: 6 (ref 5–15)
BUN: 24 mg/dL — ABNORMAL HIGH (ref 6–20)
CALCIUM: 9.4 mg/dL (ref 8.9–10.3)
CO2: 28 mmol/L (ref 22–32)
Chloride: 107 mmol/L (ref 101–111)
Creatinine, Ser: 1.14 mg/dL — ABNORMAL HIGH (ref 0.44–1.00)
GFR, EST AFRICAN AMERICAN: 53 mL/min — AB (ref >60)
GFR, EST NON AFRICAN AMERICAN: 46 mL/min — AB (ref >60)
GLUCOSE: 117 mg/dL — AB (ref 65–99)
POTASSIUM: 4.3 mmol/L (ref 3.5–5.1)
SODIUM: 141 mmol/L (ref 135–145)

## 2015-12-15 LAB — GLUCOSE, CAPILLARY: Glucose-Capillary: 213 mg/dL — ABNORMAL HIGH (ref 65–99)

## 2015-12-15 LAB — HEMOGLOBIN A1C
HEMOGLOBIN A1C: 7.4 % — AB (ref 4.8–5.6)
MEAN PLASMA GLUCOSE: 166 mg/dL

## 2015-12-15 MED ORDER — SODIUM CHLORIDE 0.9% FLUSH
INTRAVENOUS | Status: AC
  Administered 2015-12-15: 10 mL via INTRAVENOUS
  Filled 2015-12-15: qty 10

## 2015-12-15 MED ORDER — TECHNETIUM TC 99M SESTAMIBI - CARDIOLITE
10.0000 | Freq: Once | INTRAVENOUS | Status: AC | PRN
  Administered 2015-12-15: 07:00:00 10.5 via INTRAVENOUS

## 2015-12-15 MED ORDER — ATENOLOL 50 MG PO TABS: 75.0000 mg | ORAL_TABLET | Freq: Two times a day (BID) | ORAL | Status: DC

## 2015-12-15 MED ORDER — TECHNETIUM TC 99M SESTAMIBI GENERIC - CARDIOLITE
30.0000 | Freq: Once | INTRAVENOUS | Status: AC | PRN
  Administered 2015-12-15: 28 via INTRAVENOUS

## 2015-12-15 MED ORDER — REGADENOSON 0.4 MG/5ML IV SOLN
INTRAVENOUS | Status: AC
  Administered 2015-12-15: 0.4 mg via INTRAVENOUS
  Filled 2015-12-15: qty 5

## 2015-12-15 MED ORDER — PANTOPRAZOLE SODIUM 40 MG PO TBEC: 40.0000 mg | DELAYED_RELEASE_TABLET | Freq: Every day | ORAL | Status: DC

## 2015-12-15 NOTE — Progress Notes (Signed)
**Note De-Identified Katrinia Straker Obfuscation** Abnormal EKG results reported to Dr. Gwenlyn Perking.

## 2015-12-15 NOTE — Progress Notes (Signed)
Pt discharged with IV removed and site intact. Patient discharged with all belongings and discharge papers to home.

## 2015-12-15 NOTE — Discharge Summary (Signed)
Physician Discharge Summary  Kimberly Freeman WGN:[id number] DOB: 1941/08/16 DOA: 12/13/2015  PCP: Syliva Overman, MD  Admit date: 12/13/2015 Discharge date: 12/15/2015  Time spent: 35 minutes  Recommendations for Outpatient Follow-up:  Repeat BMET to follow electrolytes and renal function Reassess BP and adjust antihypertensive regimen as needed   Discharge Diagnoses:  Chest pain Palpitations Diabetes mellitus, insulin dependent (IDDM), controlled (HCC) Hyperlipidemia LDL goal <100 Essential hypertension GERD Elevated troponin I level   Discharge Condition: stable and improved. Discharge home with instruction to follow up with PCP in 10 days.  Diet recommendation: heart healthy and modified carbohydrates  Filed Weights   12/13/15 1530 12/13/15 2058 12/15/15 0551  Weight: 81.647 kg (180 lb) 84.6 kg (186 lb 8.2 oz) 84.3 kg (185 lb 13.6 oz)    History of present illness:  75 y.o. female with PMH of hypertension, insulin-dependent diabetes mellitus, hyperlipidemia, and GERD who presents to the ED with intermittent palpitations and chest discomfort of approximately 24 hours duration. Patient gives a history of approximately 25 years occasional palpitations but had otherwise been in her usual state of health until yesterday evening, when upon minimal exertion, she developed palpitations that were more intense than usual. This lasted approximately 5 minutes, resolved spontaneously, and was associated with some nausea, but no vomiting, diaphoresis, or dyspnea. Palpitations have recurred, again with exertion, several more times. Patient took a sublingual nitroglycerin 1 last night with no appreciable effect on her symptoms. Patient denies any recent illness, fevers, chills, dyspnea, cough, dysuria, or diarrhea. She is followed by cardiologist, Dr. Purvis Sheffield in the outpatient settings and per recent notes there have been plans for an exercise stress test. Patient states that stress test has not  yet been performed. She takes a daily aspirin 325, atenolol, ARB, and spironolactone. She had been on a statin until this was recently held by her PCP for elevated transaminases. Glycemic control is maintained with Lantus, glipizide, and metformin. She called her cardiologist's office for advice and was directed to the ED for further evaluation. Patient admitted for ACS r/o.  Hospital Course:  1-Chest pain and palpitations: Heart score 4-5; mild elevation in her troponins and some changes in her EKG that could be nonspecific by a follow-up tracing. -Troponins flat elevation to 0.04 (max); most likely associated with episode of supraventricular tachycardia -no specific changes appreciated on telmetry -Will continue beta blocker, aspirin, statins and ARB -atenolol adjusted to 75mg  BID -Cardiology consulted and recommended Myoview; which was completed on 12/15/15 and demonstarted low risk probability with preserved EF (60%)  2-essential hypertension: Stable and well controlled currently -Will continue current antihypertensive regimen; except for discontinuation of lotensin, given high risk for hyperkalemia and AKI while on spironolactone and ARB as well.  3-diabetes mellitus on insulin therapy: -A1C 7.4 -Will continue home hypoglycemic regimen -follow up with PCP as an outpatient for further adjustments as needed   4-hyperlipidemia: LDL 140 and total cholesterol 228 -Continue Lipitor 40 mg by mouth daily  5-GERD: will continue PPI  Procedures:  Myoview: 12/15/2015   There was no ST segment deviation noted during stress.  The study is normal.  This is a low risk study.  Nuclear stress EF: 60%.   Consultations:  Cardiology   Discharge Exam: Filed Vitals:   12/15/15 0550 12/15/15 1113  BP: 105/55 117/76  Pulse: 101 73  Temp: 98.5 F (36.9 C)   Resp: 18     General: Afebrile, denies nausea, vomiting, palpitations and is currently chest pain-free. Patient also denies shortness  of  breath and orthopnea.  Cardiovascular: Rate controlled at this moment, no rubs, no gallops; soft positive systolic ejection murmur  Respiratory: No crackles, good air movement bilaterally, no wheezes  Abdomen: Soft, nontender, positive bowel sounds  Musculoskeletal: No edema, no cyanosis, full range of motion.   Discharge Instructions   Discharge Instructions    Diet - low sodium heart healthy    Complete by:  As directed      Discharge instructions    Complete by:  As directed   Arrange follow up with PCP in 10 days Take medications as prescribed Follow low sodium and modified carb diet Always watch your weight on daily basis          Current Discharge Medication List    START taking these medications   Details  pantoprazole (PROTONIX) 40 MG tablet Take 1 tablet (40 mg total) by mouth daily. Qty: 30 tablet, Refills: 1      CONTINUE these medications which have CHANGED   Details  atenolol (TENORMIN) 50 MG tablet Take 1.5 tablets (75 mg total) by mouth 2 (two) times daily. Qty: 180 tablet, Refills: 0      CONTINUE these medications which have NOT CHANGED   Details  acetaminophen (TYLENOL) 500 MG tablet Take 500 mg by mouth as needed. For pain    aspirin 325 MG tablet Take 325 mg by mouth every morning.    atorvastatin (LIPITOR) 40 MG tablet Take 1 tablet (40 mg total) by mouth at bedtime. Qty: 90 tablet, Refills: 1    GLIPIZIDE XL 5 MG 24 hr tablet TAKE 1 TABLET BY MOUTH DAILY Qty: 90 tablet, Refills: 1    Insulin Glargine (LANTUS SOLOSTAR) 100 UNIT/ML Solostar Pen Inject 50 Units into the skin daily at 10 pm. Qty: 15 pen, Refills: PRN   Associated Diagnoses: Diabetes mellitus, insulin dependent (IDDM), controlled (HCC)    irbesartan (AVAPRO) 300 MG tablet TAKE 1 TABLET BY MOUTH DAILY Qty: 90 tablet, Refills: 0    metFORMIN (GLUCOPHAGE XR) 500 MG 24 hr tablet Take 2 tablets (1,000 mg total) by mouth 2 (two) times daily. Qty: 90 tablet, Refills: 1     Multiple Vitamin (MULTIVITAMIN WITH MINERALS) TABS tablet Take 1 tablet by mouth daily.    spironolactone (ALDACTONE) 25 MG tablet Take 1 tablet (25 mg total) by mouth daily. Qty: 90 tablet, Refills: 0    clotrimazole-betamethasone (LOTRISONE) cream Apply topically as directed. Qty: 45 g, Refills: 1    glucose blood test strip (Onetouch Ultrablue ) Use as instructed for twice daily testing Qty: 300 each, Refills: 1    Lancets (ONETOUCH ULTRASOFT) lancets USE AS DIRECTED TWICE DAILY Qty: 200 each, Refills: 2      STOP taking these medications     benazepril (LOTENSIN) 40 MG tablet      mometasone (ELOCON) 0.1 % lotion        Allergies  Allergen Reactions  . Poison Oak Extract [employer] Poison Oak] Hives  . Sulfonamide Derivatives Itching    Burning sensation all over body   Follow-up Information    Follow up with Syliva Overman, MD. Schedule an appointment as soon as possible for a visit in 10 days.   Specialty:  Family Medicine   Contact information:   [address] [place] [address] [phone]       The results of significant diagnostics from this hospitalization (including imaging, microbiology, ancillary and laboratory) are listed below for reference.    Significant  Diagnostic Studies: Dg Chest 2 View  12/13/2015  CLINICAL DATA:  Irregular heart rate for 1 day EXAM: CHEST  2 VIEW COMPARISON:  08/10/2004 FINDINGS: Heart size upper normal and stable. Vascular pattern normal. Lungs clear. No consolidation or effusion. IMPRESSION: No active cardiopulmonary disease. Electronically Signed   By: Esperanza Heir M.D.   On: 12/13/2015 17:09   Nm Myocar Multi W/spect W/wall Motion / Ef  12/15/2015   There was no ST segment deviation noted during stress.  The study is normal.  This is a low risk study.  Nuclear stress EF: 60%.    Labs: Basic Metabolic Panel:  Recent Labs Lab 12/13/15 1543 12/14/15 0218 12/15/15 0624  NA 141 140 141  K 4.2  4.2 4.3  CL 107 107 107  CO2 GLUCOSE 141* 104* 117*  BUN 18 15 24*  CREATININE 0.95 0.85 1.14*  CALCIUM 9.9 9.6 9.4   Liver Function Tests:  Recent Labs Lab 12/13/15 1543 12/14/15 0218  AST 19 20  ALT 17 14  ALKPHOS 84 76  BILITOT 0.5 0.7  PROT 7.8 7.0  ALBUMIN 4.4 3.8   CBC:  Recent Labs Lab 12/13/15 1543 12/15/15 0624  WBC 8.2 6.0  HGB 13.1 12.1  HCT 40.2 37.8  MCV 91.4 92.4  PLT 261 239   Cardiac Enzymes:  Recent Labs Lab 12/13/15 1543 12/13/15 1832 12/13/15 2045 12/14/15 0218 12/14/15 0903  TROPONINI 0.03 0.04* 0.04* 0.03 <0.03   BNP (last 3 results)  Recent Labs  12/13/15 1534  BNP 51.0   CBG:  Recent Labs Lab 12/14/15 0723 12/14/15 1109 12/14/15 1637 12/14/15 2024 12/15/15 1139  GLUCAP 89 111* 132* 131* 213*    Signed:  Vassie Loll MD.  [facility] 12/15/2015, 1:09 PM

## 2015-12-15 NOTE — Progress Notes (Signed)
SUBJECTIVE: No further episodes of chest pain or shortness of breath nor palpitations.     Intake/Output Summary (Last 24 hours) at 12/15/15 1043 Last data filed at 12/15/15 0551  Gross per 24 hour  Intake    243 ml  Output    900 ml  Net   -657 ml    Current Facility-Administered Medications  Medication Dose Route Frequency Provider Last Rate Last Dose  . 0.9 %  sodium chloride infusion  250 mL Intravenous PRN Vassie Loll, MD      . acetaminophen (TYLENOL) tablet 650 mg  650 mg Oral Q4H PRN Briscoe Deutscher, MD      . aspirin tablet 325 mg  325 mg Oral Daily Briscoe Deutscher, MD   325 mg at 12/14/15 1158  . atenolol (TENORMIN) tablet 50 mg  50 mg Oral BID Briscoe Deutscher, MD   50 mg at 12/14/15 2123  . atorvastatin (LIPITOR) tablet 40 mg  40 mg Oral q1800 Dyann Kief, PA-C   40 mg at 12/14/15 1803  . benazepril (LOTENSIN) tablet 40 mg  40 mg Oral q morning - 10a Briscoe Deutscher, MD   40 mg at 12/14/15 1202  . enoxaparin (LOVENOX) injection 40 mg  40 mg Subcutaneous Q24H Briscoe Deutscher, MD   40 mg at 12/14/15 2124  . insulin aspart (novoLOG) injection 0-15 Units  0-15 Units Subcutaneous TID WC Briscoe Deutscher, MD   2 Units at 12/14/15 1804  . insulin glargine (LANTUS) injection 40 Units  40 Units Subcutaneous QHS Briscoe Deutscher, MD   40 Units at 12/14/15 2124  . irbesartan (AVAPRO) tablet 300 mg  300 mg Oral Daily Briscoe Deutscher, MD   300 mg at 12/14/15 1202  . multivitamin with minerals tablet 1 tablet  1 tablet Oral Daily Briscoe Deutscher, MD   1 tablet at 12/14/15 1158  . nitroGLYCERIN (NITROSTAT) SL tablet 0.4 mg  0.4 mg Sublingual Q5 Min x 3 PRN Lavone Neri Opyd, MD      . ondansetron (ZOFRAN) injection 4 mg  4 mg Intravenous Q6H PRN Briscoe Deutscher, MD      . pantoprazole (PROTONIX) EC tablet 40 mg  40 mg Oral Daily Vassie Loll, MD   40 mg at 12/14/15 1847  . regadenoson (LEXISCAN) injection SOLN 0.4 mg  0.4 mg Intravenous Once Jonelle Sidle, MD      . sodium chloride  flush (NS) 0.9 % injection 3 mL  3 mL Intravenous Q12H Vassie Loll, MD   3 mL at 12/14/15 2124  . sodium chloride flush (NS) 0.9 % injection 3 mL  3 mL Intravenous PRN Vassie Loll, MD      . spironolactone (ALDACTONE) tablet 25 mg  25 mg Oral Daily Briscoe Deutscher, MD   25 mg at 12/14/15 1202    Filed Vitals:   12/14/15 1500 12/14/15 2100 12/15/15 0550 12/15/15 0551  BP: 90/54 138/78 105/55   Pulse: 77 89 101   Temp: 98.4 F (36.9 C) 98.6 F (37 C) 98.5 F (36.9 C)   TempSrc: Oral Oral Oral   Resp: Height:      Weight:    185 lb 13.6 oz (84.3 kg)  SpO2: 100% 100% 94%     PHYSICAL EXAM General: NAD HEENT: Normal. Neck: No JVD, no thyromegaly.  Lungs: Clear to auscultation bilaterally with normal respiratory effort. CV: Nondisplaced PMI.  Regular  rate and rhythm, normal S1/S2, no S3/S4, no murmur.  No pretibial edema.  No carotid bruit.  Normal pedal pulses.  Abdomen: Soft, nontender, no distention.  Neurologic: Alert and oriented x 3.  Psych: Normal affect. Musculoskeletal: Normal range of motion. No gross deformities. Extremities: No clubbing or cyanosis.   TELEMETRY: Reviewed telemetry pt in sinus rhythm.  LABS: Basic Metabolic Panel:  Recent Labs  09/81/19 0218 12/15/15 0624  NA 140 141  K 4.2 4.3  CL 107 107  CO2 25 28  GLUCOSE 104* 117*  BUN 15 24*  CREATININE 0.85 1.14*  CALCIUM 9.6 9.4   Liver Function Tests:  Recent Labs  12/13/15 1543 12/14/15 0218  AST 19 20  ALT 17 14  ALKPHOS 84 76  BILITOT 0.5 0.7  PROT 7.8 7.0  ALBUMIN 4.4 3.8   No results for input(s): LIPASE, AMYLASE in the last 72 hours. CBC:  Recent Labs  12/13/15 1543 12/15/15 0624  WBC 8.2 6.0  HGB 13.1 12.1  HCT 40.2 37.8  MCV 91.4 92.4  PLT 261 239   Cardiac Enzymes:  Recent Labs  12/13/15 2045 12/14/15 0218 12/14/15 0903  TROPONINI 0.04* 0.03 <0.03   BNP: Invalid input(s): POCBNP D-Dimer: No results for input(s): DDIMER in the last 72  hours. Hemoglobin A1C:  Recent Labs  12/13/15 1534  HGBA1C 7.4*   Fasting Lipid Panel:  Recent Labs  12/14/15 0218  CHOL 264*  HDL 52  LDLCALC 181*  TRIG 157*  CHOLHDL 5.1   Thyroid Function Tests:  Recent Labs  12/13/15 1543  TSH 2.410   Anemia Panel: No results for input(s): VITAMINB12, FOLATE, FERRITIN, TIBC, IRON, RETICCTPCT in the last 72 hours.  RADIOLOGY: Dg Chest 2 View  12/13/2015  CLINICAL DATA:  Irregular heart rate for 1 day EXAM: CHEST  2 VIEW COMPARISON:  08/10/2004 FINDINGS: Heart size upper normal and stable. Vascular pattern normal. Lungs clear. No consolidation or effusion. IMPRESSION: No active cardiopulmonary disease. Electronically Signed   By: Esperanza Heir M.D.   On: 12/13/2015 17:09   Nm Myocar Multi W/spect W/wall Motion / Ef  12/15/2015   There was no ST segment deviation noted during stress.  The study is normal.  This is a low risk study.  Nuclear stress EF: 60%.       ASSESSMENT AND PLAN: 1. Chest pain: No recurrences. I personally interpreted the nuclear stress test which was normal and shared the results with the patient.   2. Essential HTN: Controlled. No changes.  3. Palpitations: No arrhythmias. Continue atenolol.  4. Hyperlipidemia: Continue Lipitor.  Dispo: Can be discharged from my standpoint.   Prentice Docker, M.D., F.A.C.C.

## 2015-12-22 ENCOUNTER — Encounter: Payer: Self-pay | Admitting: Family Medicine

## 2015-12-22 ENCOUNTER — Ambulatory Visit (INDEPENDENT_AMBULATORY_CARE_PROVIDER_SITE_OTHER): Payer: Medicare Other | Admitting: Family Medicine

## 2015-12-22 VITALS — BP 140/80 | HR 64 | Resp 18 | Ht 67.0 in | Wt 190.1 lb

## 2015-12-22 DIAGNOSIS — M25511 Pain in right shoulder: Secondary | ICD-10-CM

## 2015-12-22 DIAGNOSIS — Z794 Long term (current) use of insulin: Secondary | ICD-10-CM

## 2015-12-22 DIAGNOSIS — R002 Palpitations: Secondary | ICD-10-CM

## 2015-12-22 DIAGNOSIS — E785 Hyperlipidemia, unspecified: Secondary | ICD-10-CM

## 2015-12-22 DIAGNOSIS — K219 Gastro-esophageal reflux disease without esophagitis: Secondary | ICD-10-CM

## 2015-12-22 DIAGNOSIS — I10 Essential (primary) hypertension: Secondary | ICD-10-CM

## 2015-12-22 DIAGNOSIS — Z09 Encounter for follow-up examination after completed treatment for conditions other than malignant neoplasm: Secondary | ICD-10-CM

## 2015-12-22 DIAGNOSIS — R778 Other specified abnormalities of plasma proteins: Secondary | ICD-10-CM

## 2015-12-22 DIAGNOSIS — M25519 Pain in unspecified shoulder: Secondary | ICD-10-CM | POA: Insufficient documentation

## 2015-12-22 DIAGNOSIS — E119 Type 2 diabetes mellitus without complications: Secondary | ICD-10-CM

## 2015-12-22 DIAGNOSIS — R7989 Other specified abnormal findings of blood chemistry: Secondary | ICD-10-CM

## 2015-12-22 DIAGNOSIS — IMO0001 Reserved for inherently not codable concepts without codable children: Secondary | ICD-10-CM

## 2015-12-22 MED ORDER — NITROGLYCERIN 0.4 MG SL SUBL: 0.4000 mg | SUBLINGUAL_TABLET | SUBLINGUAL | Status: DC | PRN

## 2015-12-22 MED ORDER — PREDNISONE 5 MG PO TABS: 5.0000 mg | ORAL_TABLET | Freq: Two times a day (BID) | ORAL | Status: AC

## 2015-12-22 MED ORDER — PANTOPRAZOLE SODIUM 40 MG PO TBEC: 40.0000 mg | DELAYED_RELEASE_TABLET | Freq: Every day | ORAL | Status: DC

## 2015-12-22 NOTE — Patient Instructions (Addendum)
Nurse BP check in 2 weeks Keep mD follow up as before  Non fasting chem 7 and EGFR in 2 weeks  STOP benazepril, we have thrown this away  5 day course of prednisone sent for right shoulder pain  Script provided for nitroglycerin  Hope that you continue to improve with time

## 2015-12-22 NOTE — Progress Notes (Signed)
Subjective:    Patient ID: Kimberly Freeman, female    DOB: August 22, 1941, 75 y.o.   MRN: [id number]  HPI Patient in for follow up of recent hospitalization. Discharge summary, and laboratory and radiology data are reviewed, and any questions or concerns about recent hospitalization are discussed. Specific issues requiring follow up are specifically addressed. Ms Dam elected not to d/c the ACE per discharge instructions, was not entirely clear on this, now willing to do so. She will have blood pressure re eval and lab re eval in next 10 days. Feels much improved, denies palpitations, no chest pain, feels great. Myocardial perfusion study dione in hospital was normal. C/o new right shoulder pain, not able to exercise as she wishes as her bike has upper ext movement also Denies polyuria, polydipsia, blurred vision , or hypoglycemic episodes. Increased stress with spouse's paranoia, however does not feel this is responsible for her decompensation, he is currently out of state with his daugghter and she is happy about this as better able o focus on her health     Review of Systems See HPI Denies recent fever or chills. Denies sinus pressure, nasal congestion, ear pain or sore throat. Denies chest congestion, productive cough or wheezing. Denies chest pains, palpitations and leg swelling Denies abdominal pain, nausea, vomiting,diarrhea or constipation.   Denies dysuria, frequency, hesitancy or incontinence. . Denies headaches, seizures, numbness, or tingling. Denies depression, anxiety or insomnia. Denies skin break down or rash.        Objective:   Physical Exam  BP 140/80 mmHg  Pulse 64  Resp 18  Ht  (1.702 m)  Wt 190 lb 1.3 oz (86.22 kg)  BMI 29.76 kg/m2  SpO2 97% Patient alert and oriented and in no cardiopulmonary distress.  HEENT: No facial asymmetry, EOMI,   oropharynx pink and moist.  Neck supple no JVD, no mass.  Chest: Clear to auscultation  bilaterally.  CVS: S1, S2 no murmurs, no S3.Regular rate.  ABD: Soft non tender.   Ext: No edema  MS: Adequate ROM spine,  hips and knees.Decreased ROM right shoulder  Skin: Intact, no ulcerations or rash noted.  Psych: Good eye contact, normal affect. Memory intact not anxious or depressed appearing.  CNS: CN 2-12 intact, power,  normal throughout.no focal deficits noted.       Assessment & Plan:  Hospital discharge follow-up Recent ad mission for chest pain and palpitations. Negative cardiolyte, palpitations have resolved, feels much improved Has not d/c the ACE, needs to have this done and be re evaluated as far as blood pressure and kidney function are concerned  Pain in joint, shoulder region Current acute flare , no inciting trauma, 5 day course of prednisone  prescribed  Palpitations Resolved, dose increase in beta blocker at recent hospitalization, no chronic anti coagullation started  Essential hypertension Currently at goal, however , needs to d/c ACE , and will be re evaluated, will need med adjustment DASH diet and commitment to daily physical activity for a minimum of 30 minutes discussed and encouraged, as a part of hypertension management. The importance of attaining a healthy weight is also discussed.  BP/Weight 12/22/2015 12/15/2015 12/13/2015 11/24/2015 11/14/2015 10/03/2015 03/15/2015  Systolic BP 140 117 - 140 136 409 140  Diastolic BP 80 76 - 72 74 72 64  Wt. (Lbs) 190.08 185.85 - 188.4 - 190.12 188.04  BMI 29.76 - 29.1 29.5 - 29.77 29.44        Diabetes mellitus, insulin dependent (IDDM), controlled (  HCC) [name] is reminded of the importance of commitment to daily physical activity for 30 minutes or more, as able and the need to limit carbohydrate intake to 30 to 60 grams per meal to help with blood sugar control.   The need to take medication as prescribed, test blood sugar as directed, and to call between visits if there is a concern that blood  sugar is uncontrolled is also discussed.   [name] is reminded of the importance of daily foot exam, annual eye examination, and good blood sugar, blood pressure and cholesterol control. Controlled, no change in medication   Diabetic Labs Latest Ref Rng 12/15/2015 12/14/2015 12/13/2015 11/14/2015 09/30/2015  HbA1c 4.8 - 5.6 % - - 7.4(H) 7.2(H) 7.2(H)  Microalbumin <2.0 mg/dL - - - - -  Micro/Creat Ratio 0.0 - 30.0 mg/g - - - - -  Chol 0 - 200 mg/dL - 409(W) - 119(J) -  HDL >40 mg/dL - 52 - 55 -  Calc LDL 0 - 99 mg/dL - 478(G) - 956(O) -  Triglycerides <150 mg/dL - 130(Q) - 657(Q) -  Creatinine 0.44 - 1.00 mg/dL 4.69(G) 2.95 2.84 1.32(G) 0.94(H)   BP/Weight 12/22/2015 12/15/2015 12/13/2015 11/24/2015 11/14/2015 10/03/2015 03/15/2015  Systolic BP 140 117 - 140 136 401 140  Diastolic BP 80 76 - 72 74 72 64  Wt. (Lbs) 190.08 185.85 - 188.4 - 190.12 188.04  BMI 29.76 - 29.1 29.5 - 29.77 29.44   Foot/eye exam completion dates Latest Ref Rng 03/15/2015 05/18/2014  Eye Exam No Retinopathy - No Retinopathy  Foot exam Order - - -  Foot Form Completion - Done -         Hyperlipidemia LDL goal <100 Uncontrolled, liver function now normal. Pt has already resumed statin

## 2015-12-23 NOTE — Assessment & Plan Note (Signed)
Recent ad mission for chest pain and palpitations. Negative cardiolyte, palpitations have resolved, feels much improved Has not d/c the ACE, needs to have this done and be re evaluated as far as blood pressure and kidney function are concerned

## 2015-12-26 NOTE — Assessment & Plan Note (Signed)
Recent chest pain felt to be due to gERD, started on PPI, pt may discontinue after 2 months

## 2015-12-26 NOTE — Assessment & Plan Note (Signed)
Resolved, dose increase in beta blocker at recent hospitalization, no chronic anti coagullation started

## 2015-12-26 NOTE — Assessment & Plan Note (Signed)
Kimberly Freeman is reminded of the importance of commitment to daily physical activity for 30 minutes or more, as able and the need to limit carbohydrate intake to 30 to 60 grams per meal to help with blood sugar control.   The need to take medication as prescribed, test blood sugar as directed, and to call between visits if there is a concern that blood sugar is uncontrolled is also discussed.   Kimberly Freeman is reminded of the importance of daily foot exam, annual eye examination, and good blood sugar, blood pressure and cholesterol control. Controlled, no change in medication   Diabetic Labs Latest Ref Rng 12/15/2015 12/14/2015 12/13/2015 11/14/2015 09/30/2015  HbA1c 4.8 - 5.6 % - - 7.4(H) 7.2(H) 7.2(H)  Microalbumin <2.0 mg/dL - - - - -  Micro/Creat Ratio 0.0 - 30.0 mg/g - - - - -  Chol 0 - 200 mg/dL - 161(W264(H) - 960(A228(H) -  HDL >40 mg/dL - 52 - 55 -  Calc LDL 0 - 99 mg/dL - 540(J181(H) - 811(B140(H) -  Triglycerides <150 mg/dL - 147(W157(H) - 295(A167(H) -  Creatinine 0.44 - 1.00 mg/dL 2.13(Y1.14(H) 8.650.85 7.840.95 6.96(E1.05(H) 0.94(H)   BP/Weight 12/22/2015 12/15/2015 12/13/2015 11/24/2015 11/14/2015 10/03/2015 03/15/2015  Systolic BP 140 117 - 140 136 [id number] 140  Diastolic BP 80 76 - 72 74 72 64  Wt. (Lbs) 190.08 185.85 - 188.4 - 190.12 188.04  BMI 29.76 - 29.1 29.5 - 29.77 29.44   Foot/eye exam completion dates Latest Ref Rng 03/15/2015 05/18/2014  Eye Exam No Retinopathy - No Retinopathy  Foot exam Order - - -  Foot Form Completion - Done -

## 2015-12-26 NOTE — Assessment & Plan Note (Signed)
Currently at goal, however , needs to d/c ACE , and will be re evaluated, will need med adjustment DASH diet and commitment to daily physical activity for a minimum of 30 minutes discussed and encouraged, as a part of hypertension management. The importance of attaining a healthy weight is also discussed.  BP/Weight 12/22/2015 12/15/2015 12/13/2015 11/24/2015 11/14/2015 10/03/2015 03/15/2015  Systolic BP 140 117 - 140 136 [id number] 140  Diastolic BP 80 76 - 72 74 72 64  Wt. (Lbs) 190.08 185.85 - 188.4 - 190.12 188.04  BMI 29.76 - 29.1 29.5 - 29.77 29.44

## 2015-12-26 NOTE — Assessment & Plan Note (Signed)
Uncontrolled, liver function now normal. Pt has already resumed statin

## 2015-12-26 NOTE — Assessment & Plan Note (Signed)
Current acute flare , no inciting trauma, 5 day course of prednisone  prescribed

## 2016-01-05 ENCOUNTER — Telehealth: Payer: Self-pay

## 2016-01-05 ENCOUNTER — Ambulatory Visit: Payer: Medicare Other

## 2016-01-05 ENCOUNTER — Other Ambulatory Visit: Payer: Self-pay

## 2016-01-05 VITALS — BP 124/76

## 2016-01-05 DIAGNOSIS — R7989 Other specified abnormal findings of blood chemistry: Secondary | ICD-10-CM | POA: Diagnosis not present

## 2016-01-05 DIAGNOSIS — R799 Abnormal finding of blood chemistry, unspecified: Secondary | ICD-10-CM | POA: Diagnosis not present

## 2016-01-05 DIAGNOSIS — I10 Essential (primary) hypertension: Secondary | ICD-10-CM

## 2016-01-05 MED ORDER — ATORVASTATIN CALCIUM 40 MG PO TABS: 40.0000 mg | ORAL_TABLET | Freq: Every day | ORAL | Status: DC

## 2016-01-05 NOTE — Telephone Encounter (Signed)
No when she was d/c she was on it, and her liver enzymes had normaluized by the time she  Got in the hospital, needs to take the cholesterol med

## 2016-01-05 NOTE — Telephone Encounter (Signed)
Patient aware [facility]

## 2016-01-05 NOTE — Progress Notes (Signed)
Patient in for nurse blood pressure check.  Blood pressure checked manually.  Blood pressure within normal range.   Patient will continue medications as ordered.  Will keep next scheduled office visit.

## 2016-01-06 LAB — BASIC METABOLIC PANEL WITH GFR
BUN: 16 mg/dL (ref 7–25)
CHLORIDE: 105 mmol/L (ref 98–110)
CO2: 29 mmol/L (ref 20–31)
CREATININE: 0.99 mg/dL — AB (ref 0.60–0.93)
Calcium: 9.7 mg/dL (ref 8.6–10.4)
GFR, Est African American: 64 mL/min (ref >=60)
GFR, Est Non African American: 56 mL/min — ABNORMAL LOW (ref >=60)
GLUCOSE: 216 mg/dL — AB (ref 65–99)
POTASSIUM: 4.3 mmol/L (ref 3.5–5.3)
Sodium: 138 mmol/L (ref 135–146)

## 2016-01-23 ENCOUNTER — Telehealth: Payer: Self-pay | Admitting: Family Medicine

## 2016-01-23 ENCOUNTER — Other Ambulatory Visit: Payer: Self-pay | Admitting: Family Medicine

## 2016-01-23 ENCOUNTER — Other Ambulatory Visit: Payer: Self-pay

## 2016-01-23 MED ORDER — METFORMIN HCL ER 500 MG PO TB24: 1000.0000 mg | ORAL_TABLET | Freq: Two times a day (BID) | ORAL | Status: DC

## 2016-01-23 NOTE — Telephone Encounter (Signed)
Patient is asking for a refill on metFORMIN (GLUCOPHAGE XR) 500 MG 24 hr tablet and Lancets, please advise?

## 2016-01-23 NOTE — Telephone Encounter (Signed)
Medications refilled

## 2016-01-24 DIAGNOSIS — B351 Tinea unguium: Secondary | ICD-10-CM | POA: Diagnosis not present

## 2016-02-01 ENCOUNTER — Ambulatory Visit: Payer: Medicare Other

## 2016-03-13 ENCOUNTER — Telehealth: Payer: Self-pay | Admitting: Family Medicine

## 2016-03-13 MED ORDER — ATENOLOL 50 MG PO TABS: 75.0000 mg | ORAL_TABLET | Freq: Two times a day (BID) | ORAL | Status: DC

## 2016-03-13 NOTE — Telephone Encounter (Signed)
Call her cell number [phone]

## 2016-03-13 NOTE — Telephone Encounter (Signed)
Med sent in for corrected quantity

## 2016-03-13 NOTE — Telephone Encounter (Signed)
[name] is calling stating that her Rx for atenolol (TENORMIN) 50 MG tablet was called in wrong and she does not have the correct number of tablets, please advise?

## 2016-04-10 DIAGNOSIS — E119 Type 2 diabetes mellitus without complications: Secondary | ICD-10-CM | POA: Diagnosis not present

## 2016-04-10 DIAGNOSIS — I10 Essential (primary) hypertension: Secondary | ICD-10-CM | POA: Diagnosis not present

## 2016-04-10 DIAGNOSIS — E785 Hyperlipidemia, unspecified: Secondary | ICD-10-CM | POA: Diagnosis not present

## 2016-04-10 DIAGNOSIS — Z794 Long term (current) use of insulin: Secondary | ICD-10-CM | POA: Diagnosis not present

## 2016-04-10 LAB — COMPLETE METABOLIC PANEL WITH GFR
ALBUMIN: 4.3 g/dL (ref 3.6–5.1)
ALK PHOS: 76 U/L (ref 33–130)
ALT: 17 U/L (ref 6–29)
AST: 17 U/L (ref 10–35)
BUN: 21 mg/dL (ref 7–25)
CALCIUM: 9.5 mg/dL (ref 8.6–10.4)
CHLORIDE: 106 mmol/L (ref 98–110)
CO2: 23 mmol/L (ref 20–31)
CREATININE: 0.95 mg/dL — AB (ref 0.60–0.93)
GFR, EST AFRICAN AMERICAN: 67 mL/min (ref >=60)
GFR, Est Non African American: 58 mL/min — ABNORMAL LOW (ref >=60)
Glucose, Bld: 135 mg/dL — ABNORMAL HIGH (ref 65–99)
POTASSIUM: 4.3 mmol/L (ref 3.5–5.3)
Sodium: 140 mmol/L (ref 135–146)
Total Bilirubin: 0.5 mg/dL (ref 0.2–1.2)
Total Protein: 7.2 g/dL (ref 6.1–8.1)

## 2016-04-10 LAB — LIPID PANEL
CHOL/HDL RATIO: 3 ratio (ref <=5.0)
CHOLESTEROL: 148 mg/dL (ref 125–200)
HDL: 49 mg/dL (ref >=46)
LDL Cholesterol: 75 mg/dL (ref <130)
TRIGLYCERIDES: 118 mg/dL (ref <150)
VLDL: 24 mg/dL (ref <30)

## 2016-04-10 LAB — TSH: TSH: 3.81 mIU/L

## 2016-04-10 LAB — HEMOGLOBIN A1C
HEMOGLOBIN A1C: 7.5 % — AB (ref <5.7)
MEAN PLASMA GLUCOSE: 169 mg/dL

## 2016-04-11 ENCOUNTER — Ambulatory Visit (INDEPENDENT_AMBULATORY_CARE_PROVIDER_SITE_OTHER): Payer: Medicare Other | Admitting: Family Medicine

## 2016-04-11 ENCOUNTER — Encounter (INDEPENDENT_AMBULATORY_CARE_PROVIDER_SITE_OTHER): Payer: Self-pay

## 2016-04-11 ENCOUNTER — Encounter: Payer: Self-pay | Admitting: Family Medicine

## 2016-04-11 VITALS — BP 120/70 | HR 78 | Resp 16 | Ht 67.0 in | Wt 181.0 lb

## 2016-04-11 DIAGNOSIS — Z638 Other specified problems related to primary support group: Secondary | ICD-10-CM

## 2016-04-11 DIAGNOSIS — E119 Type 2 diabetes mellitus without complications: Secondary | ICD-10-CM

## 2016-04-11 DIAGNOSIS — E559 Vitamin D deficiency, unspecified: Secondary | ICD-10-CM

## 2016-04-11 DIAGNOSIS — IMO0001 Reserved for inherently not codable concepts without codable children: Secondary | ICD-10-CM

## 2016-04-11 DIAGNOSIS — Z794 Long term (current) use of insulin: Secondary | ICD-10-CM

## 2016-04-11 DIAGNOSIS — E785 Hyperlipidemia, unspecified: Secondary | ICD-10-CM | POA: Diagnosis not present

## 2016-04-11 DIAGNOSIS — I10 Essential (primary) hypertension: Secondary | ICD-10-CM

## 2016-04-11 DIAGNOSIS — M5432 Sciatica, left side: Secondary | ICD-10-CM

## 2016-04-11 DIAGNOSIS — Z1211 Encounter for screening for malignant neoplasm of colon: Secondary | ICD-10-CM | POA: Diagnosis not present

## 2016-04-11 DIAGNOSIS — F439 Reaction to severe stress, unspecified: Secondary | ICD-10-CM

## 2016-04-11 LAB — POC HEMOCCULT BLD/STL (OFFICE/1-CARD/DIAGNOSTIC): FECAL OCCULT BLD: NEGATIVE

## 2016-04-11 MED ORDER — KETOROLAC TROMETHAMINE 60 MG/2ML IM SOLN
60.0000 mg | Freq: Once | INTRAMUSCULAR | Status: AC
  Administered 2016-04-11: 60 mg via INTRAMUSCULAR

## 2016-04-11 MED ORDER — NAPROXEN 500 MG PO TABS: 500.0000 mg | ORAL_TABLET | Freq: Two times a day (BID) | ORAL | Status: DC

## 2016-04-11 MED ORDER — PREDNISONE 5 MG PO TABS: 5.0000 mg | ORAL_TABLET | Freq: Two times a day (BID) | ORAL | Status: AC

## 2016-04-11 MED ORDER — METHYLPREDNISOLONE ACETATE 80 MG/ML IJ SUSP
80.0000 mg | Freq: Once | INTRAMUSCULAR | Status: AC
  Administered 2016-04-11: 80 mg via INTRAMUSCULAR

## 2016-04-11 MED ORDER — HYDROCODONE-ACETAMINOPHEN 5-325 MG PO TABS: 1.0000 | ORAL_TABLET | Freq: Every evening | ORAL | Status: DC | PRN

## 2016-04-11 NOTE — Patient Instructions (Addendum)
[facility] Dec 13 or after, call if you need me before  Excellent blood pressure and labs  Foot exam is good  PLS CALL with name of eye specialist you wish to be referred to this will be due in the Summer  Injections today for sciatic pain and medications for 5 days [facility], prednisone and naproxen, script for 1 week of hydrocodone for bedtime use only if needed.  Come in September for flu vaccine  Thank you  for choosing  Primary Care. We consider it a privelige to serve you.  Delivering excellent health care in a caring and  compassionate way is our goal.  Partnering with you,  so that together we can achieve this goal is our strategy.

## 2016-04-11 NOTE — Assessment & Plan Note (Signed)
Uncontrolled.Toradol and depo medrol administered IM in the office , to be followed by a short course of oral prednisone and NSAIDS.  

## 2016-04-11 NOTE — Assessment & Plan Note (Signed)
Controlled, no change in medication Kimberly Freeman is reminded of the importance of commitment to daily physical activity for 30 minutes or more, as able and the need to limit carbohydrate intake to 30 to 60 grams per meal to help with blood sugar control.   The need to take medication as prescribed, test blood sugar as directed, and to call between visits if there is a concern that blood sugar is uncontrolled is also discussed.   Kimberly Freeman is reminded of the importance of daily foot exam, annual eye examination, and good blood sugar, blood pressure and cholesterol control.  Diabetic Labs Latest Ref Rng 04/10/2016 01/05/2016 12/15/2015 12/14/2015 12/13/2015  HbA1c <5.7 % 7.5(H) - - - 7.4(H)  Microalbumin <2.0 mg/dL - - - - -  Micro/Creat Ratio 0.0 - 30.0 mg/g - - - - -  Chol 125 - 200 mg/dL 010148 - - 272(Z264(H) -  HDL >=36>=46 mg/dL 49 - - 52 -  Calc LDL <644<130 mg/dL 75 - - 034(V181(H) -  Triglycerides <150 mg/dL 425118 - - 956(L157(H) -  Creatinine 0.60 - 0.93 mg/dL 8.75(I0.95(H) 4.33(I0.99(H) 9.51(O1.14(H) 0.85 0.95   BP/Weight 04/11/2016 01/05/2016 12/22/2015 12/15/2015 12/13/2015 11/24/2015 11/14/2015  Systolic BP 120 124 140 117 - [id number] 136  Diastolic BP 70 76 80 76 - 72 74  Wt. (Lbs) 181 - 190.08 185.85 - 188.4 -  BMI 28.34 - 29.76 - 29.1 29.5 -   Foot/eye exam completion dates Latest Ref Rng 04/11/2016 03/15/2015  Eye Exam No Retinopathy - -  Foot exam Order - - -  Foot Form Completion - Done Done

## 2016-04-11 NOTE — Assessment & Plan Note (Signed)
Controlled, no change in medication DASH diet and commitment to daily physical activity for a minimum of 30 minutes discussed and encouraged, as a part of hypertension management. The importance of attaining a healthy weight is also discussed.  BP/Weight 04/11/2016 01/05/2016 12/22/2015 12/15/2015 12/13/2015 11/24/2015 11/14/2015  Systolic BP 120 124 140 117 - [id number] 136  Diastolic BP 70 76 80 76 - 72 74  Wt. (Lbs) 181 - 190.08 185.85 - 188.4 -  BMI 28.34 - 29.76 - 29.1 29.5 -

## 2016-04-11 NOTE — Assessment & Plan Note (Signed)
Controlled, no change in medication Hyperlipidemia:Low fat diet discussed and encouraged.   Lipid Panel  Lab Results  Component Value Date   CHOL 148 04/10/2016   HDL 49 04/10/2016   LDLCALC 75 04/10/2016   TRIG 118 04/10/2016   CHOLHDL 3.0 04/10/2016

## 2016-04-11 NOTE — Progress Notes (Signed)
Kimberly Freeman     MRN: [id number]      DOB: 11-17-1940   HPI Kimberly Freeman is here for follow up and re-evaluation of chronic medical conditions, medication management and review of any available recent lab and radiology data.  Preventive health is updated, specifically  Cancer screening and Immunization.   Questions or concerns regarding consultations or procedures which the PT has had in the interim are  addressed. The PT denies any adverse reactions to current medications since the last visit. 4 days ago while getting out of her car, she felt a pull in the left buttock which radiaities down left anterior thigh, keeps her up at night, rated at 8 to 10 at night, no problem in the day. Denies lower extremity weakness or numbness Denies polyuria, polydipsia, blurred vision , or hypoglycemic episodes.     ROS Denies recent fever or chills. Denies sinus pressure, nasal congestion, ear pain or sore throat. Denies chest congestion, productive cough or wheezing. Denies chest pains, palpitations and leg swelling Denies abdominal pain, nausea, vomiting,diarrhea or constipation.   Denies dysuria, frequency, hesitancy or incontinence. Denies headaches, seizures, numbness, or tingling. Denies depression, uncontrolled anxiety or insomnia. Denies skin break down or rash.   PE  BP 120/70 mmHg  Pulse 78  Resp 16  Ht 5\' 7"  (1.702 m)  Wt 181 lb (82.101 kg)  BMI 28.34 kg/m2  SpO2 98%  Patient alert and oriented and in no cardiopulmonary distress.  HEENT: No facial asymmetry, EOMI,   oropharynx pink and moist.  Neck supple no JVD, no mass.  Chest: Clear to auscultation bilaterally.  CVS: S1, S2 no murmurs, no S3.Regular rate.  ABD: Soft non tender. No organomegaly or mass Rectal: no mass, heme negative stool  Ext: No edema  MS: decreased  ROM lumbar  spine, normal in shoulders, hips and knees.  Skin: Intact, no ulcerations or rash noted.  Psych: Good eye contact, normal affect.  Memory intact not anxious or depressed appearing.  CNS: CN 2-12 intact, power,  normal throughout.no focal deficits noted.   Assessment & Plan   Essential hypertension Controlled, no change in medication DASH diet and commitment to daily physical activity for a minimum of 30 minutes discussed and encouraged, as a part of hypertension management. The importance of attaining a healthy weight is also discussed.  BP/Weight 04/11/2016 01/05/2016 12/22/2015 12/15/2015 12/13/2015 11/24/2015 11/14/2015  Systolic BP 120 124 140 117 - 409140 136  Diastolic BP 70 76 80 76 - 72 74  Wt. (Lbs) 181 - 190.08 185.85 - 188.4 -  BMI 28.34 - 29.76 - 29.1 29.5 -        Diabetes mellitus, insulin dependent (IDDM), controlled (HCC) Controlled, no change in medication Kimberly Freeman is reminded of the importance of commitment to daily physical activity for 30 minutes or more, as able and the need to limit carbohydrate intake to 30 to 60 grams per meal to help with blood sugar control.   The need to take medication as prescribed, test blood sugar as directed, and to call between visits if there is a concern that blood sugar is uncontrolled is also discussed.   Kimberly Freeman is reminded of the importance of daily foot exam, annual eye examination, and good blood sugar, blood pressure and cholesterol control.  Diabetic Labs Latest Ref Rng 04/10/2016 01/05/2016 12/15/2015 12/14/2015 12/13/2015  HbA1c <5.7 % 7.5(H) - - - 7.4(H)  Microalbumin <2.0 mg/dL - - - - -  Micro/Creat Ratio 0.0 -  30.0 mg/g - - - - -  Chol 125 - 200 mg/dL 161 - - 096(E) -  HDL >=45 mg/dL 49 - - 52 -  Calc LDL <409 mg/dL 75 - - 811(B) -  Triglycerides <150 mg/dL 147 - - 829(F) -  Creatinine 0.60 - 0.93 mg/dL 6.21(H) 0.86(V) 7.84(O) 0.85 0.95   BP/Weight 04/11/2016 01/05/2016 12/22/2015 12/15/2015 12/13/2015 11/24/2015 11/14/2015  Systolic BP 120 124 140 117 - 962 136  Diastolic BP 70 76 80 76 - 72 74  Wt. (Lbs) 181 - 190.08 185.85 - 188.4 -  BMI 28.34 -  29.76 - 29.1 29.5 -   Foot/eye exam completion dates Latest Ref Rng 04/11/2016 03/15/2015  Eye Exam No Retinopathy - -  Foot exam Order - - -  Foot Form Completion - Done Done         Hyperlipidemia LDL goal <100 Controlled, no change in medication Hyperlipidemia:Low fat diet discussed and encouraged.   Lipid Panel  Lab Results  Component Value Date   CHOL 148 04/10/2016   HDL 49 04/10/2016   LDLCALC 75 04/10/2016   TRIG 118 04/10/2016   CHOLHDL 3.0 04/10/2016        Sciatica, left side Uncontrolled.Toradol and depo medrol administered IM in the office , to be followed by a short course of oral prednisone and NSAIDS.   Stress at home Improved coping skill as far as managing her spouse with dementia

## 2016-04-11 NOTE — Assessment & Plan Note (Signed)
Improved coping skill as far as managing her spouse with dementia

## 2016-04-17 DIAGNOSIS — E1142 Type 2 diabetes mellitus with diabetic polyneuropathy: Secondary | ICD-10-CM | POA: Diagnosis not present

## 2016-04-17 DIAGNOSIS — B351 Tinea unguium: Secondary | ICD-10-CM | POA: Diagnosis not present

## 2016-05-08 ENCOUNTER — Other Ambulatory Visit: Payer: Self-pay | Admitting: Family Medicine

## 2016-05-17 ENCOUNTER — Other Ambulatory Visit: Payer: Self-pay | Admitting: Family Medicine

## 2016-06-07 ENCOUNTER — Other Ambulatory Visit: Payer: Self-pay | Admitting: Family Medicine

## 2016-06-28 ENCOUNTER — Ambulatory Visit (INDEPENDENT_AMBULATORY_CARE_PROVIDER_SITE_OTHER): Payer: Medicare Other

## 2016-06-28 DIAGNOSIS — Z23 Encounter for immunization: Secondary | ICD-10-CM | POA: Diagnosis not present

## 2016-07-03 DIAGNOSIS — E1142 Type 2 diabetes mellitus with diabetic polyneuropathy: Secondary | ICD-10-CM | POA: Diagnosis not present

## 2016-07-03 DIAGNOSIS — B351 Tinea unguium: Secondary | ICD-10-CM | POA: Diagnosis not present

## 2016-07-27 ENCOUNTER — Other Ambulatory Visit: Payer: Self-pay | Admitting: Family Medicine

## 2016-07-27 ENCOUNTER — Telehealth: Payer: Self-pay | Admitting: Family Medicine

## 2016-07-27 MED ORDER — METOPROLOL TARTRATE 50 MG PO TABS: 50.0000 mg | ORAL_TABLET | Freq: Two times a day (BID) | ORAL | 3 refills | Status: DC

## 2016-07-27 MED ORDER — METOPROLOL TARTRATE 50 MG PO TABS
50.0000 mg | ORAL_TABLET | Freq: Two times a day (BID) | ORAL | 3 refills | Status: DC
Start: 2016-07-27 — End: 2016-07-27

## 2016-07-27 NOTE — Telephone Encounter (Signed)
Pt aware and med sent  

## 2016-07-27 NOTE — Telephone Encounter (Signed)
Atenolol changed to metoprolol and script prionted, pls notify both pharmacy and pt  Needs nurse bP  And pulse check in 4 to 5 weeks also on new med please sched and explain the importance

## 2016-07-27 NOTE — Telephone Encounter (Signed)
Staff member to follow through

## 2016-08-01 ENCOUNTER — Other Ambulatory Visit: Payer: Self-pay | Admitting: Family Medicine

## 2016-08-01 DIAGNOSIS — Z1231 Encounter for screening mammogram for malignant neoplasm of breast: Secondary | ICD-10-CM

## 2016-08-08 ENCOUNTER — Ambulatory Visit (HOSPITAL_COMMUNITY)
Admission: RE | Admit: 2016-08-08 | Discharge: 2016-08-08 | Disposition: A | Discharge status: Home / Self Care | Payer: Medicare Other | Source: Ambulatory Visit | Attending: Family Medicine | Admitting: Family Medicine

## 2016-08-08 DIAGNOSIS — Z1231 Encounter for screening mammogram for malignant neoplasm of breast: Secondary | ICD-10-CM | POA: Insufficient documentation

## 2016-08-27 ENCOUNTER — Ambulatory Visit: Payer: Medicare Other

## 2016-08-27 VITALS — BP 124/72

## 2016-08-27 DIAGNOSIS — I10 Essential (primary) hypertension: Secondary | ICD-10-CM

## 2016-08-28 NOTE — Progress Notes (Signed)
BP in the normal range after patient sat in room at rest for 5 minutes. Advised to continue same meds and keep her next appt for follow up

## 2016-09-04 DIAGNOSIS — E119 Type 2 diabetes mellitus without complications: Secondary | ICD-10-CM | POA: Diagnosis not present

## 2016-09-04 DIAGNOSIS — E559 Vitamin D deficiency, unspecified: Secondary | ICD-10-CM | POA: Diagnosis not present

## 2016-09-04 DIAGNOSIS — E785 Hyperlipidemia, unspecified: Secondary | ICD-10-CM | POA: Diagnosis not present

## 2016-09-04 DIAGNOSIS — Z794 Long term (current) use of insulin: Secondary | ICD-10-CM | POA: Diagnosis not present

## 2016-09-04 LAB — HEMOGLOBIN A1C
HEMOGLOBIN A1C: 7.6 % — AB (ref <5.7)
Mean Plasma Glucose: 171 mg/dL

## 2016-09-05 LAB — VITAMIN D 25 HYDROXY (VIT D DEFICIENCY, FRACTURES): Vit D, 25-Hydroxy: 40 ng/mL (ref 30–100)

## 2016-09-05 LAB — COMPLETE METABOLIC PANEL WITH GFR
ALBUMIN: 4.1 g/dL (ref 3.6–5.1)
ALK PHOS: 73 U/L (ref 33–130)
ALT: 17 U/L (ref 6–29)
AST: 19 U/L (ref 10–35)
BILIRUBIN TOTAL: 0.6 mg/dL (ref 0.2–1.2)
BUN: 14 mg/dL (ref 7–25)
CALCIUM: 9.5 mg/dL (ref 8.6–10.4)
CO2: 28 mmol/L (ref 20–31)
CREATININE: 0.89 mg/dL (ref 0.60–0.93)
Chloride: 104 mmol/L (ref 98–110)
GFR, Est African American: 72 mL/min (ref >=60)
GFR, Est Non African American: 63 mL/min (ref >=60)
GLUCOSE: 145 mg/dL — AB (ref 65–99)
POTASSIUM: 4.5 mmol/L (ref 3.5–5.3)
SODIUM: 141 mmol/L (ref 135–146)
TOTAL PROTEIN: 6.7 g/dL (ref 6.1–8.1)

## 2016-09-05 LAB — LIPID PANEL
CHOLESTEROL: 162 mg/dL (ref <200)
HDL: 50 mg/dL — ABNORMAL LOW (ref >50)
LDL Cholesterol: 86 mg/dL (ref <100)
Total CHOL/HDL Ratio: 3.2 Ratio (ref <5.0)
Triglycerides: 130 mg/dL (ref <150)
VLDL: 26 mg/dL (ref <30)

## 2016-09-11 ENCOUNTER — Other Ambulatory Visit: Payer: Self-pay | Admitting: Family Medicine

## 2016-09-11 DIAGNOSIS — E1142 Type 2 diabetes mellitus with diabetic polyneuropathy: Secondary | ICD-10-CM | POA: Diagnosis not present

## 2016-09-11 DIAGNOSIS — B351 Tinea unguium: Secondary | ICD-10-CM | POA: Diagnosis not present

## 2016-09-12 ENCOUNTER — Other Ambulatory Visit: Payer: Self-pay

## 2016-09-12 MED ORDER — GLUCOSE BLOOD VI STRP: ORAL_STRIP | 1 refills | Status: DC

## 2016-10-04 ENCOUNTER — Ambulatory Visit (INDEPENDENT_AMBULATORY_CARE_PROVIDER_SITE_OTHER): Payer: Medicare Other

## 2016-10-04 VITALS — BP 140/70 | HR 68 | Temp 97.6°F | Resp 18 | Ht 67.0 in | Wt 191.1 lb

## 2016-10-04 DIAGNOSIS — Z0000 Encounter for general adult medical examination without abnormal findings: Secondary | ICD-10-CM

## 2016-10-04 MED ORDER — METFORMIN HCL ER 500 MG PO TB24: 1000.0000 mg | ORAL_TABLET | Freq: Two times a day (BID) | ORAL | 0 refills | Status: DC

## 2016-10-04 MED ORDER — ATORVASTATIN CALCIUM 40 MG PO TABS: 40.0000 mg | ORAL_TABLET | Freq: Every day | ORAL | 0 refills | Status: DC

## 2016-10-04 NOTE — Progress Notes (Signed)
Subjective:   Kimberly Freeman is a 75 y.o. female who presents for Medicare Annual (Subsequent) preventive examination.  Review of Systems:  Cardiac Risk Factors include: advanced age (>6555men, 86>65 women);diabetes mellitus;dyslipidemia;family history of premature cardiovascular disease;hypertension     Objective:     Vitals: BP 140/70   Pulse 68   Temp 97.6 F (36.4 C) (Oral)   Resp 18   Ht 5\' 7"  (1.702 m)   Wt 191 lb 1.9 oz (86.7 kg)   SpO2 94%   BMI 29.93 kg/m   Body mass index is 29.93 kg/m.   Tobacco History  Smoking Status  . Never Smoker  Smokeless Tobacco  . Never Used     Counseling given: Not Answered   Past Medical History:  Diagnosis Date  . Diabetes mellitus, type 2 (HCC)   . Essential hypertension   . GERD (gastroesophageal reflux disease)   . Hyperlipidemia   . Obesity    Past Surgical History:  Procedure Laterality Date  . ABDOMINAL HYSTERECTOMY    . CHOLECYSTECTOMY  2004   Family History  Problem Relation Age of Onset  . Leukemia Mother   . Cancer Mother   . Prostate cancer Father   . Lung cancer Sister [id number]  . Diabetes Sister    History  Sexual Activity  . Sexual activity: Not Currently  . Partners: Male    Outpatient Encounter Prescriptions as of 10/04/2016  Medication Sig  . acetaminophen (TYLENOL) 500 MG tablet Take 500 mg by mouth as needed. For pain  . aspirin 325 MG tablet Take 325 mg by mouth every morning.  Marland Kitchen. atorvastatin (LIPITOR) 40 MG tablet Take 1 tablet (40 mg total) by mouth at bedtime.  Marland Kitchen. glipiZIDE (GLUCOTROL XL) 5 MG 24 hr tablet TAKE 1 TABLET BY MOUTH DAILY  . glucose blood test strip (Onetouch Ultrablue ) Use as instructed for twice daily testing  . Insulin Glargine (LANTUS SOLOSTAR) 100 UNIT/ML Solostar Pen Inject 50 Units into the skin daily at 10 pm.  . irbesartan (AVAPRO) 300 MG tablet TAKE 1 TABLET BY MOUTH DAILY  . Lancets (ONETOUCH ULTRASOFT) lancets USE AS DIRECTED TWICE DAILY  . metFORMIN (GLUCOPHAGE-XR)  500 MG 24 hr tablet Take 2 tablets (1,000 mg total) by mouth 2 (two) times daily.  . metoprolol (LOPRESSOR) 50 MG tablet Take 1 tablet (50 mg total) by mouth 2 (two) times daily.  . Multiple Vitamin (MULTIVITAMIN WITH MINERALS) TABS tablet Take 1 tablet by mouth daily.  . naproxen (NAPROSYN) 500 MG tablet Take 1 tablet (500 mg total) by mouth 2 (two) times daily with a meal.  . nitroGLYCERIN (NITROSTAT) 0.4 MG SL tablet Place 1 tablet (0.4 mg total) under the tongue every 5 (five) minutes as needed for chest pain.  . pantoprazole (PROTONIX) 40 MG tablet Take 1 tablet (40 mg total) by mouth daily. (Patient taking differently: Take 40 mg by mouth daily as needed. )  . spironolactone (ALDACTONE) 25 MG tablet TAKE 1 TABLET BY MOUTH DAILY  . [DISCONTINUED] atorvastatin (LIPITOR) 40 MG tablet TAKE 1 TABLET BY MOUTH AT BEDTIME  . [DISCONTINUED] metFORMIN (GLUCOPHAGE-XR) 500 MG 24 hr tablet TAKE 2 TABLETS BY MOUTH TWICE DAILY  . [DISCONTINUED] HYDROcodone-acetaminophen (NORCO/VICODIN) 5-325 MG tablet Take 1 tablet by mouth at bedtime as needed for moderate pain. (Patient not taking: Reported on 10/04/2016)   No facility-administered encounter medications on file as of 10/04/2016.     Activities of Daily Living In your present state of health, do you  have any difficulty performing the following activities: 10/04/2016 12/13/2015  Hearing? N N  Vision? N N  Difficulty concentrating or making decisions? N N  Walking or climbing stairs? N N  Dressing or bathing? N N  Doing errands, shopping? N N  Preparing Food and eating ? N -  Using the Toilet? N -  In the past six months, have you accidently leaked urine? N -  Do you have problems with loss of bowel control? N -  Managing your Medications? N -  Managing your Finances? N -  Housekeeping or managing your Housekeeping? N -  Some recent data might be hidden    Patient Care Team: Kerri PerchesMargaret E Simpson, MD as PCP - General Laqueta LindenSuresh A Koneswaran, MD as  Attending Physician (Cardiology) Ferman HammingBenjamin McKinney, DPM as Consulting Physician (Podiatry)    Assessment:    Exercise Activities and Dietary recommendations Current Exercise Habits: The patient does not participate in regular exercise at present  Goals    . Exercise 5x per week (60 min per time)          Starting 10/04/2016 patient would like to start back on her exercise bicycle 5 times a week for 60 minutes at a time.      Fall Risk Fall Risk  10/04/2016 04/11/2016 11/24/2015 10/03/2015 03/15/2015  Falls in the past year? No No No No No  Risk for fall due to : Impaired vision - - - -   Depression Screen PHQ 2/9 Scores 10/04/2016 03/15/2015 09/22/2013 06/07/2013  PHQ - 2 Score 0 0 0 0  PHQ- 9 Score - - 2 -     Cognitive Function  Normal   6CIT Screen 10/04/2016  What Year? 0 points  What month? 0 points  What time? 0 points  Count back from 20 0 points  Months in reverse 0 points  Repeat phrase 0 points  Total Score 0    Immunization History  Administered Date(s) Administered  . Influenza Split 08/01/2011, 07/31/2012  . Influenza Whole 07/29/2007, 07/20/2008, 07/21/2009, 07/19/2010  . Influenza,inj,Quad PF,36+ Mos 08/07/2013, 06/15/2014, 07/19/2015, 06/28/2016  . Pneumococcal Conjugate-13 10/12/2014  . Pneumococcal Polysaccharide-23 03/16/2004, 08/29/2012  . Td 03/16/2004  . Tdap 03/31/2012  . Zoster 01/02/2007   Screening Tests Health Maintenance  Topic Date Due  . OPHTHALMOLOGY EXAM  05/19/2015  . HEMOGLOBIN A1C  03/04/2017  . FOOT EXAM  04/11/2017  . TETANUS/TDAP  03/31/2022  . INFLUENZA VACCINE  Completed  . DEXA SCAN  Completed  . ZOSTAVAX  Completed  . PNA vac Low Risk Adult  Completed      Plan:  I have personally reviewed and addressed the Medicare Annual Wellness questionnaire and have noted the following in the patient's chart:  A. Medical and social history B. Use of alcohol, tobacco or illicit drugs  C. Current medications and  supplements D. Functional ability and status E.  Nutritional status F.  Physical activity G. Advance directives H. List of other physicians I.  Hospitalizations, surgeries, and ER visits in previous 12 months J.  Vitals K. Screenings to include hearing, vision, cognitive, depression L. Referrals and appointments - none  In addition, I have reviewed and discussed with patient certain preventive protocols, quality metrics, and best practice recommendations. A written personalized care plan for preventive services as well as general preventive health recommendations were provided to patient.  Signed,   Candis ShineKrystal Harrison, LPN Lead Nurse Health Advisor

## 2016-10-04 NOTE — Patient Instructions (Addendum)
Please bring a copy of your POA (Power of [place]) and/or Living Will to your next appointment.   Health maintenance: Up to date on all health maintenance   Abnormal screenings: None   Patient concerns: Needs Atorvastatin and Metformin refilled   Nurse concerns: None, Atorvastatin and Metformin were refilled today   Next PCP appt: Please return in 3 months for f/u with Dr. Moshe Cipro, if you need lab work we will mail that to you.   Health Maintenance, Female Introduction Adopting a healthy lifestyle and getting preventive care can go a long way to promote health and wellness. Talk with your health care provider about what schedule of regular examinations is right for you. This is a good chance for you to check in with your provider about disease prevention and staying healthy. In between checkups, there are plenty of things you can do on your own. Experts have done a lot of research about which lifestyle changes and preventive measures are most likely to keep you healthy. Ask your health care provider for more information. Weight and diet Eat a healthy diet  Be sure to include plenty of vegetables, fruits, low-fat dairy products, and lean protein.  Do not eat a lot of foods high in solid fats, added sugars, or salt.  Get regular exercise. This is one of the most important things you can do for your health.  Most adults should exercise for at least 150 minutes each week. The exercise should increase your heart rate and make you sweat (moderate-intensity exercise).  Most adults should also do strengthening exercises at least twice a week. This is in addition to the moderate-intensity exercise. Maintain a healthy weight  Body mass index (BMI) is a measurement that can be used to identify possible weight problems. It estimates body fat based on height and weight. Your health care provider can help determine your BMI and help you achieve or maintain a healthy weight.  For females 74  years of age and older:  A BMI below 18.5 is considered underweight.  A BMI of 18.5 to 24.9 is normal.  A BMI of 25 to 29.9 is considered overweight.  A BMI of 30 and above is considered obese. Watch levels of cholesterol and blood lipids  You should start having your blood tested for lipids and cholesterol at 77 years of age, then have this test every 5 years.  You may need to have your cholesterol levels checked more often if:  Your lipid or cholesterol levels are high.  You are older than 77 years of age.  You are at high risk for heart disease. Cancer screening Lung Cancer  Lung cancer screening is recommended for adults 25-80 years old who are at high risk for lung cancer because of a history of smoking.  A yearly low-dose CT scan of the lungs is recommended for people who:  Currently smoke.  Have quit within the past 15 years.  Have at least a 30-pack-year history of smoking. A pack year is smoking an average of one pack of cigarettes a day for 1 year.  Yearly screening should continue until it has been 15 years since you quit.  Yearly screening should stop if you develop a health problem that would prevent you from having lung cancer treatment. Breast Cancer  Practice breast self-awareness. This means understanding how your breasts normally appear and feel.  It also means doing regular breast self-exams. Let your health care provider know about any changes, no matter how small.  If you are in your 20s or 30s, you should have a clinical breast exam (CBE) by a health care provider every 1-3 years as part of a regular health exam.  If you are 87 or older, have a CBE every year. Also consider having a breast X-ray (mammogram) every year.  If you have a family history of breast cancer, talk to your health care provider about genetic screening.  If you are at high risk for breast cancer, talk to your health care provider about having an MRI and a mammogram every  year.  Breast cancer gene (BRCA) assessment is recommended for women who have family members with BRCA-related cancers. BRCA-related cancers include:  Breast.  Ovarian.  Tubal.  Peritoneal cancers.  Results of the assessment will determine the need for genetic counseling and BRCA1 and BRCA2 testing. Cervical Cancer  Your health care provider may recommend that you be screened regularly for cancer of the pelvic organs (ovaries, uterus, and vagina). This screening involves a pelvic examination, including checking for microscopic changes to the surface of your cervix (Pap test). You may be encouraged to have this screening done every 3 years, beginning at age 20.  For women ages 55-65, health care providers may recommend pelvic exams and Pap testing every 3 years, or they may recommend the Pap and pelvic exam, combined with testing for human papilloma virus (HPV), every 5 years. Some types of HPV increase your risk of cervical cancer. Testing for HPV may also be done on women of any age with unclear Pap test results.  Other health care providers may not recommend any screening for nonpregnant women who are considered low risk for pelvic cancer and who do not have symptoms. Ask your health care provider if a screening pelvic exam is right for you.  If you have had past treatment for cervical cancer or a condition that could lead to cancer, you need Pap tests and screening for cancer for at least 20 years after your treatment. If Pap tests have been discontinued, your risk factors (such as having a new sexual partner) need to be reassessed to determine if screening should resume. Some women have medical problems that increase the chance of getting cervical cancer. In these cases, your health care provider may recommend more frequent screening and Pap tests. Colorectal Cancer  This type of cancer can be detected and often prevented.  Routine colorectal cancer screening usually begins at 77 years  of age and continues through 77 years of age.  Your health care provider may recommend screening at an earlier age if you have risk factors for colon cancer.  Your health care provider may also recommend using home test kits to check for hidden blood in the stool.  A small camera at the end of a tube can be used to examine your colon directly (sigmoidoscopy or colonoscopy). This is done to check for the earliest forms of colorectal cancer.  Routine screening usually begins at age 1.  Direct examination of the colon should be repeated every 5-10 years through 77 years of age. However, you may need to be screened more often if early forms of precancerous polyps or small growths are found. Skin Cancer  Check your skin from head to toe regularly.  Tell your health care provider about any new moles or changes in moles, especially if there is a change in a mole's shape or color.  Also tell your health care provider if you have a mole that is larger than  the size of a pencil eraser.  Always use sunscreen. Apply sunscreen liberally and repeatedly throughout the day.  Protect yourself by wearing long sleeves, pants, a wide-brimmed hat, and sunglasses whenever you are outside. Heart disease, diabetes, and high blood pressure  High blood pressure causes heart disease and increases the risk of stroke. High blood pressure is more likely to develop in:  People who have blood pressure in the high end of the normal range (130-139/85-89 mm Hg).  People who are overweight or obese.  People who are African American.  If you are 91-64 years of age, have your blood pressure checked every 3-5 years. If you are 53 years of age or older, have your blood pressure checked every year. You should have your blood pressure measured twice-once when you are at a hospital or clinic, and once when you are not at a hospital or clinic. Record the average of the two measurements. To check your blood pressure when you  are not at a hospital or clinic, you can use:  An automated blood pressure machine at a pharmacy.  A home blood pressure monitor.  If you are between 13 years and 68 years old, ask your health care provider if you should take aspirin to prevent strokes.  Have regular diabetes screenings. This involves taking a blood sample to check your fasting blood sugar level.  If you are at a normal weight and have a low risk for diabetes, have this test once every three years after 77 years of age.  If you are overweight and have a high risk for diabetes, consider being tested at a younger age or more often. Preventing infection Hepatitis B  If you have a higher risk for hepatitis B, you should be screened for this virus. You are considered at high risk for hepatitis B if:  You were born in a country where hepatitis B is common. Ask your health care provider which countries are considered high risk.  Your parents were born in a high-risk country, and you have not been immunized against hepatitis B (hepatitis B vaccine).  You have HIV or AIDS.  You use needles to inject street drugs.  You live with someone who has hepatitis B.  You have had sex with someone who has hepatitis B.  You get hemodialysis treatment.  You take certain medicines for conditions, including cancer, organ transplantation, and autoimmune conditions. Hepatitis C  Blood testing is recommended for:  Everyone born from [birth year] through 1967.  Anyone with known risk factors for hepatitis C. Sexually transmitted infections (STIs)  You should be screened for sexually transmitted infections (STIs) including gonorrhea and chlamydia if:  You are sexually active and are younger than 77 years of age.  You are older than 77 years of age and your health care provider tells you that you are at risk for this type of infection.  Your sexual activity has changed since you were last screened and you are at an increased risk for  chlamydia or gonorrhea. Ask your health care provider if you are at risk.  If you do not have HIV, but are at risk, it may be recommended that you take a prescription medicine daily to prevent HIV infection. This is called pre-exposure prophylaxis (PrEP). You are considered at risk if:  You are sexually active and do not regularly use condoms or know the HIV status of your partner(s).  You take drugs by injection.  You are sexually active with a partner who has  HIV. Talk with your health care provider about whether you are at high risk of being infected with HIV. If you choose to begin PrEP, you should first be tested for HIV. You should then be tested every 3 months for as long as you are taking PrEP. Pregnancy  If you are premenopausal and you may become pregnant, ask your health care provider about preconception counseling.  If you may become pregnant, take 400 to 800 micrograms (mcg) of folic acid every day.  If you want to prevent pregnancy, talk to your health care provider about birth control (contraception). Osteoporosis and menopause  Osteoporosis is a disease in which the bones lose minerals and strength with aging. This can result in serious bone fractures. Your risk for osteoporosis can be identified using a bone density scan.  If you are 62 years of age or older, or if you are at risk for osteoporosis and fractures, ask your health care provider if you should be screened.  Ask your health care provider whether you should take a calcium or vitamin D supplement to lower your risk for osteoporosis.  Menopause may have certain physical symptoms and risks.  Hormone replacement therapy may reduce some of these symptoms and risks. Talk to your health care provider about whether hormone replacement therapy is right for you. Follow these instructions at home:  Schedule regular health, dental, and eye exams.  Stay current with your immunizations.  Do not use any tobacco products  including cigarettes, chewing tobacco, or electronic cigarettes.  If you are pregnant, do not drink alcohol.  If you are breastfeeding, limit how much and how often you drink alcohol.  Limit alcohol intake to no more than 1 drink per day for nonpregnant women. One drink equals 12 ounces of beer, 5 ounces of wine, or 1 ounces of hard liquor.  Do not use street drugs.  Do not share needles.  Ask your health care provider for help if you need support or information about quitting drugs.  Tell your health care provider if you often feel depressed.  Tell your health care provider if you have ever been abused or do not feel safe at home. This information is not intended to replace advice given to you by your health care provider. Make sure you discuss any questions you have with your health care provider. Document Released: 04/23/2011 Document Revised: 03/15/2016 Document Reviewed: 07/12/2015  2017 Elsevier

## 2016-10-12 ENCOUNTER — Telehealth: Payer: Self-pay

## 2016-10-12 DIAGNOSIS — Z794 Long term (current) use of insulin: Secondary | ICD-10-CM

## 2016-10-12 DIAGNOSIS — E119 Type 2 diabetes mellitus without complications: Secondary | ICD-10-CM

## 2016-10-12 DIAGNOSIS — IMO0001 Reserved for inherently not codable concepts without codable children: Secondary | ICD-10-CM

## 2016-10-12 DIAGNOSIS — I10 Essential (primary) hypertension: Secondary | ICD-10-CM

## 2016-10-12 NOTE — Telephone Encounter (Signed)
Labs to have done prior to visit ordered and mailed to patient  

## 2016-11-20 DIAGNOSIS — B351 Tinea unguium: Secondary | ICD-10-CM | POA: Diagnosis not present

## 2016-11-20 DIAGNOSIS — E1142 Type 2 diabetes mellitus with diabetic polyneuropathy: Secondary | ICD-10-CM | POA: Diagnosis not present

## 2017-01-02 ENCOUNTER — Other Ambulatory Visit: Payer: Self-pay | Admitting: Family Medicine

## 2017-01-02 DIAGNOSIS — E119 Type 2 diabetes mellitus without complications: Secondary | ICD-10-CM | POA: Diagnosis not present

## 2017-01-02 DIAGNOSIS — I10 Essential (primary) hypertension: Secondary | ICD-10-CM | POA: Diagnosis not present

## 2017-01-02 DIAGNOSIS — Z794 Long term (current) use of insulin: Secondary | ICD-10-CM | POA: Diagnosis not present

## 2017-01-02 DIAGNOSIS — IMO0001 Reserved for inherently not codable concepts without codable children: Secondary | ICD-10-CM

## 2017-01-02 DIAGNOSIS — D539 Nutritional anemia, unspecified: Secondary | ICD-10-CM | POA: Diagnosis not present

## 2017-01-02 LAB — CBC
HEMATOCRIT: 33.4 % — AB (ref 35.0–45.0)
HEMOGLOBIN: 10.7 g/dL — AB (ref 11.7–15.5)
MCH: 28.5 pg (ref 27.0–33.0)
MCHC: 32 g/dL (ref 32.0–36.0)
MCV: 89.1 fL (ref 80.0–100.0)
MPV: 10.6 fL (ref 7.5–12.5)
Platelets: 243 10*3/uL (ref 140–400)
RBC: 3.75 MIL/uL — ABNORMAL LOW (ref 3.80–5.10)
RDW: 14 % (ref 11.0–15.0)
WBC: 5.5 10*3/uL (ref 3.8–10.8)

## 2017-01-03 LAB — BASIC METABOLIC PANEL WITH GFR
BUN: 14 mg/dL (ref 7–25)
CHLORIDE: 107 mmol/L (ref 98–110)
CO2: 29 mmol/L (ref 20–31)
CREATININE: 0.94 mg/dL — AB (ref 0.60–0.93)
Calcium: 9.4 mg/dL (ref 8.6–10.4)
GFR, Est African American: 68 mL/min (ref >=60)
GFR, Est Non African American: 59 mL/min — ABNORMAL LOW (ref >=60)
Glucose, Bld: 122 mg/dL — ABNORMAL HIGH (ref 65–99)
POTASSIUM: 4 mmol/L (ref 3.5–5.3)
SODIUM: 142 mmol/L (ref 135–146)

## 2017-01-03 LAB — HEMOGLOBIN A1C
HEMOGLOBIN A1C: 8.5 % — AB (ref <5.7)
MEAN PLASMA GLUCOSE: 197 mg/dL

## 2017-01-05 LAB — IRON,TIBC AND FERRITIN PANEL
%SAT: 25 % (ref 11–50)
Ferritin: 104 ng/mL (ref 20–288)
Iron: 73 ug/dL (ref 45–160)
TIBC: 290 ug/dL (ref 250–450)

## 2017-01-07 ENCOUNTER — Other Ambulatory Visit: Payer: Self-pay

## 2017-01-07 ENCOUNTER — Telehealth: Payer: Self-pay

## 2017-01-07 MED ORDER — INSULIN PEN NEEDLE 30G X 8 MM MISC: 1.0000 | 5 refills | Status: DC

## 2017-01-07 NOTE — Telephone Encounter (Signed)
lantus not covered.  Alternatives are levemir, basaglar, and tresiba. Would you like to change?

## 2017-01-07 NOTE — Telephone Encounter (Signed)
Send levemir same dose please

## 2017-01-08 ENCOUNTER — Ambulatory Visit (INDEPENDENT_AMBULATORY_CARE_PROVIDER_SITE_OTHER): Payer: Medicare Other | Admitting: Family Medicine

## 2017-01-08 ENCOUNTER — Other Ambulatory Visit: Payer: Self-pay

## 2017-01-08 ENCOUNTER — Encounter: Payer: Self-pay | Admitting: Family Medicine

## 2017-01-08 VITALS — BP 140/70 | HR 71 | Resp 15 | Ht 67.0 in | Wt 191.0 lb

## 2017-01-08 DIAGNOSIS — I10 Essential (primary) hypertension: Secondary | ICD-10-CM

## 2017-01-08 DIAGNOSIS — E119 Type 2 diabetes mellitus without complications: Secondary | ICD-10-CM

## 2017-01-08 DIAGNOSIS — IMO0001 Reserved for inherently not codable concepts without codable children: Secondary | ICD-10-CM

## 2017-01-08 DIAGNOSIS — E785 Hyperlipidemia, unspecified: Secondary | ICD-10-CM

## 2017-01-08 DIAGNOSIS — F439 Reaction to severe stress, unspecified: Secondary | ICD-10-CM

## 2017-01-08 DIAGNOSIS — Z794 Long term (current) use of insulin: Secondary | ICD-10-CM

## 2017-01-08 DIAGNOSIS — D649 Anemia, unspecified: Secondary | ICD-10-CM

## 2017-01-08 DIAGNOSIS — Z1211 Encounter for screening for malignant neoplasm of colon: Secondary | ICD-10-CM

## 2017-01-08 LAB — POC HEMOCCULT BLD/STL (OFFICE/1-CARD/DIAGNOSTIC): Fecal Occult Blood, POC: NEGATIVE

## 2017-01-08 MED ORDER — INSULIN DETEMIR 100 UNIT/ML FLEXPEN: 50.0000 [IU] | PEN_INJECTOR | Freq: Every day | SUBCUTANEOUS | 1 refills | Status: DC

## 2017-01-08 MED ORDER — IRBESARTAN 300 MG PO TABS: 300.0000 mg | ORAL_TABLET | Freq: Every day | ORAL | 1 refills | Status: DC

## 2017-01-08 MED ORDER — PANTOPRAZOLE SODIUM 40 MG PO TBEC: 40.0000 mg | DELAYED_RELEASE_TABLET | Freq: Every day | ORAL | 1 refills | Status: DC

## 2017-01-08 MED ORDER — GLIPIZIDE ER 5 MG PO TB24: 5.0000 mg | ORAL_TABLET | Freq: Every day | ORAL | 1 refills | Status: DC

## 2017-01-08 NOTE — Telephone Encounter (Signed)
Sent in

## 2017-01-08 NOTE — Patient Instructions (Signed)
f/u in 3. month, call if you need me sooner  Fasting lipid, cmp and eGFR and hBA1c in  3 month   Please bring your meter tomorrow so we can check to see if it is working properly, I doubt it!  Pls test and record blood sugars three times daily and record  Goal for fasting blood sugar ranges from 90 to 130 and 2 hours after any meal or at bedtime should be between 140 to 180.     You are being referred to the hematology clinic to look at your new anemia, there is no hidden blood in your stool   Thank you  for choosing [facility]. We consider it a privelige to serve you.  Delivering excellent health care in a caring and  compassionate way is our goal.  Partnering with you,  so that together we can achieve this goal is our strategy.   It is important that you exercise regularly at least 30 minutes 5 times a week. If you develop chest pain, have severe difficulty breathing, or feel very tired, stop exercising immediately and seek medical attention

## 2017-01-12 ENCOUNTER — Telehealth: Payer: Self-pay | Admitting: Family Medicine

## 2017-01-12 DIAGNOSIS — D649 Anemia, unspecified: Secondary | ICD-10-CM | POA: Insufficient documentation

## 2017-01-12 DIAGNOSIS — Z1211 Encounter for screening for malignant neoplasm of colon: Secondary | ICD-10-CM | POA: Insufficient documentation

## 2017-01-12 NOTE — Assessment & Plan Note (Signed)
New onset anemia, unclear etiology. Denies any GI symptoms an stool is heme negative , also iron is normal, refer to hematology for eval

## 2017-01-12 NOTE — Assessment & Plan Note (Signed)
Lessened as spouse's health is improved

## 2017-01-12 NOTE — Assessment & Plan Note (Signed)
Deteriorated, checked meter is working, more car b counting and 3 times daily testing, may also benefit from diabetic  Re ed on car counting will refer f/u in 3 months. Daily exerecise commitment needed Kimberly Freeman is reminded of the importance of commitment to daily physical activity for 30 minutes or more, as able and the need to limit carbohydrate intake to 30 to 60 grams per meal to help with blood sugar control.   The need to take medication as prescribed, test blood sugar as directed, and to call between visits if there is a concern that blood sugar is uncontrolled is also discussed.   Kimberly Freeman is reminded of the importance of daily foot exam, annual eye examination, and good blood sugar, blood pressure and cholesterol control.  Diabetic Labs Latest Ref Rng & Units 01/02/2017 09/04/2016 04/10/2016 01/05/2016 12/15/2015  HbA1c <5.7 % 8.5(H) 7.6(H) 7.5(H) - -  Microalbumin <2.0 mg/dL - - - - -  Micro/Creat Ratio 0.0 - 30.0 mg/g - - - - -  Chol <200 mg/dL - 960162 454148 - -  HDL >09>50 mg/dL - 81(X50(L) 49 - -  Calc LDL <100 mg/dL - 86 75 - -  Triglycerides <150 mg/dL - 914130 782118 - -  Creatinine 0.60 - 0.93 mg/dL 9.56(O0.94(H) 1.300.89 8.65(H0.95(H) 8.46(N0.99(H) 1.14(H)   BP/Weight 01/08/2017 10/04/2016 08/27/2016 04/11/2016 01/05/2016 12/22/2015 12/15/2015  Systolic BP 140 140 124 120 124 140 117  Diastolic BP 70 70 72 70 76 80 76  Wt. (Lbs) 191 191.12 - 181 - 190.08 185.85  BMI 29.91 29.93 - 28.34 - 29.76 -   Foot/eye exam completion dates Latest Ref Rng & Units 04/11/2016 03/15/2015  Eye Exam No Retinopathy - -  Foot exam Order - - -  Foot Form Completion - Done Done

## 2017-01-12 NOTE — Telephone Encounter (Signed)
pls call pt by mid week, and ask how blood sugars are doing. Let me know if elevated. I strongly recommend diabetic ed also, pls refer if she agrees, I will signI

## 2017-01-12 NOTE — Assessment & Plan Note (Signed)
Hyperlipidemia:Low fat diet discussed and encouraged.   Lipid Panel  Lab Results  Component Value Date   CHOL 162 09/04/2016   HDL 50 (L) 09/04/2016   LDLCALC 86 09/04/2016   TRIG 130 09/04/2016   CHOLHDL 3.2 09/04/2016   Controlled, no change in medication Updated lab needed at/ before next visit.

## 2017-01-12 NOTE — Assessment & Plan Note (Signed)
New anemia, rectal exam : no mass and heme negative stool

## 2017-01-12 NOTE — Progress Notes (Signed)
Kimberly Freeman     MRN: [id number]      DOB: [birth year]   HPI Kimberly Freeman is here for follow up and re-evaluation of chronic medical conditions, medication management and review of any available recent lab and radiology data.  Preventive health is updated, specifically  Cancer screening and Immunization.   Questions or concerns regarding consultations or procedures which the PT has had in the interim are  addressed. The PT denies any adverse reactions to current medications since the last visit.  There are no new concerns.  Denies polyuria, polydipsia, blurred vision , or hypoglycemic episodes. Reports good fasting and bedtime blood sugars but her blood sugar average is elevated, Meter is checked in the office and is working. She will start three times daily testing  ROS Denies recent fever or chills. Denies sinus pressure, nasal congestion, ear pain or sore throat. Denies chest congestion, productive cough or wheezing. Denies chest pains, palpitations and leg swelling Denies abdominal pain, nausea, vomiting,diarrhea or constipation. Denies change in stool caliber  Denies dysuria, frequency, hesitancy or incontinence. Denies joint pain, swelling and limitation in mobility. Denies headaches, seizures, numbness, or tingling. Denies depression, anxiety or insomnia. Denies skin break down or rash.   PE  BP 140/70   Pulse 71   Resp 15   Ht 5\' 7"  (1.702 m)   Wt 191 lb (86.6 kg)   SpO2 99%   BMI 29.91 kg/m   Patient alert and oriented and in no cardiopulmonary distress.  HEENT: No facial asymmetry, EOMI,   oropharynx pink and moist.  Neck supple no JVD, no mass.  Chest: Clear to auscultation bilaterally.  CVS: S1, S2 no murmurs, no S3.Regular rate.  ABD: Soft non tender. No organomegaly or mass, normal BS Rectal : no mass, heme negative stool  Ext: No edema  MS: Adequate ROM spine, shoulders, hips and knees.  Skin: Intact, no ulcerations or rash noted.  Psych: Good eye  contact, normal affect. Memory intact not anxious or depressed appearing.  CNS: CN 2-12 intact, power,  normal throughout.no focal deficits noted.   Assessment & Plan  Diabetes mellitus, insulin dependent (IDDM), uncontrolled (HCC) Deteriorated, checked meter is working, more car b counting and 3 times daily testing, may also benefit from diabetic  Re ed on car counting will refer f/u in 3 months. Daily exerecise commitment needed Kimberly Freeman is reminded of the importance of commitment to daily physical activity for 30 minutes or more, as able and the need to limit carbohydrate intake to 30 to 60 grams per meal to help with blood sugar control.   The need to take medication as prescribed, test blood sugar as directed, and to call between visits if there is a concern that blood sugar is uncontrolled is also discussed.   Kimberly Freeman is reminded of the importance of daily foot exam, annual eye examination, and good blood sugar, blood pressure and cholesterol control.  Diabetic Labs Latest Ref Rng & Units 01/02/2017 09/04/2016 04/10/2016 01/05/2016 12/15/2015  HbA1c <5.7 % 8.5(H) 7.6(H) 7.5(H) - -  Microalbumin <2.0 mg/dL - - - - -  Micro/Creat Ratio 0.0 - 30.0 mg/g - - - - -  Chol <200 mg/dL - 409 811 - -  HDL >91 mg/dL - 47(W) 49 - -  Calc LDL <100 mg/dL - 86 75 - -  Triglycerides <150 mg/dL - 295 621 - -  Creatinine 0.60 - 0.93 mg/dL 3.08(M) 5.78 4.69(G) 2.95(M) 1.14(H)   BP/Weight 01/08/2017 10/04/2016 08/27/2016  04/11/2016 01/05/2016 12/22/2015 12/15/2015  Systolic BP 140 140 124 120 124 140 117  Diastolic BP 70 70 72 70 76 80 76  Wt. (Lbs) 191 191.12 - 181 - 190.08 185.85  BMI 29.91 29.93 - 28.34 - 29.76 -   Foot/eye exam completion dates Latest Ref Rng & Units 04/11/2016 03/15/2015  Eye Exam No Retinopathy - -  Foot exam Order - - -  Foot Form Completion - Done Done        Essential hypertension Sub optimal but adequate control, no med change DASH diet and commitment to daily  physical activity for a minimum of 30 minutes discussed and encouraged, as a part of hypertension management. The importance of attaining a healthy weight is also discussed.  BP/Weight 01/08/2017 10/04/2016 08/27/2016 04/11/2016 01/05/2016 12/22/2015 12/15/2015  Systolic BP 140 140 124 120 124 140 117  Diastolic BP 70 70 72 70 76 80 76  Wt. (Lbs) 191 191.12 - 181 - 190.08 185.85  BMI 29.91 29.93 - 28.34 - 29.76 -       Anemia New onset anemia, unclear etiology. Denies any GI symptoms an stool is heme negative , also iron is normal, refer to hematology for eval  Stress at home Lessened as spouse's health is improved  Hyperlipidemia LDL goal <100 Hyperlipidemia:Low fat diet discussed and encouraged.   Lipid Panel  Lab Results  Component Value Date   CHOL 162 09/04/2016   HDL 50 (L) 09/04/2016   LDLCALC 86 09/04/2016   TRIG 130 09/04/2016   CHOLHDL 3.2 09/04/2016   Controlled, no change in medication Updated lab needed at/ before next visit.

## 2017-01-12 NOTE — Assessment & Plan Note (Signed)
Sub optimal but adequate control, no med change DASH diet and commitment to daily physical activity for a minimum of 30 minutes discussed and encouraged, as a part of hypertension management. The importance of attaining a healthy weight is also discussed.  BP/Weight 01/08/2017 10/04/2016 08/27/2016 04/11/2016 01/05/2016 12/22/2015 12/15/2015  Systolic BP 140 140 124 120 124 140 117  Diastolic BP 70 70 72 70 76 80 76  Wt. (Lbs) 191 191.12 - 181 - 190.08 185.85  BMI 29.91 29.93 - 28.34 - 29.76 -

## 2017-01-23 ENCOUNTER — Other Ambulatory Visit: Payer: Self-pay

## 2017-01-23 MED ORDER — INSULIN PEN NEEDLE 30G X 8 MM MISC: 1.0000 | 5 refills | Status: DC

## 2017-01-23 NOTE — Telephone Encounter (Signed)
noted, thanks

## 2017-01-23 NOTE — Telephone Encounter (Signed)
She is doing much better checking three times a day and her numbers are much better and she is eating smaller portions and will continue

## 2017-01-29 DIAGNOSIS — E1142 Type 2 diabetes mellitus with diabetic polyneuropathy: Secondary | ICD-10-CM | POA: Diagnosis not present

## 2017-01-29 DIAGNOSIS — B351 Tinea unguium: Secondary | ICD-10-CM | POA: Diagnosis not present

## 2017-02-04 ENCOUNTER — Other Ambulatory Visit: Payer: Self-pay

## 2017-02-04 ENCOUNTER — Telehealth: Payer: Self-pay

## 2017-02-04 MED ORDER — BASAGLAR KWIKPEN 100 UNIT/ML ~~LOC~~ SOPN: 50.0000 [IU] | PEN_INJECTOR | Freq: Every day | SUBCUTANEOUS | 1 refills | Status: DC

## 2017-02-04 NOTE — Progress Notes (Signed)
basaglar

## 2017-02-04 NOTE — Telephone Encounter (Signed)
Lantus not covered and was recently changed to Levemir (pt had taken this once before and had a reaction to it) Attempted to take it again this side but started having reaction.Another covered alternative is basaglar. Wants to know if it can be changed to that one instead.

## 2017-02-04 NOTE — Telephone Encounter (Signed)
Basaglar should be equivalent to the Lantus, and if it is covered, prescribe at same dose as lantus.

## 2017-02-04 NOTE — Telephone Encounter (Signed)
Sent!

## 2017-02-13 ENCOUNTER — Encounter (HOSPITAL_COMMUNITY): Payer: Medicare Other | Attending: Oncology

## 2017-02-21 ENCOUNTER — Ambulatory Visit (HOSPITAL_COMMUNITY): Payer: Medicare Other | Admitting: Oncology

## 2017-03-05 ENCOUNTER — Other Ambulatory Visit: Payer: Self-pay

## 2017-03-05 MED ORDER — GLIPIZIDE ER 5 MG PO TB24: 5.0000 mg | ORAL_TABLET | Freq: Every day | ORAL | 1 refills | Status: DC

## 2017-03-05 MED ORDER — IRBESARTAN 300 MG PO TABS: 300.0000 mg | ORAL_TABLET | Freq: Every day | ORAL | 1 refills | Status: DC

## 2017-03-11 ENCOUNTER — Telehealth: Payer: Self-pay

## 2017-03-11 ENCOUNTER — Other Ambulatory Visit: Payer: Self-pay

## 2017-03-11 MED ORDER — GLIPIZIDE ER 5 MG PO TB24: 5.0000 mg | ORAL_TABLET | Freq: Every day | ORAL | 1 refills | Status: DC

## 2017-03-11 NOTE — Telephone Encounter (Signed)
Refill correctly sent in with note to deliver asap

## 2017-03-11 NOTE — Telephone Encounter (Signed)
Pt called and left message that [employer] needs more info in order to deliver her glipizide

## 2017-03-22 DIAGNOSIS — Z794 Long term (current) use of insulin: Secondary | ICD-10-CM | POA: Diagnosis not present

## 2017-03-22 DIAGNOSIS — E119 Type 2 diabetes mellitus without complications: Secondary | ICD-10-CM | POA: Diagnosis not present

## 2017-03-22 DIAGNOSIS — E785 Hyperlipidemia, unspecified: Secondary | ICD-10-CM | POA: Diagnosis not present

## 2017-03-22 LAB — COMPLETE METABOLIC PANEL WITH GFR
ALBUMIN: 4.1 g/dL (ref 3.6–5.1)
ALK PHOS: 72 U/L (ref 33–130)
ALT: 18 U/L (ref 6–29)
AST: 18 U/L (ref 10–35)
BUN: 20 mg/dL (ref 7–25)
CO2: 27 mmol/L (ref 20–31)
Calcium: 9.6 mg/dL (ref 8.6–10.4)
Chloride: 106 mmol/L (ref 98–110)
Creat: 0.98 mg/dL — ABNORMAL HIGH (ref 0.60–0.93)
GFR, Est African American: 64 mL/min (ref >=60)
GFR, Est Non African American: 56 mL/min — ABNORMAL LOW (ref >=60)
GLUCOSE: 74 mg/dL (ref 65–99)
Potassium: 4.4 mmol/L (ref 3.5–5.3)
SODIUM: 142 mmol/L (ref 135–146)
Total Bilirubin: 0.4 mg/dL (ref 0.2–1.2)
Total Protein: 6.7 g/dL (ref 6.1–8.1)

## 2017-03-22 LAB — LIPID PANEL
Cholesterol: 150 mg/dL (ref <200)
HDL: 51 mg/dL (ref >50)
LDL Cholesterol: 78 mg/dL (ref <100)
Total CHOL/HDL Ratio: 2.9 Ratio (ref <5.0)
Triglycerides: 104 mg/dL (ref <150)
VLDL: 21 mg/dL (ref <30)

## 2017-03-23 LAB — HEMOGLOBIN A1C
Hgb A1c MFr Bld: 7.9 % — ABNORMAL HIGH (ref <5.7)
Mean Plasma Glucose: 180 mg/dL

## 2017-03-25 ENCOUNTER — Ambulatory Visit (HOSPITAL_COMMUNITY)
Admission: RE | Admit: 2017-03-25 | Discharge: 2017-03-25 | Disposition: A | Discharge status: Home / Self Care | Payer: Medicare Other | Source: Ambulatory Visit | Attending: Family Medicine | Admitting: Family Medicine

## 2017-03-25 ENCOUNTER — Ambulatory Visit (INDEPENDENT_AMBULATORY_CARE_PROVIDER_SITE_OTHER): Payer: Medicare Other | Admitting: Family Medicine

## 2017-03-25 ENCOUNTER — Encounter: Payer: Self-pay | Admitting: Family Medicine

## 2017-03-25 VITALS — BP 138/70 | HR 79 | Resp 16 | Ht 67.0 in | Wt 192.0 lb

## 2017-03-25 DIAGNOSIS — E119 Type 2 diabetes mellitus without complications: Secondary | ICD-10-CM

## 2017-03-25 DIAGNOSIS — E139 Other specified diabetes mellitus without complications: Secondary | ICD-10-CM | POA: Diagnosis not present

## 2017-03-25 DIAGNOSIS — M4184 Other forms of scoliosis, thoracic region: Secondary | ICD-10-CM | POA: Insufficient documentation

## 2017-03-25 DIAGNOSIS — E785 Hyperlipidemia, unspecified: Secondary | ICD-10-CM | POA: Diagnosis not present

## 2017-03-25 DIAGNOSIS — I10 Essential (primary) hypertension: Secondary | ICD-10-CM

## 2017-03-25 DIAGNOSIS — M546 Pain in thoracic spine: Secondary | ICD-10-CM | POA: Diagnosis not present

## 2017-03-25 DIAGNOSIS — E1165 Type 2 diabetes mellitus with hyperglycemia: Secondary | ICD-10-CM

## 2017-03-25 DIAGNOSIS — M47894 Other spondylosis, thoracic region: Secondary | ICD-10-CM | POA: Insufficient documentation

## 2017-03-25 DIAGNOSIS — N2889 Other specified disorders of kidney and ureter: Secondary | ICD-10-CM | POA: Diagnosis not present

## 2017-03-25 DIAGNOSIS — Z794 Long term (current) use of insulin: Secondary | ICD-10-CM

## 2017-03-25 DIAGNOSIS — R0789 Other chest pain: Secondary | ICD-10-CM | POA: Diagnosis not present

## 2017-03-25 DIAGNOSIS — IMO0001 Reserved for inherently not codable concepts without codable children: Secondary | ICD-10-CM

## 2017-03-25 DIAGNOSIS — G8929 Other chronic pain: Secondary | ICD-10-CM | POA: Diagnosis not present

## 2017-03-25 DIAGNOSIS — I700 Atherosclerosis of aorta: Secondary | ICD-10-CM | POA: Insufficient documentation

## 2017-03-25 DIAGNOSIS — M4814 Ankylosing hyperostosis [Forestier], thoracic region: Secondary | ICD-10-CM | POA: Diagnosis not present

## 2017-03-25 DIAGNOSIS — R079 Chest pain, unspecified: Secondary | ICD-10-CM | POA: Insufficient documentation

## 2017-03-25 DIAGNOSIS — R109 Unspecified abdominal pain: Secondary | ICD-10-CM

## 2017-03-25 NOTE — Assessment & Plan Note (Addendum)
Thoracic spine pain x 5 weeks, worse with certain movements, x ray of spine to r/o pathologic fracture

## 2017-03-25 NOTE — Assessment & Plan Note (Addendum)
Daily x 1 month, on rigth side occurs when she lies down and also when she first gets up from lying down duration less than 5 min, [place] of affected area to evaluate for mass

## 2017-03-25 NOTE — Assessment & Plan Note (Signed)
Improveed. Kimberly Freeman is reminded of the importance of commitment to daily physical activity for 30 minutes or more, as able and the need to limit carbohydrate intake to 30 to 60 grams per meal to help with blood sugar control.   The need to take medication as prescribed, test blood sugar as directed, and to call between visits if there is a concern that blood sugar is uncontrolled is also discussed.   Kimberly Freeman is reminded of the importance of daily foot exam, annual eye examination, and good blood sugar, blood pressure and cholesterol control.  Diabetic Labs Latest Ref Rng & Units 03/22/2017 01/02/2017 09/04/2016 04/10/2016 01/05/2016  HbA1c <5.7 % 7.9(H) 8.5(H) 7.6(H) 7.5(H) -  Microalbumin <2.0 mg/dL - - - - -  Micro/Creat Ratio 0.0 - 30.0 mg/g - - - - -  Chol <200 mg/dL 161150 - 096162 045148 -  HDL >40>50 mg/dL 51 - 98(J50(L) 49 -  Calc LDL <100 mg/dL 78 - 86 75 -  Triglycerides <150 mg/dL 191104 - 478130 295118 -  Creatinine 0.60 - 0.93 mg/dL 6.21(H0.98(H) 0.86(V0.94(H) 7.840.89 6.96(E0.95(H) 0.99(H)   BP/Weight 03/25/2017 01/08/2017 10/04/2016 08/27/2016 04/11/2016 01/05/2016 12/22/2015  Systolic BP 138 140 140 124 120 124 140  Diastolic BP 70 70 70 72 70 76 80  Wt. (Lbs) 192 191 191.12 - 181 - 190.08  BMI 30.07 29.91 29.93 - 28.34 - 29.76   Foot/eye exam completion dates Latest Ref Rng & Units 04/11/2016 03/15/2015  Eye Exam No Retinopathy - -  Foot exam Order - - -  Foot Form Completion - Done Done   Needs eye exam, will refer

## 2017-03-25 NOTE — Patient Instructions (Addendum)
F/u in 3.5 months, call if you need me before  Congrats on imnproved blood sugar, keep it up   For new right flank pain, Xray of thoracic spine and chest today, I am ordering and [place] of your kidneys also  I believe the pain is musculoskeletal, as occurs with certain movements for a short time only  I recommend tylenol if needed, and will likely refer you for physical therapy twice weekly for next 4 weeks  You are referred for eye exam  Non fasting HBA1c and chem 7 and EGFR in 3.5 months  Thank you  for choosing [facility]. We consider it a privelige to serve you.  Delivering excellent health care in a caring and  compassionate way is our goal.  Partnering with you,  so that together we can achieve this goal is our strategy.

## 2017-03-26 ENCOUNTER — Other Ambulatory Visit: Payer: Self-pay | Admitting: Family Medicine

## 2017-03-26 MED ORDER — TRAMADOL HCL 50 MG PO TABS: ORAL_TABLET | ORAL | 0 refills | Status: DC

## 2017-03-28 ENCOUNTER — Other Ambulatory Visit: Payer: Self-pay

## 2017-03-28 DIAGNOSIS — R109 Unspecified abdominal pain: Secondary | ICD-10-CM

## 2017-03-29 ENCOUNTER — Encounter: Payer: Self-pay | Admitting: Family Medicine

## 2017-03-29 NOTE — Assessment & Plan Note (Signed)
Hyperlipidemia:Low fat diet discussed and encouraged.   Lipid Panel  Lab Results  Component Value Date   CHOL 150 03/22/2017   HDL 51 03/22/2017   LDLCALC 78 03/22/2017   TRIG 104 03/22/2017   CHOLHDL 2.9 03/22/2017   Controlled, no change in medication

## 2017-03-29 NOTE — Progress Notes (Signed)
CORTNY BAMBACH     MRN: [id number]      DOB: 07/14/1941   HPI Kimberly Freeman is here for follow up and re-evaluation of chronic medical conditions, medication management and review of any available recent lab and radiology data.  Preventive health is updated, specifically  Cancer screening and Immunization.   Questions or concerns regarding consultations or procedures which the PT has had in the interim are  addressed. The PT denies any adverse reactions to current medications since the last visit.  Denies polyuria, polydipsia, blurred vision , or hypoglycemic episodes. 5 week h/o right flank pain daily, worse with certain movements   ROS Denies recent fever or chills. Denies sinus pressure, nasal congestion, ear pain or sore throat. Denies chest congestion, productive cough or wheezing. Denies chest pains, palpitations and leg swelling Denies abdominal pain, nausea, vomiting,diarrhea or constipation.   Denies dysuria, frequency, hesitancy or incontinence.  Denies headaches, seizures, numbness, or tingling. Denies depression, anxiety or insomnia. Denies skin break down or rash.   PE  BP 138/70   Pulse 79   Resp 16   Ht 5\' 7"  (1.702 m)   Wt 192 lb (87.1 kg)   SpO2 96%   BMI 30.07 kg/m   Patient alert and oriented and in no cardiopulmonary distress.  HEENT: No facial asymmetry, EOMI,   oropharynx pink and moist.  Neck supple no JVD, no mass.  Chest: Clear to auscultation bilaterally.  CVS: S1, S2 no murmurs, no S3.Regular rate.  ABD: Soft  Right renal angle tender and probable mass, otherwise no palpable organomegaly or mass, normal BS Ext: No edema  MS: decreased  ROM spine, normal in  shoulders, hips and knees.  Skin: Intact, no ulcerations or rash noted.  Psych: Good eye contact, normal affect. Memory intact not anxious or depressed appearing.  CNS: CN 2-12 intact, power,  normal throughout.no focal deficits noted.   Assessment & Plan  Diabetes mellitus,  insulin dependent (IDDM), uncontrolled (HCC) Improveed. Kimberly Freeman is reminded of the importance of commitment to daily physical activity for 30 minutes or more, as able and the need to limit carbohydrate intake to 30 to 60 grams per meal to help with blood sugar control.   The need to take medication as prescribed, test blood sugar as directed, and to call between visits if there is a concern that blood sugar is uncontrolled is also discussed.   Kimberly Freeman is reminded of the importance of daily foot exam, annual eye examination, and good blood sugar, blood pressure and cholesterol control.  Diabetic Labs Latest Ref Rng & Units 03/22/2017 01/02/2017 09/04/2016 04/10/2016 01/05/2016  HbA1c <5.7 % 7.9(H) 8.5(H) 7.6(H) 7.5(H) -  Microalbumin <2.0 mg/dL - - - - -  Micro/Creat Ratio 0.0 - 30.0 mg/g - - - - -  Chol <200 mg/dL 782 - 956 213 -  HDL >08 mg/dL 51 - 65(H) 49 -  Calc LDL <100 mg/dL 78 - 86 75 -  Triglycerides <150 mg/dL 846 - 962 952 -  Creatinine 0.60 - 0.93 mg/dL 8.41(L) 2.44(W) 1.02 7.25(D) 0.99(H)   BP/Weight 03/25/2017 01/08/2017 10/04/2016 08/27/2016 04/11/2016 01/05/2016 12/22/2015  Systolic BP 138 140 140 124 120 124 140  Diastolic BP 70 70 70 72 70 76 80  Wt. (Lbs) 192 191 191.12 - 181 - 190.08  BMI 30.07 29.91 29.93 - 28.34 - 29.76   Foot/eye exam completion dates Latest Ref Rng & Units 04/11/2016 03/15/2015  Eye Exam No Retinopathy - -  Foot  exam Order - - -  Foot Form Completion - Done Done   Needs eye exam, will refer     Chronic flank pain Daily x 1 month, on rigth side occurs when she lies down and also when she first gets up from lying down duration less than 5 min, Koreau[place] of affected area to evaluate for mass  Thoracic spine pain Thoracic spine pain x 5 weeks, worse with certain movements, x ray of spine to r/o pathologic fracture   Essential hypertension DASH diet and commitment to daily physical activity for a minimum of 30 minutes discussed and encouraged, as a part  of hypertension management. The importance of attaining a healthy weight is also discussed.  BP/Weight 03/25/2017 01/08/2017 10/04/2016 08/27/2016 04/11/2016 01/05/2016 12/22/2015  Systolic BP 138 140 140 124 120 124 140  Diastolic BP 70 70 70 72 70 76 80  Wt. (Lbs) 192 191 191.12 - 181 - 190.08  BMI 30.07 29.91 29.93 - 28.34 - 29.76       Hyperlipidemia LDL goal <100 Hyperlipidemia:Low fat diet discussed and encouraged.   Lipid Panel  Lab Results  Component Value Date   CHOL 150 03/22/2017   HDL 51 03/22/2017   LDLCALC 78 03/22/2017   TRIG 104 03/22/2017   CHOLHDL 2.9 03/22/2017   Controlled, no change in medication

## 2017-03-29 NOTE — Assessment & Plan Note (Signed)
DASH diet and commitment to daily physical activity for a minimum of 30 minutes discussed and encouraged, as a part of hypertension management. The importance of attaining a healthy weight is also discussed.  BP/Weight 03/25/2017 01/08/2017 10/04/2016 08/27/2016 04/11/2016 01/05/2016 12/22/2015  Systolic BP 138 140 140 124 120 124 140  Diastolic BP 70 70 70 72 70 76 80  Wt. (Lbs) 192 191 191.12 - 181 - 190.08  BMI 30.07 29.91 29.93 - 28.34 - 29.76

## 2017-04-02 ENCOUNTER — Encounter (HOSPITAL_COMMUNITY): Payer: Medicare Other | Attending: Oncology | Admitting: Oncology

## 2017-04-02 ENCOUNTER — Encounter (HOSPITAL_COMMUNITY): Payer: Self-pay | Admitting: Oncology

## 2017-04-02 ENCOUNTER — Encounter (HOSPITAL_COMMUNITY): Payer: Medicare Other

## 2017-04-02 VITALS — BP 155/66 | HR 72 | Temp 98.7°F | Resp 16 | Ht 67.0 in | Wt 191.0 lb

## 2017-04-02 DIAGNOSIS — Z8042 Family history of malignant neoplasm of prostate: Secondary | ICD-10-CM | POA: Diagnosis not present

## 2017-04-02 DIAGNOSIS — Z801 Family history of malignant neoplasm of trachea, bronchus and lung: Secondary | ICD-10-CM

## 2017-04-02 DIAGNOSIS — D649 Anemia, unspecified: Secondary | ICD-10-CM | POA: Diagnosis not present

## 2017-04-02 DIAGNOSIS — E119 Type 2 diabetes mellitus without complications: Secondary | ICD-10-CM

## 2017-04-02 DIAGNOSIS — Z806 Family history of leukemia: Secondary | ICD-10-CM | POA: Diagnosis not present

## 2017-04-02 DIAGNOSIS — Z809 Family history of malignant neoplasm, unspecified: Secondary | ICD-10-CM | POA: Diagnosis not present

## 2017-04-02 LAB — CBC WITH DIFFERENTIAL/PLATELET
BASOS PCT: 1 %
Basophils Absolute: 0 10*3/uL (ref 0.0–0.1)
EOS ABS: 0.2 10*3/uL (ref 0.0–0.7)
Eosinophils Relative: 4 %
HEMATOCRIT: 34.3 % — AB (ref 36.0–46.0)
Hemoglobin: 10.9 g/dL — ABNORMAL LOW (ref 12.0–15.0)
LYMPHS ABS: 1.6 10*3/uL (ref 0.7–4.0)
Lymphocytes Relative: 29 %
MCH: 29.4 pg (ref 26.0–34.0)
MCHC: 31.8 g/dL (ref 30.0–36.0)
MCV: 92.5 fL (ref 78.0–100.0)
MONO ABS: 0.5 10*3/uL (ref 0.1–1.0)
MONOS PCT: 8 %
Neutro Abs: 3.2 10*3/uL (ref 1.7–7.7)
Neutrophils Relative %: 58 %
Platelets: 270 10*3/uL (ref 150–400)
RBC: 3.71 MIL/uL — ABNORMAL LOW (ref 3.87–5.11)
RDW: 12.9 % (ref 11.5–15.5)
WBC: 5.5 10*3/uL (ref 4.0–10.5)

## 2017-04-02 LAB — RETICULOCYTES
RBC.: 3.71 MIL/uL — AB (ref 3.87–5.11)
RETIC COUNT ABSOLUTE: 66.8 10*3/uL (ref 19.0–186.0)
Retic Ct Pct: 1.8 % (ref 0.4–3.1)

## 2017-04-02 LAB — COMPREHENSIVE METABOLIC PANEL
ALBUMIN: 3.6 g/dL (ref 3.5–5.0)
ALT: 33 U/L (ref 14–54)
ANION GAP: 8 (ref 5–15)
AST: 31 U/L (ref 15–41)
Alkaline Phosphatase: 78 U/L (ref 38–126)
BILIRUBIN TOTAL: 0.5 mg/dL (ref 0.3–1.2)
BUN: 16 mg/dL (ref 6–20)
CALCIUM: 9.3 mg/dL (ref 8.9–10.3)
CO2: 27 mmol/L (ref 22–32)
Chloride: 103 mmol/L (ref 101–111)
Creatinine, Ser: 1.01 mg/dL — ABNORMAL HIGH (ref 0.44–1.00)
GFR calc non Af Amer: 52 mL/min — ABNORMAL LOW (ref >60)
GLUCOSE: 188 mg/dL — AB (ref 65–99)
POTASSIUM: 4 mmol/L (ref 3.5–5.1)
SODIUM: 138 mmol/L (ref 135–145)
TOTAL PROTEIN: 7.3 g/dL (ref 6.5–8.1)

## 2017-04-02 LAB — FOLATE: FOLATE: 66.9 ng/mL (ref >5.9)

## 2017-04-02 LAB — IRON AND TIBC
IRON: 47 ug/dL (ref 28–170)
Saturation Ratios: 15 % (ref 10.4–31.8)
TIBC: 318 ug/dL (ref 250–450)
UIBC: 271 ug/dL

## 2017-04-02 LAB — VITAMIN B12: Vitamin B-12: 346 pg/mL (ref 180–914)

## 2017-04-02 LAB — FERRITIN: Ferritin: 120 ng/mL (ref 11–307)

## 2017-04-02 NOTE — Progress Notes (Signed)
Cancer Center @ [facility] [phone]) [phone]  Fax:([phone]   INITIAL CONSULT  Kimberly Freeman OB: 02-23-1941  MR#: [id number]  AOZ#:[id number]#:[id number]  Patient Care Team: Kerri PerchesSimpson, Margaret E, MD as PCP - General Laqueta LindenKoneswaran, Suresh A, MD as Attending Physician (Cardiology) Ferman HammingMcKinney, Benjamin, DPM as Consulting Physician (Podiatry)  CHIEF COMPLAINT: This 76 year old lady was referred to cancer Center for further evaluation regarding anemia. Hemoglobin is recorded in March of 2018 was 10.7.  (Previous hemoglobin in February of 2017 was 12.1) Anemia is normocytic normochromic.  White count and platelet count was within normal limits. Patient had stool for occult blood in March was negative. Iron iron-binding capacity in March of 2018 and negative. At colonoscopy done approximately 13 years or before Patient denies any history of GI bleeding.  Or hematuria.  Had left renal pain, and ultrasound of kidney has been planned which is going to be done tomorrow.  VISIT DIAGNOSIS:  Normocytic normochromic anemia   No history exists.    Oncology Flowsheet 03/31/2012 05/14/2012 07/17/2013 10/12/2014 12/13/2015 12/14/2015 04/11/2016  diphenhydrAMINE (BENADRYL) PO - - - - - - -  enoxaparin (LOVENOX) Tennyson - - - - 40 mg 40 mg -  methylPREDNISolone acetate (DEPO-MEDROL) IM 80 mg - 80 mg 80 mg - - 80 mg  ondansetron (ZOFRAN) IM - 4 mg - - - - -      REVIEW OF SYSTEMS:   GENERAL:  Feels good.  Active.  No fevers, sweats or weight loss. PERFORMANCE STATUS (ECOG):  0 HEENT:  No visual changes, runny nose, sore throat, mouth sores or tenderness. Lungs: No shortness of breath or cough.  No hemoptysis. Cardiac:  No chest pain, palpitations, orthopnea, or PND. GI:  No nausea, vomiting, diarrhea, constipation, melena or hematochezia. GU:  No urgency, frequency, dysuria, or hematuria. Musculoskeletal:  No back pain.  No joint pain.  No muscle tenderness. Extremities:  No pain or swelling. Skin:  No rashes or skin  changes. Neuro:  No headache, numbness or weakness, balance or coordination issues. Endocrine:  No diabetes, thyroid issues, hot flashes or night sweats. Psych:  No mood changes, depression or anxiety. Pain:  No focal pain. Review of systems:  All other systems reviewed and found to be negative.  As per HPI. Otherwise, a complete review of systems is negatve.  PAST MEDICAL HISTORY: Past Medical History:  Diagnosis Date  . Diabetes mellitus, type 2 (HCC)   . Essential hypertension   . GERD (gastroesophageal reflux disease)   . Hyperlipidemia   . Obesity     PAST SURGICAL HISTORY: Past Surgical History:  Procedure Laterality Date  . ABDOMINAL HYSTERECTOMY    . CHOLECYSTECTOMY  2004    FAMILY HISTORY Family History  Problem Relation Age of Onset  . Leukemia Mother   . Cancer Mother   . Prostate cancer Father   . Lung cancer Sister [id number]  . Diabetes Sister     GYNECOLOGIC HISTORY:  No LMP recorded. Patient has had a hysterectomy.     ADVANCED DIRECTIVES:    HEALTH MAINTENANCE: Social History  Substance Use Topics  . Smoking status: Never Smoker  . Smokeless tobacco: Never Used  . Alcohol use No     Colonoscopy:Several years ago    Allergies  Allergen Reactions  . Poison Oak Extract [Poison Oak Extract] Hives  . Sulfonamide Derivatives Itching    Burning sensation all over body    Current Outpatient Prescriptions  Medication Sig Dispense Refill  . acetaminophen (TYLENOL) 500 MG tablet  Take 500 mg by mouth as needed. For pain    . aspirin 325 MG tablet Take 325 mg by mouth every morning.    Marland Kitchen atorvastatin (LIPITOR) 40 MG tablet TAKE 1 TABLET BY MOUTH AT BEDTIME 90 tablet 1  . glipiZIDE (GLUCOTROL XL) 5 MG 24 hr tablet Take 1 tablet (5 mg total) by mouth daily. 90 tablet 1  . glucose blood test strip (Onetouch Ultrablue ) Use as instructed for twice daily testing 300 each 1  . Insulin Glargine (BASAGLAR KWIKPEN) 100 UNIT/ML SOPN Inject 0.5 mLs (50 Units total)  into the skin at bedtime. 45 mL 1  . Insulin Pen Needle (NOVOFINE) 30G X 8 MM MISC Inject 10 each into the skin as directed. 100 each 5  . irbesartan (AVAPRO) 300 MG tablet Take 1 tablet (300 mg total) by mouth daily. 90 tablet 1  . Lancets (ONETOUCH ULTRASOFT) lancets USE AS DIRECTED TWICE DAILY 200 each 1  . metFORMIN (GLUCOPHAGE-XR) 500 MG 24 hr tablet TAKE 2 TABLETS BY MOUTH TWICE DAILY 360 tablet 1  . metoprolol (LOPRESSOR) 50 MG tablet Take 1 tablet (50 mg total) by mouth 2 (two) times daily. 180 tablet 3  . Multiple Vitamin (MULTIVITAMIN WITH MINERALS) TABS tablet Take 1 tablet by mouth daily.    . nitroGLYCERIN (NITROSTAT) 0.4 MG SL tablet Place 1 tablet (0.4 mg total) under the tongue every 5 (five) minutes as needed for chest pain. 30 tablet o  . pantoprazole (PROTONIX) 40 MG tablet Take 1 tablet (40 mg total) by mouth daily. 90 tablet 1  . penicillin v potassium (VEETID) 500 MG tablet     . spironolactone (ALDACTONE) 25 MG tablet TAKE 1 TABLET BY MOUTH DAILY 90 tablet 1  . traMADol (ULTRAM) 50 MG tablet One tablet  at bedtime as needed, for pain 30 tablet 0   No current facility-administered medications for this visit.     OBJECTIVE: PHYSICAL EXAM: General  status: Performance status is good.  Patient has not lost significant weight. Since last evaluation there is no significant change in the general status HEENT: No evidence of stomatitis. Sclera and conjunctivae :: No jaundice.   pale looking. Lungs: Air  entry equal on both sides.  No rhonchi.  No rales.  Cardiac: Heart sounds are normal.  No pericardial rub.  No murmur. Lymphatic system: Cervical, axillary, inguinal, lymph nodes not palpable GI: Abdomen is soft.liver and spleen not palpable.  No ascites.  Bowel sounds are normal.  No other palpable masses.  No tenderness . Lower extremity: No edema Neurological system: Higher functions, cranial nerves intact no evidence of peripheral neuropathy. Skin: No rash.  No  ecchymosis.. No petechial hemorrhages Breast was not examined  Vitals:   04/02/17 0834  BP: (!) 155/66  Pulse: 72  Resp: 16  Temp: 98.7 F (37.1 C)     Body mass index is 29.91 kg/m.    ECOG FS:0 - Asymptomatic  LAB RESULTS:  No visits with results within 5 Day(s) from this visit.  Latest known visit with results is:  Office Visit on 01/08/2017  Component Date Value Ref Range Status  . Cholesterol 03/22/2017 150  <200 mg/dL Final  . Triglycerides 03/22/2017 104  <150 mg/dL Final  . HDL 16/07/9603 51  >50 mg/dL Final  . Total CHOL/HDL Ratio 03/22/2017 2.9  <5.4 Ratio Final  . VLDL 03/22/2017 21  <30 mg/dL Final  . LDL Cholesterol 03/22/2017 78  <100 mg/dL Final  . Sodium 09/81/1914 142  135 -  146 mmol/L Final  . Potassium 03/22/2017 4.4  3.5 - 5.3 mmol/L Final  . Chloride 03/22/2017 106  98 - 110 mmol/L Final  . CO2 03/22/2017 27  20 - 31 mmol/L Final  . Glucose, Bld 03/22/2017 74  65 - 99 mg/dL Final  . BUN 16/07/9603 20  7 - 25 mg/dL Final  . Creat 54/06/8118 0.98* 0.60 - 0.93 mg/dL Final   Comment:   For patients > or = 78 years of age: The upper reference limit for Creatinine is approximately 13% higher for people identified as African-American.     . Total Bilirubin 03/22/2017 0.4  0.2 - 1.2 mg/dL Final  . Alkaline Phosphatase 03/22/2017 72  33 - 130 U/L Final  . AST 03/22/2017 18  10 - 35 U/L Final  . ALT 03/22/2017 18  6 - 29 U/L Final  . Total Protein 03/22/2017 6.7  6.1 - 8.1 g/dL Final  . Albumin 14/78/2956 4.1  3.6 - 5.1 g/dL Final  . Calcium 21/30/8657 9.6  8.6 - 10.4 mg/dL Final  . GFR, Est African American 03/22/2017 64  >=60 mL/min Final  . GFR, Est Non African American 03/22/2017 56* >=60 mL/min Final  . Hgb A1c MFr Bld 03/22/2017 7.9* <5.7 % Final   Comment:   For someone without known diabetes, a hemoglobin A1c value of 6.5% or greater indicates that they may have diabetes and this should be confirmed with a follow-up test.   For someone with  known diabetes, a value <7% indicates that their diabetes is well controlled and a value greater than or equal to 7% indicates suboptimal control. A1c targets should be individualized based on duration of diabetes, age, comorbid conditions, and other considerations.   Currently, no consensus exists for use of hemoglobin A1c for diagnosis of diabetes for children.     . Mean Plasma Glucose 03/22/2017 180  mg/dL Final  . Card #1 Date 84/69/6295 01/08/2017   Final  . Fecal Occult Blood, POC 01/08/2017 Negative  Negative Final   28413 5r 8/20 dev 24401U 5/21     STUDIES: Dg Chest 2 View  Result Date: 03/25/2017 CLINICAL DATA:  Right axillary discomfort for the past month with increasing severity over the past 2 weeks. Symptoms occur at night when patient is supine. No known injury. EXAM: CHEST  2 VIEW COMPARISON:  Chest x-ray of December 12, 2016 and thoracic spine series of March 25, 2017. FINDINGS: The lungs are well-expanded and clear. The heart and pulmonary vascularity are normal. There is calcification in the wall of the aortic arch. There is no pleural effusion. There is mild multilevel degenerative disc disease of the thoracic spine. IMPRESSION: There is no acute cardiopulmonary abnormality. Thoracic aortic atherosclerosis. Electronically Signed   By: David  [place] M.D.   On: 03/25/2017 11:13   Dg Thoracic Spine W/swimmers  Result Date: 03/25/2017 CLINICAL DATA:  Dorsalgia EXAM: THORACIC SPINE - 3 VIEWS COMPARISON:  Chest radiograph December 13, 2015 FINDINGS: Frontal, lateral, and swimmer's views were obtained. There is mild lower thoracic levoscoliosis. There is no fracture or spondylolisthesis. There is osteoarthritic change at multiple levels in the thoracic spine with disc space narrowing at multiple levels as well as multiple anterior and right lateral osteophytes consistent with a degree of superimposed diffuse idiopathic skeletal hyperostosis. No erosive change or paraspinous lesion.  There is aortic atherosclerosis. IMPRESSION: Slight scoliosis. 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 Areas of aortic atherosclerosis noted. Electronically Signed   By: Chrissie Noa  Margarita Grizzle III M.D.   On: 03/25/2017 11:13    ASSESSMENT:  Normocytic normochromic anemia Insulin-dependent diabetes   PLAN:  Reevaluate CBC, and iron-binding capacity.  Reticulocyte count.  Vitamin B12 and folic acid Wait for ultrasound of kidneys (ordered by primary care physician) Patient will be reevaluated in 2-3 weeks to see whether any further workup needed for mild anemia Patient also has been referred to gastroenterologist for routine screening colonoscopy  Patient expressed understanding and was in agreement with this plan. She also understands that She can call clinic at any time with any questions, concerns, or complaints.    Cancer Staging No matching staging information was found for the patient.  Johney Maine, MD   04/02/2017 8:53 AM

## 2017-04-02 NOTE — Patient Instructions (Addendum)
[facility] at [facility] Discharge Instructions  RECOMMENDATIONS MADE BY THE CONSULTANT AND ANY TEST RESULTS WILL BE SENT TO YOUR REFERRING PHYSICIAN.  You were seen today by Dr. Doylene Canninghoksi. Labs today, we will call you with those results. Return in 3 weeks for follow up.   Thank you for choosing [facility] at [facility] to provide your oncology and hematology care.  To afford each patient quality time with our provider, please arrive at least 15 minutes before your scheduled appointment time.    If you have a lab appointment with the Cancer Center please come in thru the  Main Entrance and check in at the main information desk  You need to re-schedule your appointment should you arrive 10 or more minutes late.  We strive to give you quality time with our providers, and arriving late affects you and other patients whose appointments are after yours.  Also, if you no show three or more times for appointments you may be dismissed from the clinic at the providers discretion.     Again, thank you for choosing [facility].  Our hope is that these requests will decrease the amount of time that you wait before being seen by our physicians.       _____________________________________________________________  Should you have questions after your visit to [facility], please contact our office at [phone] between the hours of 8:30 a.m. and 4:30 p.m.  Voicemails left after 4:30 p.m. will not be returned until the following business day.  For prescription refill requests, have your pharmacy contact our office.       Resources For Cancer Patients and their Caregivers ? American Cancer Society: Can assist with transportation, wigs, general needs, runs Look Good Feel Better.        ([phone] ? Cancer Care: Provides financial assistance, online support groups, medication/co-pay assistance.  1-800-813-HOPE 9075070168(4673) ? Marijean NiemannBarry  [facility] Assists [place] cancer patients and their families through emotional , educational and financial support.  [phone] ? [place] DSS Where to apply for food stamps, Medicaid and utility assistance. 3393298863(970)539-1663 ? [facility]: Transportation to medical appointments. ([phone] ? Social Security Administration: May apply for disability if have a Stage IV cancer. [phone] 207 [phone] ? [place] [facility], [employer] and [place] Services: Assists with nutrition, care and transit needs. [phone]  Cancer Center Support Programs: @10RELATIVEDAYS @ > Cancer Support Group  2nd Tuesday of the month 1pm-2pm, Journey Room  > Creative Journey  3rd Tuesday of the month 1130am-1pm, Journey Room  > Look Good Feel Better  1st Wednesday of the month 10am-12 noon, Journey Room (Call American Cancer Society to register [phone])

## 2017-04-03 ENCOUNTER — Ambulatory Visit (HOSPITAL_COMMUNITY)
Admission: RE | Admit: 2017-04-03 | Discharge: 2017-04-03 | Disposition: A | Discharge status: Home / Self Care | Payer: Medicare Other | Source: Ambulatory Visit | Attending: Family Medicine | Admitting: Family Medicine

## 2017-04-03 DIAGNOSIS — N2889 Other specified disorders of kidney and ureter: Secondary | ICD-10-CM | POA: Insufficient documentation

## 2017-04-04 ENCOUNTER — Other Ambulatory Visit: Payer: Self-pay | Admitting: Family Medicine

## 2017-04-04 ENCOUNTER — Other Ambulatory Visit: Payer: Self-pay

## 2017-04-04 DIAGNOSIS — N2889 Other specified disorders of kidney and ureter: Secondary | ICD-10-CM

## 2017-04-10 ENCOUNTER — Encounter: Payer: Self-pay | Admitting: Gastroenterology

## 2017-04-16 DIAGNOSIS — B351 Tinea unguium: Secondary | ICD-10-CM | POA: Diagnosis not present

## 2017-04-16 DIAGNOSIS — E1142 Type 2 diabetes mellitus with diabetic polyneuropathy: Secondary | ICD-10-CM | POA: Diagnosis not present

## 2017-04-18 ENCOUNTER — Ambulatory Visit (HOSPITAL_COMMUNITY)
Admission: RE | Admit: 2017-04-18 | Discharge: 2017-04-18 | Disposition: A | Discharge status: Home / Self Care | Payer: Medicare Other | Source: Ambulatory Visit | Attending: Family Medicine | Admitting: Family Medicine

## 2017-04-18 DIAGNOSIS — N281 Cyst of kidney, acquired: Secondary | ICD-10-CM | POA: Insufficient documentation

## 2017-04-18 DIAGNOSIS — N2889 Other specified disorders of kidney and ureter: Secondary | ICD-10-CM

## 2017-04-18 MED ORDER — GADOBENATE DIMEGLUMINE 529 MG/ML IV SOLN
20.0000 mL | Freq: Once | INTRAVENOUS | Status: AC | PRN
  Administered 2017-04-18: 18 mL via INTRAVENOUS

## 2017-04-22 ENCOUNTER — Encounter (HOSPITAL_COMMUNITY): Payer: Medicare Other | Attending: Oncology | Admitting: Oncology

## 2017-04-22 ENCOUNTER — Encounter (HOSPITAL_COMMUNITY): Payer: Self-pay

## 2017-04-22 VITALS — BP 149/59 | HR 74 | Temp 98.0°F | Resp 16 | Wt 191.0 lb

## 2017-04-22 DIAGNOSIS — D649 Anemia, unspecified: Secondary | ICD-10-CM | POA: Diagnosis not present

## 2017-04-22 NOTE — Progress Notes (Signed)
[facility] Follow up:    Fayrene Helper, MD [address], [address] [place] [address]   DIAGNOSIS: Normocytic anemia   INTERVAL HISTORY: Kimberly Freeman 76 y.o. female returns for evaluation of normocytic anemia. Anemia workup performed on her last visit on 04/03/17 demonstrated a hemoglobin of 10.9 g/dl, normal WBC and platelets. Folate, vitamin B12, and iron studies were all WNL. Overall patient states that she feels well and she denies any fatigue, chest pain, shortness of breath, weakness, abdominal pain, bone pain.   Patient Active Problem List   Diagnosis Date Noted  . Chronic flank pain 03/25/2017  . Thoracic spine pain 03/25/2017  . Anemia 01/12/2017  . Sciatica, left side 04/11/2016  . Esophageal reflux   . Palpitations 12/13/2015  . Ganglion cyst of wrist 12/05/2015  . Overweight (BMI 25.0-29.9) 04/22/2015  . Nonspecific abnormal electrocardiogram (ECG) (EKG) 10/18/2014  . Stress at home 10/25/2013  . Arthritis 05/25/2011  . Diabetes mellitus, insulin dependent (IDDM), uncontrolled ([place]) 04/21/2008  . Hyperlipidemia LDL goal <100 04/21/2008  . Essential hypertension 04/21/2008    is allergic to poison oak extract [poison oak extract] and sulfonamide derivatives.  MEDICAL HISTORY: Past Medical History:  Diagnosis Date  . Diabetes mellitus, type 2 ([place])   . Essential hypertension   . GERD (gastroesophageal reflux disease)   . Hyperlipidemia   . Obesity     SURGICAL HISTORY: Past Surgical History:  Procedure Laterality Date  . ABDOMINAL HYSTERECTOMY    . CHOLECYSTECTOMY  2004    SOCIAL HISTORY: Social History   Social History  . Marital status: Married    Spouse name: N/A  . Number of children: N/A  . Years of education: N/A   Occupational History  . Not on file.   Social History Main Topics  . Smoking status: Never Smoker  . Smokeless tobacco: Never Used  . Alcohol use No  . Drug use: No  . Sexual activity:  Not Currently    Partners: Male   Other Topics Concern  . Not on file   Social History Narrative  . No narrative on file    FAMILY HISTORY: Family History  Problem Relation Age of Onset  . Leukemia Mother   . Cancer Mother   . Prostate cancer Father   . Lung cancer Sister 83  . Diabetes Sister     Review of Systems  All other systems reviewed and are negative.     PHYSICAL EXAMINATION  ECOG PERFORMANCE STATUS: 0 - Asymptomatic  Vitals:   04/22/17 1018  BP: (!) 149/59  Pulse: 74  Resp: 16  Temp: 98 F (36.7 C)    Physical Exam  Constitutional: She is oriented to person, place, and time and well-developed, well-nourished, and in no distress. No distress.  HENT:  Head: Normocephalic and atraumatic.  Mouth/Throat: No oropharyngeal exudate.  Eyes: Conjunctivae are normal. Pupils are equal, round, and reactive to light. No scleral icterus.  Neck: Normal range of motion. Neck supple. No JVD present.  Cardiovascular: Normal rate, regular rhythm and normal heart sounds.  Exam reveals no gallop and no friction rub.   No murmur heard. Pulmonary/Chest: Breath sounds normal. No respiratory distress. She has no wheezes. She has no rales.  Abdominal: Soft. Bowel sounds are normal. She exhibits no distension. There is no tenderness. There is no guarding.  Musculoskeletal: She exhibits no edema or tenderness.  Lymphadenopathy:    She has no cervical adenopathy.  Neurological: She is  alert and oriented to person, place, and time. No cranial nerve deficit.  Skin: Skin is warm and dry. No rash noted. No erythema. No pallor.  Psychiatric: Affect and judgment normal.    LABORATORY DATA:  CBC    Component Value Date/Time   WBC 5.5 04/02/2017 0902   RBC 3.71 (L) 04/02/2017 0902   RBC 3.71 (L) 04/02/2017 0902   HGB 10.9 (L) 04/02/2017 0902   HCT 34.3 (L) 04/02/2017 0902   PLT 270 04/02/2017 0902   MCV 92.5 04/02/2017 0902   MCH 29.4 04/02/2017 0902   MCHC 31.8 04/02/2017  0902   RDW 12.9 04/02/2017 0902   LYMPHSABS 1.6 04/02/2017 0902   MONOABS 0.5 04/02/2017 0902   EOSABS 0.2 04/02/2017 0902   BASOSABS 0.0 04/02/2017 0902    CMP     Component Value Date/Time   NA 138 04/02/2017 0902   K 4.0 04/02/2017 0902   CL 103 04/02/2017 0902   CO2 27 04/02/2017 0902   GLUCOSE 188 (H) 04/02/2017 0902   BUN 16 04/02/2017 0902   CREATININE 1.01 (H) 04/02/2017 0902   CREATININE 0.98 (H) 03/22/2017 0803   CALCIUM 9.3 04/02/2017 0902   PROT 7.3 04/02/2017 0902   ALBUMIN 3.6 04/02/2017 0902   AST 31 04/02/2017 0902   ALT 33 04/02/2017 0902   ALKPHOS 78 04/02/2017 0902   BILITOT 0.5 04/02/2017 0902   GFRNONAA 52 (L) 04/02/2017 0902   GFRNONAA 56 (L) 03/22/2017 0803   GFRAA >60 04/02/2017 0902   GFRAA 64 03/22/2017 0803      ASSESSMENT and THERAPY PLAN:  Normocytic Anemia  PLAN: Hemoglobin improving on its own. Will continue observation at this time.  I will also check a multiple myeloma panel and hemoglobin electrophoresis for completeness of anemia workup. RTC in 3 months for follow up with labs.    Orders Placed This Encounter  Procedures  . CBC with Differential    Standing Status:   Future    Standing Expiration Date:   04/22/2018  . Comprehensive metabolic panel    Standing Status:   Future    Standing Expiration Date:   04/22/2018  . Multiple Myeloma Panel (SPEP&IFE w/QIG)    Standing Status:   Future    Standing Expiration Date:   04/22/2018  . Hemoglobinopathy evaluation    Standing Status:   Future    Standing Expiration Date:   04/22/2018    All questions were answered. The patient knows to call the clinic with any problems, questions or concerns. We can certainly see the patient much sooner if necessary.  This note was electronically signed. Twana First, MD 04/22/2017

## 2017-05-30 ENCOUNTER — Other Ambulatory Visit: Payer: Self-pay | Admitting: Family Medicine

## 2017-06-03 ENCOUNTER — Ambulatory Visit (INDEPENDENT_AMBULATORY_CARE_PROVIDER_SITE_OTHER): Payer: Medicare Other | Admitting: Gastroenterology

## 2017-06-03 ENCOUNTER — Other Ambulatory Visit: Payer: Self-pay

## 2017-06-03 ENCOUNTER — Encounter: Payer: Self-pay | Admitting: Gastroenterology

## 2017-06-03 VITALS — BP 136/77 | HR 79 | Temp 97.8°F | Ht 67.0 in | Wt 189.6 lb

## 2017-06-03 DIAGNOSIS — D649 Anemia, unspecified: Secondary | ICD-10-CM | POA: Diagnosis not present

## 2017-06-03 DIAGNOSIS — Z86010 Personal history of colonic polyps: Secondary | ICD-10-CM

## 2017-06-03 MED ORDER — PEG 3350-KCL-NA BICARB-NACL 420 G PO SOLR: 4000.0000 mL | ORAL | 0 refills | Status: DC

## 2017-06-03 NOTE — Patient Instructions (Signed)
1. Colonoscopy as scheduled. Please see separate instructions. 

## 2017-06-03 NOTE — Assessment & Plan Note (Addendum)
Mild normocytic anemia without evidence of IDA. No overt GI bleeding, Heme negative X 1 at onset of anemia in 12/2016.   Heartburn controlled on PPI. No lower GI symptoms. Last colonoscopy reportedly in 2007 by Dr. Elpidio AnisLeroy Smith, patient states she had polyps removed.  At this time, would recommend surveillance colonoscopy for history of polyps. I have discussed the risks, alternatives, benefits with regards to but not limited to the risk of reaction to medication, bleeding, infection, perforation and the patient is agreeable to proceed. Written consent to be obtained.  Explained to the patient that if she develops iron deficiency, she may need further GI workup outside of surveillance colonoscopy. Current anemia may be due to anemia of chronic disease. Recommend she continue to follow with hematology as per their recommendations.

## 2017-06-03 NOTE — Progress Notes (Signed)
cc'ed to pcp °

## 2017-06-03 NOTE — Progress Notes (Addendum)
REVIEWED-NO ADDITIONAL RECOMMENDATIONS.  Primary Care Physician:  Kerri PerchesSimpson, Margaret E, MD  Primary Gastroenterologist:  Jonette EvaSandi Fields, MD   Chief Complaint  Patient presents with  . Anemia    HPI:  Kimberly Freeman is a 76 y.o. female presents today to consider colonoscopy due to anemia, referring provider her hematologist, Dr. Johney MaineJanak Choksi. Patient reports last colonoscopy in 2007 by Dr. Elpidio AnisLeroy Smith, polyps removed. Cannot see reports. She is followed by hematology/oncology for normocytic anemia. History of heme-negative stool back in March. Her iron, ferritin, vitamin B12, folate all normal in June.   11/2015: Hgb 12.1 12/2016: Hgb 10.7 03/2017: Hgb 10.9  Clinically she feels well. She's very active. She goes between [place] locally [place]. Denies abdominal pain. No constipation or diarrhea. No melena or rectal bleeding. No dysphagia. She has a history of chronic heartburn, well-controlled on PPI.  Current Outpatient Prescriptions  Medication Sig Dispense Refill  . acetaminophen (TYLENOL) 500 MG tablet Take 500 mg by mouth as needed. For pain    . aspirin 325 MG tablet Take 325 mg by mouth every morning.    Marland Kitchen. atorvastatin (LIPITOR) 40 MG tablet TAKE 1 TABLET BY MOUTH AT BEDTIME 90 tablet 0  . glipiZIDE (GLUCOTROL XL) 5 MG 24 hr tablet Take 1 tablet (5 mg total) by mouth daily. 90 tablet 1  . glucose blood test strip (Onetouch Ultrablue ) Use as instructed for twice daily testing 300 each 1  . Insulin Glargine (BASAGLAR KWIKPEN) 100 UNIT/ML SOPN Inject 0.5 mLs (50 Units total) into the skin at bedtime. 45 mL 1  . Insulin Pen Needle (NOVOFINE) 30G X 8 MM MISC Inject 10 each into the skin as directed. 100 each 5  . irbesartan (AVAPRO) 300 MG tablet Take 1 tablet (300 mg total) by mouth daily. 90 tablet 1  . Lancets (ONETOUCH ULTRASOFT) lancets USE AS DIRECTED TWICE DAILY 200 each 1  . metFORMIN (GLUCOPHAGE-XR) 500 MG 24 hr tablet TAKE 2 TABLETS BY MOUTH TWICE  DAILY 360 tablet 0  . metoprolol tartrate (LOPRESSOR) 50 MG tablet TAKE 1 TABLET BY MOUTH TWICE DAILY 180 tablet 2  . Multiple Vitamin (MULTIVITAMIN WITH MINERALS) TABS tablet Take 1 tablet by mouth daily.    . nitroGLYCERIN (NITROSTAT) 0.4 MG SL tablet Place 1 tablet (0.4 mg total) under the tongue every 5 (five) minutes as needed for chest pain. 30 tablet o  . pantoprazole (PROTONIX) 40 MG tablet Take 1 tablet (40 mg total) by mouth daily. 90 tablet 1  . penicillin v potassium (VEETID) 500 MG tablet as needed.     Marland Kitchen. spironolactone (ALDACTONE) 25 MG tablet TAKE 1 TABLET BY MOUTH DAILY 90 tablet 0  . traMADol (ULTRAM) 50 MG tablet One tablet  at bedtime as needed, for pain 30 tablet 0   No current facility-administered medications for this visit.     Allergies as of 06/03/2017 - Review Complete 06/03/2017  Allergen Reaction Noted  . Poison oak extract [poison oak extract] Hives 10/25/2013  . Sulfonamide derivatives Itching 04/21/2008    Past Medical History:  Diagnosis Date  . Diabetes mellitus, type 2 ([facility])   . Essential hypertension   . GERD (gastroesophageal reflux disease)   . Hyperlipidemia   . Obesity     Past Surgical History:  Procedure Laterality Date  . ABDOMINAL HYSTERECTOMY    . CHOLECYSTECTOMY  2004    Family History  Problem Relation Age of Onset  . Leukemia Mother   .  Cancer Mother   . Prostate cancer Father   . Lung cancer Sister 40  . Diabetes Sister   . Colon cancer Neg Hx     Social History   Social History  . Marital status: Married    Spouse name: N/A  . Number of children: N/A  . Years of education: N/A   Occupational History  . Not on file.   Social History Main Topics  . Smoking status: Never Smoker  . Smokeless tobacco: Never Used  . Alcohol use No  . Drug use: No  . Sexual activity: Not Currently    Partners: Male   Other Topics Concern  . Not on file   Social History Narrative  . No narrative on file       ROS:  General: Negative for anorexia, weight loss, fever, chills, fatigue, weakness. Eyes: Negative for vision changes.  ENT: Negative for hoarseness, difficulty swallowing , nasal congestion. CV: Negative for chest pain, angina, palpitations, dyspnea on exertion, peripheral edema.  Respiratory: Negative for dyspnea at rest, dyspnea on exertion, cough, sputum, wheezing.  GI: See history of present illness. GU:  Negative for dysuria, hematuria, urinary incontinence, urinary frequency, nocturnal urination.  MS: Negative for joint pain, low back pain.  Derm: Negative for rash or itching.  Neuro: Negative for weakness, abnormal sensation, seizure, frequent headaches, memory loss, confusion.  Psych: Negative for anxiety, depression, suicidal ideation, hallucinations.  Endo: Negative for unusual weight change.  Heme: Negative for bruising or bleeding. Allergy: Negative for rash or hives.    Physical Examination:  BP 136/77   Pulse 79   Temp 97.8 F (36.6 C) (Oral)   Ht 5\' 7"  (1.702 m)   Wt 189 lb 9.6 oz (86 kg)   BMI 29.70 kg/m    General: Well-nourished, well-developed in no acute distress.  Head: Normocephalic, atraumatic.   Eyes: Conjunctiva pink, no icterus. Mouth: Oropharyngeal mucosa moist and pink , no lesions erythema or exudate. Neck: Supple without thyromegaly, masses, or lymphadenopathy.  Lungs: Clear to auscultation bilaterally.  Heart: Regular rate and rhythm, no murmurs rubs or gallops.  Abdomen: Bowel sounds are normal, nontender, nondistended, no hepatosplenomegaly or masses, no abdominal bruits or    hernia , no rebound or guarding.   Rectal: deferred Extremities: No lower extremity edema. No clubbing or deformities.  Neuro: Alert and oriented x 4 , grossly normal neurologically.  Skin: Warm and dry, no rash or jaundice.   Psych: Alert and cooperative, normal mood and affect.  Labs: Lab Results  Component Value Date   CREATININE 1.01 (H) 04/02/2017   BUN 16  04/02/2017   NA 138 04/02/2017   K 4.0 04/02/2017   CL 103 04/02/2017   CO2 27 04/02/2017   Lab Results  Component Value Date   ALT 33 04/02/2017   AST 31 04/02/2017   ALKPHOS 78 04/02/2017   BILITOT 0.5 04/02/2017   Lab Results  Component Value Date   WBC 5.5 04/02/2017   HGB 10.9 (L) 04/02/2017   HCT 34.3 (L) 04/02/2017   MCV 92.5 04/02/2017   PLT 270 04/02/2017   Lab Results  Component Value Date   IRON 47 04/02/2017   TIBC 318 04/02/2017   FERRITIN 120 04/02/2017   Lab Results  Component Value Date   VITAMINB12 346 04/02/2017   Lab Results  Component Value Date   FOLATE 66.9 04/02/2017     Imaging Studies: No results found.

## 2017-06-04 ENCOUNTER — Other Ambulatory Visit: Payer: Self-pay

## 2017-06-04 MED ORDER — INSULIN PEN NEEDLE 32G X 6 MM MISC: 3 refills | Status: DC

## 2017-06-06 DIAGNOSIS — E119 Type 2 diabetes mellitus without complications: Secondary | ICD-10-CM | POA: Diagnosis not present

## 2017-06-25 DIAGNOSIS — E1142 Type 2 diabetes mellitus with diabetic polyneuropathy: Secondary | ICD-10-CM | POA: Diagnosis not present

## 2017-06-25 DIAGNOSIS — B351 Tinea unguium: Secondary | ICD-10-CM | POA: Diagnosis not present

## 2017-07-10 ENCOUNTER — Ambulatory Visit: Payer: Medicare Other | Admitting: Family Medicine

## 2017-07-16 ENCOUNTER — Other Ambulatory Visit: Payer: Self-pay | Admitting: Family Medicine

## 2017-07-16 DIAGNOSIS — Z1231 Encounter for screening mammogram for malignant neoplasm of breast: Secondary | ICD-10-CM

## 2017-07-18 ENCOUNTER — Encounter (HOSPITAL_COMMUNITY): Payer: Self-pay

## 2017-07-18 ENCOUNTER — Ambulatory Visit (HOSPITAL_COMMUNITY)
Admission: RE | Admit: 2017-07-18 | Discharge: 2017-07-18 | Disposition: A | Discharge status: Home / Self Care | Payer: Medicare Other | Source: Ambulatory Visit | Attending: Gastroenterology | Admitting: Gastroenterology

## 2017-07-18 ENCOUNTER — Encounter (HOSPITAL_COMMUNITY)
Admission: RE | Disposition: A | Discharge status: Home / Self Care | Payer: Self-pay | Source: Ambulatory Visit | Attending: Gastroenterology

## 2017-07-18 DIAGNOSIS — Z7982 Long term (current) use of aspirin: Secondary | ICD-10-CM | POA: Diagnosis not present

## 2017-07-18 DIAGNOSIS — D649 Anemia, unspecified: Secondary | ICD-10-CM | POA: Insufficient documentation

## 2017-07-18 DIAGNOSIS — K2970 Gastritis, unspecified, without bleeding: Secondary | ICD-10-CM | POA: Diagnosis not present

## 2017-07-18 DIAGNOSIS — K259 Gastric ulcer, unspecified as acute or chronic, without hemorrhage or perforation: Secondary | ICD-10-CM | POA: Diagnosis not present

## 2017-07-18 DIAGNOSIS — K644 Residual hemorrhoidal skin tags: Secondary | ICD-10-CM | POA: Diagnosis not present

## 2017-07-18 DIAGNOSIS — Z86010 Personal history of colonic polyps: Secondary | ICD-10-CM

## 2017-07-18 DIAGNOSIS — K648 Other hemorrhoids: Secondary | ICD-10-CM | POA: Insufficient documentation

## 2017-07-18 DIAGNOSIS — K449 Diaphragmatic hernia without obstruction or gangrene: Secondary | ICD-10-CM | POA: Insufficient documentation

## 2017-07-18 DIAGNOSIS — Q438 Other specified congenital malformations of intestine: Secondary | ICD-10-CM | POA: Diagnosis not present

## 2017-07-18 DIAGNOSIS — D500 Iron deficiency anemia secondary to blood loss (chronic): Secondary | ICD-10-CM | POA: Diagnosis not present

## 2017-07-18 DIAGNOSIS — K2980 Duodenitis without bleeding: Secondary | ICD-10-CM | POA: Insufficient documentation

## 2017-07-18 HISTORY — PX: BIOPSY: SHX5522

## 2017-07-18 HISTORY — PX: ESOPHAGOGASTRODUODENOSCOPY: SHX5428

## 2017-07-18 HISTORY — PX: COLONOSCOPY: SHX5424

## 2017-07-18 LAB — GLUCOSE, CAPILLARY: GLUCOSE-CAPILLARY: 117 mg/dL — AB (ref 65–99)

## 2017-07-18 SURGERY — COLONOSCOPY
Anesthesia: Moderate Sedation

## 2017-07-18 MED ORDER — STERILE WATER FOR IRRIGATION IR SOLN
Status: DC | PRN
  Administered 2017-07-18: 2.5 mL

## 2017-07-18 MED ORDER — SODIUM CHLORIDE 0.9 % IV SOLN
INTRAVENOUS | Status: DC
  Administered 2017-07-18: 08:00:00 via INTRAVENOUS

## 2017-07-18 MED ORDER — MIDAZOLAM HCL 5 MG/5ML IJ SOLN
INTRAMUSCULAR | Status: DC | PRN
  Administered 2017-07-18: 2 mg via INTRAVENOUS
  Administered 2017-07-18: 1 mg via INTRAVENOUS
  Administered 2017-07-18: 2 mg via INTRAVENOUS

## 2017-07-18 MED ORDER — PANTOPRAZOLE SODIUM 40 MG PO TBEC: DELAYED_RELEASE_TABLET | ORAL | 3 refills | Status: DC

## 2017-07-18 MED ORDER — LIDOCAINE VISCOUS 2 % MT SOLN
OROMUCOSAL | Status: DC | PRN
  Administered 2017-07-18: 1 via OROMUCOSAL

## 2017-07-18 MED ORDER — MEPERIDINE HCL 100 MG/ML IJ SOLN
INTRAMUSCULAR | Status: DC | PRN
Start: 2017-07-18 — End: 2017-07-18
  Administered 2017-07-18 (×4): 25 mg via INTRAVENOUS

## 2017-07-18 MED ORDER — MEPERIDINE HCL 100 MG/ML IJ SOLN
INTRAMUSCULAR | Status: AC
  Filled 2017-07-18: qty 2

## 2017-07-18 MED ORDER — MIDAZOLAM HCL 5 MG/5ML IJ SOLN
INTRAMUSCULAR | Status: AC
  Filled 2017-07-18: qty 10

## 2017-07-18 MED ORDER — LIDOCAINE VISCOUS 2 % MT SOLN
OROMUCOSAL | Status: AC
  Filled 2017-07-18: qty 15

## 2017-07-18 NOTE — Discharge Instructions (Signed)
THE SOURCE FOR YOUR LOW BLOOD COUNT WAS IDENTIFIED. 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 YOU DID NOT HAVE ANY POLYPS. 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 I biopsied your stomach.    DRINK WATER TO KEEP YOUR URINE LIGHT YELLOW.  FOLLOW A HIGH FIBER/LOW FAT DIET. AVOID ITEMS THAT CAUSE BLOATING. SEE INFO BELOW.  TO PREVENT GASTRITIS AND DUODENITIS, ADD PROTONIX EVERY DAY NOT IF NEEDED. TAKE 30 MINUTES PRIOR TO BREAKFAST.  YOUR BIOPSY RESULTS WILL BE AVAILABLE IN MY CHART AFTER OCT 2 AND MY OFFICE WILL CONTACT YOU IN 10-14 DAYS WITH YOUR RESULTS.   FOLLOW UP IN 4 MOS.   Jan 2nd at 1:30 pm  NEXT COLONOSCOPY IN 10-15 YEARS IF THE BENEFITS OUTWEIGH THE RISKS.   ENDOSCOPY Care After Read the instructions outlined below and refer to this sheet in the next week. These discharge instructions provide you with general information on caring for yourself after you leave the hospital. While your treatment has been planned according to the most current medical practices available, unavoidable complications occasionally occur. If you have any problems or questions after discharge, call DR. FIELDS, [phone].  ACTIVITY  You may resume your regular activity, but move at a slower pace for the next 24 hours.   Take frequent rest periods for the next 24 hours.   Walking will help get rid of the air and reduce the bloated feeling in your belly (abdomen).   No driving for 24 hours (because of the medicine (anesthesia) used during the test).   You may shower.   Do not sign any important legal documents or operate any machinery for 24 hours (because of the anesthesia used during the test).    NUTRITION  Drink plenty of fluids.   You may resume your normal diet as instructed by your doctor.   Begin with a light meal and progress to your normal diet. Heavy or fried foods are harder to digest and may make you feel sick to your stomach (nauseated).    Avoid alcoholic beverages for 24 hours or as instructed.    MEDICATIONS  You may resume your normal medications.   WHAT YOU CAN EXPECT TODAY  Some feelings of bloating in the abdomen.   Passage of more gas than usual.   Spotting of blood in your stool or on the toilet paper  .  IF YOU HAD POLYPS REMOVED DURING THE ENDOSCOPY:  Eat a soft diet IF YOU HAVE NAUSEA, BLOATING, ABDOMINAL PAIN, OR VOMITING.    FINDING OUT THE RESULTS OF YOUR TEST Not all test results are available during your visit. DR. [name] WILL CALL YOU WITHIN 14 DAYS OF YOUR PROCEDUE WITH YOUR RESULTS. Do not assume everything is normal if you have not heard from DR. FIELDS, CALL HER OFFICE AT [phone].  SEEK IMMEDIATE MEDICAL ATTENTION AND CALL THE OFFICE: [phone] IF:  You have more than a spotting of blood in your stool.   Your belly is swollen (abdominal distention).   You are nauseated or vomiting.   You have a temperature over 101F.   You have abdominal pain or discomfort that is severe or gets worse throughout the day.   Gastritis/DUODENITIS  Gastritis is an inflammation (the body's way of reacting to injury and/or infection) of the stomach. DUODENITIS is an inflammation (the body's way of reacting to injury and/or infection) of the FIRST PART OF THE SMALL INTESTINES. It is often caused by bacterial (germ) infections. It can  also be caused BY ASPIRIN, BC/GOODY POWDER'S, (IBUPROFEN) MOTRIN, OR ALEVE (NAPROXEN), chemicals (including alcohol), SPICY FOODS, and medications. This illness may be associated with generalized malaise (feeling tired, not well), UPPER ABDOMINAL STOMACH cramps, and fever. One common bacterial cause of gastritis is an organism known as H. Pylori. This can be treated with antibiotics.    High-Fiber Diet A high-fiber diet changes your normal diet to include more whole grains, legumes, fruits, and vegetables. Changes in the diet involve replacing refined carbohydrates with  unrefined foods. The calorie level of the diet is essentially unchanged. The Dietary Reference Intake (recommended amount) for adult males is 38 grams per day. For adult females, it is 25 grams per day. Pregnant and lactating women should consume 28 grams of fiber per day. Fiber is the intact part of a plant that is not broken down during digestion. Functional fiber is fiber that has been isolated from the plant to provide a beneficial effect in the body. PURPOSE  Increase stool bulk.   Ease and regulate bowel movements.   Lower cholesterol.   REDUCE RISK OF COLON CANCER  INDICATIONS THAT YOU NEED MORE FIBER  Constipation and hemorrhoids.   Uncomplicated diverticulosis (intestine condition) and irritable bowel syndrome.   Weight management.   As a protective measure against hardening of the arteries (atherosclerosis), diabetes, and cancer.   GUIDELINES FOR INCREASING FIBER IN THE DIET  Start adding fiber to the diet slowly. A gradual increase of about 5 more grams (2 slices of whole-wheat bread, 2 servings of most fruits or vegetables, or 1 bowl of high-fiber cereal) per day is best. Too rapid an increase in fiber may result in constipation, flatulence, and bloating.   Drink enough water and fluids to keep your urine clear or pale yellow. Water, juice, or caffeine-free drinks are recommended. Not drinking enough fluid may cause constipation.   Eat a variety of high-fiber foods rather than one type of fiber.   Try to increase your intake of fiber through using high-fiber foods rather than fiber pills or supplements that contain small amounts of fiber.   The goal is to change the types of food eaten. Do not supplement your present diet with high-fiber foods, but replace foods in your present diet.    INCLUDE A VARIETY OF FIBER SOURCES  Replace refined and processed grains with whole grains, canned fruits with fresh fruits, and incorporate other fiber sources. White rice, white  breads, and most bakery goods contain little or no fiber.   Brown whole-grain rice, buckwheat oats, and many fruits and vegetables are all good sources of fiber. These include: broccoli, Brussels sprouts, cabbage, cauliflower, beets, sweet potatoes, white potatoes (skin on), carrots, tomatoes, eggplant, squash, berries, fresh fruits, and dried fruits.   Cereals appear to be the richest source of fiber. Cereal fiber is found in whole grains and bran. Bran is the fiber-rich outer coat of cereal grain, which is largely removed in refining. In whole-grain cereals, the bran remains. In breakfast cereals, the largest amount of fiber is found in those with "bran" in their names. The fiber content is sometimes indicated on the label.   You may need to include additional fruits and vegetables each day.   In baking, for 1 cup white flour, you may use the following substitutions:   1 cup whole-wheat flour minus 2 tablespoons.   1/2 cup white flour plus 1/2 cup whole-wheat flour.    Low-Fat Diet BREADS, CEREALS, PASTA, RICE, DRIED PEAS, AND BEANS These products  are high in carbohydrates and most are low in fat. Therefore, they can be increased in the diet as substitutes for fatty foods. They too, however, contain calories and should not be eaten in excess. Cereals can be eaten for snacks as well as for breakfast.   FRUITS AND VEGETABLES It is good to eat fruits and vegetables. Besides being sources of fiber, both are rich in vitamins and some minerals. They help you get the daily allowances of these nutrients. Fruits and vegetables can be used for snacks and desserts.  MEATS Limit lean meat, chicken, [place], and fish to no more than 6 ounces per day. Beef, Pork, and Lamb Use lean cuts of beef, pork, and lamb. Lean cuts include:  Extra-lean ground beef.  Arm roast.  Sirloin tip.  Center-cut ham.  Round steak.  Loin chops.  Rump roast.  Tenderloin.  Trim all fat off the outside of meats before  cooking. It is not necessary to severely decrease the intake of red meat, but lean choices should be made. Lean meat is rich in protein and contains a highly absorbable form of iron. Premenopausal women, in particular, should avoid reducing lean red meat because this could increase the risk for low red blood cells (iron-deficiency anemia).  Chicken and [place] These are good sources of protein. The fat of poultry can be reduced by removing the skin and underlying fat layers before cooking. Chicken and [place] can be substituted for lean red meat in the diet. Poultry should not be fried or covered with high-fat sauces. Fish and Shellfish Fish is a good source of protein. Shellfish contain cholesterol, but they usually are low in saturated fatty acids. The preparation of fish is important. Like chicken and [place], they should not be fried or covered with high-fat sauces. EGGS Egg whites contain no fat or cholesterol. They can be eaten often. Try 1 to 2 egg whites instead of whole eggs in recipes or use egg substitutes that do not contain yolk. MILK AND DAIRY PRODUCTS Use skim or 1% milk instead of 2% or whole milk. Decrease whole milk, natural, and processed cheeses. Use nonfat or low-fat (2%) cottage cheese or low-fat cheeses made from vegetable oils. Choose nonfat or low-fat (1 to 2%) yogurt. Experiment with evaporated skim milk in recipes that call for heavy cream. Substitute low-fat yogurt or low-fat cottage cheese for sour cream in dips and salad dressings. Have at least 2 servings of low-fat dairy products, such as 2 glasses of skim (or 1%) milk each day to help get your daily calcium intake. FATS AND OILS Reduce the total intake of fats, especially saturated fat. Butterfat, lard, and beef fats are high in saturated fat and cholesterol. These should be avoided as much as possible. Vegetable fats do not contain cholesterol, but certain vegetable fats, such as coconut oil, palm oil, and palm kernel oil  are very high in saturated fats. These should be limited. These fats are often used in bakery goods, processed foods, popcorn, oils, and nondairy creamers. Vegetable shortenings and some peanut butters contain hydrogenated oils, which are also saturated fats. Read the labels on these foods and check for saturated vegetable oils. Unsaturated vegetable oils and fats do not raise blood cholesterol. However, they should be limited because they are fats and are high in calories. Total fat should still be limited to 30% of your daily caloric intake. Desirable liquid vegetable oils are corn oil, cottonseed oil, olive oil, canola oil, safflower oil, soybean oil, and sunflower oil.  Peanut oil is not as good, but small amounts are acceptable. Buy a heart-healthy tub margarine that has no partially hydrogenated oils in the ingredients. Mayonnaise and salad dressings often are made from unsaturated fats, but they should also be limited because of their high calorie and fat content. Seeds, nuts, peanut butter, olives, and avocados are high in fat, but the fat is mainly the unsaturated type. These foods should be limited mainly to avoid excess calories and fat. OTHER EATING TIPS Snacks  Most sweets should be limited as snacks. They tend to be rich in calories and fats, and their caloric content outweighs their nutritional value. Some good choices in snacks are graham crackers, melba toast, soda crackers, bagels (no egg), English muffins, fruits, and vegetables. These snacks are preferable to snack crackers, [place] fries, TORTILLA CHIPS, and POTATO chips. Popcorn should be air-popped or cooked in small amounts of liquid vegetable oil. Desserts Eat fruit, low-fat yogurt, and fruit ices instead of pastries, cake, and cookies. Sherbet, angel food cake, gelatin dessert, frozen low-fat yogurt, or other frozen products that do not contain saturated fat (pure fruit juice bars, frozen ice pops) are also acceptable.  COOKING  METHODS Choose those methods that use little or no fat. They include: Poaching.  Braising.  Steaming.  Grilling.  Baking.  Stir-frying.  Broiling.  Microwaving.  Foods can be cooked in a nonstick pan without added fat, or use a nonfat cooking spray in regular cookware. Limit fried foods and avoid frying in saturated fat. Add moisture to lean meats by using water, broth, cooking wines, and other nonfat or low-fat sauces along with the cooking methods mentioned above. Soups and stews should be chilled after cooking. The fat that forms on top after a few hours in the refrigerator should be skimmed off. When preparing meals, avoid using excess salt. Salt can contribute to raising blood pressure in some people.  EATING AWAY FROM HOME Order entres, potatoes, and vegetables without sauces or butter. When meat exceeds the size of a deck of cards (3 to 4 ounces), the rest can be taken home for another meal. Choose vegetable or fruit salads and ask for low-calorie salad dressings to be served on the side. Use dressings sparingly. Limit high-fat toppings, such as bacon, crumbled eggs, cheese, sunflower seeds, and olives. Ask for heart-healthy tub margarine instead of butter.  Hemorrhoids Hemorrhoids are dilated (enlarged) veins around the rectum. Sometimes clots will form in the veins. This makes them swollen and painful. These are called thrombosed hemorrhoids. Causes of hemorrhoids include:  Constipation.   Straining to have a bowel movement.   HEAVY LIFTING HOME CARE INSTRUCTIONS  Eat a well balanced diet and drink 6 to 8 glasses of water every day to avoid constipation. You may also use a bulk laxative.   Avoid straining to have bowel movements.   Keep anal area dry and clean.   Do not use a donut shaped pillow or sit on the toilet for long periods. This increases blood pooling and pain.   Move your bowels when your body has the urge; this will require less straining and will decrease  pain and pressure.

## 2017-07-18 NOTE — H&P (Signed)
Primary Care Physician:  [name], MD Primary Gastroenterologist:  Dr. [name]  Pre-Procedure History & Physical: HPI:  Kimberly Freeman is a 76 y.o. female here for Anemia-NORMOCYTIC.  Past Medical History:  Diagnosis Date  . Diabetes mellitus, type 2 (HCC)   . Essential hypertension   . GERD (gastroesophageal reflux disease)   . Hyperlipidemia     Past Surgical History:  Procedure Laterality Date  . ABDOMINAL HYSTERECTOMY    . CHOLECYSTECTOMY  2004    Prior to Admission medications   Medication Sig Start Date End Date Taking? Authorizing Provider  acetaminophen (TYLENOL) 500 MG tablet Take 500 mg by mouth daily as needed for mild pain or moderate pain. For pain    Yes [provider]  aspirin 325 MG tablet Take 325 mg by mouth every morning.   Yes [provider]  atorvastatin (LIPITOR) 40 MG tablet TAKE 1 TABLET BY MOUTH AT BEDTIME 05/30/17  Yes [name], MD  glipiZIDE (GLUCOTROL XL) 5 MG 24 hr tablet Take 1 tablet (5 mg total) by mouth daily. 03/11/17  Yes [name], MD  glucose blood test strip (Onetouch Ultrablue ) Use as instructed for twice daily testing 09/12/16  Yes [name], MD  Insulin Glargine (BASAGLAR KWIKPEN) 100 UNIT/ML SOPN Inject 0.5 mLs (50 Units total) into the skin at bedtime. 02/04/17  Yes [name], MD  Insulin Pen Needle 32G X 6 MM MISC Use to inject insulin daily dx e11.65 06/04/17  Yes [name], MD  irbesartan (AVAPRO) 300 MG tablet Take 1 tablet (300 mg total) by mouth daily. 03/05/17  Yes [name], MD  Lancets [facility] ULTRASOFT) lancets USE AS DIRECTED TWICE DAILY 01/25/16  Yes [name], MD  metFORMIN (GLUCOPHAGE-XR) 500 MG 24 hr tablet TAKE 2 TABLETS BY MOUTH TWICE DAILY 05/30/17  Yes [name], MD  metoprolol tartrate (LOPRESSOR) 50 MG tablet TAKE 1 TABLET BY MOUTH TWICE DAILY 05/30/17  Yes [name], MD  Multiple Vitamin (MULTIVITAMIN WITH  MINERALS) TABS tablet Take 1 tablet by mouth daily.   Yes [provider]  polyethylene glycol-electrolytes (TRILYTE) 420 g solution Take 4,000 mLs by mouth as directed. Patient taking differently: Take 4,000 mLs by mouth as directed. Starts 9/26 for colonoscopy on 9/27 06/03/17  Yes Roshell Brigham, Darleene Cleaver, MD  spironolactone (ALDACTONE) 25 MG tablet TAKE 1 TABLET BY MOUTH DAILY 05/30/17  Yes [name], MD  traMADol (ULTRAM) 50 MG tablet One tablet  at bedtime as needed, for pain Patient taking differently: Take 50 mg by mouth daily as needed for moderate pain. One tablet  at bedtime as needed, for pain 03/26/17  Yes [name], MD  nitroGLYCERIN (NITROSTAT) 0.4 MG SL tablet Place 1 tablet (0.4 mg total) under the tongue every 5 (five) minutes as needed for chest pain. 12/22/15   [name], MD  pantoprazole (PROTONIX) 40 MG tablet Take 1 tablet (40 mg total) by mouth daily. Patient taking differently: Take 40 mg by mouth daily as needed (heartburn).  01/08/17   [name], MD    Allergies as of 06/03/2017 - Review Complete 06/03/2017  Allergen Reaction Noted  . Poison oak extract [poison oak extract] Hives 10/25/2013  . Sulfonamide derivatives Itching 04/21/2008    Family History  Problem Relation Age of Onset  . Leukemia Mother   . Cancer Mother   . Prostate cancer Father   . Lung cancer Sister 76  .  Diabetes Sister   . Colon cancer Neg Hx     Social History   Social History  . Marital status: Married    Spouse name: N/A  . Number of children: N/A  . Years of education: N/A   Occupational History  . Not on file.   Social History Main Topics  . Smoking status: Never Smoker  . Smokeless tobacco: Never Used  . Alcohol use No  . Drug use: No  . Sexual activity: Not Currently    Partners: Male   Other Topics Concern  . Not on file   Social History Narrative  . No narrative on file    Review of Systems: See HPI, otherwise negative  ROS   Physical Exam: BP (!) 158/56   Pulse 68   Temp 98.3 F (36.8 C) (Oral)   Resp 19   Ht  (1.702 m)   Wt 189 lb (85.7 kg)   SpO2 98%   BMI 29.60 kg/m  General:   Alert,  pleasant and cooperative in NAD Head:  Normocephalic and atraumatic. Neck:  Supple; Lungs:  Clear throughout to auscultation.    Heart:  Regular rate and rhythm. Abdomen:  Soft, nontender and nondistended. Normal bowel sounds, without guarding, and without rebound.   Neurologic:  Alert and  oriented x4;  grossly normal neurologically.  Impression/Plan:     Anemia-NORMOCYTIC   PLAN:  1. TCS/?EGD TODAY DISCUSSED PROCEDURE, BENEFITS, & RISKS: < 1% chance of medication reaction, bleeding, perforation, or rupture of spleen/liver.

## 2017-07-18 NOTE — Op Note (Signed)
[facility] Patient Name: Kimberly Freeman Procedure Date: 07/18/2017 7:57 AM MRN: [id number] Date of Birth: 08-01-1941 Attending MD: Jonette Eva MD, MD CSN: [id number] Age: 76 Admit Type: Outpatient Procedure:                Colonoscopy, DIAGNOSTIC Indications:              Anemia, NORMOCYTIC ON ASA AND PRN PROTONIX Providers:                Jonette Eva MD, MD, Nena Polio, RN, Dyann Ruddle Referring MD:             Milus Mallick. Simpson MD, MD Medicines:                Meperidine 75 mg IV, Midazolam 5 mg IV Complications:            No immediate complications. Estimated Blood Loss:     Estimated blood loss: none. Procedure:                Pre-Anesthesia Assessment:                           - Prior to the procedure, a History and Physical                            was performed, and patient medications and                            allergies were reviewed. The patient's tolerance of                            previous anesthesia was also reviewed. The risks                            and benefits of the procedure and the sedation                            options and risks were discussed with the patient.                            All questions were answered, and informed consent                            was obtained. Prior Anticoagulants: The patient has                            taken aspirin, last dose was 1 week prior to                            procedure. ASA Grade Assessment: II - A patient                            with mild systemic disease. After reviewing the                            risks and benefits, the patient was deemed in  satisfactory condition to undergo the procedure.                            After obtaining informed consent, the colonoscope                            was passed under direct vision. Throughout the                            procedure, the patient's blood pressure, pulse, and   oxygen saturations were monitored continuously. The                            [id number] ([id number]) scope was introduced through                            the anus and advanced to the the cecum, identified                            by appendiceal orifice and ileocecal valve. The                            colonoscopy was somewhat difficult due to a                            tortuous colon. Successful completion of the                            procedure was aided by COLOWRAP. The patient                            tolerated the procedure fairly well. The quality of                            the bowel preparation was excellent. The ileocecal                            valve, appendiceal orifice, and rectum were                            photographed. Scope In: 8:29:07 AM Scope Out: 8:46:49 AM Scope Withdrawal Time: 0 hours 13 minutes 48 seconds  Total Procedure Duration: 0 hours 17 minutes 42 seconds  Findings:      The recto-sigmoid colon and sigmoid colon were moderately redundant.      The exam was otherwise without abnormality.      Internal hemorrhoids were found during retroflexion. The hemorrhoids       were small.      External hemorrhoids were found during retroflexion. The hemorrhoids       were moderate. Impression:               - Redundant LEFT colon.                           - The examination was otherwise normal.                           -  Internal hemorrhoids.                           - External hemorrhoids.                           - No SOURCE FOR NORMOCYTIC ANEMIA IDENTIFIED Moderate Sedation:      Moderate (conscious) sedation was administered by the endoscopy nurse       and supervised by the endoscopist. The following parameters were       monitored: oxygen saturation, heart rate, blood pressure, and response       to care. Total physician intraservice time was 44 minutes. Recommendation:           - High fiber diet.                           - Continue  present medications.                           - Return to my office in 4 months.                           - Patient has a contact number available for                            emergencies. The signs and symptoms of potential                            delayed complications were discussed with the                            patient. Return to normal activities tomorrow.                            Written discharge instructions were provided to the                            patient.                           - No repeat colonoscopy due to age. Procedure Code(s):        --- Professional ---                           [phone], Colonoscopy, flexible; diagnostic, including                            collection of specimen(s) by brushing or washing,                            when performed (separate procedure)                           99152, Moderate sedation services provided by the  same physician or other qualified health care                            professional performing the diagnostic or                            therapeutic service that the sedation supports,                            requiring the presence of an independent trained                            observer to assist in the monitoring of the                            patient's level of consciousness and physiological                            status; initial 15 minutes of intraservice time,                            patient age 37 years or older                           [phone], Moderate sedation services; each additional                            15 minutes intraservice time                           99153, Moderate sedation services; each additional                            15 minutes intraservice time Diagnosis Code(s):        --- Professional ---                           K64.4, Residual hemorrhoidal skin tags                           K64.8, Other hemorrhoids                            D64.9, Anemia, unspecified                           Q43.8, Other specified congenital malformations of                            intestine CPT copyright 2016 American Medical Association. All rights reserved. The codes documented in this report are preliminary and upon coder review may  be revised to meet current compliance requirements. Jonette Eva, MD Jonette Eva MD, MD 07/18/2017 9:07:54 AM This report has been signed electronically. Number of Addenda: 0

## 2017-07-18 NOTE — Op Note (Signed)
[facility] Patient Name: Kimberly Freeman Procedure Date: 07/18/2017 8:23 AM MRN: [id number] Date of Birth: December 29, 1940 Attending MD: Jonette Eva MD, MD CSN: [id number] Age: 76 Admit Type: Outpatient Procedure:                Upper GI endoscopy WITH COLD FORCEPS BIOPSY Indications:              Anemia ON ASA AND PRN PPI Providers:                Jonette Eva MD, MD, Nena Polio, RN, Dyann Ruddle Referring MD:             Milus Mallick. Simpson MD, MD Medicines:                TCS + Meperidine 25 mg IV Complications:            No immediate complications. Estimated Blood Loss:     Estimated blood loss was minimal. Procedure:                Pre-Anesthesia Assessment:                           - Prior to the procedure, a History and Physical                            was performed, and patient medications and                            allergies were reviewed. The patient's tolerance of                            previous anesthesia was also reviewed. The risks                            and benefits of the procedure and the sedation                            options and risks were discussed with the patient.                            All questions were answered, and informed consent                            was obtained. Prior Anticoagulants: The patient has                            taken aspirin, last dose was 1 week prior to                            procedure. ASA Grade Assessment: II - A patient                            with mild systemic disease. After reviewing the                            risks and benefits, the patient was deemed in  satisfactory condition to undergo the procedure.                           After obtaining informed consent, the endoscope was                            passed under direct vision. Throughout the                            procedure, the patient's blood pressure, pulse, and                            oxygen  saturations were monitored continuously. The                            [id number] ([id number]) scope was introduced through the                            mouth, and advanced to the second part of duodenum.                            The upper GI endoscopy was accomplished without                            difficulty. The patient tolerated the procedure                            well. Scope In: 8:52:28 AM Scope Out: 9:00:06 AM Total Procedure Duration: 0 hours 7 minutes 38 seconds  Findings:      The examined esophagus was normal.      A small hiatal hernia was present.      Patchy mild inflammation characterized by congestion (edema), erosions       and erythema was found in the cardia, in the gastric body and in the       gastric antrum. Biopsies were taken with a cold forceps for Helicobacter       pylori testing.      Diffuse moderate inflammation characterized by congestion (edema),       erosions and erythema was found in the duodenal bulb and in the second       portion of the duodenum. Impression:               - Small hiatal hernia.                           - NORMOCYTIC ANEMIA DUE TO Gastritis/Duodenitis IN                            SETTING OF ASA USE AND PRN PPI Moderate Sedation:      Moderate (conscious) sedation was administered by the endoscopy nurse       and supervised by the endoscopist. The following parameters were       monitored: oxygen saturation, heart rate, blood pressure, and response       to care. Total physician intraservice time was 44 minutes. Recommendation:           -  Await pathology results.                           - Use Protonix (pantoprazole) 40 mg PO daily.                           - High fiber diet.                           - Continue present medications.                           - Return to my office in 4 months.                           - Patient has a contact number available for                            emergencies. The signs and  symptoms of potential                            delayed complications were discussed with the                            patient. Return to normal activities tomorrow.                            Written discharge instructions were provided to the                            patient. Procedure Code(s):        --- Professional ---                           [phone], Moderate sedation services provided by the                            same physician or other qualified health care                            professional performing the diagnostic or                            therapeutic service that the sedation supports,                            requiring the presence of an independent trained                            observer to assist in the monitoring of the                            patient's level of consciousness and physiological  status; initial 15 minutes of intraservice time,                            patient age 78 years or older                           [phone], Moderate sedation services; each additional                            15 minutes intraservice time                           99153, Moderate sedation services; each additional                            15 minutes intraservice time Diagnosis Code(s):        --- Professional ---                           K44.9, Diaphragmatic hernia without obstruction or                            gangrene                           K29.70, Gastritis, unspecified, without bleeding                           K29.80, Duodenitis without bleeding                           D64.9, Anemia, unspecified CPT copyright 2016 American Medical Association. All rights reserved. The codes documented in this report are preliminary and upon coder review may  be revised to meet current compliance requirements. Jonette Eva, MD Jonette Eva MD, MD 07/18/2017 9:19:30 AM This report has been signed electronically. Number of  Addenda: 0

## 2017-07-22 ENCOUNTER — Encounter (HOSPITAL_COMMUNITY): Payer: Self-pay | Admitting: Gastroenterology

## 2017-07-23 ENCOUNTER — Encounter (HOSPITAL_BASED_OUTPATIENT_CLINIC_OR_DEPARTMENT_OTHER): Payer: Medicare Other | Admitting: Oncology

## 2017-07-23 ENCOUNTER — Encounter (HOSPITAL_COMMUNITY): Payer: Medicare Other | Attending: Oncology

## 2017-07-23 ENCOUNTER — Encounter (HOSPITAL_COMMUNITY): Payer: Self-pay

## 2017-07-23 VITALS — BP 157/62 | HR 71 | Temp 98.0°F | Resp 18 | Wt 190.4 lb

## 2017-07-23 DIAGNOSIS — K2980 Duodenitis without bleeding: Secondary | ICD-10-CM

## 2017-07-23 DIAGNOSIS — K2970 Gastritis, unspecified, without bleeding: Secondary | ICD-10-CM

## 2017-07-23 DIAGNOSIS — E119 Type 2 diabetes mellitus without complications: Secondary | ICD-10-CM | POA: Diagnosis not present

## 2017-07-23 DIAGNOSIS — D508 Other iron deficiency anemias: Secondary | ICD-10-CM

## 2017-07-23 DIAGNOSIS — Z7982 Long term (current) use of aspirin: Secondary | ICD-10-CM

## 2017-07-23 DIAGNOSIS — D649 Anemia, unspecified: Secondary | ICD-10-CM

## 2017-07-23 DIAGNOSIS — D500 Iron deficiency anemia secondary to blood loss (chronic): Secondary | ICD-10-CM

## 2017-07-23 DIAGNOSIS — Z794 Long term (current) use of insulin: Secondary | ICD-10-CM | POA: Diagnosis not present

## 2017-07-23 LAB — COMPREHENSIVE METABOLIC PANEL
ALT: 24 U/L (ref 14–54)
ANION GAP: 10 (ref 5–15)
AST: 24 U/L (ref 15–41)
Albumin: 4.2 g/dL (ref 3.5–5.0)
Alkaline Phosphatase: 84 U/L (ref 38–126)
BILIRUBIN TOTAL: 0.7 mg/dL (ref 0.3–1.2)
BUN: 18 mg/dL (ref 6–20)
CHLORIDE: 105 mmol/L (ref 101–111)
CO2: 24 mmol/L (ref 22–32)
Calcium: 9.8 mg/dL (ref 8.9–10.3)
Creatinine, Ser: 1.04 mg/dL — ABNORMAL HIGH (ref 0.44–1.00)
GFR calc Af Amer: 58 mL/min — ABNORMAL LOW (ref >60)
GFR, EST NON AFRICAN AMERICAN: 50 mL/min — AB (ref >60)
Glucose, Bld: 180 mg/dL — ABNORMAL HIGH (ref 65–99)
POTASSIUM: 3.8 mmol/L (ref 3.5–5.1)
Sodium: 139 mmol/L (ref 135–145)
TOTAL PROTEIN: 7.5 g/dL (ref 6.5–8.1)

## 2017-07-23 LAB — CBC WITH DIFFERENTIAL/PLATELET
BASOS ABS: 0 10*3/uL (ref 0.0–0.1)
BASOS PCT: 1 %
EOS PCT: 5 %
Eosinophils Absolute: 0.3 10*3/uL (ref 0.0–0.7)
HEMATOCRIT: 37 % (ref 36.0–46.0)
Hemoglobin: 12 g/dL (ref 12.0–15.0)
LYMPHS ABS: 2.2 10*3/uL (ref 0.7–4.0)
LYMPHS PCT: 38 %
MCH: 29.3 pg (ref 26.0–34.0)
MCHC: 32.4 g/dL (ref 30.0–36.0)
MCV: 90.5 fL (ref 78.0–100.0)
Monocytes Absolute: 0.5 10*3/uL (ref 0.1–1.0)
Monocytes Relative: 8 %
Neutro Abs: 2.9 10*3/uL (ref 1.7–7.7)
Neutrophils Relative %: 48 %
PLATELETS: 255 10*3/uL (ref 150–400)
RBC: 4.09 MIL/uL (ref 3.87–5.11)
RDW: 13.3 % (ref 11.5–15.5)
WBC: 5.9 10*3/uL (ref 4.0–10.5)

## 2017-07-23 LAB — FOLATE: Folate: 37 ng/mL (ref >5.9)

## 2017-07-23 LAB — IRON AND TIBC
Iron: 60 ug/dL (ref 28–170)
Saturation Ratios: 17 % (ref 10.4–31.8)
TIBC: 344 ug/dL (ref 250–450)
UIBC: 284 ug/dL

## 2017-07-23 LAB — FERRITIN: FERRITIN: 67 ng/mL (ref 11–307)

## 2017-07-23 LAB — VITAMIN B12: VITAMIN B 12: 374 pg/mL (ref 180–914)

## 2017-07-23 NOTE — Progress Notes (Signed)
Patient declined flu shot today. States she has an appointment with her PCP, Dr. Lodema Hong, on Thursday, 07/25/17, and will get it there.

## 2017-07-23 NOTE — Patient Instructions (Signed)
[facility] at [facility] Discharge Instructions  RECOMMENDATIONS MADE BY THE CONSULTANT AND ANY TEST RESULTS WILL BE SENT TO YOUR REFERRING PHYSICIAN.  You saw Dr. Zhou today.  Thank you for choosing [facility] to provide your oncology and hematology care.  To afford each patient quality time with our provider, please arrive at least 15 minutes before your scheduled appointment time.    If you have a lab appointment with the Cancer Center please come in thru the  Main Entrance and check in at the main information desk  You need to re-schedule your appointment should you arrive 10 or more minutes late.  We strive to give you quality time with our providers, and arriving late affects you and other patients whose appointments are after yours.  Also, if you no show three or more times for appointments you may be dismissed from the clinic at the providers discretion.     Again, thank you for choosing [facility].  Our hope is that these requests will decrease the amount of time that you wait before being seen by our physicians.       _____________________________________________________________  Should you have questions after your visit to  Cancer Center, please contact our office at [phone] between the hours of 8:30 a.m. and 4:30 p.m.  Voicemails left after 4:30 p.m. will not be returned until the following business day.  For prescription refill requests, have your pharmacy contact our office.       Resources For Cancer Patients and their Caregivers ? American Cancer Society: Can assist with transportation, wigs, general needs, runs Look Good Feel Better.        1-888-227-6333 ? Cancer Care: Provides financial assistance, online support groups, medication/co-pay assistance.  1-800-813-HOPE (4673) ? [facility] Assists [place] cancer patients and their families through  emotional , educational and financial support.  336-427-4357 ? [place] DSS Where to apply for food stamps, Medicaid and utility assistance. 336-342-1394 ? [facility]: Transportation to medical appointments. 336-347-2287 ? Social Security Administration: May apply for disability if have a Stage IV cancer. 336-342-7796 1-800-772-1213 ? [place] Aging, Disability and Transit Services: Assists with nutrition, care and transit needs. 336-349-2343  Cancer Center Support Programs: [email or web address]@ > Cancer Support Group  2nd Tuesday of the month 1pm-2pm, Journey Room  > Creative Journey  3rd Tuesday of the month 1130am-1pm, Journey Room  > Look Good Feel Better  1st Wednesday of the month 10am-12 noon, Journey Room (Call American Cancer Society to register 1-800-395-5775)    

## 2017-07-23 NOTE — Progress Notes (Signed)
[facility] Follow up:    Fayrene Helper, MD [address] [place] [address]   DIAGNOSIS: Normocytic anemia   INTERVAL HISTORY: Kimberly Freeman 76 y.o. female returns for evaluation of normocytic anemia. Anemia workup performed on her last visit on 04/03/17 demonstrated a hemoglobin of 10.9 g/dl, normal WBC and platelets. Folate, vitamin B12, and iron studies were all WNL.   She presents today for continued follow-up of her anemia. Since her last visit she's had an EGD performed on 07/18/2017 which demonstrated normocytic anemia due to gastritis and duodenitis due to chronic aspirin use and PRN PPI. Patient has stomach biopsies performed which demonstrated ulceration and reactive gastropathy, no H pylori was identified, negative for atypia or malignancy. She also had a colonoscopy performed on 07/18/2017 which demonstrated internal and external hemorrhoids, redundant left colon, but otherwise examination of the colon was normal. Patient states that overall she feels well and she has no complaints.  Patient Active Problem List   Diagnosis Date Noted  . Normocytic anemia due to blood loss   . History of colonic polyps 06/03/2017  . Chronic flank pain 03/25/2017  . Thoracic spine pain 03/25/2017  . Anemia 01/12/2017  . Sciatica, left side 04/11/2016  . Esophageal reflux   . Palpitations 12/13/2015  . Ganglion cyst of wrist 12/05/2015  . Overweight (BMI 25.0-29.9) 04/22/2015  . Nonspecific abnormal electrocardiogram (ECG) (EKG) 10/18/2014  . Stress at home 10/25/2013  . Arthritis 05/25/2011  . Diabetes mellitus, insulin dependent (IDDM), uncontrolled ([place]) 04/21/2008  . Hyperlipidemia LDL goal <100 04/21/2008  . Essential hypertension 04/21/2008    is allergic to poison oak extract [poison oak extract] and sulfonamide derivatives.  MEDICAL HISTORY: Past Medical History:  Diagnosis Date  . Diabetes mellitus, type 2 ([place])   . Essential  hypertension   . GERD (gastroesophageal reflux disease)   . Hyperlipidemia     SURGICAL HISTORY: Past Surgical History:  Procedure Laterality Date  . ABDOMINAL HYSTERECTOMY    . BIOPSY  07/18/2017   Procedure: BIOPSY;  Surgeon: Danie Binder, MD;  Location: [facility];  Service: Endoscopy;;  gastric  . CHOLECYSTECTOMY  2004  . COLONOSCOPY N/A 07/18/2017   Procedure: COLONOSCOPY;  Surgeon: Danie Binder, MD;  Location: [facility];  Service: Endoscopy;  Laterality: N/A;  830   . ESOPHAGOGASTRODUODENOSCOPY N/A 07/18/2017   Procedure: ESOPHAGOGASTRODUODENOSCOPY (EGD);  Surgeon: Danie Binder, MD;  Location: [facility];  Service: Endoscopy;  Laterality: N/A;    SOCIAL HISTORY: Social History   Social History  . Marital status: Married    Spouse name: N/A  . Number of children: N/A  . Years of education: N/A   Occupational History  . Not on file.   Social History Main Topics  . Smoking status: Never Smoker  . Smokeless tobacco: Never Used  . Alcohol use No  . Drug use: No  . Sexual activity: Not Currently    Partners: Male   Other Topics Concern  . Not on file   Social History Narrative  . No narrative on file    FAMILY HISTORY: Family History  Problem Relation Age of Onset  . Leukemia Mother   . Cancer Mother   . Prostate cancer Father   . Lung cancer Sister 75  . Diabetes Sister   . Colon cancer Neg Hx     Review of Systems  All other systems reviewed and are negative.  PHYSICAL EXAMINATION  ECOG PERFORMANCE STATUS: 0 - Asymptomatic  Vitals:   07/23/17 0930  BP: (!) 157/62  Pulse: 71  Resp: 18  Temp: 98 F (36.7 C)  SpO2: 99%    Physical Exam  Constitutional: She is oriented to person, place, and time and well-developed, well-nourished, and in no distress. No distress.  HENT:  Head: Normocephalic and atraumatic.  Mouth/Throat: No oropharyngeal exudate.  Eyes: Pupils are equal, round, and reactive to light. Conjunctivae are  normal. No scleral icterus.  Neck: Normal range of motion. Neck supple. No JVD present.  Cardiovascular: Normal rate, regular rhythm and normal heart sounds.  Exam reveals no gallop and no friction rub.   No murmur heard. Pulmonary/Chest: Breath sounds normal. No respiratory distress. She has no wheezes. She has no rales.  Abdominal: Soft. Bowel sounds are normal. She exhibits no distension. There is no tenderness. There is no guarding.  Musculoskeletal: She exhibits no edema or tenderness.  Lymphadenopathy:    She has no cervical adenopathy.  Neurological: She is alert and oriented to person, place, and time. No cranial nerve deficit.  Skin: Skin is warm and dry. No rash noted. No erythema. No pallor.  Psychiatric: Affect and judgment normal.    LABORATORY DATA:  CBC    Component Value Date/Time   WBC 5.9 07/23/2017 0939   RBC 4.09 07/23/2017 0939   HGB 12.0 07/23/2017 0939   HCT 37.0 07/23/2017 0939   PLT 255 07/23/2017 0939   MCV 90.5 07/23/2017 0939   MCH 29.3 07/23/2017 0939   MCHC 32.4 07/23/2017 0939   RDW 13.3 07/23/2017 0939   LYMPHSABS 2.2 07/23/2017 0939   MONOABS 0.5 07/23/2017 0939   EOSABS 0.3 07/23/2017 0939   BASOSABS 0.0 07/23/2017 0939    CMP     Component Value Date/Time   NA 138 04/02/2017 0902   K 4.0 04/02/2017 0902   CL 103 04/02/2017 0902   CO2 27 04/02/2017 0902   GLUCOSE 188 (H) 04/02/2017 0902   BUN 16 04/02/2017 0902   CREATININE 1.01 (H) 04/02/2017 0902   CREATININE 0.98 (H) 03/22/2017 0803   CALCIUM 9.3 04/02/2017 0902   PROT 7.3 04/02/2017 0902   ALBUMIN 3.6 04/02/2017 0902   AST 31 04/02/2017 0902   ALT 33 04/02/2017 0902   ALKPHOS 78 04/02/2017 0902   BILITOT 0.5 04/02/2017 0902   GFRNONAA 52 (L) 04/02/2017 0902   GFRNONAA 56 (L) 03/22/2017 0803   GFRAA >60 04/02/2017 0902   GFRAA 64 03/22/2017 0803      ASSESSMENT and THERAPY PLAN:  Normocytic Anemia due to duodenitis and gastritis from chronic aspirin  use  PLAN: Patient states she does have blood work performed for her PCP earlier this morning, we'll request a copy. I will also check for iron studies, B12, folate, SPEP/IFE to rule out multiple myeloma today. RTC in 4 months for follow up with labs, CBC, CMP.    Orders Placed This Encounter  Procedures  . Immunofixation electrophoresis    Standing Status:   Future    Number of Occurrences:   1    Standing Expiration Date:   07/23/2018  . Protein electrophoresis, serum    Standing Status:   Future    Number of Occurrences:   1    Standing Expiration Date:   07/23/2018  . Iron and TIBC    Standing Status:   Future    Number of Occurrences:   1    Standing Expiration Date:  07/23/2018  . Ferritin    Standing Status:   Future    Number of Occurrences:   1    Standing Expiration Date:   07/23/2018  . Vitamin B12    Standing Status:   Future    Number of Occurrences:   1    Standing Expiration Date:   07/23/2018  . Folate    Standing Status:   Future    Number of Occurrences:   1    Standing Expiration Date:   07/23/2018  . CBC with Differential    Standing Status:   Future    Standing Expiration Date:   07/23/2018  . Comprehensive metabolic panel    Standing Status:   Future    Standing Expiration Date:   07/23/2018    All questions were answered. The patient knows to call the clinic with any problems, questions or concerns. We can certainly see the patient much sooner if necessary.  This note was electronically signed. Twana First, MD 07/23/2017

## 2017-07-24 LAB — BASIC METABOLIC PANEL WITH GFR
BUN/Creatinine Ratio: 16 (calc) (ref 6–22)
BUN: 17 mg/dL (ref 7–25)
CALCIUM: 9.7 mg/dL (ref 8.6–10.4)
CO2: 26 mmol/L (ref 20–32)
Chloride: 106 mmol/L (ref 98–110)
Creat: 1.06 mg/dL — ABNORMAL HIGH (ref 0.60–0.93)
GFR, EST AFRICAN AMERICAN: 58 mL/min/{1.73_m2} — AB (ref >=60)
GFR, Est Non African American: 50 mL/min/{1.73_m2} — ABNORMAL LOW (ref >=60)
Glucose, Bld: 108 mg/dL — ABNORMAL HIGH (ref 65–99)
POTASSIUM: 4.3 mmol/L (ref 3.5–5.3)
Sodium: 142 mmol/L (ref 135–146)

## 2017-07-24 LAB — PROTEIN ELECTROPHORESIS, SERUM
A/G RATIO SPE: 1.1 (ref 0.7–1.7)
ALBUMIN ELP: 3.6 g/dL (ref 2.9–4.4)
Alpha-1-Globulin: 0.2 g/dL (ref 0.0–0.4)
Alpha-2-Globulin: 0.8 g/dL (ref 0.4–1.0)
BETA GLOBULIN: 1.3 g/dL (ref 0.7–1.3)
GLOBULIN, TOTAL: 3.4 g/dL (ref 2.2–3.9)
Gamma Globulin: 1.1 g/dL (ref 0.4–1.8)
TOTAL PROTEIN ELP: 7 g/dL (ref 6.0–8.5)

## 2017-07-24 LAB — HEMOGLOBIN A1C
Hgb A1c MFr Bld: 7 % of total Hgb — ABNORMAL HIGH (ref <5.7)
Mean Plasma Glucose: 154 (calc)
eAG (mmol/L): 8.5 (calc)

## 2017-07-25 ENCOUNTER — Ambulatory Visit (INDEPENDENT_AMBULATORY_CARE_PROVIDER_SITE_OTHER): Payer: Medicare Other | Admitting: Family Medicine

## 2017-07-25 VITALS — BP 122/70 | HR 67 | Temp 98.3°F | Resp 16 | Ht 67.0 in | Wt 190.5 lb

## 2017-07-25 DIAGNOSIS — Z23 Encounter for immunization: Secondary | ICD-10-CM | POA: Diagnosis not present

## 2017-07-25 DIAGNOSIS — M25511 Pain in right shoulder: Secondary | ICD-10-CM

## 2017-07-25 DIAGNOSIS — F439 Reaction to severe stress, unspecified: Secondary | ICD-10-CM

## 2017-07-25 DIAGNOSIS — E663 Overweight: Secondary | ICD-10-CM

## 2017-07-25 DIAGNOSIS — I10 Essential (primary) hypertension: Secondary | ICD-10-CM | POA: Diagnosis not present

## 2017-07-25 DIAGNOSIS — E785 Hyperlipidemia, unspecified: Secondary | ICD-10-CM | POA: Diagnosis not present

## 2017-07-25 DIAGNOSIS — E119 Type 2 diabetes mellitus without complications: Secondary | ICD-10-CM

## 2017-07-25 DIAGNOSIS — IMO0001 Reserved for inherently not codable concepts without codable children: Secondary | ICD-10-CM

## 2017-07-25 DIAGNOSIS — G8929 Other chronic pain: Secondary | ICD-10-CM | POA: Diagnosis not present

## 2017-07-25 DIAGNOSIS — Z794 Long term (current) use of insulin: Secondary | ICD-10-CM

## 2017-07-25 LAB — MULTIPLE MYELOMA PANEL, SERUM
ALBUMIN SERPL ELPH-MCNC: 3.5 g/dL (ref 2.9–4.4)
Albumin/Glob SerPl: 1.1 (ref 0.7–1.7)
Alpha 1: 0.2 g/dL (ref 0.0–0.4)
Alpha2 Glob SerPl Elph-Mcnc: 0.8 g/dL (ref 0.4–1.0)
B-GLOBULIN SERPL ELPH-MCNC: 1.3 g/dL (ref 0.7–1.3)
GAMMA GLOB SERPL ELPH-MCNC: 1.2 g/dL (ref 0.4–1.8)
GLOBULIN, TOTAL: 3.4 g/dL (ref 2.2–3.9)
IgA: 281 mg/dL (ref 64–422)
IgG (Immunoglobin G), Serum: 1113 mg/dL (ref 700–1600)
IgM (Immunoglobulin M), Srm: 23 mg/dL — ABNORMAL LOW (ref 26–217)
Total Protein ELP: 6.9 g/dL (ref 6.0–8.5)

## 2017-07-25 LAB — IMMUNOFIXATION ELECTROPHORESIS
IGA: 286 mg/dL (ref 64–422)
IGM (IMMUNOGLOBULIN M), SRM: 23 mg/dL — AB (ref 26–217)
IgG (Immunoglobin G), Serum: 1065 mg/dL (ref 700–1600)
Total Protein ELP: 6.8 g/dL (ref 6.0–8.5)

## 2017-07-25 LAB — HEMOGLOBINOPATHY EVALUATION
HGB S QUANTITAION: 0 %
Hgb A2 Quant: 2.1 % (ref 1.8–3.2)
Hgb A: 97.9 % (ref 96.4–98.8)
Hgb C: 0 %
Hgb F Quant: 0 % (ref 0.0–2.0)
Hgb Variant: 0 %

## 2017-07-25 MED ORDER — ONETOUCH ULTRASOFT LANCETS MISC: 1 refills | Status: DC

## 2017-07-25 MED ORDER — BASAGLAR KWIKPEN 100 UNIT/ML ~~LOC~~ SOPN: 50.0000 [IU] | PEN_INJECTOR | Freq: Every day | SUBCUTANEOUS | 1 refills | Status: DC

## 2017-07-25 MED ORDER — GLUCOSE BLOOD VI STRP: ORAL_STRIP | 1 refills | Status: DC

## 2017-07-25 NOTE — Patient Instructions (Signed)
F/u in 4 months, call if you need me before  Congratulations on marked improvement of your blood sugar  We will send for our eye exam  Flu vaccine today  Fasting lipid, cmp and EGFR and HBa1C 3 to 5 days before next visit  Foot exam today

## 2017-07-26 ENCOUNTER — Encounter: Payer: Self-pay | Admitting: Family Medicine

## 2017-07-26 NOTE — Assessment & Plan Note (Signed)
Controlled, no change in medication DASH diet and commitment to daily physical activity for a minimum of 30 minutes discussed and encouraged, as a part of hypertension management. The importance of attaining a healthy weight is also discussed.  BP/Weight 07/25/2017 07/23/2017 07/18/2017 06/03/2017 04/22/2017 04/02/2017 03/25/2017  Systolic BP 122 157 128 136 149 155 138  Diastolic BP 70 62 67 77 59 66 70  Wt. (Lbs) 190.5 190.4 189 189.6 191 191 192  BMI 29.84 29.82 29.6 29.7 29.91 29.91 30.07

## 2017-07-26 NOTE — Assessment & Plan Note (Signed)
Unchanged Patient re-educated about  the importance of commitment to a  minimum of 150 minutes of exercise per week.  The importance of healthy food choices with portion control discussed. Encouraged to start a food diary, count calories and to consider  joining a support group. Sample diet sheets offered. Goals set by the patient for the next several months.   Weight /BMI 07/25/2017 07/23/2017 07/18/2017  WEIGHT 190 lb 8 oz 190 lb 6.4 oz 189 lb  HEIGHT  -   BMI 29.84 kg/m2 29.82 kg/m2 29.6 kg/m2

## 2017-07-26 NOTE — Assessment & Plan Note (Signed)
Unchanged, but pt coping better with her spouse with dementia

## 2017-07-26 NOTE — Progress Notes (Signed)
Kimberly Freeman     MRN: [id number]      DOB: Feb 25, 1941   HPI Kimberly Freeman is here for follow up and re-evaluation of chronic medical conditions, medication management and review of any available recent lab and radiology data.  Preventive health is updated, specifically  Cancer screening and Immunization.   Questions or concerns regarding consultations or procedures which the PT has had in the interim are  addressed. The PT denies any adverse reactions to current medications since the last visit.  There are no new concerns.  Ongoing right shoulder pain and limitation in mobility, does not want to pursue further treatment at this time Denies polyuria, polydipsia, blurred vision , or hypoglycemic episodes. Reports improved blood sugars, has also improved exercise commitment  ROS Denies recent fever or chills. Denies sinus pressure, nasal congestion, ear pain or sore throat. Denies chest congestion, productive cough or wheezing. Denies chest pains, palpitations and leg swelling Denies abdominal pain, nausea, vomiting,diarrhea or constipation.   Denies dysuria, frequency, hesitancy or incontinence.  Denies headaches, seizures, numbness, or tingling. Denies depression, anxiety or insomnia. Denies skin break down or rash.   PE  BP 122/70 (BP Location: Left Arm, Patient Position: Sitting, Cuff Size: Normal)   Pulse 67   Temp 98.3 F (36.8 C) (Other (Comment))   Resp 16   Ht  (1.702 m)   Wt 190 lb 8 oz (86.4 kg)   SpO2 97%   BMI 29.84 kg/m   Patient alert and oriented and in no cardiopulmonary distress.  HEENT: No facial asymmetry, EOMI,   oropharynx pink and moist.  Neck supple no JVD, no mass.  Chest: Clear to auscultation bilaterally.  CVS: S1, S2 no murmurs, no S3.Regular rate.  ABD: Soft non tender.   Ext: No edema  MS: Adequate ROM spine, , hips and knees.Markedly reduced ROM right shoulder  Skin: Intact, no ulcerations or rash noted.  Psych: Good eye  contact, normal affect. Memory intact not anxious or depressed appearing.  CNS: CN 2-12 intact, power,  normal throughout.no focal deficits noted.   Assessment & Plan  Essential hypertension Controlled, no change in medication DASH diet and commitment to daily physical activity for a minimum of 30 minutes discussed and encouraged, as a part of hypertension management. The importance of attaining a healthy weight is also discussed.  BP/Weight 07/25/2017 07/23/2017 07/18/2017 06/03/2017 04/22/2017 04/02/2017 03/25/2017  Systolic BP 122 157 128 136 149 155 138  Diastolic BP 70 62 67 77 59 66 70  Wt. (Lbs) 190.5 190.4 189 189.6 191 191 192  BMI 29.84 29.82 29.6 29.7 29.91 29.91 30.07       Diabetes mellitus, insulin dependent (IDDM), uncontrolled (HCC) Markedly improved and now well controled, pt applauded on this Kimberly Freeman is reminded of the importance of commitment to daily physical activity for 30 minutes or more, as able and the need to limit carbohydrate intake to 30 to 60 grams per meal to help with blood sugar control.   The need to take medication as prescribed, test blood sugar as directed, and to call between visits if there is a concern that blood sugar is uncontrolled is also discussed.   Kimberly Freeman is reminded of the importance of daily foot exam, annual eye examination, and good blood sugar, blood pressure and cholesterol control.  Diabetic Labs Latest Ref Rng & Units 07/23/2017 07/23/2017 04/02/2017 03/22/2017 01/02/2017  HbA1c <5.7 % of total Hgb 7.0(H) - - 7.9(H) 8.5(H)  Microalbumin <2.0 mg/dL - - - - -  Micro/Creat Ratio 0.0 - 30.0 mg/g - - - - -  Chol <200 mg/dL - - - 409 -  HDL >81 mg/dL - - - 51 -  Calc LDL <191 mg/dL - - - 78 -  Triglycerides <150 mg/dL - - - 478 -  Creatinine 0.44 - 1.00 mg/dL 2.95(A) 2.13(Y) 8.65(H) 0.98(H) 0.94(H)   BP/Weight 07/25/2017 07/23/2017 07/18/2017 06/03/2017 04/22/2017 04/02/2017 03/25/2017  Systolic BP 122 157 128 136 149 155 138  Diastolic BP  70 62 67 77 59 66 70  Wt. (Lbs) 190.5 190.4 189 189.6 191 191 192  BMI 29.84 29.82 29.6 29.7 29.91 29.91 30.07   Foot/eye exam completion dates Latest Ref Rng & Units 07/25/2017 04/11/2016  Eye Exam No Retinopathy - -  Foot exam Order - - -  Foot Form Completion - Done Done        Hyperlipidemia LDL goal <100 Hyperlipidemia:Low fat diet discussed and encouraged.   Lipid Panel  Lab Results  Component Value Date   CHOL 150 03/22/2017   HDL 51 03/22/2017   LDLCALC 78 03/22/2017   TRIG 104 03/22/2017   CHOLHDL 2.9 03/22/2017   Controlled, no change in medication Updated lab needed at/ before next visit.    Overweight (BMI 25.0-29.9) Unchanged Patient re-educated about  the importance of commitment to a  minimum of 150 minutes of exercise per week.  The importance of healthy food choices with portion control discussed. Encouraged to start a food diary, count calories and to consider  joining a support group. Sample diet sheets offered. Goals set by the patient for the next several months.   Weight /BMI 07/25/2017 07/23/2017 07/18/2017  WEIGHT 190 lb 8 oz 190 lb 6.4 oz 189 lb  HEIGHT  -   BMI 29.84 kg/m2 29.82 kg/m2 29.6 kg/m2      Stress at home Unchanged, but pt coping better with her spouse with dementia  Chronic right shoulder pain Unchnaged, no interest in PT or ortho evaluation at this time

## 2017-07-26 NOTE — Assessment & Plan Note (Signed)
Markedly improved and now well controled, pt applauded on this Kimberly Freeman is reminded of the importance of commitment to daily physical activity for 30 minutes or more, as able and the need to limit carbohydrate intake to 30 to 60 grams per meal to help with blood sugar control.   The need to take medication as prescribed, test blood sugar as directed, and to call between visits if there is a concern that blood sugar is uncontrolled is also discussed.   Kimberly Freeman is reminded of the importance of daily foot exam, annual eye examination, and good blood sugar, blood pressure and cholesterol control.  Diabetic Labs Latest Ref Rng & Units 07/23/2017 07/23/2017 04/02/2017 03/22/2017 01/02/2017  HbA1c <5.7 % of total Hgb 7.0(H) - - 7.9(H) 8.5(H)  Microalbumin <2.0 mg/dL - - - - -  Micro/Creat Ratio 0.0 - 30.0 mg/g - - - - -  Chol <200 mg/dL - - - 409 -  HDL >81 mg/dL - - - 51 -  Calc LDL <191 mg/dL - - - 78 -  Triglycerides <150 mg/dL - - - 478 -  Creatinine 0.44 - 1.00 mg/dL 2.95(A) 2.13(Y) 8.65(H) 0.98(H) 0.94(H)   BP/Weight 07/25/2017 07/23/2017 07/18/2017 06/03/2017 04/22/2017 04/02/2017 03/25/2017  Systolic BP 122 157 128 136 149 155 138  Diastolic BP 70 62 67 77 59 66 70  Wt. (Lbs) 190.5 190.4 189 189.6 191 191 192  BMI 29.84 29.82 29.6 29.7 29.91 29.91 30.07   Foot/eye exam completion dates Latest Ref Rng & Units 07/25/2017 04/11/2016  Eye Exam No Retinopathy - -  Foot exam Order - - -  Foot Form Completion - Done Done

## 2017-07-26 NOTE — Assessment & Plan Note (Signed)
Hyperlipidemia:Low fat diet discussed and encouraged.   Lipid Panel  Lab Results  Component Value Date   CHOL 150 03/22/2017   HDL 51 03/22/2017   LDLCALC 78 03/22/2017   TRIG 104 03/22/2017   CHOLHDL 2.9 03/22/2017   Controlled, no change in medication Updated lab needed at/ before next visit.

## 2017-07-26 NOTE — Assessment & Plan Note (Signed)
Unchnaged, no interest in PT or ortho evaluation at this time

## 2017-07-31 ENCOUNTER — Telehealth: Payer: Self-pay | Admitting: Gastroenterology

## 2017-07-31 NOTE — Telephone Encounter (Signed)
Please call pt. HER stomach Bx shows gastritis DUE TO ASPIRIN USE.   DRINK WATER TO KEEP YOUR URINE LIGHT YELLOW. FOLLOW A HIGH FIBER/LOW FAT DIET. AVOID ITEMS THAT CAUSE BLOATING.  TO PREVENT GASTRITIS AND DUODENITIS, ADD PROTONIX EVERY DAY. TAKE 30 MINUTES PRIOR TO BREAKFAST.  FOLLOW UP  Jan 2nd at 1:30 pm

## 2017-08-05 NOTE — Telephone Encounter (Signed)
Pt is aware.  

## 2017-08-12 ENCOUNTER — Ambulatory Visit (HOSPITAL_COMMUNITY)
Admission: RE | Admit: 2017-08-12 | Discharge: 2017-08-12 | Disposition: A | Discharge status: Home / Self Care | Payer: Medicare Other | Source: Ambulatory Visit | Attending: Family Medicine | Admitting: Family Medicine

## 2017-08-12 DIAGNOSIS — Z1231 Encounter for screening mammogram for malignant neoplasm of breast: Secondary | ICD-10-CM

## 2017-08-15 ENCOUNTER — Telehealth: Payer: Self-pay | Admitting: Gastroenterology

## 2017-08-15 NOTE — Telephone Encounter (Signed)
[phone] patient stated that the prescription of pantoprozole needs to be [facility].  She has some left but a refill needs to be sent there.

## 2017-08-16 MED ORDER — PANTOPRAZOLE SODIUM 40 MG PO TBEC: DELAYED_RELEASE_TABLET | ORAL | 3 refills | Status: DC

## 2017-08-16 NOTE — Telephone Encounter (Signed)
Routing to the refill box. 

## 2017-08-16 NOTE — Addendum Note (Signed)
Addended by: Delane GingerGILL, ERIC A on: 08/16/2017 03:46 PM   Modules accepted: Orders

## 2017-08-16 NOTE — Telephone Encounter (Signed)
Please tell the patient the [facility] as requested.

## 2017-08-23 ENCOUNTER — Other Ambulatory Visit: Payer: Self-pay

## 2017-08-23 MED ORDER — METFORMIN HCL ER 500 MG PO TB24
1000.0000 mg | ORAL_TABLET | Freq: Two times a day (BID) | ORAL | 1 refills | Status: DC
Start: 2017-08-23 — End: 2018-01-27

## 2017-08-27 ENCOUNTER — Other Ambulatory Visit: Payer: Self-pay

## 2017-08-27 MED ORDER — GLIPIZIDE ER 5 MG PO TB24: 5.0000 mg | ORAL_TABLET | Freq: Every day | ORAL | 1 refills | Status: DC

## 2017-08-27 MED ORDER — ATORVASTATIN CALCIUM 40 MG PO TABS: 40.0000 mg | ORAL_TABLET | Freq: Every day | ORAL | 1 refills | Status: DC

## 2017-08-27 MED ORDER — SPIRONOLACTONE 25 MG PO TABS: 25.0000 mg | ORAL_TABLET | Freq: Every day | ORAL | 1 refills | Status: DC

## 2017-09-03 DIAGNOSIS — E1142 Type 2 diabetes mellitus with diabetic polyneuropathy: Secondary | ICD-10-CM | POA: Diagnosis not present

## 2017-09-03 DIAGNOSIS — B351 Tinea unguium: Secondary | ICD-10-CM | POA: Diagnosis not present

## 2017-09-18 ENCOUNTER — Ambulatory Visit (INDEPENDENT_AMBULATORY_CARE_PROVIDER_SITE_OTHER): Payer: Medicare Other

## 2017-09-18 VITALS — BP 148/82 | HR 64 | Temp 97.3°F | Resp 16 | Ht 67.0 in | Wt 194.1 lb

## 2017-09-18 DIAGNOSIS — Z0000 Encounter for general adult medical examination without abnormal findings: Secondary | ICD-10-CM | POA: Diagnosis not present

## 2017-09-18 NOTE — Progress Notes (Addendum)
Subjective:   Kimberly Freeman is a 76 y.o. female who presents for Medicare Annual (Subsequent) preventive examination.  Review of Systems:   Cardiac Risk Factors include: diabetes mellitus;hypertension     Objective:     Vitals: BP (!) 148/82 (BP Location: Right Arm, Patient Position: Sitting, Cuff Size: Normal)   Pulse 64   Temp (!) 97.3 F (36.3 C) (Temporal)   Resp 16   Ht 5\' 7"  (1.702 m)   Wt 194 lb 1.9 oz (88.1 kg)   SpO2 99%   BMI 30.40 kg/m   Body mass index is 30.4 kg/m.   Tobacco Social History   Tobacco Use  Smoking Status Never Smoker  Smokeless Tobacco Never Used     Counseling given: Not Answered   Past Medical History:  Diagnosis Date  . Diabetes mellitus, type 2 (HCC)   . Essential hypertension   . GERD (gastroesophageal reflux disease)   . Hyperlipidemia    Past Surgical History:  Procedure Laterality Date  . ABDOMINAL HYSTERECTOMY    . BIOPSY  07/18/2017   Procedure: BIOPSY;  Surgeon: West BaliFields, Sandi L, MD;  Location: [facility];  Service: Endoscopy;;  gastric  . CHOLECYSTECTOMY  2004  . COLONOSCOPY N/A 07/18/2017   Procedure: COLONOSCOPY;  Surgeon: West BaliFields, Sandi L, MD;  Location: [facility];  Service: Endoscopy;  Laterality: N/A;  830   . ESOPHAGOGASTRODUODENOSCOPY N/A 07/18/2017   Procedure: ESOPHAGOGASTRODUODENOSCOPY (EGD);  Surgeon: West BaliFields, Sandi L, MD;  Location: [facility];  Service: Endoscopy;  Laterality: N/A;   Family History  Problem Relation Age of Onset  . Leukemia Mother   . Cancer Mother   . Prostate cancer Father   . Lung cancer Sister 352  . Diabetes Sister   . Colon cancer Neg Hx    Social History   Substance and Sexual Activity  Sexual Activity Not Currently  . Partners: Male    Outpatient Encounter Medications as of 09/18/2017  Medication Sig  . acetaminophen (TYLENOL) 500 MG tablet Take 500 mg by mouth daily as needed for mild pain or moderate pain. For pain   . aspirin 325 MG tablet Take 325 mg by  mouth every morning.  Marland Kitchen. atorvastatin (LIPITOR) 40 MG tablet Take 1 tablet (40 mg total) at bedtime by mouth.  Marland Kitchen. glipiZIDE (GLUCOTROL XL) 5 MG 24 hr tablet Take 1 tablet (5 mg total) daily by mouth.  Marland Kitchen. glucose blood test strip (Onetouch Ultrablue ) Use as instructed for twice daily testing  . Insulin Glargine (BASAGLAR KWIKPEN) 100 UNIT/ML SOPN Inject 0.5 mLs (50 Units total) into the skin at bedtime.  . Insulin Pen Needle 32G X 6 MM MISC Use to inject insulin daily dx e11.65  . irbesartan (AVAPRO) 300 MG tablet Take 1 tablet (300 mg total) by mouth daily.  . Lancets (ONETOUCH ULTRASOFT) lancets USE AS DIRECTED TWICE DAILY  . metFORMIN (GLUCOPHAGE-XR) 500 MG 24 hr tablet Take 2 tablets (1,000 mg total) by mouth 2 (two) times daily.  . metoprolol tartrate (LOPRESSOR) 50 MG tablet TAKE 1 TABLET BY MOUTH TWICE DAILY  . Multiple Vitamin (MULTIVITAMIN WITH MINERALS) TABS tablet Take 1 tablet by mouth daily.  . nitroGLYCERIN (NITROSTAT) 0.4 MG SL tablet Place 1 tablet (0.4 mg total) under the tongue every 5 (five) minutes as needed for chest pain.  . pantoprazole (PROTONIX) 40 MG tablet 1 po 30 min before first meal  . spironolactone (ALDACTONE) 25 MG tablet Take 1 tablet (25 mg total) daily by  mouth.  . [DISCONTINUED] traMADol (ULTRAM) 50 MG tablet One tablet  at bedtime as needed, for pain (Patient not taking: Reported on 09/18/2017)   No facility-administered encounter medications on file as of 09/18/2017.     Activities of Daily Living In your present state of health, do you have any difficulty performing the following activities: 09/18/2017 10/04/2016  Hearing? N N  Vision? N N  Difficulty concentrating or making decisions? N N  Walking or climbing stairs? N N  Dressing or bathing? N N  Doing errands, shopping? N N  Preparing Food and eating ? N N  Using the Toilet? N N  In the past six months, have you accidently leaked urine? N N  Do you have problems with loss of bowel control? N N    Managing your Medications? N N  Managing your Finances? N N  Housekeeping or managing your Housekeeping? N N  Some recent data might be hidden    Patient Care Team: Kerri PerchesSimpson, Margaret E, MD as PCP - General Laqueta LindenKoneswaran, Suresh A, MD as Attending Physician (Cardiology) Ferman HammingMcKinney, Benjamin, DPM as Consulting Physician (Podiatry) West BaliFields, Sandi L, MD as Consulting Physician (Gastroenterology)    Assessment:     Exercise Activities and Dietary recommendations Current Exercise Habits: Home exercise routine, Type of exercise: walking;Other - see comments, Time (Minutes): 40, Frequency (Times/Week): 5, Weekly Exercise (Minutes/Week): 200, Intensity: Moderate, Exercise limited by: None identified  Goals    . Exercise 5x per week (60 min per time)     Starting 10/04/2016 patient would like to start back on her exercise bicycle 5 times a week for 60 minutes at a time.      Fall Risk Fall Risk  09/18/2017 10/04/2016 04/11/2016 11/24/2015 10/03/2015  Falls in the past year? No No No No No  Risk for fall due to : - Impaired vision - - -   Depression Screen PHQ 2/9 Scores 09/18/2017 10/04/2016 03/15/2015 09/22/2013  PHQ - 2 Score 0 0 0 0  PHQ- 9 Score - - - 2     Cognitive Function     6CIT Screen 09/18/2017 10/04/2016  What Year? 0 points 0 points  What month? 0 points 0 points  What time? 0 points 0 points  Count back from 20 0 points 0 points  Months in reverse 0 points 0 points  Repeat phrase 0 points 0 points  Total Score 0 0    Immunization History  Administered Date(s) Administered  . Influenza Split 08/01/2011, 07/31/2012  . Influenza Whole 07/29/2007, 07/20/2008, 07/21/2009, 07/19/2010  . Influenza,inj,Quad PF,6+ Mos 08/07/2013, 06/15/2014, 07/19/2015, 06/28/2016, 07/25/2017  . Pneumococcal Conjugate-13 10/12/2014  . Pneumococcal Polysaccharide-23 03/16/2004, 08/29/2012  . Td 03/16/2004  . Tdap 03/31/2012  . Zoster 01/02/2007   Screening Tests Health Maintenance   Topic Date Due  . OPHTHALMOLOGY EXAM  05/19/2015  . HEMOGLOBIN A1C  01/21/2018  . FOOT EXAM  07/26/2018  . TETANUS/TDAP  03/31/2022  . INFLUENZA VACCINE  Completed  . DEXA SCAN  Completed  . PNA vac Low Risk Adult  Completed      Plan:     I have personally reviewed and noted the following in the patient's chart:   . Medical and social history . Use of alcohol, tobacco or illicit drugs  . Current medications and supplements . Functional ability and status . Nutritional status . Physical activity . Advanced directives . List of other physicians . Hospitalizations, surgeries, and ER visits in previous 12 months . Vitals .  Screenings to include cognitive, depression, and falls . Referrals and appointments  In addition, I have reviewed and discussed with patient certain preventive protocols, quality metrics, and best practice recommendations. A written personalized care plan for preventive services as well as general preventive health recommendations were provided to patient.     Crawford Givens, LPN  16/07/[id number]

## 2017-09-18 NOTE — Patient Instructions (Signed)
[name] , Thank you for taking time to come for your Medicare Wellness Visit. I appreciate your ongoing commitment to your health goals. Please review the following plan we discussed and let me know if I can assist you in the future.   Screening recommendations/referrals: Colonoscopy:  Mammogram:  Bone Density:  Recommended yearly ophthalmology/optometry visit for glaucoma screening and checkup Recommended yearly dental visit for hygiene and checkup  Vaccinations: Influenza vaccine:  Pneumococcal vaccine:  Tdap vaccine:  Shingles vaccine:   Advanced directives:   Conditions/risks identified:   Next appointment:    Preventive Care 65 Years and Older, Female Preventive care refers to lifestyle choices and visits with your health care provider that can promote health and wellness. What does preventive care include?  A yearly physical exam. This is also called an annual well check.  Dental exams once or twice a year.  Routine eye exams. Ask your health care provider how often you should have your eyes checked.  Personal lifestyle choices, including:  Daily care of your teeth and gums.  Regular physical activity.  Eating a healthy diet.  Avoiding tobacco and drug use.  Limiting alcohol use.  Practicing safe sex.  Taking low-dose aspirin every day.  Taking vitamin and mineral supplements as recommended by your health care provider. What happens during an annual well check? The services and screenings done by your health care provider during your annual well check will depend on your age, overall health, lifestyle risk factors, and family history of disease. Counseling  Your health care provider may ask you questions about your:  Alcohol use.  Tobacco use.  Drug use.  Emotional well-being.  Home and relationship well-being.  Sexual activity.  Eating habits.  History of falls.  Memory and ability to understand (cognition).  Work and work  Astronomerenvironment.  Reproductive health. Screening  You may have the following tests or measurements:  Height, weight, and BMI.  Blood pressure.  Lipid and cholesterol levels. These may be checked every 5 years, or more frequently if you are over 78 years old.  Skin check.  Lung cancer screening. You may have this screening every year starting at age 78 if you have a 30-pack-year history of smoking and currently smoke or have quit within the past 15 years.  Fecal occult blood test (FOBT) of the stool. You may have this test every year starting at age 950.  Flexible sigmoidoscopy or colonoscopy. You may have a sigmoidoscopy every 5 years or a colonoscopy every 10 years starting at age 78.  Hepatitis C blood test.  Hepatitis B blood test.  Sexually transmitted disease (STD) testing.  Diabetes screening. This is done by checking your blood sugar (glucose) after you have not eaten for a while (fasting). You may have this done every 1-3 years.  Bone density scan. This is done to screen for osteoporosis. You may have this done starting at age 78.  Mammogram. This may be done every 1-2 years. Talk to your health care provider about how often you should have regular mammograms. Talk with your health care provider about your test results, treatment options, and if necessary, the need for more tests. Vaccines  Your health care provider may recommend certain vaccines, such as:  Influenza vaccine. This is recommended every year.  Tetanus, diphtheria, and acellular pertussis (Tdap, Td) vaccine. You may need a Td booster every 10 years.  Zoster vaccine. You may need this after age 360.  Pneumococcal 13-valent conjugate (PCV13) vaccine. One dose is  recommended after age 80.  Pneumococcal polysaccharide (PPSV23) vaccine. One dose is recommended after age 49. Talk to your health care provider about which screenings and vaccines you need and how often you need them. This information is not  intended to replace advice given to you by your health care provider. Make sure you discuss any questions you have with your health care provider. Document Released: 11/04/2015 Document Revised: 06/27/2016 Document Reviewed: 08/09/2015 Elsevier Interactive Patient Education  2017 [employer].  Fall Prevention in the Home Falls can cause injuries. They can happen to people of all ages. There are many things you can do to make your home safe and to help prevent falls. What can I do on the outside of my home?  Regularly fix the edges of walkways and driveways and fix any cracks.  Remove anything that might make you trip as you walk through a door, such as a raised step or threshold.  Trim any bushes or trees on the path to your home.  Use bright outdoor lighting.  Clear any walking paths of anything that might make someone trip, such as rocks or tools.  Regularly check to see if handrails are loose or broken. Make sure that both sides of any steps have handrails.  Any raised decks and porches should have guardrails on the edges.  Have any leaves, snow, or ice cleared regularly.  Use sand or salt on walking paths during winter.  Clean up any spills in your garage right away. This includes oil or grease spills. What can I do in the bathroom?  Use night lights.  Install grab bars by the toilet and in the tub and shower. Do not use towel bars as grab bars.  Use non-skid mats or decals in the tub or shower.  If you need to sit down in the shower, use a plastic, non-slip stool.  Keep the floor dry. Clean up any water that spills on the floor as soon as it happens.  Remove soap buildup in the tub or shower regularly.  Attach bath mats securely with double-sided non-slip rug tape.  Do not have throw rugs and other things on the floor that can make you trip. What can I do in the bedroom?  Use night lights.  Make sure that you have a light by your bed that is easy to reach.  Do  not use any sheets or blankets that are too big for your bed. They should not hang down onto the floor.  Have a firm chair that has side arms. You can use this for support while you get dressed.  Do not have throw rugs and other things on the floor that can make you trip. What can I do in the kitchen?  Clean up any spills right away.  Avoid walking on wet floors.  Keep items that you use a lot in easy-to-reach places.  If you need to reach something above you, use a strong step stool that has a grab bar.  Keep electrical cords out of the way.  Do not use floor polish or wax that makes floors slippery. If you must use wax, use non-skid floor wax.  Do not have throw rugs and other things on the floor that can make you trip. What can I do with my stairs?  Do not leave any items on the stairs.  Make sure that there are handrails on both sides of the stairs and use them. Fix handrails that are broken or loose. Make  sure that handrails are as long as the stairways.  Check any carpeting to make sure that it is firmly attached to the stairs. Fix any carpet that is loose or worn.  Avoid having throw rugs at the top or bottom of the stairs. If you do have throw rugs, attach them to the floor with carpet tape.  Make sure that you have a light switch at the top of the stairs and the bottom of the stairs. If you do not have them, ask someone to add them for you. What else can I do to help prevent falls?  Wear shoes that:  Do not have high heels.  Have rubber bottoms.  Are comfortable and fit you well.  Are closed at the toe. Do not wear sandals.  If you use a stepladder:  Make sure that it is fully opened. Do not climb a closed stepladder.  Make sure that both sides of the stepladder are locked into place.  Ask someone to hold it for you, if possible.  Clearly mark and make sure that you can see:  Any grab bars or handrails.  First and last steps.  Where the edge of each  step is.  Use tools that help you move around (mobility aids) if they are needed. These include:  Canes.  Walkers.  Scooters.  Crutches.  Turn on the lights when you go into a dark area. Replace any light bulbs as soon as they burn out.  Set up your furniture so you have a clear path. Avoid moving your furniture around.  If any of your floors are uneven, fix them.  If there are any pets around you, be aware of where they are.  Review your medicines with your doctor. Some medicines can make you feel dizzy. This can increase your chance of falling. Ask your doctor what other things that you can do to help prevent falls. This information is not intended to replace advice given to you by your health care provider. Make sure you discuss any questions you have with your health care provider. Document Released: 08/04/2009 Document Revised: 03/15/2016 Document Reviewed: 11/12/2014 Elsevier Interactive Patient Education  2017 [employer]  Preventive Care for Adults  A healthy lifestyle and preventive care can promote health and wellness. Preventive health guidelines for adults include the following key practices.  . A routine yearly physical is a good way to check with your health care provider about your health and preventive screening. It is a chance to share any concerns and updates on your health and to receive a thorough exam.  . Visit your dentist for a routine exam and preventive care every 6 months. Brush your teeth twice a day and floss once a day. Good oral hygiene prevents tooth decay and gum disease.  . The frequency of eye exams is based on your age, health, family medical history, use  of contact lenses, and other factors. Follow your health care provider's ecommendations for frequency of eye exams.  . Eat a healthy diet. Foods like vegetables, fruits, whole grains, low-fat dairy products, and lean protein foods contain the nutrients you need without too many calories.  Decrease your intake of foods high in solid fats, added sugars, and salt. Eat the right amount of calories for you. Get information about a proper diet from your health care provider, if necessary.  . Regular physical exercise is one of the most important things you can do for your health. Most adults should get at least 150  minutes of moderate-intensity exercise (any activity that increases your heart rate and causes you to sweat) each week. In addition, most adults need muscle-strengthening exercises on 2 or more days a week.  Silver Sneakers may be a benefit available to you. To determine eligibility, you may visit the website: www.silversneakers.com or contact program at [phone]) [phone] 699 1890 Mon-Fri between 8AM-8PM.   . Maintain a healthy weight. The body mass index (BMI) is a screening tool to identify possible weight problems. It provides an estimate of body fat based on height and weight. Your health care provider can find your BMI and can help you achieve or maintain a healthy weight.   For adults 20 years and older: ? A BMI below 18.5 is considered underweight. ? A BMI of 18.5 to 24.9 is normal. ? A BMI of 25 to 29.9 is considered overweight. ? A BMI of 30 and above is considered obese.   . Maintain normal blood lipids and cholesterol levels by exercising and minimizing your intake of saturated fat. Eat a balanced diet with plenty of fruit and vegetables. Blood tests for lipids and cholesterol should begin at age 10820 and be repeated every 5 years. If your lipid or cholesterol levels are high, you are over 50, or you are at high risk for heart disease, you may need your cholesterol levels checked more frequently. Ongoing high lipid and cholesterol levels should be treated with medicines if diet and exercise are not working.  . If you smoke, find out from your health care provider how to quit. If you do not use tobacco, please do not start.  . If you choose to drink alcohol, please do not consume  more than 2 drinks per day. One drink is considered to be 12 ounces (355 mL) of beer, 5 ounces (148 mL) of wine, or 1.5 ounces (44 mL) of liquor.  . If you are 5755-78 years old, ask your health care provider if you should take aspirin to prevent strokes.  . Use sunscreen. Apply sunscreen liberally and repeatedly throughout the day. You should seek shade when your shadow is shorter than you. Protect yourself by wearing long sleeves, pants, a wide-brimmed hat, and sunglasses year round, whenever you are outdoors.  . Once a month, do a whole body skin exam, using a mirror to look at the skin on your back. Tell your health care provider of new moles, moles that have irregular borders, moles that are larger than a pencil eraser, or moles that have changed in shape or color.

## 2017-10-23 ENCOUNTER — Ambulatory Visit: Payer: Medicare Other | Admitting: Gastroenterology

## 2017-11-12 DIAGNOSIS — B351 Tinea unguium: Secondary | ICD-10-CM | POA: Diagnosis not present

## 2017-11-12 DIAGNOSIS — E1142 Type 2 diabetes mellitus with diabetic polyneuropathy: Secondary | ICD-10-CM | POA: Diagnosis not present

## 2017-11-21 ENCOUNTER — Other Ambulatory Visit (HOSPITAL_COMMUNITY): Payer: Self-pay | Admitting: *Deleted

## 2017-11-21 DIAGNOSIS — D508 Other iron deficiency anemias: Secondary | ICD-10-CM

## 2017-11-22 ENCOUNTER — Ambulatory Visit (INDEPENDENT_AMBULATORY_CARE_PROVIDER_SITE_OTHER): Payer: Medicare Other | Admitting: Gastroenterology

## 2017-11-22 ENCOUNTER — Encounter: Payer: Self-pay | Admitting: Gastroenterology

## 2017-11-22 ENCOUNTER — Inpatient Hospital Stay (HOSPITAL_COMMUNITY): Payer: Medicare Other | Attending: Oncology

## 2017-11-22 VITALS — BP 150/77 | HR 77 | Temp 97.0°F | Ht 67.0 in | Wt 190.4 lb

## 2017-11-22 DIAGNOSIS — R7989 Other specified abnormal findings of blood chemistry: Secondary | ICD-10-CM | POA: Insufficient documentation

## 2017-11-22 DIAGNOSIS — D649 Anemia, unspecified: Secondary | ICD-10-CM

## 2017-11-22 DIAGNOSIS — K219 Gastro-esophageal reflux disease without esophagitis: Secondary | ICD-10-CM | POA: Diagnosis not present

## 2017-11-22 DIAGNOSIS — K2970 Gastritis, unspecified, without bleeding: Secondary | ICD-10-CM | POA: Insufficient documentation

## 2017-11-22 DIAGNOSIS — N189 Chronic kidney disease, unspecified: Secondary | ICD-10-CM | POA: Insufficient documentation

## 2017-11-22 DIAGNOSIS — D508 Other iron deficiency anemias: Secondary | ICD-10-CM

## 2017-11-22 DIAGNOSIS — K2990 Gastroduodenitis, unspecified, without bleeding: Secondary | ICD-10-CM | POA: Diagnosis not present

## 2017-11-22 LAB — COMPREHENSIVE METABOLIC PANEL
ALT: 25 U/L (ref 14–54)
ANION GAP: 9 (ref 5–15)
AST: 25 U/L (ref 15–41)
Albumin: 3.9 g/dL (ref 3.5–5.0)
Alkaline Phosphatase: 77 U/L (ref 38–126)
BUN: 13 mg/dL (ref 6–20)
CHLORIDE: 103 mmol/L (ref 101–111)
CO2: 27 mmol/L (ref 22–32)
Calcium: 9.8 mg/dL (ref 8.9–10.3)
Creatinine, Ser: 1.01 mg/dL — ABNORMAL HIGH (ref 0.44–1.00)
GFR calc non Af Amer: 52 mL/min — ABNORMAL LOW (ref >60)
Glucose, Bld: 145 mg/dL — ABNORMAL HIGH (ref 65–99)
Potassium: 4.9 mmol/L (ref 3.5–5.1)
SODIUM: 139 mmol/L (ref 135–145)
Total Bilirubin: 0.7 mg/dL (ref 0.3–1.2)
Total Protein: 7.2 g/dL (ref 6.5–8.1)

## 2017-11-22 LAB — CBC WITH DIFFERENTIAL/PLATELET
Basophils Absolute: 0 10*3/uL (ref 0.0–0.1)
Basophils Relative: 1 %
EOS ABS: 0.4 10*3/uL (ref 0.0–0.7)
Eosinophils Relative: 7 %
HCT: 37.2 % (ref 36.0–46.0)
Hemoglobin: 11.6 g/dL — ABNORMAL LOW (ref 12.0–15.0)
LYMPHS ABS: 2.4 10*3/uL (ref 0.7–4.0)
Lymphocytes Relative: 43 %
MCH: 29.2 pg (ref 26.0–34.0)
MCHC: 31.2 g/dL (ref 30.0–36.0)
MCV: 93.7 fL (ref 78.0–100.0)
MONOS PCT: 9 %
Monocytes Absolute: 0.5 10*3/uL (ref 0.1–1.0)
Neutro Abs: 2.4 10*3/uL (ref 1.7–7.7)
Neutrophils Relative %: 42 %
PLATELETS: 208 10*3/uL (ref 150–400)
RBC: 3.97 MIL/uL (ref 3.87–5.11)
RDW: 13.3 % (ref 11.5–15.5)
WBC: 5.7 10*3/uL (ref 4.0–10.5)

## 2017-11-22 NOTE — Progress Notes (Signed)
CC'ED TO PCP 

## 2017-11-22 NOTE — Assessment & Plan Note (Addendum)
Gastroduodenitis noted on EGD back in September.  Likely related to aspirin effect.  Patient started on pantoprazole 40 mg daily.  Will continue indefinitely.  She has follow-up with hematology today and PCP next week.  Anticipate labs to be done.  We will follow-up results as available.   OV in 2 years for refills of pantoprazole. If decline in hgb in interim she will let us know. .Marland Kitchen

## 2017-11-22 NOTE — Patient Instructions (Signed)
1. We will follow up lab results as available.

## 2017-11-22 NOTE — Progress Notes (Signed)
Primary Care Physician: [name], MD  Primary Gastroenterologist:  Facility age limit for growth percentiles is 20 years.   Chief Complaint  Patient presents with  . pp f/u    had tcs/egd; doing ok    HPI: Kimberly Freeman is a 77 y.o. female here for follow-up of normocytic anemia, gastritis.  She was seen back in August at the request of her hematologist to consider colonoscopy.  She had a history of colon polyps removed in 2007 by Dr. Elpidio Anis.  Heme-negative in March 2018.  Normocytic anemia with hemoglobin of 10.9 in June 2018.  Anemia panel otherwise unremarkable.  Likely she is always felt well.  No symptomatic anemia.  No GI symptoms.  EGD 06/2017 revealed gastritis/duodenitis.  Felt to be related to aspirin effect per Dr. [name].  Colonoscopy 06/2017 unremarkable. Patient started pantoprazole 40mg  daily at that time.   Follow-up hemoglobin in October was normal.  Iron studies remain normal.  She is seeing hematologist today.  Current Outpatient Medications  Medication Sig Dispense Refill  . acetaminophen (TYLENOL) 500 MG tablet Take 500 mg by mouth daily as needed for mild pain or moderate pain. For pain     . aspirin 325 MG tablet Take 325 mg by mouth every morning.    Marland Kitchen atorvastatin (LIPITOR) 40 MG tablet Take 1 tablet (40 mg total) at bedtime by mouth. 90 tablet 1  . glipiZIDE (GLUCOTROL XL) 5 MG 24 hr tablet Take 1 tablet (5 mg total) daily by mouth. 90 tablet 1  . glucose blood test strip (Onetouch Ultrablue ) Use as instructed for twice daily testing 300 each 1  . Insulin Glargine (BASAGLAR KWIKPEN) 100 UNIT/ML SOPN Inject 0.5 mLs (50 Units total) into the skin at bedtime. 45 mL 1  . 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 irbesartan (AVAPRO) 300 MG tablet Take 1 tablet (300 mg total) by mouth daily. 90 tablet 1  . Lancets (ONETOUCH ULTRASOFT) lancets USE AS DIRECTED TWICE DAILY 200 each 1  . metFORMIN  (GLUCOPHAGE-XR) 500 MG 24 hr tablet Take 2 tablets (1,000 mg total) by mouth 2 (two) times daily. 360 tablet 1  . metoprolol tartrate (LOPRESSOR) 50 MG tablet TAKE 1 TABLET BY MOUTH TWICE DAILY 180 tablet 2  . Multiple Vitamin (MULTIVITAMIN WITH MINERALS) TABS tablet Take 1 tablet by mouth daily.    . nitroGLYCERIN (NITROSTAT) 0.4 MG SL tablet Place 1 tablet (0.4 mg total) under the tongue every 5 (five) minutes as needed for chest pain. 30 tablet o  . pantoprazole (PROTONIX) 40 MG tablet 1 po 30 min before first meal 90 tablet 3  . spironolactone (ALDACTONE) 25 MG tablet Take 1 tablet (25 mg total) daily by mouth. 90 tablet 1   No current facility-administered medications for this visit.     Allergies as of 11/22/2017 - Review Complete 11/22/2017  Allergen Reaction Noted  . Poison oak extract [poison oak extract] Hives 10/25/2013  . Sulfonamide derivatives Itching 04/21/2008    ROS:  General: Negative for anorexia, weight loss, fever, chills, fatigue, weakness. ENT: Negative for hoarseness, difficulty swallowing , nasal congestion. CV: Negative for chest pain, angina, palpitations, dyspnea on exertion, peripheral edema.  Respiratory: Negative for dyspnea at rest, dyspnea on exertion, cough, sputum, wheezing.  GI: See history of present illness. GU:  Negative for dysuria, hematuria, urinary incontinence, urinary frequency, nocturnal urination.  Endo: Negative for  unusual weight change.    Physical Examination:   BP (!) 150/77   Pulse 77   Temp (!) 97 F (36.1 C) (Oral)   Ht 5\' 7"  (1.702 m)   Wt 190 lb 6.4 oz (86.4 kg)   BMI 29.82 kg/m   General: Well-nourished, well-developed in no acute distress.  Eyes: No icterus. Mouth: Oropharyngeal mucosa moist and pink , no lesions erythema or exudate. Lungs: Clear to auscultation bilaterally.  Heart: Regular rate and rhythm, no murmurs rubs or gallops.  Abdomen: Bowel sounds are normal, nontender, nondistended, no hepatosplenomegaly  or masses, no abdominal bruits or hernia , no rebound or guarding.   Extremities: No lower extremity edema. No clubbing or deformities. Neuro: Alert and oriented x 4   Skin: Warm and dry, no jaundice.   Psych: Alert and cooperative, normal mood and affect.  Labs:  Lab Results  Component Value Date   CREATININE 1.04 (H) 07/23/2017   BUN 18 07/23/2017   NA 139 07/23/2017   K 3.8 07/23/2017   CL 105 07/23/2017   CO2 24 07/23/2017   Lab Results  Component Value Date   ALT 24 07/23/2017   AST 24 07/23/2017   ALKPHOS 84 07/23/2017   BILITOT 0.7 07/23/2017   Lab Results  Component Value Date   WBC 5.9 07/23/2017   HGB 12.0 07/23/2017   HCT 37.0 07/23/2017   MCV 90.5 07/23/2017   PLT 255 07/23/2017   Lab Results  Component Value Date   IRON 60 07/23/2017   TIBC 344 07/23/2017   FERRITIN 67 07/23/2017   Lab Results  Component Value Date   VITAMINB12 374 07/23/2017   Lab Results  Component Value Date   FOLATE 37.0 07/23/2017    Imaging Studies: No results found.

## 2017-11-22 NOTE — Progress Notes (Signed)
REVIEWED-NO ADDITIONAL RECOMMENDATIONS. 

## 2017-11-26 DIAGNOSIS — E785 Hyperlipidemia, unspecified: Secondary | ICD-10-CM | POA: Diagnosis not present

## 2017-11-26 DIAGNOSIS — I10 Essential (primary) hypertension: Secondary | ICD-10-CM | POA: Diagnosis not present

## 2017-11-26 DIAGNOSIS — E1065 Type 1 diabetes mellitus with hyperglycemia: Secondary | ICD-10-CM | POA: Diagnosis not present

## 2017-11-27 ENCOUNTER — Ambulatory Visit: Payer: Medicare Other | Admitting: Family Medicine

## 2017-11-27 ENCOUNTER — Other Ambulatory Visit (HOSPITAL_COMMUNITY)
Admission: AD | Admit: 2017-11-27 | Discharge: 2017-11-27 | Disposition: A | Discharge status: Home / Self Care | Payer: Medicare Other | Source: Skilled Nursing Facility | Attending: Family Medicine | Admitting: Family Medicine

## 2017-11-27 ENCOUNTER — Encounter: Payer: Self-pay | Admitting: Family Medicine

## 2017-11-27 VITALS — BP 150/70 | HR 73 | Resp 16 | Ht 67.0 in | Wt 191.0 lb

## 2017-11-27 DIAGNOSIS — D649 Anemia, unspecified: Secondary | ICD-10-CM | POA: Diagnosis not present

## 2017-11-27 DIAGNOSIS — I10 Essential (primary) hypertension: Secondary | ICD-10-CM

## 2017-11-27 DIAGNOSIS — E10649 Type 1 diabetes mellitus with hypoglycemia without coma: Secondary | ICD-10-CM | POA: Diagnosis not present

## 2017-11-27 DIAGNOSIS — E663 Overweight: Secondary | ICD-10-CM | POA: Diagnosis not present

## 2017-11-27 DIAGNOSIS — E785 Hyperlipidemia, unspecified: Secondary | ICD-10-CM | POA: Diagnosis not present

## 2017-11-27 LAB — LIPID PANEL
CHOLESTEROL: 160 mg/dL (ref <200)
HDL: 52 mg/dL (ref >50)
LDL CHOLESTEROL (CALC): 85 mg/dL
Non-HDL Cholesterol (Calc): 108 mg/dL (calc) (ref <130)
TRIGLYCERIDES: 124 mg/dL (ref <150)
Total CHOL/HDL Ratio: 3.1 (calc) (ref <5.0)

## 2017-11-27 LAB — COMPLETE METABOLIC PANEL WITH GFR
AG RATIO: 1.6 (calc) (ref 1.0–2.5)
ALBUMIN MSPROF: 4.3 g/dL (ref 3.6–5.1)
ALKALINE PHOSPHATASE (APISO): 79 U/L (ref 33–130)
ALT: 29 U/L (ref 6–29)
AST: 26 U/L (ref 10–35)
BILIRUBIN TOTAL: 0.5 mg/dL (ref 0.2–1.2)
BUN / CREAT RATIO: 13 (calc) (ref 6–22)
BUN: 15 mg/dL (ref 7–25)
CHLORIDE: 106 mmol/L (ref 98–110)
CO2: 31 mmol/L (ref 20–32)
Calcium: 9.9 mg/dL (ref 8.6–10.4)
Creat: 1.13 mg/dL — ABNORMAL HIGH (ref 0.60–0.93)
GFR, Est African American: 54 mL/min/{1.73_m2} — ABNORMAL LOW (ref >=60)
GFR, Est Non African American: 47 mL/min/{1.73_m2} — ABNORMAL LOW (ref >=60)
GLUCOSE: 89 mg/dL (ref 65–99)
Globulin: 2.7 g/dL (calc) (ref 1.9–3.7)
POTASSIUM: 3.8 mmol/L (ref 3.5–5.3)
Sodium: 142 mmol/L (ref 135–146)
Total Protein: 7 g/dL (ref 6.1–8.1)

## 2017-11-27 LAB — HEMOGLOBIN A1C
HEMOGLOBIN A1C: 7.3 %{Hb} — AB (ref <5.7)
Mean Plasma Glucose: 163 (calc)
eAG (mmol/L): 9 (calc)

## 2017-11-27 MED ORDER — METOPROLOL TARTRATE 50 MG PO TABS: ORAL_TABLET | ORAL | 3 refills | Status: DC

## 2017-11-27 NOTE — Patient Instructions (Addendum)
F/U in 4. months, call if you need me before  Nurse BP check mid March  Blood pressure is high , increase Metoprolol to 1.5 tablets twice daily   Microalb today  Non fast HBA1C , chem 7 and EGFR 5 days before next visit  It is important that you exercise regularly at least 30 minutes 5 times a week. If you develop chest pain, have severe difficulty breathing, or feel very tired, stop exercising immediately and seek medical attention    .    Keep up the great work  Happy 71!!!

## 2017-11-27 NOTE — Assessment & Plan Note (Signed)
Uncontrolled, increase metoprolol to 1 5 twice daily DASH diet and commitment to daily physical activity for a minimum of 30 minutes discussed and encouraged, as a part of hypertension management. The importance of attaining a healthy weight is also discussed.  BP/Weight 11/27/2017 11/22/2017 09/18/2017 07/25/2017 07/23/2017 07/18/2017 06/03/2017  Systolic BP 150 150 148 122 157 128 136  Diastolic BP 70 77 82 70 62 67 77  Wt. (Lbs) 191 190.4 194.12 190.5 190.4 189 189.6  BMI 29.91 29.82 30.4 29.84 29.82 29.6 29.7

## 2017-11-28 ENCOUNTER — Other Ambulatory Visit (HOSPITAL_COMMUNITY): Payer: Self-pay

## 2017-11-28 ENCOUNTER — Inpatient Hospital Stay (HOSPITAL_BASED_OUTPATIENT_CLINIC_OR_DEPARTMENT_OTHER): Payer: Medicare Other | Admitting: Adult Health

## 2017-11-28 ENCOUNTER — Encounter (HOSPITAL_COMMUNITY): Payer: Self-pay | Admitting: Adult Health

## 2017-11-28 VITALS — BP 146/76 | HR 73 | Temp 97.9°F | Resp 18 | Wt 190.0 lb

## 2017-11-28 DIAGNOSIS — R7989 Other specified abnormal findings of blood chemistry: Secondary | ICD-10-CM

## 2017-11-28 DIAGNOSIS — D649 Anemia, unspecified: Secondary | ICD-10-CM

## 2017-11-28 DIAGNOSIS — N189 Chronic kidney disease, unspecified: Secondary | ICD-10-CM | POA: Diagnosis not present

## 2017-11-28 LAB — MICROALBUMIN / CREATININE URINE RATIO
Creatinine, Urine: 173.6 mg/dL
MICROALB UR: 8.8 ug/mL — AB
Microalb Creat Ratio: 5.1 mg/g creat (ref 0.0–30.0)

## 2017-11-28 NOTE — Progress Notes (Signed)
[place], [place] [address]   CLINIC:  Medical Oncology/Hematology  PCP:  Fayrene Helper, MD [address], [address] [place] [address] [phone]   REASON FOR VISIT:  Follow-up for Anemia   CURRENT THERAPY: Observation      HISTORY OF PRESENT ILLNESS:  (From Dr. Laverle Patter note on 07/23/17)      INTERVAL HISTORY:  Kimberly Freeman 77 y.o. female returns for follow-up for anemia.   Here today unaccompanied.   Overall, she tells me that she feels very well. Appetite and energy levels 100%.  She denies any frank bleeding episodes including blood in her stools, dark/tarry stools, hematuria, vaginal bleeding, nosebleeds, or gingival bleeding. Denies any cough/shortness of breath or severe fatigue.    She tells me that she had labs done for our office and her PCP's office last week. She is wondering if there is a way to coordinate future lab appts so that she only has 1 lab visit/lab draw; we will do our best to accommodate this for her.   Otherwise, she is largely without complaints today. For fun, she likes to travel to [place], [place] where she and her husband have a home. They recently spent ~1 month there spending time with her husband's family; they had a wonderful time.     REVIEW OF SYSTEMS:  Review of Systems  Constitutional: Negative.  Negative for chills, fatigue and fever.  HENT:  Negative.  Negative for lump/mass and nosebleeds.   Eyes: Negative.   Respiratory: Negative.  Negative for cough and shortness of breath.   Cardiovascular: Negative.  Negative for chest pain and leg swelling.  Gastrointestinal: Negative.  Negative for abdominal pain, blood in stool, constipation, diarrhea, nausea and vomiting.  Endocrine: Negative.   Genitourinary: Negative.  Negative for dysuria and hematuria.   Musculoskeletal: Negative.  Negative for arthralgias.  Skin: Negative.  Negative for rash.  Neurological: Negative.  Negative for  dizziness and headaches.  Hematological: Negative.  Negative for adenopathy. Does not bruise/bleed easily.  Psychiatric/Behavioral: Negative.  Negative for depression and sleep disturbance. The patient is not nervous/anxious.      PAST MEDICAL/SURGICAL HISTORY:  Past Medical History:  Diagnosis Date  . Diabetes mellitus, type 2 ([place])   . Essential hypertension   . GERD (gastroesophageal reflux disease)   . Hyperlipidemia    Past Surgical History:  Procedure Laterality Date  . ABDOMINAL HYSTERECTOMY    . BIOPSY  07/18/2017   Procedure: BIOPSY;  Surgeon: Danie Binder, MD;  Location: [facility];  Service: Endoscopy;;  gastric  . CHOLECYSTECTOMY  2004  . COLONOSCOPY N/A 07/18/2017   Dr. Oneida Alar: Internal and external hemorrhoids.  No future screening/surveillance colonoscopies due to age.  . ESOPHAGOGASTRODUODENOSCOPY N/A 07/18/2017   Dr. Oneida Alar: Small hiatal hernia, patchy mild inflammation characterized by congestion, erosions, erythema in the cardia, gastric body and antrum.  Diffuse moderate inflammation in the duodenal bulb and second portion duodenum.  Biopsies benign.  No H. pylori.     SOCIAL HISTORY:  Social History   Socioeconomic History  . Marital status: Married    Spouse name: Not on file  . Number of children: Not on file  . Years of education: Not on file  . Highest education level: Not on file  Social Needs  . Financial resource strain: Not hard at all  . Food insecurity - worry: Never true  . Food insecurity - inability: Never true  . Transportation needs -  medical: No  . Transportation needs - non-medical: No  Occupational History  . Not on file  Tobacco Use  . Smoking status: Never Smoker  . Smokeless tobacco: Never Used  Substance and Sexual Activity  . Alcohol use: No    Alcohol/week: 0.0 oz  . Drug use: No  . Sexual activity: Not Currently    Partners: Male  Other Topics Concern  . Not on file  Social History Narrative  . Not on file     FAMILY HISTORY:  Family History  Problem Relation Age of Onset  . Leukemia Mother   . Cancer Mother   . Prostate cancer Father   . Lung cancer Sister 1  . Diabetes Sister   . Colon cancer Neg Hx     CURRENT MEDICATIONS:  Outpatient Encounter Medications as of 11/28/2017  Medication Sig Note  . acetaminophen (TYLENOL) 500 MG tablet Take 500 mg by mouth daily as needed for mild pain or moderate pain. For pain    . aspirin 325 MG tablet Take 325 mg by mouth every morning.   Marland Kitchen atorvastatin (LIPITOR) 40 MG tablet Take 1 tablet (40 mg total) at bedtime by mouth.   Marland Kitchen glipiZIDE (GLUCOTROL XL) 5 MG 24 hr tablet Take 1 tablet (5 mg total) daily by mouth.   Marland Kitchen glucose blood test strip (Onetouch Ultrablue ) Use as instructed for twice daily testing   . Insulin Glargine (BASAGLAR KWIKPEN) 100 UNIT/ML SOPN Inject 0.5 mLs (50 Units total) into the skin at bedtime. 11/27/2017: 45 units   . Insulin Pen Needle 32G X 6 MM MISC Use to inject insulin daily dx e11.65   . irbesartan (AVAPRO) 300 MG tablet Take 1 tablet (300 mg total) by mouth daily.   . Lancets (ONETOUCH ULTRASOFT) lancets USE AS DIRECTED TWICE DAILY   . metFORMIN (GLUCOPHAGE-XR) 500 MG 24 hr tablet Take 2 tablets (1,000 mg total) by mouth 2 (two) times daily.   . metoprolol tartrate (LOPRESSOR) 50 MG tablet One and a half tablets twice daily   . Multiple Vitamin (MULTIVITAMIN WITH MINERALS) TABS tablet Take 1 tablet by mouth daily.   . nitroGLYCERIN (NITROSTAT) 0.4 MG SL tablet Place 1 tablet (0.4 mg total) under the tongue every 5 (five) minutes as needed for chest pain.   . pantoprazole (PROTONIX) 40 MG tablet 1 po 30 min before first meal   . spironolactone (ALDACTONE) 25 MG tablet Take 1 tablet (25 mg total) daily by mouth.    No facility-administered encounter medications on file as of 11/28/2017.     ALLERGIES:  Allergies  Allergen Reactions  . Poison Oak Extract [Poison Oak Extract] Hives  . Sulfonamide Derivatives Itching     Burning sensation all over body     PHYSICAL EXAM:  ECOG Performance status: 0 - Asymptomatic   Vitals:   11/28/17 1007  BP: (!) 146/76  Pulse: 73  Resp: 18  Temp: 97.9 F (36.6 C)  SpO2: 100%   Filed Weights   11/28/17 1007  Weight: 190 lb (86.2 kg)    Physical Exam  Constitutional: She is oriented to person, place, and time and well-developed, well-nourished, and in no distress.  HENT:  Head: Normocephalic.  Mouth/Throat: Oropharynx is clear and moist. No oropharyngeal exudate.  Eyes: Conjunctivae are normal. Pupils are equal, round, and reactive to light. No scleral icterus.  Neck: Normal range of motion. Neck supple.  Cardiovascular: Normal rate and regular rhythm.  Pulmonary/Chest: Effort normal and breath sounds normal. No respiratory  distress.  Abdominal: Soft. Bowel sounds are normal. There is no tenderness.  Musculoskeletal: Normal range of motion. She exhibits no edema.  Lymphadenopathy:    She has no cervical adenopathy.       Right: No supraclavicular adenopathy present.       Left: No supraclavicular adenopathy present.  Neurological: She is alert and oriented to person, place, and time. No cranial nerve deficit. Gait normal.  Skin: Skin is warm and dry. No rash noted.  Psychiatric: Mood, memory, affect and judgment normal.  Nursing note and vitals reviewed.    LABORATORY DATA:  I have reviewed the labs as listed.  CBC    Component Value Date/Time   WBC 5.7 11/22/2017 1051   RBC 3.97 11/22/2017 1051   HGB 11.6 (L) 11/22/2017 1051   HCT 37.2 11/22/2017 1051   PLT 208 11/22/2017 1051   MCV 93.7 11/22/2017 1051   MCH 29.2 11/22/2017 1051   MCHC 31.2 11/22/2017 1051   RDW 13.3 11/22/2017 1051   LYMPHSABS 2.4 11/22/2017 1051   MONOABS 0.5 11/22/2017 1051   EOSABS 0.4 11/22/2017 1051   BASOSABS 0.0 11/22/2017 1051   CMP Latest Ref Rng & Units 11/26/2017 11/22/2017 07/23/2017  Glucose 65 - 99 mg/dL 89 145(H) 180(H)  BUN 7 - 25 mg/dL '15 13 18    '$ Creatinine 0.60 - 0.93 mg/dL 1.13(H) 1.01(H) 1.04(H)  Sodium 135 - 146 mmol/L 142 139 139  Potassium 3.5 - 5.3 mmol/L 3.8 4.9 3.8  Chloride 98 - 110 mmol/L 106 103 105  CO2 20 - 32 mmol/L '31 27 24  '$ Calcium 8.6 - 10.4 mg/dL 9.9 9.8 9.8  Total Protein 6.1 - 8.1 g/dL 7.0 7.2 7.5  Total Bilirubin 0.2 - 1.2 mg/dL 0.5 0.7 0.7  Alkaline Phos 38 - 126 U/L - 77 84  AST 10 - 35 U/L '26 25 24  '$ ALT 6 - 29 U/L '29 25 24   '$ Results for PRIYA, MATSEN (MRN [id number])   Ref. Range 07/23/2017 09:39  Total Protein ELP Latest Ref Range: 6.0 - 8.5 g/dL 6.9  Albumin SerPl Elph-Mcnc Latest Ref Range: 2.9 - 4.4 g/dL 3.5  Albumin/Glob SerPl Latest Ref Range: 0.7 - 1.7  1.1  Alpha2 Glob SerPl Elph-Mcnc Latest Ref Range: 0.4 - 1.0 g/dL 0.8  Alpha 1 Latest Ref Range: 0.0 - 0.4 g/dL 0.2  Gamma Glob SerPl Elph-Mcnc Latest Ref Range: 0.4 - 1.8 g/dL 1.2  M Protein SerPl Elph-Mcnc Latest Ref Range: Not Observed g/dL Not Observed  IFE 1 Unknown Comment  Globulin, Total Latest Ref Range: 2.2 - 3.9 g/dL 3.4  B-Globulin SerPl Elph-Mcnc Latest Ref Range: 0.7 - 1.3 g/dL 1.3  IgG (Immunoglobin G), Serum Latest Ref Range: 700 - 1,600 mg/dL 1,113  IgA Latest Ref Range: 64 - 422 mg/dL 281  IgM (Immunoglobulin M), Srm Latest Ref Range: 26 - 217 mg/dL 23 (L)    Results for SUSSAN, METER (MRN [id number])   Ref. Range 07/23/2017 09:39  Iron Latest Ref Range: 28 - 170 ug/dL 60  UIBC Latest Units: ug/dL 284  TIBC Latest Ref Range: 250 - 450 ug/dL 344  Saturation Ratios Latest Ref Range: 10.4 - 31.8 % 17  Ferritin Latest Ref Range: 11 - 307 ng/mL 67  Folate Latest Ref Range: >5.9 ng/mL 37.0    PENDING LABS:    DIAGNOSTIC IMAGING:    PATHOLOGY:     ASSESSMENT & PLAN:   Normocytic anemia:  -Most recent Hgb on 11/22/17 only mildly low at  11.6 g/dL. MCV/MCH/MCHC/RDW all normal.  Reviewed additional lab results from 07/2017 with patient in detail today as well.  SPEP/IFE unremarkable ruling out multiple myeloma as  possible etiology of anemia.  Iron studies also WNL; ferritin 67. There may be a small element of iron deficiency contributing to her anemia given her history of gastritis/duodenitis d/t chronic aspirin use.  Vitamin B12 and folate levels normal.  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   -Clinically, she feels very well and is largely asymptomatic from her mild anemia. Would recommend continued observation for now. Gave her instructions to call us with any new/concerning symptoms, including bleeding, severe fatigue, shortness of breath, etc, and we would be happy to see her sooner either for blood work or office visit if needed.  -To try to better coordinate her care, I will add-on CBC with diff to her blood work that Dr. Moshe Cipro, pt's PCP, has ordered to be collected in 4 months.  She agreed with this plan.  -Patient to return to cancer center in 6 months for follow-up with labs.        Dispo:  -Add-on CBC with diff to labs that will be collected by PCP in ~4 months; orders placed for CBC with diff.  -Return to cancer center in 6 months for follow-up with labs (CBC with diff, BMP, and iron studies).    All questions were answered to patient's stated satisfaction. Encouraged patient to call with any new concerns or questions before her next visit to the cancer center and we can certain see her sooner, if needed.      Orders placed this encounter:  Orders Placed This Encounter  Procedures  . CBC with Differential/Platelet  . Basic metabolic panel  . Ferritin  . Iron and TIBC  . CBC with Differential/Platelet      Mike Craze, NP [place] [phone]

## 2017-11-28 NOTE — Patient Instructions (Signed)
[facility] at [facility]  Discharge Instructions:  You were seen by Lubertha BasqueGretchen [facility], NP. Return in 6 months for follow up with Vinnie LangtonGretchen. We will add lab work to your labs with Dr. Lodema HongSimpson.  It will be a CBC.   _______________________________________________________________  Thank you for choosing [facility] at [facility] to provide your oncology and hematology care.  To afford each patient quality time with our providers, please arrive at least 15 minutes before your scheduled appointment.  You need to re-schedule your appointment if you arrive 10 or more minutes late.  We strive to give you quality time with our providers, and arriving late affects you and other patients whose appointments are after yours.  Also, if you no show three or more times for appointments you may be dismissed from the clinic.  Again, thank you for choosing [facility] at [facility]. Our hope is that these requests will allow you access to exceptional care and in a timely manner. _______________________________________________________________  If you have questions after your visit, please contact our office at [phone] between the hours of 8:30 a.m. and 5:00 p.m. Voicemails left after 4:30 p.m. will not be returned until the following business day. _______________________________________________________________  For prescription refill requests, have your pharmacy contact our office. _______________________________________________________________  Recommendations made by the consultant and any test results will be sent to your referring physician. _______________________________________________________________

## 2017-11-29 ENCOUNTER — Ambulatory Visit (HOSPITAL_COMMUNITY): Payer: Medicare Other | Admitting: Adult Health

## 2017-12-01 ENCOUNTER — Encounter: Payer: Self-pay | Admitting: Family Medicine

## 2017-12-01 NOTE — Assessment & Plan Note (Signed)
unchanged. Patient re-educated about  the importance of commitment to a  minimum of 150 minutes of exercise per week.  The importance of healthy food choices with portion control discussed. Encouraged to start a food diary, count calories and to consider  joining a support group. Sample diet sheets offered. Goals set by the patient for the next several months.   Weight /BMI 11/28/2017 11/27/2017 11/22/2017  WEIGHT 190 lb 191 lb 190 lb 6.4 oz  HEIGHT - 5\' 7"  5\' 7"   BMI 29.76 kg/m2 29.91 kg/m2 29.82 kg/m2

## 2017-12-01 NOTE — Assessment & Plan Note (Signed)
Controlled, no change in medication Kimberly Freeman is reminded of the importance of commitment to daily physical activity for 30 minutes or more, as able and the need to limit carbohydrate intake to 30 to 60 grams per meal to help with blood sugar control.   The need to take medication as prescribed, test blood sugar as directed, and to call between visits if there is a concern that blood sugar is uncontrolled is also discussed.   Kimberly Freeman is reminded of the importance of daily foot exam, annual eye examination, and good blood sugar, blood pressure and cholesterol control.  Diabetic Labs Latest Ref Rng & Units 11/27/2017 11/26/2017 11/22/2017 07/23/2017 07/23/2017  HbA1c <5.7 % of total Hgb - 7.3(H) - 7.0(H) -  Microalbumin Not Estab. ug/mL 8.8(H) - - - -  Micro/Creat Ratio 0.0 - 30.0 mg/g creat 5.1 - - - -  Chol <200 mg/dL - 161160 - - -  HDL >09>50 mg/dL - 52 - - -  Calc LDL <604<100 mg/dL - - - - -  Triglycerides <150 mg/dL - 540124 - - -  Creatinine 0.60 - 0.93 mg/dL - 9.81(X1.13(H) 9.14(N1.01(H) 8.29(F1.04(H) 1.06(H)   BP/Weight 11/28/2017 11/27/2017 11/22/2017 09/18/2017 07/25/2017 07/23/2017 07/18/2017  Systolic BP 146 150 150 148 122 157 128  Diastolic BP 76 70 77 82 70 62 67  Wt. (Lbs) 190 191 190.4 194.12 190.5 190.4 189  BMI 29.76 29.91 29.82 30.4 29.84 29.82 29.6   Foot/eye exam completion dates Latest Ref Rng & Units 07/25/2017 04/11/2016  Eye Exam No Retinopathy - -  Foot exam Order - - -  Foot Form Completion - Done Done

## 2017-12-01 NOTE — Progress Notes (Signed)
Kimberly Freeman     MRN: [id number]      DOB: [birth year]   HPI Kimberly Freeman is here for follow up and re-evaluation of chronic medical conditions, medication management and review of any available recent lab and radiology data.  Preventive health is updated, specifically  Cancer screening and Immunization.   Questions or concerns regarding consultations or procedures which the PT has had in the interim are  addressed. The PT denies any adverse reactions to current medications since the last visit.  There are no new concerns.  There are no specific complaints  Denies polyuria, polydipsia, blurred vision , or hypoglycemic episodes.   ROS Denies recent fever or chills. Denies sinus pressure, nasal congestion, ear pain or sore throat. Denies chest congestion, productive cough or wheezing. Denies chest pains, palpitations and leg swelling Denies abdominal pain, nausea, vomiting,diarrhea or constipation.   Denies dysuria, frequency, hesitancy or incontinence. Denies joint pain, swelling and limitation in mobility. Denies headaches, seizures, numbness, or tingling. Denies depression, anxiety or insomnia. Denies skin break down or rash.   PE  BP (!) 150/70   Pulse 73   Resp 16   Ht 5\' 7"  (1.702 m)   Wt 191 lb (86.6 kg)   SpO2 97%   BMI 29.91 kg/m   Patient alert and oriented and in no cardiopulmonary distress.  HEENT: No facial asymmetry, EOMI,   oropharynx pink and moist.  Neck supple no JVD, no mass.  Chest: Clear to auscultation bilaterally.  CVS: S1, S2 no murmurs, no S3.Regular rate.  ABD: Soft non tender.   Ext: No edema  MS: Adequate ROM spine, shoulders, hips and knees.  Skin: Intact, no ulcerations or rash noted.  Psych: Good eye contact, normal affect. Memory intact not anxious or depressed appearing.  CNS: CN 2-12 intact, power,  normal throughout.no focal deficits noted.   Assessment & Plan  Essential hypertension Uncontrolled, increase metoprolol to 1  5 twice daily DASH diet and commitment to daily physical activity for a minimum of 30 minutes discussed and encouraged, as a part of hypertension management. The importance of attaining a healthy weight is also discussed.  BP/Weight 11/27/2017 11/22/2017 09/18/2017 07/25/2017 07/23/2017 07/18/2017 06/03/2017  Systolic BP 150 150 148 122 157 128 136  Diastolic BP 70 77 82 70 62 67 77  Wt. (Lbs) 191 190.4 194.12 190.5 190.4 189 189.6  BMI 29.91 29.82 30.4 29.84 29.82 29.6 29.7       Diabetes mellitus, insulin dependent (IDDM), uncontrolled (HCC) Controlled, no change in medication Kimberly Freeman is reminded of the importance of commitment to daily physical activity for 30 minutes or more, as able and the need to limit carbohydrate intake to 30 to 60 grams per meal to help with blood sugar control.   The need to take medication as prescribed, test blood sugar as directed, and to call between visits if there is a concern that blood sugar is uncontrolled is also discussed.   Kimberly Freeman is reminded of the importance of daily foot exam, annual eye examination, and good blood sugar, blood pressure and cholesterol control.  Diabetic Labs Latest Ref Rng & Units 11/27/2017 11/26/2017 11/22/2017 07/23/2017 07/23/2017  HbA1c <5.7 % of total Hgb - 7.3(H) - 7.0(H) -  Microalbumin Not Estab. ug/mL 8.8(H) - - - -  Micro/Creat Ratio 0.0 - 30.0 mg/g creat 5.1 - - - -  Chol <200 mg/dL - 409160 - - -  HDL >81>50 mg/dL - 52 - - -  Calc  LDL <100 mg/dL - - - - -  Triglycerides <150 mg/dL - 409 - - -  Creatinine 0.60 - 0.93 mg/dL - 8.11(B) 1.47(W) 2.95(A) 1.06(H)   BP/Weight 11/28/2017 11/27/2017 11/22/2017 09/18/2017 07/25/2017 07/23/2017 07/18/2017  Systolic BP 146 150 150 148 122 157 128  Diastolic BP 76 70 77 82 70 62 67  Wt. (Lbs) 190 191 190.4 194.12 190.5 190.4 189  BMI 29.76 29.91 29.82 30.4 29.84 29.82 29.6   Foot/eye exam completion dates Latest Ref Rng & Units 07/25/2017 04/11/2016  Eye Exam No Retinopathy - -  Foot exam  Order - - -  Foot Form Completion - Done Done        Overweight (BMI 25.0-29.9) unchanged. Patient re-educated about  the importance of commitment to a  minimum of 150 minutes of exercise per week.  The importance of healthy food choices with portion control discussed. Encouraged to start a food diary, count calories and to consider  joining a support group. Sample diet sheets offered. Goals set by the patient for the next several months.   Weight /BMI 11/28/2017 11/27/2017 11/22/2017  WEIGHT 190 lb 191 lb 190 lb 6.4 oz  HEIGHT - 5\' 7"  5\' 7"   BMI 29.76 kg/m2 29.91 kg/m2 29.82 kg/m2      Hyperlipidemia LDL goal <100 Hyperlipidemia:Low fat diet discussed and encouraged.   Lipid Panel  Lab Results  Component Value Date   CHOL 160 11/26/2017   HDL 52 11/26/2017   LDLCALC 78 03/22/2017   TRIG 124 11/26/2017   CHOLHDL 3.1 11/26/2017  Controlled, no change in medication

## 2017-12-01 NOTE — Assessment & Plan Note (Signed)
Hyperlipidemia:Low fat diet discussed and encouraged.   Lipid Panel  Lab Results  Component Value Date   CHOL 160 11/26/2017   HDL 52 11/26/2017   LDLCALC 78 03/22/2017   TRIG 124 11/26/2017   CHOLHDL 3.1 11/26/2017  Controlled, no change in medication

## 2018-01-07 ENCOUNTER — Ambulatory Visit: Payer: Medicare Other

## 2018-01-07 VITALS — BP 140/70

## 2018-01-07 DIAGNOSIS — I10 Essential (primary) hypertension: Secondary | ICD-10-CM

## 2018-01-07 NOTE — Progress Notes (Signed)
Patient advised to continue same meds and keep her next follow up appt and call with any concerns

## 2018-01-21 DIAGNOSIS — B351 Tinea unguium: Secondary | ICD-10-CM | POA: Diagnosis not present

## 2018-01-21 DIAGNOSIS — E1142 Type 2 diabetes mellitus with diabetic polyneuropathy: Secondary | ICD-10-CM | POA: Diagnosis not present

## 2018-01-27 ENCOUNTER — Other Ambulatory Visit: Payer: Self-pay | Admitting: Family Medicine

## 2018-02-05 ENCOUNTER — Encounter: Payer: Self-pay | Admitting: Family Medicine

## 2018-02-05 ENCOUNTER — Ambulatory Visit (HOSPITAL_COMMUNITY)
Admission: RE | Admit: 2018-02-05 | Discharge: 2018-02-05 | Disposition: A | Discharge status: Home / Self Care | Payer: Medicare Other | Source: Ambulatory Visit | Attending: Family Medicine | Admitting: Family Medicine

## 2018-02-05 ENCOUNTER — Ambulatory Visit: Payer: Medicare Other | Admitting: Family Medicine

## 2018-02-05 VITALS — BP 150/80 | HR 74 | Resp 16 | Ht 67.0 in | Wt 188.0 lb

## 2018-02-05 DIAGNOSIS — M79662 Pain in left lower leg: Secondary | ICD-10-CM

## 2018-02-05 DIAGNOSIS — I10 Essential (primary) hypertension: Secondary | ICD-10-CM

## 2018-02-05 MED ORDER — SPIRONOLACTONE 25 MG PO TABS
ORAL_TABLET | ORAL | 3 refills | Status: DC
Start: 2018-02-05 — End: 2019-02-11

## 2018-02-05 MED ORDER — TIZANIDINE HCL 2 MG PO TABS: ORAL_TABLET | ORAL | 0 refills | Status: DC

## 2018-02-05 NOTE — Patient Instructions (Signed)
F/u as before, call if you need me sooner  You need to go to have an US of left calf when you leave here today to ensure no clot in your leg, nurse will verify  You may use tylenol and topical rubs for calf pain and limited supply of once daily muscle relaxant has also been prescribed locally  BP is high, please increase spironolactone to one and a half tablets once daily  Safe travels and congrats to the proud grandparents

## 2018-02-05 NOTE — Assessment & Plan Note (Addendum)
6 day h/o left calf pain and swelling need to evaluate for acute DVT, ultrasound stat performed is negative Pt treated with annale at bedtime and advised to use topical rubs and do calf stretching exercises

## 2018-02-05 NOTE — Progress Notes (Signed)
Kimberly Freeman     MRN: [id number]      DOB: 09-Mar-1941   HPI Kimberly Freeman is here with a 6 day h/o intermittent cramping pain in left calf, no trauma, no SOB, no hemoptysis, no h/o DVT, sometimes feels like a charlie horse Denies polyuria, polydipsia, blurred vision , or hypoglycemic episodes.  ROS Denies recent fever or chills. Denies sinus pressure, nasal congestion, ear pain or sore throat. Denies chest congestion, productive cough or wheezing. Denies chest pains, palpitations and leg swelling Denies abdominal pain, nausea, vomiting,diarrhea or constipation.   Denies dysuria, frequency, hesitancy or incontinence. . Denies headaches, seizures, numbness, or tingling. Denies depression, anxiety or insomnia. Denies skin break down or rash.   PE  BP (!) 150/80   Pulse 74   Resp 16   Ht 5\' 7"  (1.702 m)   Wt 188 lb (85.3 kg)   SpO2 95%   BMI 29.44 kg/m   Patient alert and oriented and in no cardiopulmonary distress.  HEENT: No facial asymmetry, EOMI,   oropharynx pink and moist.  Neck supple no JVD, no mass.  Chest: Clear to auscultation bilaterally.  CVS: S1, S2 no murmurs, no S3.Regular rate.  ABD: Soft non tender.   Ext: Left calf swollen and tender , no erythema or warmth, homann'snegative MS: Adequate ROM spine, shoulders, hips and knees.  Skin: Intact, no ulcerations or rash noted.  Psych: Good eye contact, normal affect. Memory intact not anxious or depressed appearing.  CNS: CN 2-12 intact, power,  normal throughout.no focal deficits noted.   Assessment & Plan Pain of left calf 6 day h/o left calf pain and swelling need to evaluate for acute DVT, ultrasound stat performed is negative Pt treated with annale at bedtime and advised to use topical rubs and do calf stretching exercises  Malignant hypertension Uncontrolled, increase dose of spironolactone DASH diet and commitment to daily physical activity for a minimum of 30 minutes discussed and encouraged,  as a part of hypertension management. The importance of attaining a healthy weight is also discussed.  BP/Weight 02/05/2018 01/07/2018 11/28/2017 11/27/2017 11/22/2017 09/18/2017 07/25/2017  Systolic BP 150 140 146 150 150 148 122  Diastolic BP 80 70 76 70 77 82 70  Wt. (Lbs) 188 - 190 191 190.4 194.12 190.5  BMI 29.44 - 29.76 29.91 29.82 30.4 29.84       Diabetes mellitus, insulin dependent (IDDM), controlled (HCC) Kimberly Freeman is reminded of the importance of commitment to daily physical activity for 30 minutes or more, as able and the need to limit carbohydrate intake to 30 to 60 grams per meal to help with blood sugar control.   The need to take medication as prescribed, test blood sugar as directed, and to call between visits if there is a concern that blood sugar is uncontrolled is also discussed.   Kimberly Freeman is reminded of the importance of daily foot exam, annual eye examination, and good blood sugar, blood pressure and cholesterol control. Controlled, no change in medication Updated lab needed at/ before next visit.   Diabetic Labs Latest Ref Rng & Units 11/27/2017 11/26/2017 11/22/2017 07/23/2017 07/23/2017  HbA1c <5.7 % of total Hgb - 7.3(H) - 7.0(H) -  Microalbumin Not Estab. ug/mL 8.8(H) - - - -  Micro/Creat Ratio 0.0 - 30.0 mg/g creat 5.1 - - - -  Chol <200 mg/dL - 782 - - -  HDL >95 mg/dL - 52 - - -  Calc LDL mg/dL (calc) - 85 - - -  Triglycerides <150 mg/dL - 161124 - - -  Creatinine 0.60 - 0.93 mg/dL - 0.96(E1.13(H) 4.54(U1.01(H) 9.81(X1.04(H) 1.06(H)   BP/Weight 02/05/2018 01/07/2018 11/28/2017 11/27/2017 11/22/2017 09/18/2017 07/25/2017  Systolic BP 150 140 146 150 150 148 122  Diastolic BP 80 70 76 70 77 82 70  Wt. (Lbs) 188 - 190 191 190.4 194.12 190.5  BMI 29.44 - 29.76 29.91 29.82 30.4 29.84   Foot/eye exam completion dates Latest Ref Rng & Units 07/25/2017 04/11/2016  Eye Exam No Retinopathy - -  Foot exam Order - - -  Foot Form Completion - Done Done

## 2018-02-09 ENCOUNTER — Encounter: Payer: Self-pay | Admitting: Family Medicine

## 2018-02-09 NOTE — Assessment & Plan Note (Signed)
Kimberly Freeman is reminded of the importance of commitment to daily physical activity for 30 minutes or more, as able and the need to limit carbohydrate intake to 30 to 60 grams per meal to help with blood sugar control.   The need to take medication as prescribed, test blood sugar as directed, and to call between visits if there is a concern that blood sugar is uncontrolled is also discussed.   Kimberly Freeman is reminded of the importance of daily foot exam, annual eye examination, and good blood sugar, blood pressure and cholesterol control. Controlled, no change in medication Updated lab needed at/ before next visit.   Diabetic Labs Latest Ref Rng & Units 11/27/2017 11/26/2017 11/22/2017 07/23/2017 07/23/2017  HbA1c <5.7 % of total Hgb - 7.3(H) - 7.0(H) -  Microalbumin Not Estab. ug/mL 8.8(H) - - - -  Micro/Creat Ratio 0.0 - 30.0 mg/g creat 5.1 - - - -  Chol <200 mg/dL - 914160 - - -  HDL >78>50 mg/dL - 52 - - -  Calc LDL mg/dL (calc) - 85 - - -  Triglycerides <150 mg/dL - 295124 - - -  Creatinine 0.60 - 0.93 mg/dL - 6.21(H1.13(H) 0.86(V1.01(H) 7.84(O1.04(H) 1.06(H)   BP/Weight 02/05/2018 01/07/2018 11/28/2017 11/27/2017 11/22/2017 09/18/2017 07/25/2017  Systolic BP 150 140 146 150 150 148 122  Diastolic BP 80 70 76 70 77 82 70  Wt. (Lbs) 188 - 190 191 190.4 194.12 190.5  BMI 29.44 - 29.76 29.91 29.82 30.4 29.84   Foot/eye exam completion dates Latest Ref Rng & Units 07/25/2017 04/11/2016  Eye Exam No Retinopathy - -  Foot exam Order - - -  Foot Form Completion - Done Done

## 2018-02-09 NOTE — Assessment & Plan Note (Signed)
Uncontrolled, increase dose of spironolactone DASH diet and commitment to daily physical activity for a minimum of 30 minutes discussed and encouraged, as a part of hypertension management. The importance of attaining a healthy weight is also discussed.  BP/Weight 02/05/2018 01/07/2018 11/28/2017 11/27/2017 11/22/2017 09/18/2017 07/25/2017  Systolic BP 150 140 146 150 150 148 122  Diastolic BP 80 70 76 70 77 82 70  Wt. (Lbs) 188 - 190 191 190.4 194.12 190.5  BMI 29.44 - 29.76 29.91 29.82 30.4 29.84

## 2018-04-01 DIAGNOSIS — B351 Tinea unguium: Secondary | ICD-10-CM | POA: Diagnosis not present

## 2018-04-01 DIAGNOSIS — E1142 Type 2 diabetes mellitus with diabetic polyneuropathy: Secondary | ICD-10-CM | POA: Diagnosis not present

## 2018-04-02 DIAGNOSIS — E10649 Type 1 diabetes mellitus with hypoglycemia without coma: Secondary | ICD-10-CM | POA: Diagnosis not present

## 2018-04-03 ENCOUNTER — Ambulatory Visit: Payer: Medicare Other | Admitting: Family Medicine

## 2018-04-03 VITALS — BP 170/84 | HR 72 | Resp 16 | Ht 67.0 in | Wt 189.0 lb

## 2018-04-03 DIAGNOSIS — I10 Essential (primary) hypertension: Secondary | ICD-10-CM

## 2018-04-03 DIAGNOSIS — IMO0001 Reserved for inherently not codable concepts without codable children: Secondary | ICD-10-CM

## 2018-04-03 DIAGNOSIS — E785 Hyperlipidemia, unspecified: Secondary | ICD-10-CM | POA: Diagnosis not present

## 2018-04-03 DIAGNOSIS — E119 Type 2 diabetes mellitus without complications: Secondary | ICD-10-CM

## 2018-04-03 DIAGNOSIS — M1712 Unilateral primary osteoarthritis, left knee: Secondary | ICD-10-CM | POA: Diagnosis not present

## 2018-04-03 DIAGNOSIS — Z794 Long term (current) use of insulin: Secondary | ICD-10-CM | POA: Diagnosis not present

## 2018-04-03 LAB — MICROALBUMIN / CREATININE URINE RATIO
CREATININE, URINE: 108 mg/dL (ref 20–275)
MICROALB/CREAT RATIO: 6 ug/mg{creat} (ref <30)
Microalb, Ur: 0.7 mg/dL

## 2018-04-03 LAB — HEMOGLOBIN A1C
HEMOGLOBIN A1C: 7.5 %{Hb} — AB (ref <5.7)
Mean Plasma Glucose: 169 (calc)
eAG (mmol/L): 9.3 (calc)

## 2018-04-03 LAB — BASIC METABOLIC PANEL WITH GFR
BUN: 15 mg/dL (ref 7–25)
CO2: 30 mmol/L (ref 20–32)
CREATININE: 0.9 mg/dL (ref 0.60–0.93)
Calcium: 10 mg/dL (ref 8.6–10.4)
Chloride: 104 mmol/L (ref 98–110)
GFR, Est African American: 71 mL/min/{1.73_m2} (ref >=60)
GFR, Est Non African American: 61 mL/min/{1.73_m2} (ref >=60)
Glucose, Bld: 125 mg/dL — ABNORMAL HIGH (ref 65–99)
Potassium: 4.5 mmol/L (ref 3.5–5.3)
SODIUM: 141 mmol/L (ref 135–146)

## 2018-04-03 NOTE — Assessment & Plan Note (Addendum)
Increasing pain and instability of left knee rapidly progressing in past 6 weeks, urgent ortho eval , pt to use cane which she already has for safety

## 2018-04-03 NOTE — Patient Instructions (Addendum)
F/u in 6 weeks, call if you need me before  You are referred  Urgently to orthopedics re left knee. You need a cane and will get this today from    , pls use your cane for safety  Blood pressure is elevated mainly because you are anxious , however I do believe increasing spironolactone to 2 daily is needed, please return next week monday  At 1 pm for me  To check your BP, taking two spironolactone 2 mg tablets daily  Congrats on excellent labs  I do believe the knee will get better as fast as it got do bad, the bike will get back to you

## 2018-04-04 ENCOUNTER — Encounter: Payer: Self-pay | Admitting: Family Medicine

## 2018-04-04 NOTE — Assessment & Plan Note (Signed)
Uncontrolled, some of the BP elevation is due to her anxiety , however, will increase spironolactone to 50 mg daily, and review in 2 weeks (courtesy) DASH diet and commitment to daily physical activity for a minimum of 30 minutes discussed and encouraged, as a part of hypertension management. The importance of attaining a healthy weight is also discussed.  BP/Weight 04/03/2018 02/05/2018 01/07/2018 11/28/2017 11/27/2017 11/22/2017 09/18/2017  Systolic BP 170 150 140 146 150 150 148  Diastolic BP 84 80 70 76 70 77 82  Wt. (Lbs) 189 188 - 190 191 190.4 194.12  BMI 29.6 29.44 - 29.76 29.91 29.82 30.4

## 2018-04-04 NOTE — Assessment & Plan Note (Signed)
Hyperlipidemia:Low fat diet discussed and encouraged.   Lipid Panel  Lab Results  Component Value Date   CHOL 160 11/26/2017   HDL 52 11/26/2017   LDLCALC 85 11/26/2017   TRIG 124 11/26/2017   CHOLHDL 3.1 11/26/2017   Controlled, no change in medication

## 2018-04-04 NOTE — Assessment & Plan Note (Signed)
Controlled, no change in medication Kimberly Freeman is reminded of the importance of commitment to daily physical activity for 30 minutes or more, as able and the need to limit carbohydrate intake to 30 to 60 grams per meal to help with blood sugar control.   The need to take medication as prescribed, test blood sugar as directed, and to call between visits if there is a concern that blood sugar is uncontrolled is also discussed.   Kimberly Freeman is reminded of the importance of daily foot exam, annual eye examination, and good blood sugar, blood pressure and cholesterol control.  Diabetic Labs Latest Ref Rng & Units 04/02/2018 11/27/2017 11/26/2017 11/22/2017 07/23/2017  HbA1c <5.7 % of total Hgb 7.5(H) - 7.3(H) - 7.0(H)  Microalbumin mg/dL 0.7 6.2(Z8.8(H) - - -  Micro/Creat Ratio <30 mcg/mg creat 6 5.1 - - -  Chol <200 mg/dL - - 308160 - -  HDL >65>50 mg/dL - - 52 - -  Calc LDL mg/dL (calc) - - 85 - -  Triglycerides <150 mg/dL - - 784124 - -  Creatinine 0.60 - 0.93 mg/dL 6.960.90 - 2.95(M1.13(H) 8.41(L1.01(H) 1.04(H)   BP/Weight 04/03/2018 02/05/2018 01/07/2018 11/28/2017 11/27/2017 11/22/2017 09/18/2017  Systolic BP 170 150 140 146 150 150 148  Diastolic BP 84 80 70 76 70 77 82  Wt. (Lbs) 189 188 - 190 191 190.4 194.12  BMI 29.6 29.44 - 29.76 29.91 29.82 30.4   Foot/eye exam completion dates Latest Ref Rng & Units 07/25/2017 04/11/2016  Eye Exam No Retinopathy - -  Foot exam Order - - -  Foot Form Completion - Done Done

## 2018-04-04 NOTE — Progress Notes (Signed)
Kimberly Freeman     MRN: [id number]      DOB: August 18, 1941   HPI [name] is here for follow up and re-evaluation of chronic medical conditions, medication management and review of any available recent lab and radiology data.  Preventive health is updated, specifically  Cancer screening and Immunization.    The PT denies any adverse reactions to current medications since the last visit.  Ongoing severe pain in left knee ,preventing patient from even riding her stationary bike which she has faithfully done for years. She is limited when she fist starts walking and at times feels unsteady on her feet as though she will fall. She is afraid, tearful , states she will never have knee replacement  Her pain is less than her current debility  Denies polyuria, polydipsia, blurred vision , or hypoglycemic episodes.  ROS Denies recent fever or chills. Denies sinus pressure, nasal congestion, ear pain or sore throat. Denies chest congestion, productive cough or wheezing. Denies chest pains, palpitations and leg swelling Denies abdominal pain, nausea, vomiting,diarrhea or constipation.   Denies dysuria, frequency, hesitancy or incontinence. . Denies headaches, seizures, numbness, or tingling. Denies depression,  or insomnia. Denies skin break down or rash.   PE  BP (!) 170/84   Pulse 72   Resp 16   Ht 5\' 7"  (1.702 m)   Wt 189 lb (85.7 kg)   SpO2 100%   BMI 29.60 kg/m   Patient alert and oriented and in no cardiopulmonary distress.  HEENT: No facial asymmetry, EOMI,   oropharynx pink and moist.  Neck supple no JVD, no mass.  Chest: Clear to auscultation bilaterally.  CVS: S1, S2 no murmurs, no S3.Regular rate.  ABD: Soft non tender.   Ext: No edema   MS reduced ROM  in left  Knee which is deformed.  Skin: Intact, no ulcerations or rash noted.  Psych: Good eye contact, normal affect. Memory intact both very  anxious and tearful.  CNS: CN 2-12 intact, power,  normal  throughout.no focal deficits noted.   Assessment & Plan  Arthritis of left knee Increasing pain and instability of left knee rapidly progressing in past 6 weeks, urgent ortho eval , pt to use cane which she already has for safety  Malignant hypertension Uncontrolled, some of the BP elevation is due to her anxiety , however, will increase spironolactone to 50 mg daily, and review in 2 weeks (courtesy) DASH diet and commitment to daily physical activity for a minimum of 30 minutes discussed and encouraged, as a part of hypertension management. The importance of attaining a healthy weight is also discussed.  BP/Weight 04/03/2018 02/05/2018 01/07/2018 11/28/2017 11/27/2017 11/22/2017 09/18/2017  Systolic BP 170 150 140 146 150 150 148  Diastolic BP 84 80 70 76 70 77 82  Wt. (Lbs) 189 188 - 190 191 190.4 194.12  BMI 29.6 29.44 - 29.76 29.91 29.82 30.4       Diabetes mellitus, insulin dependent (IDDM), controlled (HCC) Controlled, no change in medication [name] is reminded of the importance of commitment to daily physical activity for 30 minutes or more, as able and the need to limit carbohydrate intake to 30 to 60 grams per meal to help with blood sugar control.   The need to take medication as prescribed, test blood sugar as directed, and to call between visits if there is a concern that blood sugar is uncontrolled is also discussed.   [name] is reminded of the importance of daily  foot exam, annual eye examination, and good blood sugar, blood pressure and cholesterol control.  Diabetic Labs Latest Ref Rng & Units 04/02/2018 11/27/2017 11/26/2017 11/22/2017 07/23/2017  HbA1c <5.7 % of total Hgb 7.5(H) - 7.3(H) - 7.0(H)  Microalbumin mg/dL 0.7 1.0(U) - - -  Micro/Creat Ratio <30 mcg/mg creat 6 5.1 - - -  Chol <200 mg/dL - - 725 - -  HDL >36 mg/dL - - 52 - -  Calc LDL mg/dL (calc) - - 85 - -  Triglycerides <150 mg/dL - - 644 - -  Creatinine 0.60 - 0.93 mg/dL 0.34 - 7.42(V) 9.56(L) 1.04(H)    BP/Weight 04/03/2018 02/05/2018 01/07/2018 11/28/2017 11/27/2017 11/22/2017 09/18/2017  Systolic BP 170 150 140 146 150 150 148  Diastolic BP 84 80 70 76 70 77 82  Wt. (Lbs) 189 188 - 190 191 190.4 194.12  BMI 29.6 29.44 - 29.76 29.91 29.82 30.4   Foot/eye exam completion dates Latest Ref Rng & Units 07/25/2017 04/11/2016  Eye Exam No Retinopathy - -  Foot exam Order - - -  Foot Form Completion - Done Done        Hyperlipidemia LDL goal <100 Hyperlipidemia:Low fat diet discussed and encouraged.   Lipid Panel  Lab Results  Component Value Date   CHOL 160 11/26/2017   HDL 52 11/26/2017   LDLCALC 85 11/26/2017   TRIG 124 11/26/2017   CHOLHDL 3.1 11/26/2017   Controlled, no change in medication

## 2018-04-07 ENCOUNTER — Telehealth: Payer: Self-pay | Admitting: Family Medicine

## 2018-04-07 NOTE — Telephone Encounter (Signed)
Pt came in for complimentary MD blood pressure check, today her BP is 150 /70, no medication changes are made

## 2018-04-09 ENCOUNTER — Ambulatory Visit (INDEPENDENT_AMBULATORY_CARE_PROVIDER_SITE_OTHER): Payer: Medicare Other | Admitting: Orthopaedic Surgery

## 2018-04-09 ENCOUNTER — Ambulatory Visit (INDEPENDENT_AMBULATORY_CARE_PROVIDER_SITE_OTHER): Payer: Medicare Other

## 2018-04-09 ENCOUNTER — Encounter: Payer: Self-pay | Admitting: Orthopaedic Surgery

## 2018-04-09 VITALS — BP 158/75 | HR 76 | Ht 65.5 in | Wt 188.0 lb

## 2018-04-09 DIAGNOSIS — M25562 Pain in left knee: Secondary | ICD-10-CM | POA: Diagnosis not present

## 2018-04-09 DIAGNOSIS — G8929 Other chronic pain: Secondary | ICD-10-CM | POA: Diagnosis not present

## 2018-04-09 NOTE — Patient Instructions (Signed)

## 2018-04-09 NOTE — Progress Notes (Signed)
Subjective:    Patient ID: Kimberly Freeman, female    DOB: [birth year], 77 y.o.   MRN: [id number]  HPI She has had several month history of increasing pain of the left knee.  She has swelling, popping and the sensation it might give way.  She has started using a cane.  She has seen Dr. Lodema Hong for this.  She had a negative Doppler study done 02-05-18.  She has been putting up with the pain.  She says she has to stand still a few seconds when getting up out of a chair so her knee can "get in place".  She has no trauma, no redness, no other joint pains.    Review of Systems  Respiratory: Negative for cough and shortness of breath.   Cardiovascular: Negative for chest pain and leg swelling.  Musculoskeletal: Positive for arthralgias, gait problem and joint swelling.  All other systems reviewed and are negative.  Past Medical History:  Diagnosis Date  . Arthritis   . Diabetes mellitus, type 2 (HCC)   . Essential hypertension   . GERD (gastroesophageal reflux disease)   . Hyperlipidemia     Past Surgical History:  Procedure Laterality Date  . ABDOMINAL HYSTERECTOMY    . BIOPSY  07/18/2017   Procedure: BIOPSY;  Surgeon: [name], MD;  Location: [facility];  Service: Endoscopy;;  gastric  . CHOLECYSTECTOMY  2004  . COLONOSCOPY N/A 07/18/2017   Dr. [name]: Internal and external hemorrhoids.  No future screening/surveillance colonoscopies due to age.  . ESOPHAGOGASTRODUODENOSCOPY N/A 07/18/2017   Dr. [name]: Small hiatal hernia, patchy mild inflammation characterized by congestion, erosions, erythema in the cardia, gastric body and antrum.  Diffuse moderate inflammation in the duodenal bulb and second portion duodenum.  Biopsies benign.  No H. pylori.    Current Outpatient Medications on File Prior to Visit  Medication Sig Dispense Refill  . acetaminophen (TYLENOL) 500 MG tablet Take 500 mg by mouth daily as needed for mild pain or moderate pain. For pain     . aspirin 325 MG  tablet Take 325 mg by mouth every morning.    Marland Kitchen atorvastatin (LIPITOR) 40 MG tablet TAKE 1 TABLET BY MOUTH AT BEDTIME 90 tablet 3  . glipiZIDE (GLUCOTROL XL) 5 MG 24 hr tablet TAKE 1 TABLET BY MOUTH DAILY 90 tablet 3  . glucose blood test strip (Onetouch Ultrablue ) Use as instructed for twice daily testing 300 each 1  . Insulin Glargine (BASAGLAR KWIKPEN) 100 UNIT/ML SOPN Inject 0.5 mLs (50 Units total) into the skin at bedtime. 45 mL 1  . 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 irbesartan (AVAPRO) 300 MG tablet TAKE 1 TABLET BY MOUTH DAILY 90 tablet 3  . Lancets (ONETOUCH ULTRASOFT) lancets USE AS DIRECTED TWICE DAILY 200 each 1  . metFORMIN (GLUCOPHAGE-XR) 500 MG 24 hr tablet TAKE 2 TABLETS BY MOUTH TWICE DAILY 360 tablet 3  . metoprolol tartrate (LOPRESSOR) 50 MG tablet One and a half tablets twice daily 270 tablet 3  . Multiple Vitamin (MULTIVITAMIN WITH MINERALS) TABS tablet Take 1 tablet by mouth daily.    . nitroGLYCERIN (NITROSTAT) 0.4 MG SL tablet Place 1 tablet (0.4 mg total) under the tongue every 5 (five) minutes as needed for chest pain. 30 tablet o  . pantoprazole (PROTONIX) 40 MG tablet 1 po 30 min before first meal 90 tablet 3  . spironolactone (ALDACTONE) 25  MG tablet One and a half  Tablets once daily 135 tablet 3  . tiZANidine (ZANAFLEX) 2 MG tablet One tablet at bedtime, if needed, for muscle spasm (Patient not taking: Reported on 04/03/2018) 10 tablet 0   No current facility-administered medications on file prior to visit.     Social History   Socioeconomic History  . Marital status: Married    Spouse name: Not on file  . Number of children: Not on file  . Years of education: Not on file  . Highest education level: Not on file  Occupational History  . Not on file  Social Needs  . Financial resource strain: Not hard at all  . Food insecurity:    Worry: Never true    Inability: Never true  . Transportation needs:     Medical: No    Non-medical: No  Tobacco Use  . Smoking status: Never Smoker  . Smokeless tobacco: Never Used  Substance and Sexual Activity  . Alcohol use: No    Alcohol/week: 0.0 oz  . Drug use: No  . Sexual activity: Not Currently    Partners: Male  Lifestyle  . Physical activity:    Days per week: 5 days    Minutes per session: 40 min  . Stress: Not at all  Relationships  . Social connections:    Talks on phone: More than three times a week    Gets together: Once a week    Attends religious service: 1 to 4 times per year    Active member of club or organization: Yes    Attends meetings of clubs or organizations: More than 4 times per year    Relationship status: Married  . Intimate partner violence:    Fear of current or ex partner: No    Emotionally abused: No    Physically abused: No    Forced sexual activity: No  Other Topics Concern  . Not on file  Social History Narrative  . Not on file    Family History  Problem Relation Age of Onset  . Leukemia Mother   . Cancer Mother   . Prostate cancer Father   . Lung cancer Sister [id number]  . Diabetes Sister   . Colon cancer Neg Hx     BP (!) 158/75   Pulse 76   Ht 5' 5.5" (1.664 m)   Wt 188 lb (85.3 kg)   BMI 30.81 kg/m       Objective:   Physical Exam  Constitutional: She is oriented to person, place, and time. She appears well-developed and well-nourished.  HENT:  Head: Normocephalic and atraumatic.  Eyes: Pupils are equal, round, and reactive to light. Conjunctivae and EOM are normal.  Neck: Normal range of motion. Neck supple.  Cardiovascular: Normal rate, regular rhythm and intact distal pulses.  Pulmonary/Chest: Effort normal.  Abdominal: Soft.  Musculoskeletal:       Left knee: Tenderness found. Medial joint line tenderness noted.       Legs: Neurological: She is alert and oriented to person, place, and time. She has normal reflexes. She displays normal reflexes. No cranial nerve deficit. She exhibits  normal muscle tone. Coordination normal.  Skin: Skin is warm and dry.  Psychiatric: She has a normal mood and affect. Her behavior is normal. Judgment and thought content normal.    X-rays were done of the left knee, reported separately.      Assessment & Plan:   Encounter Diagnosis  Name Primary?  . Chronic  pain of left knee Yes   PROCEDURE NOTE:  The patient requests injections of the left knee , verbal consent was obtained.  The left knee was prepped appropriately after time out was performed.   Sterile technique was observed and injection of 1 cc of Depo-Medrol 40 mg with several cc's of plain xylocaine. Anesthesia was provided by ethyl chloride and a 20-gauge needle was used to inject the knee area. The injection was tolerated well.  A band aid dressing was applied.  The patient was advised to apply ice later today and tomorrow to the injection sight as needed.  I have told her that I am concerned about a medial meniscus tear.  If she has continued problem, then I will get a MRI.  Return in two weeks.  Call if any problem.  Precautions discussed.   Electronically Signed Darreld Mclean, MD 6/19/20199:39 AM

## 2018-04-23 ENCOUNTER — Ambulatory Visit (INDEPENDENT_AMBULATORY_CARE_PROVIDER_SITE_OTHER): Payer: Medicare Other | Admitting: Orthopaedic Surgery

## 2018-04-23 ENCOUNTER — Encounter: Payer: Self-pay | Admitting: Orthopaedic Surgery

## 2018-04-23 VITALS — BP 116/64 | HR 74 | Temp 98.1°F | Ht 67.0 in | Wt 187.0 lb

## 2018-04-23 DIAGNOSIS — M25562 Pain in left knee: Secondary | ICD-10-CM | POA: Diagnosis not present

## 2018-04-23 DIAGNOSIS — G8929 Other chronic pain: Secondary | ICD-10-CM | POA: Diagnosis not present

## 2018-04-23 NOTE — Progress Notes (Signed)
CC:  My knee is so much better  She is doing well with the left knee after the injection last time.  She has much less pain, more motion.  She has no new trauma.  NV intact. ROM 0 to 115 left knee, she has crepitus.  Gait is good.  Encounter Diagnosis  Name Primary?  . Chronic pain of left knee Yes   I will see her in one month.  Call if any problem.  Precautions discussed.   Electronically Signed Darreld McleanWayne Brynlyn Dade, MD 7/3/20198:46 AM

## 2018-05-15 ENCOUNTER — Other Ambulatory Visit: Payer: Self-pay

## 2018-05-15 ENCOUNTER — Encounter: Payer: Self-pay | Admitting: Family Medicine

## 2018-05-15 ENCOUNTER — Ambulatory Visit (INDEPENDENT_AMBULATORY_CARE_PROVIDER_SITE_OTHER): Payer: Medicare Other | Admitting: Family Medicine

## 2018-05-15 VITALS — BP 140/72 | HR 77 | Resp 12 | Ht 67.0 in | Wt 183.0 lb

## 2018-05-15 DIAGNOSIS — I10 Essential (primary) hypertension: Secondary | ICD-10-CM

## 2018-05-15 DIAGNOSIS — E559 Vitamin D deficiency, unspecified: Secondary | ICD-10-CM

## 2018-05-15 DIAGNOSIS — Z780 Asymptomatic menopausal state: Secondary | ICD-10-CM | POA: Diagnosis not present

## 2018-05-15 DIAGNOSIS — Z1231 Encounter for screening mammogram for malignant neoplasm of breast: Secondary | ICD-10-CM

## 2018-05-15 DIAGNOSIS — IMO0001 Reserved for inherently not codable concepts without codable children: Secondary | ICD-10-CM

## 2018-05-15 DIAGNOSIS — Z794 Long term (current) use of insulin: Secondary | ICD-10-CM | POA: Diagnosis not present

## 2018-05-15 DIAGNOSIS — E663 Overweight: Secondary | ICD-10-CM | POA: Diagnosis not present

## 2018-05-15 DIAGNOSIS — E119 Type 2 diabetes mellitus without complications: Secondary | ICD-10-CM | POA: Diagnosis not present

## 2018-05-15 DIAGNOSIS — E785 Hyperlipidemia, unspecified: Secondary | ICD-10-CM

## 2018-05-15 DIAGNOSIS — M1712 Unilateral primary osteoarthritis, left knee: Secondary | ICD-10-CM

## 2018-05-15 NOTE — Patient Instructions (Addendum)
F/U with MD  2nd week in October, call if you need me before  Plese schedule wellness with Nurse after Nov 20  Please schedule bone density test at checkout  Please schedule mamamogram due October 23 or after for patient   Continue current medication  Thankful bP and knee much better  Fasting lipid, cmp and EGFr, hBA1C, tSH and vit D level to be drawn last week in September  It is important that you exercise regularly at least 30 minutes 5 times a week. If you develop chest pain, have severe difficulty breathing, or feel very tired, stop exercising immediately and seek medical attention

## 2018-05-17 ENCOUNTER — Encounter: Payer: Self-pay | Admitting: Family Medicine

## 2018-05-17 NOTE — Assessment & Plan Note (Signed)
Improved Patient re-educated about  the importance of commitment to a  minimum of 150 minutes of exercise per week.  The importance of healthy food choices with portion control discussed. Encouraged to start a food diary, count calories and to consider  joining a support group. Sample diet sheets offered. Goals set by the patient for the next several months.   Weight /BMI 05/15/2018 04/23/2018 04/09/2018  WEIGHT 183 lb 187 lb 188 lb  HEIGHT 5\' 7"  5\' 7"  5' 5.5"  BMI 28.66 kg/m2 29.29 kg/m2 30.81 kg/m2

## 2018-05-17 NOTE — Assessment & Plan Note (Signed)
Hyperlipidemia:Low fat diet discussed and encouraged.   Lipid Panel  Lab Results  Component Value Date   CHOL 160 11/26/2017   HDL 52 11/26/2017   LDLCALC 85 11/26/2017   TRIG 124 11/26/2017   CHOLHDL 3.1 11/26/2017  Controlled, no change in medication Updated lab needed at next visit.

## 2018-05-17 NOTE — Assessment & Plan Note (Signed)
Marked improvement in pain and debility, management per ortho

## 2018-05-17 NOTE — Progress Notes (Signed)
Kimberly Freeman     MRN: [id number]      DOB: 02-02-1941   HPI Kimberly Freeman is here for follow up and re-evaluation of chronic medical conditions, medication management and review of any available recent lab and radiology data.  Preventive health is updated, specifically  Cancer screening and Immunization.   Had excellent results from injection into left knee by orthopedics and therapy, doug much better and back to bike riding The PT denies any adverse reactions to current medications since the last visit.  There are no new concerns.  There are no specific complaints  Denies polyuria, polydipsia, blurred vision , or hypoglycemic episodes.   ROS Denies recent fever or chills. Denies sinus pressure, nasal congestion, ear pain or sore throat. Denies chest congestion, productive cough or wheezing. Denies chest pains, palpitations and leg swelling Denies abdominal pain, nausea, vomiting,diarrhea or constipation.   Denies dysuria, frequency, hesitancy or incontinence. Denies uncontrolled oint pain, swelling an still has mild  limitation in mobility. Denies headaches, seizures, numbness, or tingling. Denies depression, anxiety or insomnia. Denies skin break down or rash.   PE  BP 140/72 (BP Location: Left Arm, Patient Position: Sitting, Cuff Size: Normal)   Pulse 77   Ht 5\' 7"  (1.702 m)   Wt 183 lb (83 kg)   SpO2 98%   BMI 28.66 kg/m   Patient alert and oriented and in no cardiopulmonary distress.  HEENT: No facial asymmetry, EOMI,   oropharynx pink and moist.  Neck supple no JVD, no mass.  Chest: Clear to auscultation bilaterally.  CVS: S1, S2 no murmurs, no S3.Regular rate.  ABD: Soft non tender.   Ext: No edema  MS: Adequate though reduced  ROM spine, shoulders, hips and knees.  Skin: Intact, no ulcerations or rash noted.  Psych: Good eye contact, normal affect. Memory intact not anxious or depressed appearing.  CNS: CN 2-12 intact, power,  normal throughout.no focal  deficits noted.   Assessment & Plan Malignant hypertension Improved, continue current medications DASH diet and commitment to daily physical activity for a minimum of 30 minutes discussed and encouraged, as a part of hypertension management. The importance of attaining a healthy weight is also discussed.  BP/Weight 05/15/2018 04/23/2018 04/09/2018 04/03/2018 02/05/2018 01/07/2018 11/28/2017  Systolic BP 140 116 158 170 150 140 146  Diastolic BP 72 64 75 84 80 70 76  Wt. (Lbs) 183 187 188 189 188 - 190  BMI 28.66 29.29 30.81 29.6 29.44 - 29.76       Diabetes mellitus, insulin dependent (IDDM), controlled (HCC) Controlled, no change in medication Kimberly Freeman is reminded of the importance of commitment to daily physical activity for 30 minutes or more, as able and the need to limit carbohydrate intake to 30 to 60 grams per meal to help with blood sugar control.   The need to take medication as prescribed, test blood sugar as directed, and to call between visits if there is a concern that blood sugar is uncontrolled is also discussed.   Kimberly Freeman is reminded of the importance of daily foot exam, annual eye examination, and good blood sugar, blood pressure and cholesterol control.  Diabetic Labs Latest Ref Rng & Units 04/02/2018 11/27/2017 11/26/2017 11/22/2017 07/23/2017  HbA1c <5.7 % of total Hgb 7.5(H) - 7.3(H) - 7.0(H)  Microalbumin mg/dL 0.7 1.3(Y8.8(H) - - -  Micro/Creat Ratio <30 mcg/mg creat 6 5.1 - - -  Chol <200 mg/dL - - 865160 - -  HDL >78>50 mg/dL - -  52 - -  Calc LDL mg/dL (calc) - - 85 - -  Triglycerides <150 mg/dL - - 161 - -  Creatinine 0.60 - 0.93 mg/dL 0.96 - 0.45(W) 0.98(J) 1.04(H)   BP/Weight 05/15/2018 04/23/2018 04/09/2018 04/03/2018 02/05/2018 01/07/2018 11/28/2017  Systolic BP 140 116 158 170 150 140 146  Diastolic BP 72 64 75 84 80 70 76  Wt. (Lbs) 183 187 188 189 188 - 190  BMI 28.66 29.29 30.81 29.6 29.44 - 29.76   Foot/eye exam completion dates Latest Ref Rng & Units 07/25/2017  04/11/2016  Eye Exam No Retinopathy - -  Foot exam Order - - -  Foot Form Completion - Done Done        Overweight (BMI 25.0-29.9) Improved Patient re-educated about  the importance of commitment to a  minimum of 150 minutes of exercise per week.  The importance of healthy food choices with portion control discussed. Encouraged to start a food diary, count calories and to consider  joining a support group. Sample diet sheets offered. Goals set by the patient for the next several months.   Weight /BMI 05/15/2018 04/23/2018 04/09/2018  WEIGHT 183 lb 187 lb 188 lb  HEIGHT 5\' 7"  5\' 7"  5' 5.5"  BMI 28.66 kg/m2 29.29 kg/m2 30.81 kg/m2      Hyperlipidemia LDL goal <100 Hyperlipidemia:Low fat diet discussed and encouraged.   Lipid Panel  Lab Results  Component Value Date   CHOL 160 11/26/2017   HDL 52 11/26/2017   LDLCALC 85 11/26/2017   TRIG 124 11/26/2017   CHOLHDL 3.1 11/26/2017  Controlled, no change in medication Updated lab needed at next visit.      Arthritis of left knee Marked improvement in pain and debility, management per ortho

## 2018-05-17 NOTE — Assessment & Plan Note (Signed)
Improved, continue current medications DASH diet and commitment to daily physical activity for a minimum of 30 minutes discussed and encouraged, as a part of hypertension management. The importance of attaining a healthy weight is also discussed.  BP/Weight 05/15/2018 04/23/2018 04/09/2018 04/03/2018 02/05/2018 01/07/2018 11/28/2017  Systolic BP 140 116 158 170 150 140 146  Diastolic BP 72 64 75 84 80 70 76  Wt. (Lbs) 183 187 188 189 188 - 190  BMI 28.66 29.29 30.81 29.6 29.44 - 29.76

## 2018-05-17 NOTE — Assessment & Plan Note (Signed)
Controlled, no change in medication Kimberly Freeman is reminded of the importance of commitment to daily physical activity for 30 minutes or more, as able and the need to limit carbohydrate intake to 30 to 60 grams per meal to help with blood sugar control.   The need to take medication as prescribed, test blood sugar as directed, and to call between visits if there is a concern that blood sugar is uncontrolled is also discussed.   Kimberly Freeman is reminded of the importance of daily foot exam, annual eye examination, and good blood sugar, blood pressure and cholesterol control.  Diabetic Labs Latest Ref Rng & Units 04/02/2018 11/27/2017 11/26/2017 11/22/2017 07/23/2017  HbA1c <5.7 % of total Hgb 7.5(H) - 7.3(H) - 7.0(H)  Microalbumin mg/dL 0.7 1.6(X8.8(H) - - -  Micro/Creat Ratio <30 mcg/mg creat 6 5.1 - - -  Chol <200 mg/dL - - 096160 - -  HDL >04>50 mg/dL - - 52 - -  Calc LDL mg/dL (calc) - - 85 - -  Triglycerides <150 mg/dL - - 540124 - -  Creatinine 0.60 - 0.93 mg/dL 9.810.90 - 1.91(Y1.13(H) 7.82(N1.01(H) 1.04(H)   BP/Weight 05/15/2018 04/23/2018 04/09/2018 04/03/2018 02/05/2018 01/07/2018 11/28/2017  Systolic BP 140 116 158 170 150 140 146  Diastolic BP 72 64 75 84 80 70 76  Wt. (Lbs) 183 187 188 189 188 - 190  BMI 28.66 29.29 30.81 29.6 29.44 - 29.76   Foot/eye exam completion dates Latest Ref Rng & Units 07/25/2017 04/11/2016  Eye Exam No Retinopathy - -  Foot exam Order - - -  Foot Form Completion - Done Done

## 2018-05-21 ENCOUNTER — Encounter: Payer: Self-pay | Admitting: Orthopaedic Surgery

## 2018-05-21 ENCOUNTER — Ambulatory Visit (INDEPENDENT_AMBULATORY_CARE_PROVIDER_SITE_OTHER): Payer: Medicare Other | Admitting: Orthopaedic Surgery

## 2018-05-21 VITALS — BP 101/57 | HR 80 | Ht 67.0 in | Wt 185.0 lb

## 2018-05-21 DIAGNOSIS — G8929 Other chronic pain: Secondary | ICD-10-CM | POA: Diagnosis not present

## 2018-05-21 DIAGNOSIS — M25562 Pain in left knee: Secondary | ICD-10-CM | POA: Diagnosis not present

## 2018-05-21 NOTE — Progress Notes (Signed)
Patient Kimberly Freeman, female DOB:21-Sep-1941, 77 y.o. JYN:[id number]  Chief Complaint  Patient presents with  . Follow-up    Left knee pain    HPI  Kimberly Freeman is a 77 y.o. female who has left knee pain. She is having less pain after the injection to the left knee last visit.  She has swelling still and popping but she is walking better. She has some posterior lateral tenderness at times.  I recommended she use Aspercreme to the area as needed.   Body mass index is 28.98 kg/m.  ROS  Review of Systems  Respiratory: Negative for cough and shortness of breath.   Cardiovascular: Negative for chest pain and leg swelling.  Musculoskeletal: Positive for arthralgias, gait problem and joint swelling.  All other systems reviewed and are negative.   All other systems reviewed and are negative.  Past Medical History:  Diagnosis Date  . Arthritis   . Diabetes mellitus, type 2 (HCC)   . Essential hypertension   . GERD (gastroesophageal reflux disease)   . Hyperlipidemia     Past Surgical History:  Procedure Laterality Date  . ABDOMINAL HYSTERECTOMY    . BIOPSY  07/18/2017   Procedure: BIOPSY;  Surgeon: [name], MD;  [facility];  Service: Endoscopy;;  gastric  . CHOLECYSTECTOMY  2004  . COLONOSCOPY N/A 07/18/2017   Dr. [name]: Internal and external hemorrhoids.  No future screening/surveillance colonoscopies due to age.  . ESOPHAGOGASTRODUODENOSCOPY N/A 07/18/2017   Dr. [name]: Small hiatal hernia, patchy mild inflammation characterized by congestion, erosions, erythema in the cardia, gastric body and antrum.  Diffuse moderate inflammation in the duodenal bulb and second portion duodenum.  Biopsies benign.  No H. pylori.    Family History  Problem Relation Age of Onset  . Leukemia Mother   . Cancer Mother   . Prostate cancer Father   . Lung cancer Sister 45  . Diabetes Sister   . Colon cancer Neg Hx     Social History Social History   Tobacco Use   . Smoking status: Never Smoker  . Smokeless tobacco: Never Used  Substance Use Topics  . Alcohol use: No    Alcohol/week: 0.0 oz  . Drug use: No    Allergies  Allergen Reactions  . Poison Oak Extract [Poison Oak Extract] Hives  . Sulfonamide Derivatives Itching    Burning sensation all over body    Current Outpatient Medications  Medication Sig Dispense Refill  . acetaminophen (TYLENOL) 500 MG tablet Take 500 mg by mouth daily as needed for mild pain or moderate pain. For pain     . aspirin 325 MG tablet Take 325 mg by mouth every morning.    Marland Kitchen atorvastatin (LIPITOR) 40 MG tablet TAKE 1 TABLET BY MOUTH AT BEDTIME 90 tablet 3  . glipiZIDE (GLUCOTROL XL) 5 MG 24 hr tablet TAKE 1 TABLET BY MOUTH DAILY 90 tablet 3  . glucose blood test strip (Onetouch Ultrablue ) Use as instructed for twice daily testing 300 each 1  . Insulin Glargine (BASAGLAR KWIKPEN) 100 UNIT/ML SOPN Inject 0.5 mLs (50 Units total) into the skin at bedtime. 45 mL 1  . 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 irbesartan (AVAPRO) 300 MG tablet TAKE 1 TABLET BY MOUTH DAILY 90 tablet 3  . Lancets (ONETOUCH ULTRASOFT) lancets USE AS DIRECTED TWICE DAILY 200 each 1  . metFORMIN (GLUCOPHAGE-XR) 500 MG  24 hr tablet TAKE 2 TABLETS BY MOUTH TWICE DAILY 360 tablet 3  . metoprolol tartrate (LOPRESSOR) 50 MG tablet One and a half tablets twice daily 270 tablet 3  . Multiple Vitamin (MULTIVITAMIN WITH MINERALS) TABS tablet Take 1 tablet by mouth daily.    . nitroGLYCERIN (NITROSTAT) 0.4 MG SL tablet Place 1 tablet (0.4 mg total) under the tongue every 5 (five) minutes as needed for chest pain. 30 tablet o  . pantoprazole (PROTONIX) 40 MG tablet 1 po 30 min before first meal 90 tablet 3  . spironolactone (ALDACTONE) 25 MG tablet One and a half  Tablets once daily 135 tablet 3  . tiZANidine (ZANAFLEX) 2 MG tablet One tablet at bedtime, if needed, for muscle spasm 10 tablet 0   No  current facility-administered medications for this visit.      Physical Exam  Blood pressure (!) 101/57, pulse 80, height 5\' 7"  (1.702 m), weight 185 lb (83.9 kg).  Constitutional: overall normal hygiene, normal nutrition, well developed, normal grooming, normal body habitus. Assistive device:none  Musculoskeletal: gait and station Limp left, muscle tone and strength are normal, no tremors or atrophy is present.  .  Neurological: coordination overall normal.  Deep tendon reflex/nerve stretch intact.  Sensation normal.  Cranial nerves II-XII intact.   Skin:   Normal overall no scars, lesions, ulcers or rashes. No psoriasis.  Psychiatric: Alert and oriented x 3.  Recent memory intact, remote memory unclear.  Normal mood and affect. Well groomed.  Good eye contact.  Cardiovascular: overall no swelling, no varicosities, no edema bilaterally, normal temperatures of the legs and arms, no clubbing, cyanosis and good capillary refill.  Lymphatic: palpation is normal.  Left knee has ROM of 0 to 110, crepitus, slight effusion, no redness, slight limp to the left, NV intact.  All other systems reviewed and are negative   The patient has been educated about the nature of the problem(s) and counseled on treatment options.  The patient appeared to understand what I have discussed and is in agreement with it.  Encounter Diagnosis  Name Primary?  . Chronic pain of left knee Yes    PLAN Call if any problems.  Precautions discussed.  Continue current medications.   Return to clinic 1 month   Electronically Signed Darreld McleanWayne Meryn Sarracino, MD 7/31/20199:21 AM

## 2018-05-22 ENCOUNTER — Ambulatory Visit: Payer: Medicare Other | Admitting: Orthopaedic Surgery

## 2018-05-27 ENCOUNTER — Other Ambulatory Visit (HOSPITAL_COMMUNITY): Payer: Self-pay | Admitting: *Deleted

## 2018-05-27 DIAGNOSIS — D649 Anemia, unspecified: Secondary | ICD-10-CM

## 2018-05-28 ENCOUNTER — Ambulatory Visit (HOSPITAL_COMMUNITY): Payer: Medicare Other | Admitting: Hematology

## 2018-05-30 ENCOUNTER — Inpatient Hospital Stay (HOSPITAL_COMMUNITY): Payer: Medicare Other | Attending: Hematology

## 2018-05-30 DIAGNOSIS — N189 Chronic kidney disease, unspecified: Secondary | ICD-10-CM | POA: Diagnosis not present

## 2018-05-30 DIAGNOSIS — D649 Anemia, unspecified: Secondary | ICD-10-CM

## 2018-05-30 LAB — CBC WITH DIFFERENTIAL/PLATELET
BASOS ABS: 0 10*3/uL (ref 0.0–0.1)
BASOS PCT: 1 %
EOS ABS: 0.2 10*3/uL (ref 0.0–0.7)
Eosinophils Relative: 4 %
HEMATOCRIT: 34.3 % — AB (ref 36.0–46.0)
HEMOGLOBIN: 11 g/dL — AB (ref 12.0–15.0)
Lymphocytes Relative: 42 %
Lymphs Abs: 2.4 10*3/uL (ref 0.7–4.0)
MCH: 29.8 pg (ref 26.0–34.0)
MCHC: 32.1 g/dL (ref 30.0–36.0)
MCV: 93 fL (ref 78.0–100.0)
Monocytes Absolute: 0.4 10*3/uL (ref 0.1–1.0)
Monocytes Relative: 8 %
NEUTROS ABS: 2.6 10*3/uL (ref 1.7–7.7)
NEUTROS PCT: 45 %
Platelets: 211 10*3/uL (ref 150–400)
RBC: 3.69 MIL/uL — AB (ref 3.87–5.11)
RDW: 13.2 % (ref 11.5–15.5)
WBC: 5.6 10*3/uL (ref 4.0–10.5)

## 2018-05-30 LAB — BASIC METABOLIC PANEL
Anion gap: 6 (ref 5–15)
BUN: 19 mg/dL (ref 8–23)
CO2: 26 mmol/L (ref 22–32)
Calcium: 9.6 mg/dL (ref 8.9–10.3)
Chloride: 107 mmol/L (ref 98–111)
Creatinine, Ser: 1.06 mg/dL — ABNORMAL HIGH (ref 0.44–1.00)
GFR, EST AFRICAN AMERICAN: 56 mL/min — AB (ref >60)
GFR, EST NON AFRICAN AMERICAN: 49 mL/min — AB (ref >60)
GLUCOSE: 194 mg/dL — AB (ref 70–99)
Potassium: 4.5 mmol/L (ref 3.5–5.1)
Sodium: 139 mmol/L (ref 135–145)

## 2018-05-30 LAB — IRON AND TIBC
Iron: 80 ug/dL (ref 28–170)
SATURATION RATIOS: 24 % (ref 10.4–31.8)
TIBC: 336 ug/dL (ref 250–450)
UIBC: 256 ug/dL

## 2018-05-30 LAB — FERRITIN: Ferritin: 117 ng/mL (ref 11–307)

## 2018-06-04 ENCOUNTER — Other Ambulatory Visit: Payer: Self-pay

## 2018-06-04 ENCOUNTER — Encounter (HOSPITAL_COMMUNITY): Payer: Self-pay | Admitting: Hematology

## 2018-06-04 ENCOUNTER — Inpatient Hospital Stay (HOSPITAL_BASED_OUTPATIENT_CLINIC_OR_DEPARTMENT_OTHER): Payer: Medicare Other | Admitting: Hematology

## 2018-06-04 VITALS — BP 149/53 | HR 71 | Temp 98.2°F | Resp 18 | Wt 188.4 lb

## 2018-06-04 DIAGNOSIS — N189 Chronic kidney disease, unspecified: Secondary | ICD-10-CM

## 2018-06-04 DIAGNOSIS — D649 Anemia, unspecified: Secondary | ICD-10-CM

## 2018-06-04 DIAGNOSIS — D500 Iron deficiency anemia secondary to blood loss (chronic): Secondary | ICD-10-CM

## 2018-06-04 NOTE — Progress Notes (Signed)
[facility] [address] [place] [place], [place] [address]   CLINIC:  Medical Oncology/Hematology  PCP:  Kerri PerchesSimpson, Margaret E, MD [address] [place] [address] [phone]   REASON FOR VISIT:  Follow-up for Anemia  CURRENT THERAPY: Observation   INTERVAL HISTORY:  [name] 77 y.o. female returns for routine follow-up for anemia. Patient is doing well sincer her last visit. Patient denies any weight loss. Denies any bleeding or blood in her stool. Denies any dark stools. Denies any ankle swelling.  Denies any fevers, night sweats or weight loss.  Denies any recent transfusions.  Denies any recent hospitalizations.   REVIEW OF SYSTEMS:  Review of Systems  Gastrointestinal: Positive for constipation.  Endocrine: Positive for hot flashes.  All other systems reviewed and are negative.    PAST MEDICAL/SURGICAL HISTORY:  Past Medical History:  Diagnosis Date  . Arthritis   . Diabetes mellitus, type 2 (HCC)   . Essential hypertension   . GERD (gastroesophageal reflux disease)   . Hyperlipidemia    Past Surgical History:  Procedure Laterality Date  . ABDOMINAL HYSTERECTOMY    . BIOPSY  07/18/2017   Procedure: BIOPSY;  Surgeon: West BaliFields, Sandi L, MD;  [facility];  Service: Endoscopy;;  gastric  . CHOLECYSTECTOMY  2004  . COLONOSCOPY N/A 07/18/2017   Dr. Darrick Pennafields: Internal and external hemorrhoids.  No future screening/surveillance colonoscopies due to age.  . ESOPHAGOGASTRODUODENOSCOPY N/A 07/18/2017   Dr. Darrick Pennafields: Small hiatal hernia, patchy mild inflammation characterized by congestion, erosions, erythema in the cardia, gastric body and antrum.  Diffuse moderate inflammation in the duodenal bulb and second portion duodenum.  Biopsies benign.  No H. pylori.     SOCIAL HISTORY:  Social History   Socioeconomic History  . Marital status: Married    Spouse name: Not on file  . Number of children: Not on file  . Years of education: Not  on file  . Highest education level: Not on file  Occupational History  . Not on file  Social Needs  . Financial resource strain: Not hard at all  . Food insecurity:    Worry: Never true    Inability: Never true  . Transportation needs:    Medical: No    Non-medical: No  Tobacco Use  . Smoking status: Never Smoker  . Smokeless tobacco: Never Used  Substance and Sexual Activity  . Alcohol use: No    Alcohol/week: 0.0 standard drinks  . Drug use: No  . Sexual activity: Not Currently    Partners: Male  Lifestyle  . Physical activity:    Days per week: 5 days    Minutes per session: 40 min  . Stress: Not at all  Relationships  . Social connections:    Talks on phone: More than three times a week    Gets together: Once a week    Attends religious service: 1 to 4 times per year    Active member of club or organization: Yes    Attends meetings of clubs or organizations: More than 4 times per year    Relationship status: Married  . Intimate partner violence:    Fear of current or ex partner: No    Emotionally abused: No    Physically abused: No    Forced sexual activity: No  Other Topics Concern  . Not on file  Social History Narrative  . Not on file    FAMILY HISTORY:  Family History  Problem Relation Age of Onset  . Leukemia Mother   . Cancer Mother   . Prostate cancer Father   . Lung cancer Sister 53  . Diabetes Sister   . Colon cancer Neg Hx     CURRENT MEDICATIONS:  Outpatient Encounter Medications as of 06/04/2018  Medication Sig Note  . acetaminophen (TYLENOL) 500 MG tablet Take 500 mg by mouth daily as needed for mild pain or moderate pain. For pain    . aspirin 325 MG tablet Take 325 mg by mouth every morning.   Marland Kitchen atorvastatin (LIPITOR) 40 MG tablet TAKE 1 TABLET BY MOUTH AT BEDTIME   . glipiZIDE (GLUCOTROL XL) 5 MG 24 hr tablet TAKE 1 TABLET BY MOUTH DAILY   . glucose blood test strip (Onetouch Ultrablue ) Use as instructed for twice daily testing   .  Insulin Glargine (BASAGLAR KWIKPEN) 100 UNIT/ML SOPN Inject 0.5 mLs (50 Units total) into the skin at bedtime. 11/27/2017: 45 units   . Insulin Pen Needle 32G X 6 MM MISC Use to inject insulin daily dx e11.65   . irbesartan (AVAPRO) 300 MG tablet TAKE 1 TABLET BY MOUTH DAILY   . Lancets (ONETOUCH ULTRASOFT) lancets USE AS DIRECTED TWICE DAILY   . metFORMIN (GLUCOPHAGE-XR) 500 MG 24 hr tablet TAKE 2 TABLETS BY MOUTH TWICE DAILY   . metoprolol tartrate (LOPRESSOR) 50 MG tablet One and a half tablets twice daily   . Multiple Vitamin (MULTIVITAMIN WITH MINERALS) TABS tablet Take 1 tablet by mouth daily.   . nitroGLYCERIN (NITROSTAT) 0.4 MG SL tablet Place 1 tablet (0.4 mg total) under the tongue every 5 (five) minutes as needed for chest pain.   . pantoprazole (PROTONIX) 40 MG tablet 1 po 30 min before first meal   . spironolactone (ALDACTONE) 25 MG tablet One and a half  Tablets once daily   . tiZANidine (ZANAFLEX) 2 MG tablet One tablet at bedtime, if needed, for muscle spasm    No facility-administered encounter medications on file as of 06/04/2018.     ALLERGIES:  Allergies  Allergen Reactions  . Poison Oak Extract [Poison Oak Extract] Hives  . Sulfonamide Derivatives Itching    Burning sensation all over body     PHYSICAL EXAM:  ECOG Performance status: 1  Vitals:   06/04/18 1555  BP: (!) 149/53  Pulse: 71  Resp: 18  Temp: 98.2 F (36.8 C)  SpO2: 100%   Filed Weights   06/04/18 1555  Weight: 188 lb 6.4 oz (85.5 kg)    Physical Exam HEENT: Oropharynx has no lesions or thrush. Chest: Bilateral clear to auscultation. CVS: S1-S2 regular rate and rhythm. Abdomen: No palpable left or splenomegaly. Extremities: No edema or cyanosis.  LABORATORY DATA:  I have reviewed the labs as listed.  CBC    Component Value Date/Time   WBC 5.6 05/30/2018 1222   RBC 3.69 (L) 05/30/2018 1222   HGB 11.0 (L) 05/30/2018 1222   HCT 34.3 (L) 05/30/2018 1222   PLT 211 05/30/2018 1222   MCV  93.0 05/30/2018 1222   MCH 29.8 05/30/2018 1222   MCHC 32.1 05/30/2018 1222   RDW 13.2 05/30/2018 1222   LYMPHSABS 2.4 05/30/2018 1222   MONOABS 0.4 05/30/2018 1222   EOSABS 0.2 05/30/2018 1222   BASOSABS 0.0 05/30/2018 1222   CMP Latest Ref Rng & Units 05/30/2018 04/02/2018 11/26/2017  Glucose 70 - 99 mg/dL 811(B) 147(W) 89  BUN 8 - 23 mg/dL 19 15 15   Creatinine 0.44 - 1.00  mg/dL 1.61(W1.06(H) 9.600.90 4.54(U1.13(H)  Sodium 135 - 145 mmol/L 139 141 142  Potassium 3.5 - 5.1 mmol/L 4.5 4.5 3.8  Chloride 98 - 111 mmol/L 107 104 106  CO2 22 - 32 mmol/L 26 30 31   Calcium 8.9 - 10.3 mg/dL 9.6 98.110.0 9.9  Total Protein 6.1 - 8.1 g/dL - - 7.0  Total Bilirubin 0.2 - 1.2 mg/dL - - 0.5  Alkaline Phos 38 - 126 U/L - - -  AST 10 - 35 U/L - - 26  ALT 6 - 29 U/L - - 29       ASSESSMENT & PLAN:   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  Normocytic anemia: -Previously thought to be from chronic blood loss.  There is also component of chronic kidney disease. - EGD on 07/18/2017 showed gastritis, duodenitis with no H. pylori. -Colonoscopy on 07/18/2017 showed internal and external hemorrhoids. -She denies any bleeding per rectum or melena.  No B symptoms or recent hospitalizations or recent transfusions. -We reviewed the results of CBC which showed hemoglobin of 11 with an MCV of 93.  Ferritin was 179% saturation was 24. -Creatinine was mildly elevated at 1.06. - We will see her back in 6 months for follow-up.  We will repeat ferritin and iron panel, B12 and folic acid prior to next visit.      Orders placed this encounter:  Orders Placed This Encounter  Procedures  . CBC with Differential/Platelet  . Comprehensive metabolic panel  . Ferritin  . Iron and TIBC  . Vitamin B12  . Folate      Doreatha MassedSreedhar Katragadda, MD [facility] [phone]

## 2018-06-04 NOTE — Patient Instructions (Signed)
[facility] at [facility] Discharge Instructions  Follow up in 6 months with labs a few days prior.    Thank you for choosing [facility] at [facility] to provide your oncology and hematology care.  To afford each patient quality time with our provider, please arrive at least 15 minutes before your scheduled appointment time.   If you have a lab appointment with the Cancer Center please come in thru the  Main Entrance and check in at the main information desk  You need to re-schedule your appointment should you arrive 10 or more minutes late.  We strive to give you quality time with our providers, and arriving late affects you and other patients whose appointments are after yours.  Also, if you no show three or more times for appointments you may be dismissed from the clinic at the providers discretion.     Again, thank you for choosing [facility].  Our hope is that these requests will decrease the amount of time that you wait before being seen by our physicians.       _____________________________________________________________  Should you have questions after your visit to [facility], please contact our office at [phone] between the hours of 8:00 a.m. and 4:30 p.m.  Voicemails left after 4:00 p.m. will not be returned until the following business day.  For prescription refill requests, have your pharmacy contact our office and allow 72 hours.    Cancer Center Support Programs:   > Cancer Support Group  2nd Tuesday of the month 1pm-2pm, Journey Room    

## 2018-06-04 NOTE — Assessment & Plan Note (Signed)
1.  Normocytic anemia: -Previously thought to be from chronic blood loss.  There is also component of chronic kidney disease. - EGD on 07/18/2017 showed gastritis, duodenitis with no H. pylori. -Colonoscopy on 07/18/2017 showed internal and external hemorrhoids. -She denies any bleeding per rectum or melena.  No B symptoms or recent hospitalizations or recent transfusions. -We reviewed the results of CBC which showed hemoglobin of 11 with an MCV of 93.  Ferritin was 179% saturation was 24. -Creatinine was mildly elevated at 1.06. - We will see her back in 6 months for follow-up.  We will repeat ferritin and iron panel, B12 and folic acid prior to next visit.

## 2018-06-10 DIAGNOSIS — B351 Tinea unguium: Secondary | ICD-10-CM | POA: Diagnosis not present

## 2018-06-10 DIAGNOSIS — E1142 Type 2 diabetes mellitus with diabetic polyneuropathy: Secondary | ICD-10-CM | POA: Diagnosis not present

## 2018-06-12 ENCOUNTER — Other Ambulatory Visit: Payer: Self-pay | Admitting: Family Medicine

## 2018-06-18 ENCOUNTER — Encounter: Payer: Self-pay | Admitting: Orthopaedic Surgery

## 2018-06-18 ENCOUNTER — Ambulatory Visit (INDEPENDENT_AMBULATORY_CARE_PROVIDER_SITE_OTHER): Payer: Medicare Other | Admitting: Orthopaedic Surgery

## 2018-06-18 VITALS — BP 134/60 | HR 74 | Ht 67.0 in | Wt 189.0 lb

## 2018-06-18 DIAGNOSIS — M25562 Pain in left knee: Secondary | ICD-10-CM

## 2018-06-18 DIAGNOSIS — G8929 Other chronic pain: Secondary | ICD-10-CM

## 2018-06-18 NOTE — Progress Notes (Signed)
CC:  I feel so much better  She has just a twinge of pain in the left knee now. She is doing very well.  Gait is normal.  ROM is 0 to 115 on the left.  NV intact.  Encounter Diagnosis  Name Primary?  . Chronic pain of left knee Yes   I will see her as needed.  Call if any problem.  Precautions discussed.   Electronically Signed Darreld McleanWayne Lollie Gunner, MD 8/28/20199:18 AM

## 2018-06-20 DIAGNOSIS — M545 Low back pain: Secondary | ICD-10-CM | POA: Diagnosis not present

## 2018-06-24 ENCOUNTER — Ambulatory Visit: Payer: Medicare Other | Admitting: Family Medicine

## 2018-07-23 ENCOUNTER — Ambulatory Visit: Payer: Medicare Other

## 2018-07-23 ENCOUNTER — Ambulatory Visit (INDEPENDENT_AMBULATORY_CARE_PROVIDER_SITE_OTHER): Payer: Medicare Other

## 2018-07-23 DIAGNOSIS — Z23 Encounter for immunization: Secondary | ICD-10-CM | POA: Diagnosis not present

## 2018-08-04 DIAGNOSIS — E785 Hyperlipidemia, unspecified: Secondary | ICD-10-CM | POA: Diagnosis not present

## 2018-08-04 DIAGNOSIS — Z794 Long term (current) use of insulin: Secondary | ICD-10-CM | POA: Diagnosis not present

## 2018-08-04 DIAGNOSIS — E559 Vitamin D deficiency, unspecified: Secondary | ICD-10-CM | POA: Diagnosis not present

## 2018-08-04 DIAGNOSIS — E119 Type 2 diabetes mellitus without complications: Secondary | ICD-10-CM | POA: Diagnosis not present

## 2018-08-04 DIAGNOSIS — I10 Essential (primary) hypertension: Secondary | ICD-10-CM | POA: Diagnosis not present

## 2018-08-05 ENCOUNTER — Encounter: Payer: Self-pay | Admitting: Family Medicine

## 2018-08-05 ENCOUNTER — Ambulatory Visit: Payer: Medicare Other | Admitting: Family Medicine

## 2018-08-05 VITALS — BP 130/60 | HR 65 | Resp 12 | Ht 67.0 in | Wt 186.0 lb

## 2018-08-05 DIAGNOSIS — Z794 Long term (current) use of insulin: Secondary | ICD-10-CM | POA: Diagnosis not present

## 2018-08-05 DIAGNOSIS — IMO0001 Reserved for inherently not codable concepts without codable children: Secondary | ICD-10-CM

## 2018-08-05 DIAGNOSIS — E785 Hyperlipidemia, unspecified: Secondary | ICD-10-CM

## 2018-08-05 DIAGNOSIS — I10 Essential (primary) hypertension: Secondary | ICD-10-CM

## 2018-08-05 DIAGNOSIS — E119 Type 2 diabetes mellitus without complications: Secondary | ICD-10-CM

## 2018-08-05 DIAGNOSIS — F439 Reaction to severe stress, unspecified: Secondary | ICD-10-CM | POA: Diagnosis not present

## 2018-08-05 LAB — COMPLETE METABOLIC PANEL WITH GFR
AG Ratio: 1.8 (calc) (ref 1.0–2.5)
ALT: 14 U/L (ref 6–29)
AST: 16 U/L (ref 10–35)
Albumin: 4.3 g/dL (ref 3.6–5.1)
Alkaline phosphatase (APISO): 70 U/L (ref 33–130)
BUN: 15 mg/dL (ref 7–25)
CO2: 28 mmol/L (ref 20–32)
Calcium: 9.5 mg/dL (ref 8.6–10.4)
Chloride: 109 mmol/L (ref 98–110)
Creat: 0.91 mg/dL (ref 0.60–0.93)
GFR, EST AFRICAN AMERICAN: 70 mL/min/{1.73_m2} (ref >=60)
GFR, Est Non African American: 60 mL/min/{1.73_m2} (ref >=60)
Globulin: 2.4 g/dL (calc) (ref 1.9–3.7)
Glucose, Bld: 84 mg/dL (ref 65–99)
Potassium: 3.8 mmol/L (ref 3.5–5.3)
Sodium: 143 mmol/L (ref 135–146)
TOTAL PROTEIN: 6.7 g/dL (ref 6.1–8.1)
Total Bilirubin: 0.4 mg/dL (ref 0.2–1.2)

## 2018-08-05 LAB — LIPID PANEL
Cholesterol: 138 mg/dL (ref <200)
HDL: 43 mg/dL — AB (ref >50)
LDL Cholesterol (Calc): 78 mg/dL (calc)
NON-HDL CHOLESTEROL (CALC): 95 mg/dL (ref <130)
Total CHOL/HDL Ratio: 3.2 (calc) (ref <5.0)
Triglycerides: 90 mg/dL (ref <150)

## 2018-08-05 LAB — HEMOGLOBIN A1C
EAG (MMOL/L): 8.7 (calc)
HEMOGLOBIN A1C: 7.1 %{Hb} — AB (ref <5.7)
Mean Plasma Glucose: 157 (calc)

## 2018-08-05 LAB — TEST AUTHORIZATION

## 2018-08-05 LAB — TSH: TSH: 5.19 mIU/L — ABNORMAL HIGH (ref 0.40–4.50)

## 2018-08-05 LAB — T4, FREE: Free T4: 0.9 ng/dL (ref 0.8–1.8)

## 2018-08-05 LAB — VITAMIN D 25 HYDROXY (VIT D DEFICIENCY, FRACTURES): Vit D, 25-Hydroxy: 47 ng/mL (ref 30–100)

## 2018-08-05 LAB — T3, FREE: T3, Free: 2.7 pg/mL (ref 2.3–4.2)

## 2018-08-05 MED ORDER — NITROGLYCERIN 0.4 MG SL SUBL: 0.4000 mg | SUBLINGUAL_TABLET | SUBLINGUAL | Status: DC | PRN

## 2018-08-05 NOTE — Patient Instructions (Addendum)
Keep wellness visit.  MD follow 2nf d wek in  May, call if you need me before, and please call in April for lab order   Please  Get  Non fasting hBA1C and chem 7  And EGFR, end January  CONGRATS, keep up the great work!  Stop NTG, no  need to refill  Foot exam good overall, continue to examine feet every day   Please sched your eye exam which is overdue  Thank you  for choosing [facility]. We consider it a privelige to serve you.  Delivering excellent health care in a caring and  compassionate way is our goal.  Partnering with you,  so that together we can achieve this goal is our strategy.

## 2018-08-07 ENCOUNTER — Telehealth: Payer: Self-pay | Admitting: Gastroenterology

## 2018-08-07 NOTE — Telephone Encounter (Signed)
[phone]  PATIENT NEEDS HER PRESCRIPTION OF PANTOPRAZOLE SENT TO [facility]

## 2018-08-07 NOTE — Telephone Encounter (Signed)
Forwarding to refill and the pharmacy is already listed.

## 2018-08-11 MED ORDER — PANTOPRAZOLE SODIUM 40 MG PO TBEC: DELAYED_RELEASE_TABLET | ORAL | 3 refills | Status: DC

## 2018-08-11 NOTE — Telephone Encounter (Signed)
  Forwarding to [facility] refill again.

## 2018-08-11 NOTE — Telephone Encounter (Signed)
This is not showing up in refills for some reason. Forwarding to Lewie Loron, NP who is doing refills this week.

## 2018-08-11 NOTE — Addendum Note (Signed)
Addended by: Gelene Mink on: 08/11/2018 03:13 PM   Modules accepted: Orders

## 2018-08-11 NOTE — Telephone Encounter (Signed)
Done

## 2018-08-14 ENCOUNTER — Encounter: Payer: Self-pay | Admitting: Family Medicine

## 2018-08-14 ENCOUNTER — Ambulatory Visit (HOSPITAL_COMMUNITY)
Admission: RE | Admit: 2018-08-14 | Discharge: 2018-08-14 | Disposition: A | Discharge status: Home / Self Care | Payer: Medicare Other | Source: Ambulatory Visit | Attending: Family Medicine | Admitting: Family Medicine

## 2018-08-14 DIAGNOSIS — Z1231 Encounter for screening mammogram for malignant neoplasm of breast: Secondary | ICD-10-CM

## 2018-08-14 DIAGNOSIS — M85852 Other specified disorders of bone density and structure, left thigh: Secondary | ICD-10-CM | POA: Diagnosis not present

## 2018-08-14 DIAGNOSIS — Z780 Asymptomatic menopausal state: Secondary | ICD-10-CM | POA: Insufficient documentation

## 2018-08-18 ENCOUNTER — Encounter: Payer: Self-pay | Admitting: Family Medicine

## 2018-08-18 NOTE — Progress Notes (Signed)
Kimberly Freeman     MRN: [id number]      DOB: [birth year]   HPI Kimberly Freeman is here for follow up and re-evaluation of chronic medical conditions, medication management and review of any available recent lab and radiology data.  Preventive health is updated, specifically  Cancer screening and Immunization.   Questions or concerns regarding consultations or procedures which the PT has had in the interim are  addressed. The PT denies any adverse reactions to current medications since the last visit.  There are no new concerns.  There are no specific complaints  Denies polyuria, polydipsia, blurred vision , or hypoglycemic episodes.   ROS Denies recent fever or chills. Denies sinus pressure, nasal congestion, ear pain or sore throat. Denies chest congestion, productive cough or wheezing. Denies chest pains, palpitations and leg swelling Denies abdominal pain, nausea, vomiting,diarrhea or constipation.   Denies dysuria, frequency, hesitancy or incontinence. Denies uncontrolled  joint pain, swelling and limitation in mobility. Denies headaches, seizures, numbness, or tingling. Denies depression, anxiety or insomnia. Denies skin break down or rash.   PE  BP 130/60   Pulse 65   Resp 12   Ht 5\' 7"  (1.702 m)   Wt 186 lb (84.4 kg)   SpO2 97% Comment: room air  BMI 29.13 kg/m   Patient alert and oriented and in no cardiopulmonary distress.  HEENT: No facial asymmetry, EOMI,   oropharynx pink and moist.  Neck supple no JVD, no mass.  Chest: Clear to auscultation bilaterally.  CVS: S1, S2 no murmurs, no S3.Regular rate.  ABD: Soft non tender.   Ext: No edema  MS: Adequate though reduced  ROM spine, , hips and knees.Markedly reduced in right shoulder  Skin: Intact, no ulcerations or rash noted.  Psych: Good eye contact, normal affect. Memory intact not anxious or depressed appearing.  CNS: CN 2-12 intact, power,  normal throughout.no focal deficits noted.   Assessment &  Plan  Malignant hypertension Controlled, no change in medication DASH diet and commitment to daily physical activity for a minimum of 30 minutes discussed and encouraged, as a part of hypertension management. The importance of attaining a healthy weight is also discussed.  BP/Weight 08/05/2018 06/18/2018 06/04/2018 05/21/2018 05/15/2018 04/23/2018 04/09/2018  Systolic BP 130 134 149 101 140 116 158  Diastolic BP 60 60 53 57 72 64 75  Wt. (Lbs) 186 189 188.4 185 183 187 188  BMI 29.13 29.6 29.51 28.98 28.66 29.29 30.81       Hyperlipidemia LDL goal <100 Hyperlipidemia:Low fat diet discussed and encouraged.   Lipid Panel  Lab Results  Component Value Date   CHOL 138 08/04/2018   HDL 43 (L) 08/04/2018   LDLCALC 78 08/04/2018   TRIG 90 08/04/2018   CHOLHDL 3.2 08/04/2018     Needs to increase exercise  Diabetes mellitus, insulin dependent (IDDM), controlled (HCC) Kimberly Freeman is reminded of the importance of commitment to daily physical activity for 30 minutes or more, as able and the need to limit carbohydrate intake to 30 to 60 grams per meal to help with blood sugar control.   The need to take medication as prescribed, test blood sugar as directed, and to call between visits if there is a concern that blood sugar is uncontrolled is also discussed.   Kimberly Freeman is reminded of the importance of daily foot exam, annual eye examination, and good blood sugar, blood pressure and cholesterol control.  Diabetic Labs Latest Ref Rng & Units 08/04/2018 05/30/2018  04/02/2018 11/27/2017 11/26/2017  HbA1c <5.7 % of total Hgb 7.1(H) - 7.5(H) - 7.3(H)  Microalbumin mg/dL - - 0.7 1.6(X) -  Micro/Creat Ratio <30 mcg/mg creat - - 6 5.1 -  Chol <200 mg/dL 096 - - - 045  HDL >40 mg/dL 98(J) - - - 52  Calc LDL mg/dL (calc) 78 - - - 85  Triglycerides <150 mg/dL 90 - - - 191  Creatinine 0.60 - 0.93 mg/dL 4.78 2.95(A) 2.13 - 0.86(V)   BP/Weight 08/05/2018 06/18/2018 06/04/2018 05/21/2018 05/15/2018 04/23/2018  04/09/2018  Systolic BP 130 134 149 101 140 116 158  Diastolic BP 60 60 53 57 72 64 75  Wt. (Lbs) 186 189 188.4 185 183 187 188  BMI 29.13 29.6 29.51 28.98 28.66 29.29 30.81   Foot/eye exam completion dates Latest Ref Rng & Units 08/05/2018 07/25/2017  Eye Exam No Retinopathy - -  Foot exam Order - - -  Foot Form Completion - Done Done      Controlled, no change in medication   Stress at home Uncghnaged, spouse reportedly becoming increasingly paranoid

## 2018-08-18 NOTE — Assessment & Plan Note (Signed)
Hyperlipidemia:Low fat diet discussed and encouraged.   Lipid Panel  Lab Results  Component Value Date   CHOL 138 08/04/2018   HDL 43 (L) 08/04/2018   LDLCALC 78 08/04/2018   TRIG 90 08/04/2018   CHOLHDL 3.2 08/04/2018     Needs to increase exercise

## 2018-08-18 NOTE — Assessment & Plan Note (Signed)
[name] is reminded of the importance of commitment to daily physical activity for 30 minutes or more, as able and the need to limit carbohydrate intake to 30 to 60 grams per meal to help with blood sugar control.   The need to take medication as prescribed, test blood sugar as directed, and to call between visits if there is a concern that blood sugar is uncontrolled is also discussed.   [name] is reminded of the importance of daily foot exam, annual eye examination, and good blood sugar, blood pressure and cholesterol control.  Diabetic Labs Latest Ref Rng & Units 08/04/2018 05/30/2018 04/02/2018 11/27/2017 11/26/2017  HbA1c <5.7 % of total Hgb 7.1(H) - 7.5(H) - 7.3(H)  Microalbumin mg/dL - - 0.7 1.6(X) -  Micro/Creat Ratio <30 mcg/mg creat - - 6 5.1 -  Chol <200 mg/dL 096 - - - 045  HDL >40 mg/dL 98(J) - - - 52  Calc LDL mg/dL (calc) 78 - - - 85  Triglycerides <150 mg/dL 90 - - - 191  Creatinine 0.60 - 0.93 mg/dL 4.78 2.95(A) 2.13 - 0.86(V)   BP/Weight 08/05/2018 06/18/2018 06/04/2018 05/21/2018 05/15/2018 04/23/2018 04/09/2018  Systolic BP 130 134 149 101 140 116 158  Diastolic BP 60 60 53 57 72 64 75  Wt. (Lbs) 186 189 188.4 185 183 187 188  BMI 29.13 29.6 29.51 28.98 28.66 29.29 30.81   Foot/eye exam completion dates Latest Ref Rng & Units 08/05/2018 07/25/2017  Eye Exam No Retinopathy - -  Foot exam Order - - -  Foot Form Completion - Done Done      Controlled, no change in medication

## 2018-08-18 NOTE — Assessment & Plan Note (Signed)
Controlled, no change in medication DASH diet and commitment to daily physical activity for a minimum of 30 minutes discussed and encouraged, as a part of hypertension management. The importance of attaining a healthy weight is also discussed.  BP/Weight 08/05/2018 06/18/2018 06/04/2018 05/21/2018 05/15/2018 04/23/2018 04/09/2018  Systolic BP 130 134 149 101 140 116 158  Diastolic BP 60 60 53 57 72 64 75  Wt. (Lbs) 186 189 188.4 185 183 187 188  BMI 29.13 29.6 29.51 28.98 28.66 29.29 30.81

## 2018-08-18 NOTE — Assessment & Plan Note (Signed)
Uncghnaged, spouse reportedly becoming increasingly paranoid

## 2018-08-19 DIAGNOSIS — E1142 Type 2 diabetes mellitus with diabetic polyneuropathy: Secondary | ICD-10-CM | POA: Diagnosis not present

## 2018-08-19 DIAGNOSIS — B351 Tinea unguium: Secondary | ICD-10-CM | POA: Diagnosis not present

## 2018-08-27 ENCOUNTER — Other Ambulatory Visit: Payer: Self-pay | Admitting: Family Medicine

## 2018-09-22 ENCOUNTER — Ambulatory Visit (INDEPENDENT_AMBULATORY_CARE_PROVIDER_SITE_OTHER): Payer: Medicare Other

## 2018-09-22 VITALS — BP 170/80 | HR 73 | Resp 10 | Ht 67.0 in | Wt 186.0 lb

## 2018-09-22 DIAGNOSIS — Z0000 Encounter for general adult medical examination without abnormal findings: Secondary | ICD-10-CM

## 2018-09-22 NOTE — Patient Instructions (Signed)
Kimberly Freeman , Thank you for taking time to come for your Medicare Wellness Visit. I appreciate your ongoing commitment to your health goals. Please review the following plan we discussed and let me know if I can assist you in the future.   Screening recommendations/referrals: Colonoscopy: up to date  Mammogram: up to date  Bone Density: up to date  Recommended yearly ophthalmology/optometry visit for glaucoma screening and checkup Recommended yearly dental visit for hygiene and checkup  Vaccinations: Influenza vaccine: up to date  Pneumococcal vaccine: up to date  Tdap vaccine: up to date  Shingles vaccine: up to date     Advanced directives: information provided   Conditions/risks identified: advanced age, diabetes, hypertension, chronic pain   Next appointment: Wellness visit in one year    Preventive Care 8665 Years and Older, Female Preventive care refers to lifestyle choices and visits with your health care provider that can promote health and wellness. What does preventive care include?  A yearly physical exam. This is also called an annual well check.  Dental exams once or twice a year.  Routine eye exams. Ask your health care provider how often you should have your eyes checked.  Personal lifestyle choices, including:  Daily care of your teeth and gums.  Regular physical activity.  Eating a healthy diet.  Avoiding tobacco and drug use.  Limiting alcohol use.  Practicing safe sex.  Taking low-dose aspirin every day.  Taking vitamin and mineral supplements as recommended by your health care provider. What happens during an annual well check? The services and screenings done by your health care provider during your annual well check will depend on your age, overall health, lifestyle risk factors, and family history of disease. Counseling  Your health care provider may ask you questions about your:  Alcohol use.  Tobacco use.  Drug use.  Emotional  well-being.  Home and relationship well-being.  Sexual activity.  Eating habits.  History of falls.  Memory and ability to understand (cognition).  Work and work Astronomerenvironment.  Reproductive health. Screening  You may have the following tests or measurements:  Height, weight, and BMI.  Blood pressure.  Lipid and cholesterol levels. These may be checked every 5 years, or more frequently if you are over 79 years old.  Skin check.  Lung cancer screening. You may have this screening every year starting at age 79 if you have a 30-pack-year history of smoking and currently smoke or have quit within the past 15 years.  Fecal occult blood test (FOBT) of the stool. You may have this test every year starting at age 79.  Flexible sigmoidoscopy or colonoscopy. You may have a sigmoidoscopy every 5 years or a colonoscopy every 10 years starting at age 79.  Hepatitis C blood test.  Hepatitis B blood test.  Sexually transmitted disease (STD) testing.  Diabetes screening. This is done by checking your blood sugar (glucose) after you have not eaten for a while (fasting). You may have this done every 1-3 years.  Bone density scan. This is done to screen for osteoporosis. You may have this done starting at age 79.  Mammogram. This may be done every 1-2 years. Talk to your health care provider about how often you should have regular mammograms. Talk with your health care provider about your test results, treatment options, and if necessary, the need for more tests. Vaccines  Your health care provider may recommend certain vaccines, such as:  Influenza vaccine. This is recommended every year.  Tetanus, diphtheria, and acellular pertussis (Tdap, Td) vaccine. You may need a Td booster every 10 years.  Zoster vaccine. You may need this after age 22.  Pneumococcal 13-valent conjugate (PCV13) vaccine. One dose is recommended after age 4.  Pneumococcal polysaccharide (PPSV23) vaccine. One  dose is recommended after age 45. Talk to your health care provider about which screenings and vaccines you need and how often you need them. This information is not intended to replace advice given to you by your health care provider. Make sure you discuss any questions you have with your health care provider. Document Released: 11/04/2015 Document Revised: 06/27/2016 Document Reviewed: 08/09/2015 Elsevier Interactive Patient Education  2017 [place] Prevention in the Home Falls can cause injuries. They can happen to people of all ages. There are many things you can do to make your home safe and to help prevent falls. What can I do on the outside of my home?  Regularly fix the edges of walkways and driveways and fix any cracks.  Remove anything that might make you trip as you walk through a door, such as a raised step or threshold.  Trim any bushes or trees on the path to your home.  Use bright outdoor lighting.  Clear any walking paths of anything that might make someone trip, such as rocks or tools.  Regularly check to see if handrails are loose or broken. Make sure that both sides of any steps have handrails.  Any raised decks and porches should have guardrails on the edges.  Have any leaves, snow, or ice cleared regularly.  Use sand or salt on walking paths during winter.  Clean up any spills in your garage right away. This includes oil or grease spills. What can I do in the bathroom?  Use night lights.  Install grab bars by the toilet and in the tub and shower. Do not use towel bars as grab bars.  Use non-skid mats or decals in the tub or shower.  If you need to sit down in the shower, use a plastic, non-slip stool.  Keep the floor dry. Clean up any water that spills on the floor as soon as it happens.  Remove soap buildup in the tub or shower regularly.  Attach bath mats securely with double-sided non-slip rug tape.  Do not have throw rugs and other  things on the floor that can make you trip. What can I do in the bedroom?  Use night lights.  Make sure that you have a light by your bed that is easy to reach.  Do not use any sheets or blankets that are too big for your bed. They should not hang down onto the floor.  Have a firm chair that has side arms. You can use this for support while you get dressed.  Do not have throw rugs and other things on the floor that can make you trip. What can I do in the kitchen?  Clean up any spills right away.  Avoid walking on wet floors.  Keep items that you use a lot in easy-to-reach places.  If you need to reach something above you, use a strong step stool that has a grab bar.  Keep electrical cords out of the way.  Do not use floor polish or wax that makes floors slippery. If you must use wax, use non-skid floor wax.  Do not have throw rugs and other things on the floor that can make you trip. What can I do  with my stairs?  Do not leave any items on the stairs.  Make sure that there are handrails on both sides of the stairs and use them. Fix handrails that are broken or loose. Make sure that handrails are as long as the stairways.  Check any carpeting to make sure that it is firmly attached to the stairs. Fix any carpet that is loose or worn.  Avoid having throw rugs at the top or bottom of the stairs. If you do have throw rugs, attach them to the floor with carpet tape.  Make sure that you have a light switch at the top of the stairs and the bottom of the stairs. If you do not have them, ask someone to add them for you. What else can I do to help prevent falls?  Wear shoes that:  Do not have high heels.  Have rubber bottoms.  Are comfortable and fit you well.  Are closed at the toe. Do not wear sandals.  If you use a stepladder:  Make sure that it is fully opened. Do not climb a closed stepladder.  Make sure that both sides of the stepladder are locked into place.  Ask  someone to hold it for you, if possible.  Clearly mark and make sure that you can see:  Any grab bars or handrails.  First and last steps.  Where the edge of each step is.  Use tools that help you move around (mobility aids) if they are needed. These include:  Canes.  Walkers.  Scooters.  Crutches.  Turn on the lights when you go into a dark area. Replace any light bulbs as soon as they burn out.  Set up your furniture so you have a clear path. Avoid moving your furniture around.  If any of your floors are uneven, fix them.  If there are any pets around you, be aware of where they are.  Review your medicines with your doctor. Some medicines can make you feel dizzy. This can increase your chance of falling. Ask your doctor what other things that you can do to help prevent falls. This information is not intended to replace advice given to you by your health care provider. Make sure you discuss any questions you have with your health care provider. Document Released: 08/04/2009 Document Revised: 03/15/2016 Document Reviewed: 11/12/2014 Elsevier Interactive Patient Education  2017 [employer].

## 2018-09-22 NOTE — Progress Notes (Signed)
Subjective:   Kimberly Freeman is a 77 y.o. female who presents for Medicare Annual (Subsequent) preventive examination.  Review of Systems:   Cardiac Risk Factors include: hypertension;advanced age (>2455men, 30>65 women);diabetes mellitus;obesity (BMI >30kg/m2);dyslipidemia     Objective:     Vitals: BP (!) 170/80   Pulse 73   Resp 10   Ht 5\' 7"  (1.702 m)   Wt 186 lb (84.4 kg)   SpO2 96%   BMI 29.13 kg/m   Body mass index is 29.13 kg/m.  Advanced Directives 09/22/2018 06/04/2018 11/28/2017 09/18/2017 07/23/2017 07/18/2017 04/22/2017  Does Patient Have a Medical Advance Directive? No No No No No No No  Type of Advance Directive - - - - - - -  Does patient want to make changes to medical advance directive? - - - - - - -  Copy of Healthcare Power of Attorney in Chart? - - - - - - -  Would patient like information on creating a medical advance directive? No - Patient declined No - Patient declined No - Patient declined No - Patient declined No - Patient declined - No - Patient declined    Tobacco Social History   Tobacco Use  Smoking Status Never Smoker  Smokeless Tobacco Never Used     Counseling given: Not Answered   Clinical Intake:  Pre-visit preparation completed: Yes  Pain : 0-10 Pain Score: 2  Pain Type: Chronic pain Pain Location: Knee Pain Orientation: Left Pain Descriptors / Indicators: Aching Pain Onset: More than a month ago Pain Frequency: Intermittent Pain Relieving Factors: tylenol   Pain Relieving Factors: tylenol   BMI - recorded: 29.1 Nutritional Status: BMI 25 -29 Overweight Nutritional Risks: None Diabetes: Yes CBG done?: No Did pt. bring in CBG monitor from home?: No  How often do you need to have someone help you when you read instructions, pamphlets, or other written materials from your doctor or pharmacy?: 2 - Rarely What is the last grade level you completed in school?: 12 Grade   Interpreter Needed?: No  Information entered by :: [place] PoliAnita  Nelson LPN   Past Medical History:  Diagnosis Date  . Arthritis   . Diabetes mellitus, type 2 (HCC)   . Essential hypertension   . GERD (gastroesophageal reflux disease)   . Hyperlipidemia    Past Surgical History:  Procedure Laterality Date  . ABDOMINAL HYSTERECTOMY    . BIOPSY  07/18/2017   Procedure: BIOPSY;  Surgeon: West BaliFields, Sandi L, MD;  [facility];  Service: Endoscopy;;  gastric  . CHOLECYSTECTOMY  2004  . COLONOSCOPY N/A 07/18/2017   Dr. Darrick Pennafields: Internal and external hemorrhoids.  No future screening/surveillance colonoscopies due to age.  . ESOPHAGOGASTRODUODENOSCOPY N/A 07/18/2017   Dr. Darrick Pennafields: Small hiatal hernia, patchy mild inflammation characterized by congestion, erosions, erythema in the cardia, gastric body and antrum.  Diffuse moderate inflammation in the duodenal bulb and second portion duodenum.  Biopsies benign.  No H. pylori.   Family History  Problem Relation Age of Onset  . Leukemia Mother   . Cancer Mother   . Prostate cancer Father   . Lung cancer Sister [id number]  . Diabetes Sister   . Colon cancer Neg Hx    Social History   Socioeconomic History  . Marital status: Married    Spouse name: [name]   . Number of children: 0  . Years of education: 12+  . Highest education level: 12th grade  Occupational History  . Occupation: retired  Social Needs  . Financial resource strain: Not hard at all  . Food insecurity:    Worry: Never true    Inability: Never true  . Transportation needs:    Medical: No    Non-medical: No  Tobacco Use  . Smoking status: Never Smoker  . Smokeless tobacco: Never Used  Substance and Sexual Activity  . Alcohol use: No    Alcohol/week: 0.0 standard drinks  . Drug use: No  . Sexual activity: Not Currently    Partners: Male  Lifestyle  . Physical activity:    Days per week: 4 days    Minutes per session: 40 min  . Stress: Not at all  Relationships  . Social connections:    Talks on phone: More than three  times a week    Gets together: Once a week    Attends religious service: 1 to 4 times per year    Active member of club or organization: Yes    Attends meetings of clubs or organizations: More than 4 times per year    Relationship status: Married  Other Topics Concern  . Not on file  Social History Narrative   Lives alone with husband     Outpatient Encounter Medications as of 09/22/2018  Medication Sig  . acetaminophen (TYLENOL) 500 MG tablet Take 500 mg by mouth daily as needed for mild pain or moderate pain. For pain   . aspirin 325 MG tablet Take 325 mg by mouth every morning.  Marland Kitchen atorvastatin (LIPITOR) 40 MG tablet TAKE 1 TABLET BY MOUTH AT BEDTIME  . BD PEN NEEDLE MICRO U/F 32G X 6 MM MISC USE TO INJECT INSULIN DAILY  . glipiZIDE (GLUCOTROL XL) 5 MG 24 hr tablet TAKE 1 TABLET BY MOUTH DAILY  . glucose blood (ONE TOUCH ULTRA TEST) test strip USE AS INSTRUCTED FOR TWICE DAILY TESTING  . Insulin Glargine (BASAGLAR KWIKPEN) 100 UNIT/ML SOPN INJECT 50 UNITS SUBCUTANEOUSLY AT BEDTIME (DISCONTINUE LEVEMIR)  . irbesartan (AVAPRO) 300 MG tablet TAKE 1 TABLET BY MOUTH DAILY  . Lancets (ONETOUCH ULTRASOFT) lancets USE AS DIRECTED TWICE DAILY  . metFORMIN (GLUCOPHAGE-XR) 500 MG 24 hr tablet TAKE 2 TABLETS BY MOUTH TWICE DAILY  . metoprolol tartrate (LOPRESSOR) 50 MG tablet One and a half tablets twice daily  . Multiple Vitamin (MULTIVITAMIN WITH MINERALS) TABS tablet Take 1 tablet by mouth daily.  . pantoprazole (PROTONIX) 40 MG tablet 1 po 30 min before first meal  . spironolactone (ALDACTONE) 25 MG tablet One and a half  Tablets once daily   No facility-administered encounter medications on file as of 09/22/2018.     Activities of Daily Living In your present state of health, do you have any difficulty performing the following activities: 09/22/2018  Hearing? N  Vision? Y  Difficulty concentrating or making decisions? N  Walking or climbing stairs? N  Dressing or bathing? N  Doing  errands, shopping? N  Preparing Food and eating ? N  Using the Toilet? N  In the past six months, have you accidently leaked urine? N  Do you have problems with loss of bowel control? N  Managing your Medications? N  Managing your Finances? N  Housekeeping or managing your Housekeeping? N  Some recent data might be hidden    Patient Care Team: [name], MD as PCP - General Laqueta Linden, MD as Attending Physician (Cardiology) Ferman Hamming, DPM as Consulting Physician (Podiatry) [name], MD as Consulting Physician (Gastroenterology)  Assessment:   This is a routine wellness examination for Kimberly Freeman.  Exercise Activities and Dietary recommendations Current Exercise Habits: Home exercise routine, Type of exercise: walking, Time (Minutes): 40, Frequency (Times/Week): 5, Weekly Exercise (Minutes/Week): 200, Intensity: Moderate, Exercise limited by: orthopedic condition(s);cardiac condition(s)  Goals    . DIET - REDUCE SUGAR INTAKE    . Exercise 5x per week (60 min per time)     Starting 10/04/2016 patient would like to start back on her exercise bicycle 5 times a week for 60 minutes at a time.       Fall Risk Fall Risk  09/22/2018 08/05/2018 05/15/2018 04/03/2018 02/05/2018  Falls in the past year? 0 No No No No  Risk for fall due to : - - - - -   Is the patient's home free of loose throw rugs in walkways, pet beds, electrical cords, etc?   no      Grab bars in the bathroom? no      Handrails on the stairs?   yes      Adequate lighting?   yes  Timed Get Up and Go performed: Patient able to perform in 7 seconds without assistance   Depression Screen PHQ 2/9 Scores 09/22/2018 08/05/2018 05/15/2018 04/03/2018  PHQ - 2 Score 0 0 0 0  PHQ- 9 Score - - - -     Cognitive Function     6CIT Screen 09/22/2018 09/18/2017 10/04/2016  What Year? 0 points 0 points 0 points  What month? 0 points 0 points 0 points  What time? 0 points 0 points 0 points  Count  back from 20 0 points 0 points 0 points  Months in reverse 0 points 0 points 0 points  Repeat phrase 2 points 0 points 0 points  Total Score 2 0 0    Immunization History  Administered Date(s) Administered  . Influenza Split 08/01/2011, 07/31/2012  . Influenza Whole 07/29/2007, 07/20/2008, 07/21/2009, 07/19/2010  . Influenza, High Dose Seasonal PF 07/23/2018  . Influenza,inj,Quad PF,6+ Mos 08/07/2013, 06/15/2014, 07/19/2015, 06/28/2016, 07/25/2017  . Pneumococcal Conjugate-13 10/12/2014  . Pneumococcal Polysaccharide-23 03/16/2004, 08/29/2012  . Td 03/16/2004  . Tdap 03/31/2012  . Zoster 01/02/2007    Qualifies for Shingles Vaccine?up to date   Screening Tests Health Maintenance  Topic Date Due  . OPHTHALMOLOGY EXAM  06/25/2018  . HEMOGLOBIN A1C  02/03/2019  . FOOT EXAM  08/19/2019  . TETANUS/TDAP  03/31/2022  . INFLUENZA VACCINE  Completed  . DEXA SCAN  Completed  . PNA vac Low Risk Adult  Completed    Cancer Screenings: Lung: Low Dose CT Chest recommended if Age 72-80 years, 30 pack-year currently smoking OR have quit w/in 15years. Patient does not qualify. Breast:  Up to date on Mammogram? Yes   Up to date of Bone Density/Dexa? Yes Colorectal: up to date   Additional Screenings:  Hepatitis C Screening:N/A       Plan:   Continue to increase exercise daily and decrease sugar intake   I have personally reviewed and noted the following in the patient's chart:   . Medical and social history . Use of alcohol, tobacco or illicit drugs  . Current medications and supplements . Functional ability and status . Nutritional status . Physical activity . Advanced directives . List of other physicians . Hospitalizations, surgeries, and ER visits in previous 12 months . Vitals . Screenings to include cognitive, depression, and falls . Referrals and appointments  In addition, I have reviewed and discussed with patient  certain preventive protocols, quality metrics, and  best practice recommendations. A written personalized care plan for preventive services as well as general preventive health recommendations were provided to patient.     Fabian November, LPN  [id number]

## 2018-09-25 ENCOUNTER — Encounter: Payer: Self-pay | Admitting: Orthopaedic Surgery

## 2018-09-25 ENCOUNTER — Ambulatory Visit (INDEPENDENT_AMBULATORY_CARE_PROVIDER_SITE_OTHER): Payer: Medicare Other | Admitting: Orthopaedic Surgery

## 2018-09-25 DIAGNOSIS — M25562 Pain in left knee: Secondary | ICD-10-CM

## 2018-09-25 DIAGNOSIS — G8929 Other chronic pain: Secondary | ICD-10-CM | POA: Diagnosis not present

## 2018-09-25 NOTE — Progress Notes (Signed)
CC:  I have pain of my left knee. I would like an injection.  The patient has chronic pain of the left knee.  There is no recent trauma.  There is no redness.  Injections in the past have helped.  The knee has no redness, has an effusion and crepitus present.  ROM of the left knee is 0-105.  Impression:  Chronic knee pain left  Return: prn  PROCEDURE NOTE:  The patient requests injections of the left knee, verbal consent was obtained.  The left knee was prepped appropriately after time out was performed.   Sterile technique was observed and injection of 1 cc of Depo-Medrol 40 mg with several cc's of plain xylocaine. Anesthesia was provided by ethyl chloride and a 20-gauge needle was used to inject the knee area. The injection was tolerated well.  A band aid dressing was applied.  The patient was advised to apply ice later today and tomorrow to the injection sight as needed.  Electronically Signed Darreld McleanWayne Richy Spradley, MD 12/5/20199:06 AM

## 2018-10-30 ENCOUNTER — Encounter: Payer: Self-pay | Admitting: *Deleted

## 2018-11-11 DIAGNOSIS — B351 Tinea unguium: Secondary | ICD-10-CM | POA: Diagnosis not present

## 2018-11-11 DIAGNOSIS — E1142 Type 2 diabetes mellitus with diabetic polyneuropathy: Secondary | ICD-10-CM | POA: Diagnosis not present

## 2018-11-21 DIAGNOSIS — E119 Type 2 diabetes mellitus without complications: Secondary | ICD-10-CM | POA: Diagnosis not present

## 2018-11-21 DIAGNOSIS — I10 Essential (primary) hypertension: Secondary | ICD-10-CM | POA: Diagnosis not present

## 2018-11-21 DIAGNOSIS — Z794 Long term (current) use of insulin: Secondary | ICD-10-CM | POA: Diagnosis not present

## 2018-11-22 LAB — BASIC METABOLIC PANEL WITH GFR
BUN / CREAT RATIO: 17 (calc) (ref 6–22)
BUN: 19 mg/dL (ref 7–25)
CALCIUM: 10 mg/dL (ref 8.6–10.4)
CHLORIDE: 105 mmol/L (ref 98–110)
CO2: 29 mmol/L (ref 20–32)
Creat: 1.09 mg/dL — ABNORMAL HIGH (ref 0.60–0.93)
GFR, EST AFRICAN AMERICAN: 56 mL/min/{1.73_m2} — AB (ref >=60)
GFR, EST NON AFRICAN AMERICAN: 48 mL/min/{1.73_m2} — AB (ref >=60)
Glucose, Bld: 128 mg/dL (ref 65–139)
POTASSIUM: 4.1 mmol/L (ref 3.5–5.3)
SODIUM: 142 mmol/L (ref 135–146)

## 2018-11-22 LAB — HEMOGLOBIN A1C
Hgb A1c MFr Bld: 7.1 % of total Hgb — ABNORMAL HIGH (ref <5.7)
MEAN PLASMA GLUCOSE: 157 (calc)
eAG (mmol/L): 8.7 (calc)

## 2018-11-26 ENCOUNTER — Telehealth: Payer: Self-pay

## 2018-11-26 ENCOUNTER — Other Ambulatory Visit: Payer: Self-pay

## 2018-11-26 DIAGNOSIS — I10 Essential (primary) hypertension: Secondary | ICD-10-CM

## 2018-11-26 DIAGNOSIS — Z794 Long term (current) use of insulin: Principal | ICD-10-CM

## 2018-11-26 DIAGNOSIS — E119 Type 2 diabetes mellitus without complications: Principal | ICD-10-CM

## 2018-11-26 DIAGNOSIS — E785 Hyperlipidemia, unspecified: Secondary | ICD-10-CM

## 2018-11-26 DIAGNOSIS — IMO0001 Reserved for inherently not codable concepts without codable children: Secondary | ICD-10-CM

## 2018-11-26 MED ORDER — METFORMIN HCL ER 500 MG PO TB24: 500.0000 mg | ORAL_TABLET | Freq: Two times a day (BID) | ORAL | 1 refills | Status: DC

## 2018-11-26 NOTE — Telephone Encounter (Signed)
-----  Message from Fayrene Helper, MD sent at 11/24/2018  4:43 AM EST ----- pls advise blood sugar controlled well, however, based on kidney function being slightly decreased , I recommend reducing metformin from 500 mg two twice daily to 500 mg ONE twice daily. Needs lab order for HBA1C, cmp and eGFr and lipids fasting , BUT DRINK WTER day of test , to be drawn 3 to5 days prior to May visit, thanks

## 2018-12-01 ENCOUNTER — Inpatient Hospital Stay (HOSPITAL_COMMUNITY): Payer: Medicare Other | Attending: Hematology

## 2018-12-01 DIAGNOSIS — N189 Chronic kidney disease, unspecified: Secondary | ICD-10-CM | POA: Diagnosis not present

## 2018-12-01 DIAGNOSIS — D649 Anemia, unspecified: Secondary | ICD-10-CM | POA: Diagnosis not present

## 2018-12-01 DIAGNOSIS — D500 Iron deficiency anemia secondary to blood loss (chronic): Secondary | ICD-10-CM

## 2018-12-01 LAB — CBC WITH DIFFERENTIAL/PLATELET
Abs Immature Granulocytes: 0 10*3/uL (ref 0.00–0.07)
Basophils Absolute: 0 10*3/uL (ref 0.0–0.1)
Basophils Relative: 0 %
Eosinophils Absolute: 0.2 10*3/uL (ref 0.0–0.5)
Eosinophils Relative: 5 %
HCT: 36 % (ref 36.0–46.0)
Hemoglobin: 11.2 g/dL — ABNORMAL LOW (ref 12.0–15.0)
IMMATURE GRANULOCYTES: 0 %
LYMPHS ABS: 1.8 10*3/uL (ref 0.7–4.0)
LYMPHS PCT: 35 %
MCH: 29.2 pg (ref 26.0–34.0)
MCHC: 31.1 g/dL (ref 30.0–36.0)
MCV: 93.8 fL (ref 80.0–100.0)
Monocytes Absolute: 0.5 10*3/uL (ref 0.1–1.0)
Monocytes Relative: 10 %
Neutro Abs: 2.7 10*3/uL (ref 1.7–7.7)
Neutrophils Relative %: 50 %
PLATELETS: 205 10*3/uL (ref 150–400)
RBC: 3.84 MIL/uL — ABNORMAL LOW (ref 3.87–5.11)
RDW: 12.8 % (ref 11.5–15.5)
WBC: 5.2 10*3/uL (ref 4.0–10.5)
nRBC: 0 % (ref 0.0–0.2)

## 2018-12-01 LAB — VITAMIN B12: Vitamin B-12: 332 pg/mL (ref 180–914)

## 2018-12-01 LAB — FERRITIN: Ferritin: 58 ng/mL (ref 11–307)

## 2018-12-01 LAB — COMPREHENSIVE METABOLIC PANEL
ALT: 21 U/L (ref 0–44)
AST: 20 U/L (ref 15–41)
Albumin: 3.8 g/dL (ref 3.5–5.0)
Alkaline Phosphatase: 68 U/L (ref 38–126)
Anion gap: 8 (ref 5–15)
BUN: 13 mg/dL (ref 8–23)
CHLORIDE: 106 mmol/L (ref 98–111)
CO2: 25 mmol/L (ref 22–32)
Calcium: 9.4 mg/dL (ref 8.9–10.3)
Creatinine, Ser: 0.96 mg/dL (ref 0.44–1.00)
GFR calc Af Amer: >60 mL/min (ref >60)
GFR calc non Af Amer: 56 mL/min — ABNORMAL LOW (ref >60)
Glucose, Bld: 242 mg/dL — ABNORMAL HIGH (ref 70–99)
Potassium: 3.9 mmol/L (ref 3.5–5.1)
Sodium: 139 mmol/L (ref 135–145)
Total Bilirubin: 0.7 mg/dL (ref 0.3–1.2)
Total Protein: 6.9 g/dL (ref 6.5–8.1)

## 2018-12-01 LAB — IRON AND TIBC
Iron: 65 ug/dL (ref 28–170)
SATURATION RATIOS: 20 % (ref 10.4–31.8)
TIBC: 317 ug/dL (ref 250–450)
UIBC: 252 ug/dL

## 2018-12-01 LAB — FOLATE: Folate: 88.4 ng/mL (ref >5.9)

## 2018-12-08 ENCOUNTER — Encounter (HOSPITAL_COMMUNITY): Payer: Self-pay | Admitting: Hematology

## 2018-12-08 ENCOUNTER — Other Ambulatory Visit: Payer: Self-pay

## 2018-12-08 ENCOUNTER — Inpatient Hospital Stay (HOSPITAL_BASED_OUTPATIENT_CLINIC_OR_DEPARTMENT_OTHER): Payer: Medicare Other | Admitting: Hematology

## 2018-12-08 VITALS — BP 154/67 | HR 75 | Temp 98.3°F | Resp 16 | Wt 188.0 lb

## 2018-12-08 DIAGNOSIS — D649 Anemia, unspecified: Secondary | ICD-10-CM | POA: Diagnosis not present

## 2018-12-08 DIAGNOSIS — N189 Chronic kidney disease, unspecified: Secondary | ICD-10-CM

## 2018-12-08 DIAGNOSIS — D500 Iron deficiency anemia secondary to blood loss (chronic): Secondary | ICD-10-CM

## 2018-12-08 NOTE — Assessment & Plan Note (Signed)
1.  Normocytic anemia: -Previously thought to be from chronic blood loss.  There is also component of CKD.  - EGD on 07/18/2017 shows gastritis, duodenitis with no H. pylori. -Colonoscopy on 07/18/2017 shows internal and external hemorrhoids.  -Denies any bleeding per rectum or melena.  No B symptoms or recent hospitalizations.  - We reviewed her blood work.  Creatinine is slightly elevated on and off.  Hemoglobin was 11.2 with a normal MCV.  Ferritin was 58, down from 117, 6 months ago.  B12 was borderline at 332.  Folic acid was normal. -I have suggested her to take B12 tablet daily.  I will reassess her in 6 months with repeat blood work.

## 2018-12-08 NOTE — Progress Notes (Signed)
[facility] [address] [place], [place] [address]   CLINIC:  Medical Oncology/Hematology  PCP:  Kerri PerchesSimpson, Margaret E, MD [address] [place] [address] [phone]   REASON FOR VISIT: Follow-up for Anemia  CURRENT THERAPY: Observation   INTERVAL HISTORY:  Kimberly Freeman 78 y.o. female returns for routine follow-up for anemia. She has been doing well since her last visit. She has no complaints at this time. Denies any nausea, vomiting, or diarrhea. Denies any new pains. Had not noticed any recent bleeding such as epistaxis, hematuria or hematochezia. Denies recent chest pain on exertion, shortness of breath on minimal exertion, pre-syncopal episodes, or palpitations. Denies any numbness or tingling in hands or feet. Denies any recent fevers, infections, or recent hospitalizations. Patient reports appetite at 100% and energy level at 100%. She is eating well and maintaining her weight at this time.    REVIEW OF SYSTEMS:  Review of Systems  All other systems reviewed and are negative.    PAST MEDICAL/SURGICAL HISTORY:  Past Medical History:  Diagnosis Date  . Arthritis   . Diabetes mellitus, type 2 (HCC)   . Essential hypertension   . GERD (gastroesophageal reflux disease)   . Hyperlipidemia    Past Surgical History:  Procedure Laterality Date  . ABDOMINAL HYSTERECTOMY    . BIOPSY  07/18/2017   Procedure: BIOPSY;  Surgeon: West BaliFields, Sandi L, MD;  [facility];  Service: Endoscopy;;  gastric  . CHOLECYSTECTOMY  2004  . COLONOSCOPY N/A 07/18/2017   Dr. Darrick Pennafields: Internal and external hemorrhoids.  No future screening/surveillance colonoscopies due to age.  . ESOPHAGOGASTRODUODENOSCOPY N/A 07/18/2017   Dr. Darrick Pennafields: Small hiatal hernia, patchy mild inflammation characterized by congestion, erosions, erythema in the cardia, gastric body and antrum.  Diffuse moderate inflammation in the duodenal bulb and second portion duodenum.  Biopsies  benign.  No H. pylori.     SOCIAL HISTORY:  Social History   Socioeconomic History  . Marital status: Married    Spouse name: [name]   . Number of children: 0  . Years of education: 12+  . Highest education level: 12th grade  Occupational History  . Occupation: retired   Engineer, productionocial Needs  . Financial resource strain: Not hard at all  . Food insecurity:    Worry: Never true    Inability: Never true  . Transportation needs:    Medical: No    Non-medical: No  Tobacco Use  . Smoking status: Never Smoker  . Smokeless tobacco: Never Used  Substance and Sexual Activity  . Alcohol use: No    Alcohol/week: 0.0 standard drinks  . Drug use: No  . Sexual activity: Not Currently    Partners: Male  Lifestyle  . Physical activity:    Days per week: 4 days    Minutes per session: 40 min  . Stress: Not at all  Relationships  . Social connections:    Talks on phone: More than three times a week    Gets together: Once a week    Attends religious service: 1 to 4 times per year    Active member of club or organization: Yes    Attends meetings of clubs or organizations: More than 4 times per year    Relationship status: Married  . Intimate partner violence:    Fear of current or ex partner: No    Emotionally abused: No    Physically abused: No    Forced sexual activity: No  Other Topics Concern  . Not on file  Social History Narrative   Lives alone with husband     FAMILY HISTORY:  Family History  Problem Relation Age of Onset  . Leukemia Mother   . Cancer Mother   . Prostate cancer Father   . Lung cancer Sister 32  . Diabetes Sister   . Colon cancer Neg Hx     CURRENT MEDICATIONS:  Outpatient Encounter Medications as of 12/08/2018  Medication Sig  . acetaminophen (TYLENOL) 500 MG tablet Take 500 mg by mouth daily as needed for mild pain or moderate pain. For pain   . aspirin 325 MG tablet Take 325 mg by mouth every morning.  Marland Kitchen atorvastatin (LIPITOR) 40 MG tablet TAKE  1 TABLET BY MOUTH AT BEDTIME  . BD PEN NEEDLE MICRO U/F 32G X 6 MM MISC USE TO INJECT INSULIN DAILY  . calcium-vitamin D (OSCAL WITH D) 500-200 MG-UNIT tablet Take 1 tablet by mouth.  Marland Kitchen glipiZIDE (GLUCOTROL XL) 5 MG 24 hr tablet TAKE 1 TABLET BY MOUTH DAILY  . glucose blood (ONE TOUCH ULTRA TEST) test strip USE AS INSTRUCTED FOR TWICE DAILY TESTING  . Insulin Glargine (BASAGLAR KWIKPEN) 100 UNIT/ML SOPN INJECT 50 UNITS SUBCUTANEOUSLY AT BEDTIME (DISCONTINUE LEVEMIR)  . irbesartan (AVAPRO) 300 MG tablet TAKE 1 TABLET BY MOUTH DAILY  . Lancets (ONETOUCH ULTRASOFT) lancets USE AS DIRECTED TWICE DAILY  . metFORMIN (GLUCOPHAGE-XR) 500 MG 24 hr tablet Take 1 tablet (500 mg total) by mouth 2 (two) times daily.  . metoprolol tartrate (LOPRESSOR) 50 MG tablet One and a half tablets twice daily  . Multiple Vitamin (MULTIVITAMIN WITH MINERALS) TABS tablet Take 1 tablet by mouth daily.  . pantoprazole (PROTONIX) 40 MG tablet 1 po 30 min before first meal  . spironolactone (ALDACTONE) 25 MG tablet One and a half  Tablets once daily   No facility-administered encounter medications on file as of 12/08/2018.     ALLERGIES:  Allergies  Allergen Reactions  . Poison Oak Extract [Poison Oak Extract] Hives  . Sulfonamide Derivatives Itching    Burning sensation all over body     PHYSICAL EXAM:  ECOG Performance status: 1  Vitals:   12/08/18 1100  BP: (!) 154/67  Pulse: 75  Resp: 16  Temp: 98.3 F (36.8 C)  SpO2: 98%   Filed Weights   12/08/18 1100  Weight: 188 lb (85.3 kg)    Physical Exam Constitutional:      Appearance: Normal appearance. She is normal weight.  Cardiovascular:     Rate and Rhythm: Normal rate and regular rhythm.     Heart sounds: Normal heart sounds.  Pulmonary:     Effort: Pulmonary effort is normal.     Breath sounds: Normal breath sounds.  Musculoskeletal: Normal range of motion.  Skin:    General: Skin is warm and dry.  Neurological:     Mental Status: She is  alert and oriented to person, place, and time. Mental status is at baseline.  Psychiatric:        Mood and Affect: Mood normal.        Behavior: Behavior normal.        Thought Content: Thought content normal.        Judgment: Judgment normal.      LABORATORY DATA:  I have reviewed the labs as listed.  CBC    Component Value Date/Time   WBC 5.2 12/01/2018 1121   RBC 3.84 (L) 12/01/2018 1121   HGB  11.2 (L) 12/01/2018 1121   HCT 36.0 12/01/2018 1121   PLT 205 12/01/2018 1121   MCV 93.8 12/01/2018 1121   MCH 29.2 12/01/2018 1121   MCHC 31.1 12/01/2018 1121   RDW 12.8 12/01/2018 1121   LYMPHSABS 1.8 12/01/2018 1121   MONOABS 0.5 12/01/2018 1121   EOSABS 0.2 12/01/2018 1121   BASOSABS 0.0 12/01/2018 1121   CMP Latest Ref Rng & Units 12/01/2018 11/21/2018 08/04/2018  Glucose 70 - 99 mg/dL 665(L) 935 84  BUN 8 - 23 mg/dL 13 19 15   Creatinine 0.44 - 1.00 mg/dL 7.01 7.79(T) 9.03  Sodium 135 - 145 mmol/L 139 142 143  Potassium 3.5 - 5.1 mmol/L 3.9 4.1 3.8  Chloride 98 - 111 mmol/L 106 105 109  CO2 22 - 32 mmol/L 25 29 28   Calcium 8.9 - 10.3 mg/dL 9.4 00.9 9.5  Total Protein 6.5 - 8.1 g/dL 6.9 - 6.7  Total Bilirubin 0.3 - 1.2 mg/dL 0.7 - 0.4  Alkaline Phos 38 - 126 U/L 68 - -  AST 15 - 41 U/L 20 - 16  ALT 0 - 44 U/L 21 - 14       DIAGNOSTIC IMAGING:  I have independently reviewed the scans and discussed with the patient.   I have reviewed Mathis Bud, NP's note and agree with the documentation.  I personally performed a face-to-face visit, made revisions and my assessment and plan is as follows.    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  Normocytic anemia: -Previously thought to be from chronic blood loss.  There is also component of CKD.  - EGD on 07/18/2017 shows gastritis, duodenitis with no H. pylori. -Colonoscopy on 07/18/2017 shows internal and external hemorrhoids.  -Denies any bleeding per rectum or melena.  No B symptoms or recent  hospitalizations.  - We reviewed her blood work.  Creatinine is slightly elevated on and off.  Hemoglobin was 11.2 with a normal MCV.  Ferritin was 58, down from 117, 6 months ago.  B12 was borderline at 332.  Folic acid was normal. -I have suggested her to take B12 tablet daily.  I will reassess her in 6 months with repeat blood work.      Orders placed this encounter:  Orders Placed This Encounter  Procedures  . CBC with Differential/Platelet  . Comprehensive metabolic panel  . Ferritin  . Iron and TIBC  . Vitamin B12  . Folate  . VITAMIN D 25 Hydroxy (Vit-D Deficiency, Fractures)      Doreatha Massed, MD [facility] [phone]

## 2018-12-08 NOTE — Patient Instructions (Signed)
[facility] at [facility] Discharge Instructions  Please start taking Vitamin B 12 tablet daily. Follow up in    Thank you for choosing  [facility] Center at [facility] to provide your oncology and hematology care.  To afford each patient quality time with our provider, please arrive at least 15 minutes before your scheduled appointment time.   If you have a lab appointment with the Cancer Center please come in thru the  Main Entrance and check in at the main information desk  You need to re-schedule your appointment should you arrive 10 or more minutes late.  We strive to give you quality time with our providers, and arriving late affects you and other patients whose appointments are after yours.  Also, if you no show three or more times for appointments you may be dismissed from the clinic at the providers discretion.     Again, thank you for choosing [facility].  Our hope is that these requests will decrease the amount of time that you wait before being seen by our physicians.       _____________________________________________________________  Should you have questions after your visit to [facility], please contact our office at [phone] between the hours of 8:00 a.m. and 4:30 p.m.  Voicemails left after 4:00 p.m. will not be returned until the following business day.  For prescription refill requests, have your pharmacy contact our office and allow 72 hours.    Cancer Center Support Programs:   > Cancer Support Group  2nd Tuesday of the month 1pm-2pm, Journey Room

## 2018-12-22 ENCOUNTER — Other Ambulatory Visit: Payer: Self-pay | Admitting: Family Medicine

## 2019-01-13 ENCOUNTER — Other Ambulatory Visit: Payer: Self-pay | Admitting: Family Medicine

## 2019-02-11 ENCOUNTER — Other Ambulatory Visit: Payer: Self-pay

## 2019-02-11 MED ORDER — SPIRONOLACTONE 25 MG PO TABS: ORAL_TABLET | ORAL | 1 refills | Status: DC

## 2019-02-16 ENCOUNTER — Other Ambulatory Visit: Payer: Self-pay

## 2019-02-16 MED ORDER — IRBESARTAN 300 MG PO TABS: 300.0000 mg | ORAL_TABLET | Freq: Every day | ORAL | 3 refills | Status: DC

## 2019-02-17 ENCOUNTER — Encounter: Payer: Self-pay | Admitting: Orthopaedic Surgery

## 2019-02-17 ENCOUNTER — Ambulatory Visit (INDEPENDENT_AMBULATORY_CARE_PROVIDER_SITE_OTHER): Payer: Medicare Other | Admitting: Orthopaedic Surgery

## 2019-02-17 ENCOUNTER — Other Ambulatory Visit: Payer: Self-pay

## 2019-02-17 VITALS — Temp 95.9°F | Ht 67.0 in | Wt 188.0 lb

## 2019-02-17 DIAGNOSIS — G8929 Other chronic pain: Secondary | ICD-10-CM | POA: Diagnosis not present

## 2019-02-17 DIAGNOSIS — M25562 Pain in left knee: Secondary | ICD-10-CM

## 2019-02-17 NOTE — Progress Notes (Signed)
CC:  I have pain of my right knee. I would like an injection.  The patient has chronic pain of the right knee.  There is no recent trauma.  There is no redness.  Injections in the past have helped.  The knee has no redness, has an effusion and crepitus present.  ROM of the right knee is 0-105.  Impression:  Chronic knee pain right  Return: as needed  PROCEDURE NOTE:  The patient requests injections of the right knee , verbal consent was obtained.  The right knee was prepped appropriately after time out was performed.   Sterile technique was observed and injection of 1 cc of Depo-Medrol 40 mg with several cc's of plain xylocaine. Anesthesia was provided by ethyl chloride and a 20-gauge needle was used to inject the knee area. The injection was tolerated well.  A band aid dressing was applied.  The patient was advised to apply ice later today and tomorrow to the injection sight as needed.  Electronically Signed Darreld Mclean, MD 4/28/20209:14 AM

## 2019-02-24 ENCOUNTER — Encounter: Payer: Self-pay | Admitting: *Deleted

## 2019-03-02 DIAGNOSIS — E119 Type 2 diabetes mellitus without complications: Secondary | ICD-10-CM | POA: Diagnosis not present

## 2019-03-02 DIAGNOSIS — E785 Hyperlipidemia, unspecified: Secondary | ICD-10-CM | POA: Diagnosis not present

## 2019-03-02 DIAGNOSIS — Z794 Long term (current) use of insulin: Secondary | ICD-10-CM | POA: Diagnosis not present

## 2019-03-03 ENCOUNTER — Ambulatory Visit (INDEPENDENT_AMBULATORY_CARE_PROVIDER_SITE_OTHER): Payer: Medicare Other | Admitting: Family Medicine

## 2019-03-03 ENCOUNTER — Encounter: Payer: Self-pay | Admitting: Family Medicine

## 2019-03-03 VITALS — BP 130/62 | Ht 67.0 in | Wt 188.0 lb

## 2019-03-03 DIAGNOSIS — D649 Anemia, unspecified: Secondary | ICD-10-CM | POA: Diagnosis not present

## 2019-03-03 DIAGNOSIS — Z1231 Encounter for screening mammogram for malignant neoplasm of breast: Secondary | ICD-10-CM

## 2019-03-03 DIAGNOSIS — E785 Hyperlipidemia, unspecified: Secondary | ICD-10-CM

## 2019-03-03 DIAGNOSIS — Z7189 Other specified counseling: Secondary | ICD-10-CM | POA: Diagnosis not present

## 2019-03-03 DIAGNOSIS — I10 Essential (primary) hypertension: Secondary | ICD-10-CM

## 2019-03-03 DIAGNOSIS — E663 Overweight: Secondary | ICD-10-CM | POA: Diagnosis not present

## 2019-03-03 DIAGNOSIS — E119 Type 2 diabetes mellitus without complications: Secondary | ICD-10-CM

## 2019-03-03 DIAGNOSIS — Z794 Long term (current) use of insulin: Secondary | ICD-10-CM

## 2019-03-03 DIAGNOSIS — K219 Gastro-esophageal reflux disease without esophagitis: Secondary | ICD-10-CM | POA: Diagnosis not present

## 2019-03-03 DIAGNOSIS — IMO0001 Reserved for inherently not codable concepts without codable children: Secondary | ICD-10-CM

## 2019-03-03 LAB — COMPLETE METABOLIC PANEL WITH GFR
AG Ratio: 1.8 (calc) (ref 1.0–2.5)
ALT: 19 U/L (ref 6–29)
AST: 18 U/L (ref 10–35)
Albumin: 4.3 g/dL (ref 3.6–5.1)
Alkaline phosphatase (APISO): 76 U/L (ref 37–153)
BUN/Creatinine Ratio: 21 (calc) (ref 6–22)
BUN: 23 mg/dL (ref 7–25)
CO2: 27 mmol/L (ref 20–32)
Calcium: 9.8 mg/dL (ref 8.6–10.4)
Chloride: 108 mmol/L (ref 98–110)
Creat: 1.12 mg/dL — ABNORMAL HIGH (ref 0.60–0.93)
GFR, Est African American: 54 mL/min/{1.73_m2} — ABNORMAL LOW (ref >=60)
GFR, Est Non African American: 47 mL/min/{1.73_m2} — ABNORMAL LOW (ref >=60)
Globulin: 2.4 g/dL (calc) (ref 1.9–3.7)
Glucose, Bld: 133 mg/dL — ABNORMAL HIGH (ref 65–99)
Potassium: 4.1 mmol/L (ref 3.5–5.3)
Sodium: 141 mmol/L (ref 135–146)
Total Bilirubin: 0.4 mg/dL (ref 0.2–1.2)
Total Protein: 6.7 g/dL (ref 6.1–8.1)

## 2019-03-03 LAB — LIPID PANEL
Cholesterol: 162 mg/dL (ref <200)
HDL: 50 mg/dL (ref >=50)
LDL CHOLESTEROL (CALC): 93 mg/dL
Non-HDL Cholesterol (Calc): 112 mg/dL (calc) (ref <130)
Total CHOL/HDL Ratio: 3.2 (calc) (ref <5.0)
Triglycerides: 92 mg/dL (ref <150)

## 2019-03-03 LAB — HEMOGLOBIN A1C
Hgb A1c MFr Bld: 7.4 % of total Hgb — ABNORMAL HIGH (ref <5.7)
Mean Plasma Glucose: 166 (calc)
eAG (mmol/L): 9.2 (calc)

## 2019-03-03 NOTE — Progress Notes (Signed)
Virtual Visit via Telephone Note  I connected with Kimberly KaufmannAnne M Hartwell on 03/03/19 at  9:00 AM EDT by telephone and verified that I am speaking with the correct person using two identifiers.  Location: Patient: home Provider: office   I discussed the limitations, risks, security and privacy concerns of performing an evaluation and management service by telephone and the availability of in person appointments. I also discussed with the patient that there may be a patient responsible charge related to this service. The patient expressed understanding and agreed to proceed.  This visit type is conducted due to national recommendations for restrictions regarding the COVID -19 Pandemic. Due to the patient's age and / or co morbidities, this format is felt to be most appropriate at this time without adequate follow up. The patient has no access to video technology/ had technical difficulties with video, requiring transitioning to audio format  only ( telephone ). All issues noted this document were discussed and addressed,no physical exam can be performed in this format.   History of Present Illness: F/U chronic problems , medication management and lab review No new concerns/ specific complaints, doing "well" overall Denies recent fever or chills. Denies sinus pressure, nasal congestion, ear pain or sore throat. Denies chest congestion, productive cough or wheezing. Denies chest pains, palpitations and leg swelling Denies abdominal pain, nausea, vomiting,diarrhea or constipation.   Denies dysuria, frequency, hesitancy or incontinence. Denies uncontrolled  joint pain, swelling and limitation in mobility. Denies headaches, seizures, numbness, or tingling. Denies depression, anxiety or insomnia. Denies skin break down or rash. Denies polyuria, polydipsia, blurred vision , or hypoglycemic episodes.       Observations/Objective: BP 130/62   Ht 5\' 7"  (1.702 m)   Wt 188 lb (85.3 kg)   BMI 29.44  kg/m  Good communication with no confusion and intact memory. Alert and oriented x 3 No signs of respiratory distress during sppech    Assessment and Plan:  Diabetes mellitus, insulin dependent (IDDM), controlled (HCC) Controlled, no change in medication Kimberly Freeman is reminded of the importance of commitment to daily physical activity for 30 minutes or more, as able and the need to limit carbohydrate intake to 30 to 60 grams per meal to help with blood sugar control.   The need to take medication as prescribed, test blood sugar as directed, and to call between visits if there is a concern that blood sugar is uncontrolled is also discussed.   Kimberly Freeman is reminded of the importance of daily foot exam, annual eye examination, and good blood sugar, blood pressure and cholesterol control.  Diabetic Labs Latest Ref Rng & Units 03/02/2019 12/01/2018 11/21/2018 08/04/2018 05/30/2018  HbA1c <5.7 % of total Hgb 7.4(H) - 7.1(H) 7.1(H) -  Microalbumin mg/dL - - - - -  Micro/Creat Ratio <30 mcg/mg creat - - - - -  Chol <200 mg/dL 161162 - - 096138 -  HDL > OR = 50 mg/dL 50 - - 04(V43(L) -  Calc LDL mg/dL (calc) 93 - - 78 -  Triglycerides <150 mg/dL 92 - - 90 -  Creatinine 0.60 - 0.93 mg/dL 4.09(W1.12(H) 1.190.96 1.47(W1.09(H) 2.950.91 1.06(H)   BP/Weight 03/03/2019 02/17/2019 12/08/2018 09/22/2018 08/05/2018 06/18/2018 06/04/2018  Systolic BP 130 - 154 170 130 621134 149  Diastolic BP 62 - 67 80 60 60 53  Wt. (Lbs) 188 188 188 186 186 189 188.4  BMI 29.44 29.44 29.44 29.13 29.13 29.6 29.51   Foot/eye exam completion dates Latest Ref Rng & Units 08/05/2018  07/25/2017  Eye Exam No Retinopathy - -  Foot exam Order - - -  Foot Form Completion - Done Done        Hyperlipidemia LDL goal <100 Hyperlipidemia:Low fat diet discussed and encouraged.   Lipid Panel  Lab Results  Component Value Date   CHOL 162 03/02/2019   HDL 50 03/02/2019   LDLCALC 93 03/02/2019   TRIG 92 03/02/2019   CHOLHDL 3.2 03/02/2019  Controlled, no  change in medication      Malignant hypertension Needs in office eval DASH diet and commitment to daily physical activity for a minimum of 30 minutes discussed and encouraged, as a part of hypertension management. The importance of attaining a healthy weight is also discussed.  BP/Weight 03/03/2019 02/17/2019 12/08/2018 09/22/2018 08/05/2018 06/18/2018 06/04/2018  Systolic BP 130 - 154 170 130 466 149  Diastolic BP 62 - 67 80 60 60 53  Wt. (Lbs) 188 188 188 186 186 189 188.4  BMI 29.44 29.44 29.44 29.13 29.13 29.6 29.51       Overweight (BMI 25.0-29.9) Unchanged  Patient re-educated about  the importance of commitment to a  minimum of 150 minutes of exercise per week as able.  The importance of healthy food choices with portion control discussed, as well as eating regularly and within a 12 hour window most days. The need to choose "clean , green" food 50 to 75% of the time is discussed, as well as to make water the primary drink and set a goal of 64 ounces water daily.     Weight /BMI 03/03/2019 02/17/2019 12/08/2018  WEIGHT 188 lb 188 lb 188 lb  HEIGHT 5\' 7"  5\' 7"  -  BMI 29.44 kg/m2 29.44 kg/m2 29.44 kg/m2      Esophageal reflux Controlled, no change in medication   Educated About Covid-19 Virus Infection Covid-19 Education  The signs and symptoms of of COVID -19 were discussed with the patient and how to seek care for testing. ( follow up with PCP or arrange  E-visit) The importance of social  distancing is discussed today.    Follow Up Instructions:    I discussed the assessment and treatment plan with the patient. The patient was provided an opportunity to ask questions and all were answered. The patient agreed with the plan and demonstrated an understanding of the instructions.   The patient was advised to call back or seek an in-person evaluation if the symptoms worsen or if the condition fails to improve as anticipated.  I provided 25 minutes of  non-face-to-face time during this encounter.   Kimberly Overman, MD

## 2019-03-03 NOTE — Patient Instructions (Addendum)
F/U with home  blood pressure cuff first week in October, needs in office blood pressure evaluation, call if you need me before  Congrats  on excellent labs, keep up the great work  No medication changes  Ensure that you drink 64 ounces water daily  Non fasting hBA1C, chem 7 and EGFR, , tSH and  microalb last week in September please  It is important that you exercise regularly at least 30 minutes 5 times a week. If you develop chest pain, have severe difficulty breathing, or feel very tired, stop exercising immediately and seek medical attention  Think about what you will eat, plan ahead. Choose " clean, green, fresh or frozen" over canned, processed or packaged foods which are more sugary, salty and fatty. 70 to 75% of food eaten should be vegetables and fruit. Three meals at set times with snacks allowed between meals, but they must be fruit or vegetables. Aim to eat over a 12 hour period , example 7 am to 7 pm, and STOP after  your last meal of the day. Drink water,generally about 64 ounces per day, no other drink is as healthy. Fruit juice is best enjoyed in a healthy way, by EATING the fruit.   Thanks for choosing [facility], we consider it a privelige to serve you.

## 2019-03-08 ENCOUNTER — Encounter: Payer: Self-pay | Admitting: Family Medicine

## 2019-03-08 DIAGNOSIS — Z7189 Other specified counseling: Secondary | ICD-10-CM | POA: Insufficient documentation

## 2019-03-08 NOTE — Assessment & Plan Note (Signed)
Covid-19 Education  The signs and symptoms of of COVID -19 were discussed with the patient and how to seek care for testing. ( follow up with PCP or arrange  E-visit) The importance of social  distancing is discussed today.  

## 2019-03-08 NOTE — Assessment & Plan Note (Signed)
Unchanged  Patient re-educated about  the importance of commitment to a  minimum of 150 minutes of exercise per week as able.  The importance of healthy food choices with portion control discussed, as well as eating regularly and within a 12 hour window most days. The need to choose "clean , green" food 50 to 75% of the time is discussed, as well as to make water the primary drink and set a goal of 64 ounces water daily.     Weight /BMI 03/03/2019 02/17/2019 12/08/2018  WEIGHT 188 lb 188 lb 188 lb  HEIGHT 5\' 7"  5\' 7"  -  BMI 29.44 kg/m2 29.44 kg/m2 29.44 kg/m2

## 2019-03-08 NOTE — Assessment & Plan Note (Signed)
Controlled, no change in medication  

## 2019-03-08 NOTE — Assessment & Plan Note (Signed)
Controlled, no change in medication Kimberly Freeman is reminded of the importance of commitment to daily physical activity for 30 minutes or more, as able and the need to limit carbohydrate intake to 30 to 60 grams per meal to help with blood sugar control.   The need to take medication as prescribed, test blood sugar as directed, and to call between visits if there is a concern that blood sugar is uncontrolled is also discussed.   Kimberly Freeman is reminded of the importance of daily foot exam, annual eye examination, and good blood sugar, blood pressure and cholesterol control.  Diabetic Labs Latest Ref Rng & Units 03/02/2019 12/01/2018 11/21/2018 08/04/2018 05/30/2018  HbA1c <5.7 % of total Hgb 7.4(H) - 7.1(H) 7.1(H) -  Microalbumin mg/dL - - - - -  Micro/Creat Ratio <30 mcg/mg creat - - - - -  Chol <200 mg/dL 599 - - 357 -  HDL > OR = 50 mg/dL 50 - - 01(X) -  Calc LDL mg/dL (calc) 93 - - 78 -  Triglycerides <150 mg/dL 92 - - 90 -  Creatinine 0.60 - 0.93 mg/dL 7.93(J) 0.30 0.92(Z) 3.00 1.06(H)   BP/Weight 03/03/2019 02/17/2019 12/08/2018 09/22/2018 08/05/2018 06/18/2018 06/04/2018  Systolic BP 130 - 154 170 130 762 149  Diastolic BP 62 - 67 80 60 60 53  Wt. (Lbs) 188 188 188 186 186 189 188.4  BMI 29.44 29.44 29.44 29.13 29.13 29.6 29.51   Foot/eye exam completion dates Latest Ref Rng & Units 08/05/2018 07/25/2017  Eye Exam No Retinopathy - -  Foot exam Order - - -  Foot Form Completion - Done Done

## 2019-03-08 NOTE — Assessment & Plan Note (Signed)
Needs in office eval DASH diet and commitment to daily physical activity for a minimum of 30 minutes discussed and encouraged, as a part of hypertension management. The importance of attaining a healthy weight is also discussed.  BP/Weight 03/03/2019 02/17/2019 12/08/2018 09/22/2018 08/05/2018 06/18/2018 06/04/2018  Systolic BP 130 - 154 170 130 295 149  Diastolic BP 62 - 67 80 60 60 53  Wt. (Lbs) 188 188 188 186 186 189 188.4  BMI 29.44 29.44 29.44 29.13 29.13 29.6 29.51

## 2019-03-08 NOTE — Assessment & Plan Note (Signed)
Hyperlipidemia:Low fat diet discussed and encouraged.   Lipid Panel  Lab Results  Component Value Date   CHOL 162 03/02/2019   HDL 50 03/02/2019   LDLCALC 93 03/02/2019   TRIG 92 03/02/2019   CHOLHDL 3.2 03/02/2019  Controlled, no change in medication

## 2019-03-17 ENCOUNTER — Telehealth: Payer: Self-pay | Admitting: Family Medicine

## 2019-03-17 ENCOUNTER — Other Ambulatory Visit: Payer: Self-pay

## 2019-03-17 MED ORDER — GLIPIZIDE ER 5 MG PO TB24: 5.0000 mg | ORAL_TABLET | Freq: Every day | ORAL | 3 refills | Status: DC

## 2019-03-17 NOTE — Telephone Encounter (Signed)
Med sent in.

## 2019-03-17 NOTE — Telephone Encounter (Signed)
Please send to [place] home delivery,   glipiZIDE (GLUCOTROL XL) 5 MG 24 hr tablet

## 2019-06-04 ENCOUNTER — Other Ambulatory Visit (HOSPITAL_COMMUNITY): Payer: Medicare Other

## 2019-06-11 ENCOUNTER — Ambulatory Visit (HOSPITAL_COMMUNITY): Payer: Medicare Other | Admitting: Hematology

## 2019-07-01 ENCOUNTER — Inpatient Hospital Stay (HOSPITAL_COMMUNITY): Payer: Medicare Other

## 2019-07-08 ENCOUNTER — Ambulatory Visit (HOSPITAL_COMMUNITY): Payer: Medicare Other | Admitting: Hematology

## 2019-07-22 ENCOUNTER — Other Ambulatory Visit: Payer: Self-pay

## 2019-07-22 ENCOUNTER — Other Ambulatory Visit: Payer: Self-pay | Admitting: Gastroenterology

## 2019-07-22 ENCOUNTER — Telehealth: Payer: Self-pay | Admitting: *Deleted

## 2019-07-22 MED ORDER — METOPROLOL TARTRATE 50 MG PO TABS: ORAL_TABLET | ORAL | 0 refills | Status: DC

## 2019-07-22 MED ORDER — METOPROLOL TARTRATE 50 MG PO TABS: ORAL_TABLET | ORAL | 1 refills | Status: DC

## 2019-07-22 NOTE — Telephone Encounter (Signed)
Pt called is out of metoprolol [facility] she is completely out of the medication. She needs a temporary supply sent to [facility] in [place] so that she isnt out of the meds for awhile. Pt would like a call back when this is sent in

## 2019-07-22 NOTE — Telephone Encounter (Signed)
[facility] supply to [facility]. She requested a call back when done. Can you let her know for me? Thanks

## 2019-07-22 NOTE — Telephone Encounter (Signed)
LVM to let her know medications were sent in

## 2019-07-24 DIAGNOSIS — Z794 Long term (current) use of insulin: Secondary | ICD-10-CM | POA: Diagnosis not present

## 2019-07-24 DIAGNOSIS — E119 Type 2 diabetes mellitus without complications: Secondary | ICD-10-CM | POA: Diagnosis not present

## 2019-07-27 ENCOUNTER — Other Ambulatory Visit: Payer: Self-pay

## 2019-07-27 ENCOUNTER — Encounter: Payer: Self-pay | Admitting: Family Medicine

## 2019-07-27 ENCOUNTER — Ambulatory Visit: Payer: Medicare Other | Admitting: Family Medicine

## 2019-07-27 VITALS — BP 138/72 | HR 78 | Temp 98.3°F | Ht 67.0 in | Wt 196.0 lb

## 2019-07-27 DIAGNOSIS — N183 Chronic kidney disease, stage 3 unspecified: Secondary | ICD-10-CM

## 2019-07-27 DIAGNOSIS — E785 Hyperlipidemia, unspecified: Secondary | ICD-10-CM

## 2019-07-27 DIAGNOSIS — R7989 Other specified abnormal findings of blood chemistry: Secondary | ICD-10-CM

## 2019-07-27 DIAGNOSIS — Z794 Long term (current) use of insulin: Secondary | ICD-10-CM | POA: Diagnosis not present

## 2019-07-27 DIAGNOSIS — Z23 Encounter for immunization: Secondary | ICD-10-CM | POA: Diagnosis not present

## 2019-07-27 DIAGNOSIS — F439 Reaction to severe stress, unspecified: Secondary | ICD-10-CM

## 2019-07-27 DIAGNOSIS — I10 Essential (primary) hypertension: Secondary | ICD-10-CM

## 2019-07-27 DIAGNOSIS — E6609 Other obesity due to excess calories: Secondary | ICD-10-CM

## 2019-07-27 DIAGNOSIS — Z6830 Body mass index (BMI) 30.0-30.9, adult: Secondary | ICD-10-CM

## 2019-07-27 DIAGNOSIS — E1122 Type 2 diabetes mellitus with diabetic chronic kidney disease: Secondary | ICD-10-CM | POA: Diagnosis not present

## 2019-07-27 MED ORDER — AMLODIPINE BESYLATE 2.5 MG PO TABS: 2.5000 mg | ORAL_TABLET | Freq: Every day | ORAL | 1 refills | Status: DC

## 2019-07-27 NOTE — Assessment & Plan Note (Signed)
  Patient re-educated about  the importance of commitment to a  minimum of 150 minutes of exercise per week as able.  The importance of healthy food choices with portion control discussed, as well as eating regularly and within a 12 hour window most days. The need to choose "clean , green" food 50 to 75% of the time is discussed, as well as to make water the primary drink and set a goal of 64 ounces water daily.    Weight /BMI 07/27/2019 03/03/2019 02/17/2019  WEIGHT 196 lb 188 lb 188 lb  HEIGHT 5\' 7"  5\' 7"  5\' 7"   BMI 30.7 kg/m2 29.44 kg/m2 29.44 kg/m2

## 2019-07-27 NOTE — Assessment & Plan Note (Signed)
Elevaye, needs T3 and T 4 and [place]

## 2019-07-27 NOTE — Progress Notes (Addendum)
Kimberly Freeman     MRN: [id number]      DOB: June 05, 1941   HPI Kimberly Freeman is here for follow up and re-evaluation of chronic medical conditions, medication management and review of any available recent lab and radiology data.  Preventive health is updated, specifically  Cancer screening and Immunization.   NO ADverse reactions to current meds  C/o solid dysphagia, in neck, worsening over past several months.  C/o anxiety and stress at home due to deterioration in her husband's health  Denies polyuria, polydipsia, blurred vision , or hypoglycemic episodes.   ROS Denies recent fever or chills. Denies sinus pressure, nasal congestion, ear pain or sore throat. Denies chest congestion, productive cough or wheezing. Denies chest pains, palpitations and leg swelling Denies abdominal pain, nausea, vomiting,diarrhea or constipation.   Denies dysuria, frequency, hesitancy or incontinence. Denies  Uncontrolled joint pain, swelling and limitation in mobility. Denies headaches, seizures, numbness, or tingling.  Denies skin break down or rash.   PE  BP 138/72   Pulse 78   Temp 98.3 F (36.8 C) (Temporal)   Ht 5\' 7"  (1.702 m)   Wt 196 lb (88.9 kg)   SpO2 96%   BMI 30.70 kg/m   Patient alert and oriented and in no cardiopulmonary distress.  HEENT: No facial asymmetry, EOMI,   oropharynx pink and moist.  Neck supple no JVD, no mass.  Chest: Clear to auscultation bilaterally.  CVS: S1, S2 no murmurs, no S3.Regular rate.  ABD: Soft non tender.   Ext: No edema  MS: Adequate though reduced  ROM spine, shoulders, hips and knees.  Skin: Intact, no ulcerations or rash noted.  Psych: Good eye contact, normal affect. Memory intact mildly anxious or depressed appearing.  CNS: CN 2-12 intact, power, grossly  normal throughout.no focal deficits noted.   Assessment & Plan  Malignant hypertension Stop spironolactone due to deteriorating rena;l function, start amlodipine 2.5 mg and  continue avapro DASH diet and commitment to daily physical activity for a minimum of 30 minutes discussed and encouraged, as a part of hypertension management. The importance of attaining a healthy weight is also discussed.  BP/Weight 07/27/2019 03/03/2019 02/17/2019 12/08/2018 09/22/2018 08/05/2018 06/18/2018  Systolic BP 138 130 - 154 170 06/20/2018 134  Diastolic BP 72 62 - 67 80 60 60  Wt. (Lbs) 196 188 188 188 186 186 189  BMI 30.7 29.44 29.44 29.44 29.13 29.13 29.6  re eval in 5 weeks     Stress at home Increased and uncontrolled deterioration in spouse's health, he will be brought in for eval  Abnormal TSH Elevaye, needs T3 and T 4 and 630  Hyperlipidemia LDL goal <100 Hyperlipidemia:Low fat diet discussed and encouraged.   Lipid Panel  Lab Results  Component Value Date   CHOL 162 03/02/2019   HDL 50 03/02/2019   LDLCALC 93 03/02/2019   TRIG 92 03/02/2019   CHOLHDL 3.2 03/02/2019  controlled in May, will repeat in 2021     Obese  Patient re-educated about  the importance of commitment to a  minimum of 150 minutes of exercise per week as able.  The importance of healthy food choices with portion control discussed, as well as eating regularly and within a 12 hour window most days. The need to choose "clean , green" food 50 to 75% of the time is discussed, as well as to make water the primary drink and set a goal of 64 ounces water daily.    Weight /BMI 07/27/2019  03/03/2019 02/17/2019  WEIGHT 196 lb 188 lb 188 lb  HEIGHT 5\' 7"  5\' 7"  5\' 7"   BMI 30.7 kg/m2 29.44 kg/m2 29.44 kg/m2      Type 2 diabetes mellitus with diabetic chronic kidney disease (HCC) Controlled, stop metformin due to deterioration in renal function Kimberly Freeman is reminded of the importance of commitment to daily physical activity for 30 minutes or more, as able and the need to limit carbohydrate intake to 30 to 60 grams per meal to help with blood sugar control.   The need to take medication as prescribed,  test blood sugar as directed, and to call between visits if there is a concern that blood sugar is uncontrolled is also discussed.   Kimberly Freeman is reminded of the importance of daily foot exam, annual eye examination, and good blood sugar, blood pressure and cholesterol control.  Diabetic Labs Latest Ref Rng & Units 07/24/2019 03/02/2019 12/01/2018 11/21/2018 08/04/2018  HbA1c <5.7 % of total Hgb 7.5(H) 7.4(H) - 7.1(H) 7.1(H)  Microalbumin mg/dL 1.5 - - - -  Micro/Creat Ratio <30 mcg/mg creat 9 - - - -  Chol <200 mg/dL - 162 - - 138  HDL > OR = 50 mg/dL - 50 - - 43(L)  Calc LDL mg/dL (calc) - 93 - - 78  Triglycerides <150 mg/dL - 92 - - 90  Creatinine 0.60 - 0.88 mg/dL 1.24(H) 1.12(H) 0.96 1.09(H) 0.91   BP/Weight 07/27/2019 03/03/2019 02/17/2019 12/08/2018 09/22/2018 08/05/2018 2/45/8099  Systolic BP 833 825 - 053 976 734 193  Diastolic BP 72 62 - 67 80 60 60  Wt. (Lbs) 196 188 188 188 186 186 189  BMI 30.7 29.44 29.44 29.44 29.13 29.13 29.6   Foot/eye exam completion dates Latest Ref Rng & Units 08/05/2018 07/25/2017  Eye Exam No Retinopathy - -  Foot exam Order - - -  Foot Form Completion - Done Done

## 2019-07-27 NOTE — Assessment & Plan Note (Signed)
Controlled, stop metformin due to deterioration in renal function Kimberly Freeman is reminded of the importance of commitment to daily physical activity for 30 minutes or more, as able and the need to limit carbohydrate intake to 30 to 60 grams per meal to help with blood sugar control.   The need to take medication as prescribed, test blood sugar as directed, and to call between visits if there is a concern that blood sugar is uncontrolled is also discussed.   Kimberly Freeman is reminded of the importance of daily foot exam, annual eye examination, and good blood sugar, blood pressure and cholesterol control.  Diabetic Labs Latest Ref Rng & Units 07/24/2019 03/02/2019 12/01/2018 11/21/2018 08/04/2018  HbA1c <5.7 % of total Hgb 7.5(H) 7.4(H) - 7.1(H) 7.1(H)  Microalbumin mg/dL 1.5 - - - -  Micro/Creat Ratio <30 mcg/mg creat 9 - - - -  Chol <200 mg/dL - 162 - - 138  HDL > OR = 50 mg/dL - 50 - - 43(L)  Calc LDL mg/dL (calc) - 93 - - 78  Triglycerides <150 mg/dL - 92 - - 90  Creatinine 0.60 - 0.88 mg/dL 1.24(H) 1.12(H) 0.96 1.09(H) 0.91   BP/Weight 07/27/2019 03/03/2019 02/17/2019 12/08/2018 09/22/2018 08/05/2018 4/88/8916  Systolic BP 945 038 - 882 800 349 179  Diastolic BP 72 62 - 67 80 60 60  Wt. (Lbs) 196 188 188 188 186 186 189  BMI 30.7 29.44 29.44 29.44 29.13 29.13 29.6   Foot/eye exam completion dates Latest Ref Rng & Units 08/05/2018 07/25/2017  Eye Exam No Retinopathy - -  Foot exam Order - - -  Foot Form Completion - Done Done

## 2019-07-27 NOTE — Patient Instructions (Signed)
F/u in office with MD BP re eval ,2nd week  in November, call if you need me before   Flu vaccine today  Please STOP metformin and spironolactone today, because of kidney function  New is amlodipine for your blood pressure in place of the spironolactone  Excellent blood sugar.   You are referred for an Ultrasound of your thyroid gland, we will call with appt info

## 2019-07-27 NOTE — Assessment & Plan Note (Signed)
Increased and uncontrolled deterioration in spouse's health, he will be brought in for eval

## 2019-07-27 NOTE — Assessment & Plan Note (Signed)
Stop spironolactone due to deteriorating rena;l function, start amlodipine 2.5 mg and continue avapro DASH diet and commitment to daily physical activity for a minimum of 30 minutes discussed and encouraged, as a part of hypertension management. The importance of attaining a healthy weight is also discussed.  BP/Weight 07/27/2019 03/03/2019 02/17/2019 12/08/2018 09/22/2018 08/05/2018 2/62/0355  Systolic BP 974 163 - 845 364 680 321  Diastolic BP 72 62 - 67 80 60 60  Wt. (Lbs) 196 188 188 188 186 186 189  BMI 30.7 29.44 29.44 29.44 29.13 29.13 29.6  re eval in 5 weeks

## 2019-07-27 NOTE — Assessment & Plan Note (Signed)
Hyperlipidemia:Low fat diet discussed and encouraged.   Lipid Panel  Lab Results  Component Value Date   CHOL 162 03/02/2019   HDL 50 03/02/2019   LDLCALC 93 03/02/2019   TRIG 92 03/02/2019   CHOLHDL 3.2 03/02/2019  controlled in May, will repeat in 2021

## 2019-07-28 ENCOUNTER — Inpatient Hospital Stay (HOSPITAL_COMMUNITY): Payer: Medicare Other | Attending: Hematology

## 2019-07-28 ENCOUNTER — Telehealth: Payer: Self-pay

## 2019-07-28 DIAGNOSIS — N189 Chronic kidney disease, unspecified: Secondary | ICD-10-CM | POA: Insufficient documentation

## 2019-07-28 DIAGNOSIS — D631 Anemia in chronic kidney disease: Secondary | ICD-10-CM | POA: Diagnosis not present

## 2019-07-28 DIAGNOSIS — E1122 Type 2 diabetes mellitus with diabetic chronic kidney disease: Secondary | ICD-10-CM

## 2019-07-28 DIAGNOSIS — Z794 Long term (current) use of insulin: Secondary | ICD-10-CM

## 2019-07-28 DIAGNOSIS — D500 Iron deficiency anemia secondary to blood loss (chronic): Secondary | ICD-10-CM

## 2019-07-28 LAB — CBC WITH DIFFERENTIAL/PLATELET
Abs Immature Granulocytes: 0.01 10*3/uL (ref 0.00–0.07)
Basophils Absolute: 0 10*3/uL (ref 0.0–0.1)
Basophils Relative: 1 %
Eosinophils Absolute: 0.4 10*3/uL (ref 0.0–0.5)
Eosinophils Relative: 6 %
HCT: 35.8 % — ABNORMAL LOW (ref 36.0–46.0)
Hemoglobin: 11.3 g/dL — ABNORMAL LOW (ref 12.0–15.0)
Immature Granulocytes: 0 %
Lymphocytes Relative: 37 %
Lymphs Abs: 2.4 10*3/uL (ref 0.7–4.0)
MCH: 30.3 pg (ref 26.0–34.0)
MCHC: 31.6 g/dL (ref 30.0–36.0)
MCV: 96 fL (ref 80.0–100.0)
Monocytes Absolute: 0.6 10*3/uL (ref 0.1–1.0)
Monocytes Relative: 9 %
Neutro Abs: 3 10*3/uL (ref 1.7–7.7)
Neutrophils Relative %: 47 %
Platelets: 220 10*3/uL (ref 150–400)
RBC: 3.73 MIL/uL — ABNORMAL LOW (ref 3.87–5.11)
RDW: 12.6 % (ref 11.5–15.5)
WBC: 6.6 10*3/uL (ref 4.0–10.5)
nRBC: 0 % (ref 0.0–0.2)

## 2019-07-28 LAB — COMPREHENSIVE METABOLIC PANEL
ALT: 26 U/L (ref 0–44)
AST: 24 U/L (ref 15–41)
Albumin: 3.9 g/dL (ref 3.5–5.0)
Alkaline Phosphatase: 72 U/L (ref 38–126)
Anion gap: 6 (ref 5–15)
BUN: 18 mg/dL (ref 8–23)
CO2: 30 mmol/L (ref 22–32)
Calcium: 9.4 mg/dL (ref 8.9–10.3)
Chloride: 103 mmol/L (ref 98–111)
Creatinine, Ser: 1.25 mg/dL — ABNORMAL HIGH (ref 0.44–1.00)
GFR calc Af Amer: 47 mL/min — ABNORMAL LOW (ref >60)
GFR calc non Af Amer: 41 mL/min — ABNORMAL LOW (ref >60)
Glucose, Bld: 177 mg/dL — ABNORMAL HIGH (ref 70–99)
Potassium: 4.7 mmol/L (ref 3.5–5.1)
Sodium: 139 mmol/L (ref 135–145)
Total Bilirubin: 0.4 mg/dL (ref 0.3–1.2)
Total Protein: 6.9 g/dL (ref 6.5–8.1)

## 2019-07-28 LAB — TSH: TSH: 6.85 mIU/L — ABNORMAL HIGH (ref 0.40–4.50)

## 2019-07-28 LAB — HEMOGLOBIN A1C
Hgb A1c MFr Bld: 7.5 % of total Hgb — ABNORMAL HIGH (ref <5.7)
Mean Plasma Glucose: 169 (calc)
eAG (mmol/L): 9.3 (calc)

## 2019-07-28 LAB — TEST AUTHORIZATION

## 2019-07-28 LAB — BASIC METABOLIC PANEL WITH GFR
BUN/Creatinine Ratio: 17 (calc) (ref 6–22)
BUN: 21 mg/dL (ref 7–25)
CO2: 30 mmol/L (ref 20–32)
Calcium: 9.7 mg/dL (ref 8.6–10.4)
Chloride: 106 mmol/L (ref 98–110)
Creat: 1.24 mg/dL — ABNORMAL HIGH (ref 0.60–0.88)
GFR, Est African American: 48 mL/min/{1.73_m2} — ABNORMAL LOW (ref >=60)
GFR, Est Non African American: 41 mL/min/{1.73_m2} — ABNORMAL LOW (ref >=60)
Glucose, Bld: 120 mg/dL — ABNORMAL HIGH (ref 65–99)
Potassium: 4.3 mmol/L (ref 3.5–5.3)
Sodium: 142 mmol/L (ref 135–146)

## 2019-07-28 LAB — FERRITIN: Ferritin: 76 ng/mL (ref 11–307)

## 2019-07-28 LAB — T3, FREE: T3, Free: 2.9 pg/mL (ref 2.3–4.2)

## 2019-07-28 LAB — VITAMIN B12: Vitamin B-12: 1191 pg/mL — ABNORMAL HIGH (ref 180–914)

## 2019-07-28 LAB — MICROALBUMIN / CREATININE URINE RATIO
Creatinine, Urine: 172 mg/dL (ref 20–275)
Microalb Creat Ratio: 9 mcg/mg creat (ref <30)
Microalb, Ur: 1.5 mg/dL

## 2019-07-28 LAB — IRON AND TIBC
Iron: 63 ug/dL (ref 28–170)
Saturation Ratios: 18 % (ref 10.4–31.8)
TIBC: 357 ug/dL (ref 250–450)
UIBC: 294 ug/dL

## 2019-07-28 LAB — T4, FREE: Free T4: 0.8 ng/dL (ref 0.8–1.8)

## 2019-07-28 LAB — VITAMIN D 25 HYDROXY (VIT D DEFICIENCY, FRACTURES): Vit D, 25-Hydroxy: 41.5 ng/mL (ref 30–100)

## 2019-07-28 LAB — FOLATE: Folate: 88.3 ng/mL (ref >5.9)

## 2019-07-28 NOTE — Telephone Encounter (Signed)
-----  Message from Fayrene Helper, MD sent at 07/27/2019 12:27 PM EDT ----- Regarding: pls she needs non fast chem 7 and EGFR 3 to 5 days before her f/u, thanks! dx is CKD

## 2019-07-30 ENCOUNTER — Other Ambulatory Visit: Payer: Self-pay

## 2019-07-30 ENCOUNTER — Encounter: Payer: Self-pay | Admitting: Orthopaedic Surgery

## 2019-07-30 ENCOUNTER — Ambulatory Visit (INDEPENDENT_AMBULATORY_CARE_PROVIDER_SITE_OTHER): Payer: Medicare Other | Admitting: Orthopaedic Surgery

## 2019-07-30 DIAGNOSIS — M25562 Pain in left knee: Secondary | ICD-10-CM | POA: Diagnosis not present

## 2019-07-30 DIAGNOSIS — G8929 Other chronic pain: Secondary | ICD-10-CM

## 2019-07-30 NOTE — Progress Notes (Signed)
PROCEDURE NOTE:  The patient requests injections of the left knee , verbal consent was obtained.  The left knee was prepped appropriately after time out was performed.   Sterile technique was observed and injection of 1 cc of Depo-Medrol 40 mg with several cc's of plain xylocaine. Anesthesia was provided by ethyl chloride and a 20-gauge needle was used to inject the knee area. The injection was tolerated well.  A band aid dressing was applied.  The patient was advised to apply ice later today and tomorrow to the injection sight as needed.  Call if any problem.  Precautions discussed.   Electronically Signed Sanjuana Kava, MD 10/8/202011:46 AM

## 2019-08-04 ENCOUNTER — Ambulatory Visit (HOSPITAL_COMMUNITY): Payer: Medicare Other | Admitting: Hematology

## 2019-08-05 ENCOUNTER — Ambulatory Visit (HOSPITAL_COMMUNITY)
Admission: RE | Admit: 2019-08-05 | Discharge: 2019-08-05 | Disposition: A | Discharge status: Home / Self Care | Payer: Medicare Other | Source: Ambulatory Visit | Attending: Family Medicine | Admitting: Family Medicine

## 2019-08-05 ENCOUNTER — Other Ambulatory Visit: Payer: Self-pay

## 2019-08-05 DIAGNOSIS — R7989 Other specified abnormal findings of blood chemistry: Secondary | ICD-10-CM | POA: Insufficient documentation

## 2019-08-05 DIAGNOSIS — E042 Nontoxic multinodular goiter: Secondary | ICD-10-CM | POA: Diagnosis not present

## 2019-08-13 ENCOUNTER — Telehealth: Payer: Self-pay | Admitting: Family Medicine

## 2019-08-13 NOTE — Telephone Encounter (Signed)
Patient aware of ultrasound results with verbal understanding.

## 2019-08-13 NOTE — Telephone Encounter (Signed)
Pt is returning your call

## 2019-08-17 ENCOUNTER — Other Ambulatory Visit: Payer: Self-pay

## 2019-08-17 MED ORDER — METFORMIN HCL ER 500 MG PO TB24: 500.0000 mg | ORAL_TABLET | Freq: Two times a day (BID) | ORAL | 1 refills | Status: DC

## 2019-08-17 MED ORDER — BD PEN NEEDLE MICRO U/F 32G X 6 MM MISC: 3 refills | Status: DC

## 2019-08-19 ENCOUNTER — Ambulatory Visit (HOSPITAL_COMMUNITY)
Admission: RE | Admit: 2019-08-19 | Discharge: 2019-08-19 | Disposition: A | Discharge status: Home / Self Care | Payer: Medicare Other | Source: Ambulatory Visit | Attending: Family Medicine | Admitting: Family Medicine

## 2019-08-19 ENCOUNTER — Other Ambulatory Visit: Payer: Self-pay

## 2019-08-19 ENCOUNTER — Encounter (HOSPITAL_COMMUNITY): Payer: Self-pay

## 2019-08-19 DIAGNOSIS — Z1231 Encounter for screening mammogram for malignant neoplasm of breast: Secondary | ICD-10-CM | POA: Diagnosis not present

## 2019-08-20 ENCOUNTER — Inpatient Hospital Stay (HOSPITAL_BASED_OUTPATIENT_CLINIC_OR_DEPARTMENT_OTHER): Payer: Medicare Other | Admitting: Hematology

## 2019-08-20 ENCOUNTER — Encounter (HOSPITAL_COMMUNITY): Payer: Self-pay | Admitting: Hematology

## 2019-08-20 DIAGNOSIS — D500 Iron deficiency anemia secondary to blood loss (chronic): Secondary | ICD-10-CM

## 2019-08-20 DIAGNOSIS — D631 Anemia in chronic kidney disease: Secondary | ICD-10-CM | POA: Diagnosis not present

## 2019-08-20 DIAGNOSIS — N189 Chronic kidney disease, unspecified: Secondary | ICD-10-CM | POA: Diagnosis not present

## 2019-08-20 NOTE — Assessment & Plan Note (Signed)
1.  Normocytic anemia: -Previously thought to be from chronic blood loss.  There is also component of CKD.  - EGD on 07/18/2017 shows gastritis, duodenitis with no H. pylori. -Colonoscopy on 07/18/2017 shows internal and external hemorrhoids.  -Denies any bleeding per rectum or melena.  No B symptoms or recent hospitalizations.  -Hemoglobin is stable at 11.3, ferritin is stable at 76, iron 63 saturation 18%.  No indication for parenteral iron at this time. -We will continue to monitor labs.  Patient return to clinic in 6 months or sooner if needed.

## 2019-08-20 NOTE — Patient Instructions (Signed)
[facility]  Discharge Instructions:  You saw Renee Nester, NP, today. _______________________________________________________________  Thank you for choosing [facility] to provide your oncology and hematology care.  To afford each patient quality time with our providers, please arrive at least 15 minutes before your scheduled appointment.  You need to re-schedule your appointment if you arrive 10 or more minutes late.  We strive to give you quality time with our providers, and arriving late affects you and other patients whose appointments are after yours.  Also, if you no show three or more times for appointments you may be dismissed from the clinic.  Again, thank you for choosing [facility]. Our hope is that these requests will allow you access to exceptional care and in a timely manner. _______________________________________________________________  If you have questions after your visit, please contact our office at [phone] between the hours of 8:30 a.m. and 5:00 p.m. Voicemails left after 4:30 p.m. will not be returned until the following business day. _______________________________________________________________  For prescription refill requests, have your pharmacy contact our office. _______________________________________________________________  Recommendations made by the consultant and any test results will be sent to your referring physician. _______________________________________________________________ 

## 2019-08-20 NOTE — Progress Notes (Signed)
Kimberly Freeman, [place] [id number]   CLINIC:  Medical Oncology/Hematology  PCP:  Fayrene Helper, MD [address] [place] [address] [phone]   REASON FOR VISIT:  Follow-up for anemia  CURRENT THERAPY: Clinical surveillance    INTERVAL HISTORY:  Kimberly Freeman 78 y.o. female presents today for follow-up.  She reports overall doing well she denies any significant fatigue.  She denies any shortness of breath, chest pain, lightheadedness or dizziness.  She denies any obvious signs of bleeding.  No change in bowel habits.  Appetite is stable.  No weight loss.  Denies any fevers, chills, night sweats.  She is here for repeat labs and office visit.   REVIEW OF SYSTEMS:  Review of Systems  Musculoskeletal: Positive for arthralgias.  All other systems reviewed and are negative.    PAST MEDICAL/SURGICAL HISTORY:  Past Medical History:  Diagnosis Date  . Arthritis   . Diabetes mellitus, type 2 ([place])   . Essential hypertension   . GERD (gastroesophageal reflux disease)   . Hyperlipidemia    Past Surgical History:  Procedure Laterality Date  . ABDOMINAL HYSTERECTOMY    . BIOPSY  07/18/2017   Procedure: BIOPSY;  Surgeon: Danie Binder, MD;  Location: [facility];  Service: Endoscopy;;  gastric  . CHOLECYSTECTOMY  2004  . COLONOSCOPY N/A 07/18/2017   Dr. Oneida Alar: Internal and external hemorrhoids.  No future screening/surveillance colonoscopies due to age.  . ESOPHAGOGASTRODUODENOSCOPY N/A 07/18/2017   Dr. Oneida Alar: Small hiatal hernia, patchy mild inflammation characterized by congestion, erosions, erythema in the cardia, gastric body and antrum.  Diffuse moderate inflammation in the duodenal bulb and second portion duodenum.  Biopsies benign.  No H. pylori.     SOCIAL HISTORY:  Social History   Socioeconomic History  . Marital status: Married    Spouse name: Kimberly Freeman   . Number of children: 0  . Years of education:  12+  . Highest education level: 12th grade  Occupational History  . Occupation: retired   Scientific laboratory technician  . Financial resource strain: Not hard at all  . Food insecurity    Worry: Never true    Inability: Never true  . Transportation needs    Medical: No    Non-medical: No  Tobacco Use  . Smoking status: Never Smoker  . Smokeless tobacco: Never Used  Substance and Sexual Activity  . Alcohol use: No    Alcohol/week: 0.0 standard drinks  . Drug use: No  . Sexual activity: Not Currently    Partners: Male  Lifestyle  . Physical activity    Days per week: 4 days    Minutes per session: 40 min  . Stress: Not at all  Relationships  . Social connections    Talks on phone: More than three times a week    Gets together: Once a week    Attends religious service: 1 to 4 times per year    Active member of club or organization: Yes    Attends meetings of clubs or organizations: More than 4 times per year    Relationship status: Married  . Intimate partner violence    Fear of current or ex partner: No    Emotionally abused: No    Physically abused: No    Forced sexual activity: No  Other Topics Concern  . Not on file  Social History Narrative   Lives alone with husband     FAMILY HISTORY:  Family History  Problem Relation Age of Onset  . Leukemia Mother   . Cancer Mother   . Prostate cancer Father   . Lung cancer Sister [phone]  . Diabetes Sister   . Colon cancer Neg Hx     CURRENT MEDICATIONS:  Outpatient Encounter Medications as of 08/20/2019  Medication Sig  . polyethylene glycol (MIRALAX / GLYCOLAX) 17 g packet Take 17 g by mouth daily as needed.  Marland Kitchen. acetaminophen (TYLENOL) 500 MG tablet Take 500 mg by mouth daily as needed for mild pain or moderate pain. For pain   . amLODipine (NORVASC) 2.5 MG tablet Take 1 tablet (2.5 mg total) by mouth daily.  Marland Kitchen. aspirin 325 MG tablet Take 325 mg by mouth every morning.  Marland Kitchen. atorvastatin (LIPITOR) 40 MG tablet TAKE 1 TABLET BY MOUTH AT  BEDTIME  . calcium-vitamin D (OSCAL WITH D) 500-200 MG-UNIT tablet Take 1 tablet by mouth.  Marland Kitchen. glipiZIDE (GLUCOTROL XL) 5 MG 24 hr tablet Take 1 tablet (5 mg total) by mouth daily.  Marland Kitchen. glucose blood (ONE TOUCH ULTRA TEST) test strip USE AS INSTRUCTED FOR TWICE DAILY TESTING  . Insulin Glargine (BASAGLAR KWIKPEN) 100 UNIT/ML SOPN INJECT 50 UNITS SUBCUTANEOUSLY AT BEDTIME (DISCONTINUE LEVEMIR)  . Insulin Pen Needle (BD PEN NEEDLE MICRO U/F) 32G X 6 MM MISC USE TO INJECT INSULIN DAILY  . irbesartan (AVAPRO) 300 MG tablet Take 1 tablet (300 mg total) by mouth daily.  . Lancets (ONETOUCH ULTRASOFT) lancets USE AS DIRECTED TWICE DAILY  . metoprolol tartrate (LOPRESSOR) 50 MG tablet TAKE ONE AND ONE-HALF (1 & 1/2) TABLETS BY MOUTH TWICE DAILY  . Multiple Vitamin (MULTIVITAMIN WITH MINERALS) TABS tablet Take 1 tablet by mouth daily.  . pantoprazole (PROTONIX) 40 MG tablet TAKE 1 TABLET BY MOUTH 30 MINUTES BEFORE FIRST MEAL  . [DISCONTINUED] metFORMIN (GLUCOPHAGE-XR) 500 MG 24 hr tablet Take 1 tablet (500 mg total) by mouth 2 (two) times daily. (Patient not taking: Reported on 08/20/2019)   No facility-administered encounter medications on file as of 08/20/2019.     ALLERGIES:  Allergies  Allergen Reactions  . Poison Oak Extract [Poison Oak Extract] Hives  . Sulfonamide Derivatives Itching    Burning sensation all over body     PHYSICAL EXAM:  ECOG Performance status: 1  Vitals:   08/20/19 1110  BP: 127/63  Pulse: 62  Resp: 18  Temp: (!) 97.2 F (36.2 C)  SpO2: 98%   Filed Weights   08/20/19 1110  Weight: 198 lb 12.8 oz (90.2 kg)    Physical Exam Constitutional:      Appearance: Normal appearance.  HENT:     Head: Normocephalic.     Right Ear: External ear normal.     Left Ear: External ear normal.     Nose: Nose normal.     Mouth/Throat:     Pharynx: Oropharynx is clear.  Eyes:     Conjunctiva/sclera: Conjunctivae normal.  Neck:     Musculoskeletal: Normal range of  motion.  Cardiovascular:     Rate and Rhythm: Normal rate and regular rhythm.     Pulses: Normal pulses.     Heart sounds: Normal heart sounds.  Pulmonary:     Effort: Pulmonary effort is normal.     Breath sounds: Normal breath sounds.  Abdominal:     General: Bowel sounds are normal.  Musculoskeletal: Normal range of motion.     Comments: Decreased ROM   Skin:    General: Skin is warm.  Neurological:  General: No focal deficit present.     Mental Status: She is alert and oriented to person, place, and time. Mental status is at baseline.  Psychiatric:        Mood and Affect: Mood normal.        Behavior: Behavior normal.        Thought Content: Thought content normal.        Judgment: Judgment normal.      LABORATORY DATA:  I have reviewed the labs as listed.  CBC    Component Value Date/Time   WBC 6.6 07/28/2019 1257   RBC 3.73 (L) 07/28/2019 1257   HGB 11.3 (L) 07/28/2019 1257   HCT 35.8 (L) 07/28/2019 1257   PLT 220 07/28/2019 1257   MCV 96.0 07/28/2019 1257   MCH 30.3 07/28/2019 1257   MCHC 31.6 07/28/2019 1257   RDW 12.6 07/28/2019 1257   LYMPHSABS 2.4 07/28/2019 1257   MONOABS 0.6 07/28/2019 1257   EOSABS 0.4 07/28/2019 1257   BASOSABS 0.0 07/28/2019 1257   CMP Latest Ref Rng & Units 07/28/2019 07/24/2019 03/02/2019  Glucose 70 - 99 mg/dL 468(E) 321(Y) 248(G)  BUN 8 - 23 mg/dL 18 21 23   Creatinine 0.44 - 1.00 mg/dL ) 5.00(B) 7.04(U)  Sodium 135 - 145 mmol/L 139 142 141  Potassium 3.5 - 5.1 mmol/L 4.7 4.3 4.1  Chloride 98 - 111 mmol/L 103 106 108  CO2 22 - 32 mmol/L 30 30 27   Calcium 8.9 - 10.3 mg/dL 9.4 9.7 9.8  Total Protein 6.5 - 8.1 g/dL 6.9 - 6.7  Total Bilirubin 0.3 - 1.2 mg/dL 0.4 - 0.4  Alkaline Phos 38 - 126 U/L 72 - -  AST 15 - 41 U/L 24 - 18  ALT 0 - 44 U/L 26 - 19        ASSESSMENT & PLAN:   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  Normocytic anemia: -Previously thought to be from chronic blood loss.  There is also component  of CKD.  - EGD on 07/18/2017 shows gastritis, duodenitis with no H. pylori. -Colonoscopy on 07/18/2017 shows internal and external hemorrhoids.  -Denies any bleeding per rectum or melena.  No B symptoms or recent hospitalizations.  -Hemoglobin is stable at 11.3, ferritin is stable at 76, iron 63 saturation 18%.  No indication for parenteral iron at this time. -We will continue to monitor labs.  Patient return to clinic in 6 months or sooner if needed.        07/20/2017, FNP  [facility] [phone]

## 2019-08-21 DIAGNOSIS — B351 Tinea unguium: Secondary | ICD-10-CM | POA: Diagnosis not present

## 2019-08-21 DIAGNOSIS — E1142 Type 2 diabetes mellitus with diabetic polyneuropathy: Secondary | ICD-10-CM | POA: Diagnosis not present

## 2019-08-23 DIAGNOSIS — I2510 Atherosclerotic heart disease of native coronary artery without angina pectoris: Secondary | ICD-10-CM

## 2019-08-23 HISTORY — DX: Atherosclerotic heart disease of native coronary artery without angina pectoris: I25.10

## 2019-09-03 ENCOUNTER — Ambulatory Visit: Payer: Medicare Other | Admitting: Family Medicine

## 2019-09-20 ENCOUNTER — Emergency Department (HOSPITAL_COMMUNITY): Payer: Medicare Other

## 2019-09-20 ENCOUNTER — Inpatient Hospital Stay (HOSPITAL_COMMUNITY)
Admission: EM | Admit: 2019-09-20 | Discharge: 2019-09-22 | DRG: 287 | Disposition: A | Discharge status: Home / Self Care | Payer: Medicare Other | Attending: Family Medicine | Admitting: Family Medicine

## 2019-09-20 ENCOUNTER — Encounter (HOSPITAL_COMMUNITY): Payer: Self-pay

## 2019-09-20 ENCOUNTER — Other Ambulatory Visit: Payer: Self-pay

## 2019-09-20 DIAGNOSIS — I249 Acute ischemic heart disease, unspecified: Secondary | ICD-10-CM | POA: Diagnosis not present

## 2019-09-20 DIAGNOSIS — Z20828 Contact with and (suspected) exposure to other viral communicable diseases: Secondary | ICD-10-CM | POA: Diagnosis present

## 2019-09-20 DIAGNOSIS — R079 Chest pain, unspecified: Secondary | ICD-10-CM | POA: Diagnosis not present

## 2019-09-20 DIAGNOSIS — E11649 Type 2 diabetes mellitus with hypoglycemia without coma: Secondary | ICD-10-CM | POA: Diagnosis not present

## 2019-09-20 DIAGNOSIS — I25110 Atherosclerotic heart disease of native coronary artery with unstable angina pectoris: Principal | ICD-10-CM | POA: Diagnosis present

## 2019-09-20 DIAGNOSIS — I471 Supraventricular tachycardia: Secondary | ICD-10-CM | POA: Diagnosis present

## 2019-09-20 DIAGNOSIS — I200 Unstable angina: Secondary | ICD-10-CM

## 2019-09-20 DIAGNOSIS — I129 Hypertensive chronic kidney disease with stage 1 through stage 4 chronic kidney disease, or unspecified chronic kidney disease: Secondary | ICD-10-CM | POA: Diagnosis not present

## 2019-09-20 DIAGNOSIS — N1832 Chronic kidney disease, stage 3b: Secondary | ICD-10-CM | POA: Diagnosis present

## 2019-09-20 DIAGNOSIS — Z90710 Acquired absence of both cervix and uterus: Secondary | ICD-10-CM

## 2019-09-20 DIAGNOSIS — I480 Paroxysmal atrial fibrillation: Secondary | ICD-10-CM | POA: Diagnosis present

## 2019-09-20 DIAGNOSIS — E785 Hyperlipidemia, unspecified: Secondary | ICD-10-CM | POA: Diagnosis present

## 2019-09-20 DIAGNOSIS — I10 Essential (primary) hypertension: Secondary | ICD-10-CM | POA: Diagnosis present

## 2019-09-20 DIAGNOSIS — K5900 Constipation, unspecified: Secondary | ICD-10-CM | POA: Diagnosis present

## 2019-09-20 DIAGNOSIS — Z794 Long term (current) use of insulin: Secondary | ICD-10-CM

## 2019-09-20 DIAGNOSIS — E1122 Type 2 diabetes mellitus with diabetic chronic kidney disease: Secondary | ICD-10-CM | POA: Diagnosis not present

## 2019-09-20 DIAGNOSIS — E782 Mixed hyperlipidemia: Secondary | ICD-10-CM | POA: Diagnosis present

## 2019-09-20 DIAGNOSIS — Z7982 Long term (current) use of aspirin: Secondary | ICD-10-CM

## 2019-09-20 DIAGNOSIS — Z9049 Acquired absence of other specified parts of digestive tract: Secondary | ICD-10-CM

## 2019-09-20 DIAGNOSIS — K219 Gastro-esophageal reflux disease without esophagitis: Secondary | ICD-10-CM | POA: Diagnosis present

## 2019-09-20 DIAGNOSIS — Z833 Family history of diabetes mellitus: Secondary | ICD-10-CM

## 2019-09-20 DIAGNOSIS — Z79899 Other long term (current) drug therapy: Secondary | ICD-10-CM

## 2019-09-20 DIAGNOSIS — E1165 Type 2 diabetes mellitus with hyperglycemia: Secondary | ICD-10-CM | POA: Diagnosis present

## 2019-09-20 LAB — BASIC METABOLIC PANEL
Anion gap: 9 (ref 5–15)
BUN: 21 mg/dL (ref 8–23)
CO2: 24 mmol/L (ref 22–32)
Calcium: 9 mg/dL (ref 8.9–10.3)
Chloride: 107 mmol/L (ref 98–111)
Creatinine, Ser: 1.42 mg/dL — ABNORMAL HIGH (ref 0.44–1.00)
GFR calc Af Amer: 40 mL/min — ABNORMAL LOW (ref >60)
GFR calc non Af Amer: 35 mL/min — ABNORMAL LOW (ref >60)
Glucose, Bld: 342 mg/dL — ABNORMAL HIGH (ref 70–99)
Potassium: 3.9 mmol/L (ref 3.5–5.1)
Sodium: 140 mmol/L (ref 135–145)

## 2019-09-20 LAB — CBC
HCT: 37.7 % (ref 36.0–46.0)
Hemoglobin: 11.9 g/dL — ABNORMAL LOW (ref 12.0–15.0)
MCH: 30.4 pg (ref 26.0–34.0)
MCHC: 31.6 g/dL (ref 30.0–36.0)
MCV: 96.4 fL (ref 80.0–100.0)
Platelets: 198 10*3/uL (ref 150–400)
RBC: 3.91 MIL/uL (ref 3.87–5.11)
RDW: 12.6 % (ref 11.5–15.5)
WBC: 6.6 10*3/uL (ref 4.0–10.5)
nRBC: 0 % (ref 0.0–0.2)

## 2019-09-20 LAB — TROPONIN I (HIGH SENSITIVITY)
Troponin I (High Sensitivity): 9 ng/L (ref <18)
Troponin I (High Sensitivity): 23 ng/L — ABNORMAL HIGH (ref <18)

## 2019-09-20 MED ORDER — NITROGLYCERIN 0.4 MG SL SUBL: 0.4000 mg | SUBLINGUAL_TABLET | SUBLINGUAL | Status: DC | PRN

## 2019-09-20 MED ORDER — INSULIN ASPART 100 UNIT/ML ~~LOC~~ SOLN: 0.0000 [IU] | Freq: Every day | SUBCUTANEOUS | Status: DC

## 2019-09-20 MED ORDER — INSULIN ASPART 100 UNIT/ML ~~LOC~~ SOLN
0.0000 [IU] | Freq: Three times a day (TID) | SUBCUTANEOUS | Status: DC
  Administered 2019-09-22: 1 [IU] via SUBCUTANEOUS

## 2019-09-20 MED ORDER — ALPRAZOLAM 0.25 MG PO TABS: 0.2500 mg | ORAL_TABLET | Freq: Two times a day (BID) | ORAL | Status: DC | PRN

## 2019-09-20 MED ORDER — ASPIRIN 300 MG RE SUPP: 300.0000 mg | RECTAL | Status: AC

## 2019-09-20 MED ORDER — ACETAMINOPHEN 650 MG RE SUPP: 650.0000 mg | Freq: Four times a day (QID) | RECTAL | Status: DC | PRN

## 2019-09-20 MED ORDER — ACETAMINOPHEN 325 MG PO TABS: 650.0000 mg | ORAL_TABLET | Freq: Four times a day (QID) | ORAL | Status: DC | PRN

## 2019-09-20 MED ORDER — SODIUM CHLORIDE 0.9% FLUSH: 3.0000 mL | Freq: Once | INTRAVENOUS | Status: DC

## 2019-09-20 MED ORDER — ASPIRIN 81 MG PO CHEW
324.0000 mg | CHEWABLE_TABLET | ORAL | Status: AC
  Administered 2019-09-21: 324 mg via ORAL
  Filled 2019-09-20: qty 4

## 2019-09-20 MED ORDER — HEPARIN BOLUS VIA INFUSION
4000.0000 [IU] | Freq: Once | INTRAVENOUS | Status: AC
  Administered 2019-09-20: 4000 [IU] via INTRAVENOUS

## 2019-09-20 MED ORDER — HEPARIN (PORCINE) 25000 UT/250ML-% IV SOLN
1000.0000 [IU]/h | INTRAVENOUS | Status: DC
  Administered 2019-09-20: 20:00:00 1000 [IU]/h via INTRAVENOUS
  Filled 2019-09-20: qty 250

## 2019-09-20 MED ORDER — ASPIRIN EC 81 MG PO TBEC
81.0000 mg | DELAYED_RELEASE_TABLET | Freq: Every day | ORAL | Status: DC
  Administered 2019-09-21: 81 mg via ORAL
  Filled 2019-09-20: qty 1

## 2019-09-20 MED ORDER — ONDANSETRON HCL 4 MG/2ML IJ SOLN: 4.0000 mg | Freq: Four times a day (QID) | INTRAMUSCULAR | Status: DC | PRN

## 2019-09-20 NOTE — Progress Notes (Signed)
ANTICOAGULATION CONSULT NOTE - Initial Consult  Pharmacy Consult for heparin gtt  Indication: chest pain/ACS  Allergies  Allergen Reactions  . Poison Oak Extract [Poison Oak Extract] Hives  . Sulfonamide Derivatives Itching    Burning sensation all over body    Patient Measurements: Height: 5\' 7"  (170.2 cm) Weight: 180 lb (81.6 kg) IBW/kg (Calculated) : 61.6 Heparin Dosing Weight: HEPARIN DW (KG): 78.4   Vital Signs: Temp: 97.8 F (36.6 C) (11/29 1952) Temp Source: Tympanic (11/29 1952) BP: 186/78 (11/29 2004) Pulse Rate: 76 (11/29 2018)  Labs: No results for input(s): HGB, HCT, PLT, APTT, LABPROT, INR, HEPARINUNFRC, HEPRLOWMOCWT, CREATININE, CKTOTAL, CKMB, TROPONINI in the last 72 hours.  CrCl cannot be calculated (Patient's most recent lab result is older than the maximum 21 days allowed.).   Medical History: Past Medical History:  Diagnosis Date  . Arthritis   . Diabetes mellitus, type 2 (Kimberly Freeman)   . Essential hypertension   . GERD (gastroesophageal reflux disease)   . Hyperlipidemia     Medications:  (Not in a hospital admission)  Scheduled:  . heparin  4,000 Units Intravenous Once  . sodium chloride flush  3 mL Intravenous Once   Infusions:  . heparin     PRN: nitroGLYCERIN Anti-infectives (From admission, onward)   None      Assessment: Kimberly Freeman a 78 y.o. female requires anticoagulation with a heparin iv infusion for the indication of  chest pain/ACS. Heparin gtt will be started following pharmacy protocol per pharmacy consult. Patient is not on previous oral anticoagulant that will require aPTT/HL correlation before transitioning to only HL monitoring.   Goal of Therapy:  Heparin level 0.3-0.7 units/ml Monitor platelets by anticoagulation protocol: Yes   Plan:  Give 4000 units bolus x 1 Start heparin infusion at 1000 units/hr Check anti-Xa level in 8 hours and daily while on heparin Continue to monitor H&H and platelets  Heparin level  to be drawn in 8 hours for patients >39 years old  Donna Christen Louann Hopson 09/20/2019,8:22 PM

## 2019-09-20 NOTE — ED Triage Notes (Signed)
Pt presents to ED with complaints of mid chest pain started 45 minutes ago. Pt states she has felt sweaty with the pain. Pt states the pain feels like heavy pressure.

## 2019-09-20 NOTE — ED Provider Notes (Signed)
[facility] EMERGENCY DEPARTMENT Provider Note   CSN: [id number] Arrival date & time: 09/20/19  1940     History   Chief Complaint Chief Complaint  Patient presents with  . Chest Pain    HPI Kimberly Freeman is a 78 y.o. female.     HPI   78 year old female with chest pain.  Onset while at rest approximately 45 minutes prior to arrival.  Describes a deep pressure in the center of her chest.  Associated with diaphoresis.  No nausea.  Mild dyspnea.  Pain did not radiate.  Pain has improved since onset although not completely resolved at this point.  She is a past history of atrial fibrillation.  She is only on 325 mg of aspirin which she took a dose of earlier today.  No known CAD that she is aware of.  No fevers or chills.  No cough.  No unusual leg pain or swelling.  Past Medical History:  Diagnosis Date  . Arthritis   . Diabetes mellitus, type 2 ([facility])   . Essential hypertension   . GERD (gastroesophageal reflux disease)   . Hyperlipidemia     Patient Active Problem List   Diagnosis Date Noted  . Abnormal TSH 07/27/2019  . Educated about COVID-19 virus infection 03/08/2019  . Arthritis of left knee 04/03/2018  . Normocytic anemia due to blood loss   . History of colonic polyps 06/03/2017  . Thoracic spine pain 03/25/2017  . Anemia 01/12/2017  . Esophageal reflux   . Ganglion cyst of wrist 12/05/2015  . Overweight (BMI 25.0-29.9) 04/22/2015  . Nonspecific abnormal electrocardiogram (ECG) (EKG) 10/18/2014  . Stress at home 10/25/2013  . Chronic right shoulder pain 10/04/2011  . Type 2 diabetes mellitus with diabetic chronic kidney disease (HCC) 04/21/2008  . Hyperlipidemia LDL goal <100 04/21/2008  . Obese 04/21/2008  . Malignant hypertension 04/21/2008    Past Surgical History:  Procedure Laterality Date  . ABDOMINAL HYSTERECTOMY    . BIOPSY  07/18/2017   Procedure: BIOPSY;  Surgeon: [name], MD;  Location: [facility];  Service: Endoscopy;;  gastric   . CHOLECYSTECTOMY  2004  . COLONOSCOPY N/A 07/18/2017   Dr. [name]: Internal and external hemorrhoids.  No future screening/surveillance colonoscopies due to age.  . ESOPHAGOGASTRODUODENOSCOPY N/A 07/18/2017   Dr. [name]: Small hiatal hernia, patchy mild inflammation characterized by congestion, erosions, erythema in the cardia, gastric body and antrum.  Diffuse moderate inflammation in the duodenal bulb and second portion duodenum.  Biopsies benign.  No H. pylori.     OB History   No obstetric history on file.      Home Medications    Prior to Admission medications   Medication Sig Start Date End Date Taking? Authorizing Provider  glipiZIDE (GLUCOTROL XL) 5 MG 24 hr tablet Take 1 tablet (5 mg total) by mouth daily. 03/17/19  Yes [name], MD  acetaminophen (TYLENOL) 500 MG tablet Take 500 mg by mouth daily as needed for mild pain or moderate pain. For pain     [provider]  amLODipine (NORVASC) 2.5 MG tablet Take 1 tablet (2.5 mg total) by mouth daily. 07/27/19   [name], MD  aspirin 325 MG tablet Take 325 mg by mouth every morning.    [provider]  atorvastatin (LIPITOR) 40 MG tablet TAKE 1 TABLET BY MOUTH AT BEDTIME 12/22/18   [name], MD  calcium-vitamin D (OSCAL WITH D) 500-200 MG-UNIT tablet Take 1 tablet by  mouth.    [provider]  glucose blood (ONE TOUCH ULTRA TEST) test strip USE AS INSTRUCTED FOR TWICE DAILY TESTING 08/29/18   [name], MD  Insulin Glargine [facility] KWIKPEN) 100 UNIT/ML SOPN INJECT 50 UNITS SUBCUTANEOUSLY AT BEDTIME (DISCONTINUE LEVEMIR) 08/29/18   [name], MD  Insulin Pen Needle (BD PEN NEEDLE MICRO U/F) 32G X 6 MM MISC USE TO INJECT INSULIN DAILY 08/17/19   [name], MD  irbesartan (AVAPRO) 300 MG tablet Take 1 tablet (300 mg total) by mouth daily. 02/16/19   [name], MD  Lancets [facility] ULTRASOFT) lancets USE AS DIRECTED TWICE DAILY 08/29/18    [name], MD  metoprolol tartrate (LOPRESSOR) 50 MG tablet TAKE ONE AND ONE-HALF (1 & 1/2) TABLETS BY MOUTH TWICE DAILY 07/22/19   [name], MD  Multiple Vitamin (MULTIVITAMIN WITH MINERALS) TABS tablet Take 1 tablet by mouth daily.    [provider]  pantoprazole (PROTONIX) 40 MG tablet TAKE 1 TABLET BY MOUTH 30 MINUTES BEFORE FIRST MEAL 07/22/19   Tiffany Kocher, PA-C  polyethylene glycol (MIRALAX / GLYCOLAX) 17 g packet Take 17 g by mouth daily as needed.    [provider]    Family History Family History  Problem Relation Age of Onset  . Leukemia Mother   . Cancer Mother   . Prostate cancer Father   . Lung cancer Sister 68  . Diabetes Sister   . Colon cancer Neg Hx     Social History Social History   Tobacco Use  . Smoking status: Never Smoker  . Smokeless tobacco: Never Used  Substance Use Topics  . Alcohol use: No    Alcohol/week: 0.0 standard drinks  . Drug use: No     Allergies   Poison oak extract [poison oak extract] and Sulfonamide derivatives   Review of Systems Review of Systems  All systems reviewed and negative, other than as noted in HPI.  Physical Exam Updated Vital Signs BP (!) 186/78   Pulse 76   Temp 97.8 F (36.6 C) (Tympanic)   Resp (!) 21   Ht  (1.702 m)   Wt 81.6 kg   SpO2 97%   BMI 28.19 kg/m   Physical Exam Vitals signs and nursing note reviewed.  Constitutional:      General: She is not in acute distress.    Appearance: She is well-developed.  HENT:     Head: Normocephalic and atraumatic.  Eyes:     General:        Right eye: No discharge.        Left eye: No discharge.     Conjunctiva/sclera: Conjunctivae normal.  Neck:     Musculoskeletal: Neck supple.  Cardiovascular:     Rate and Rhythm: Normal rate and regular rhythm.     Heart sounds: Normal heart sounds. No murmur. No friction rub. No gallop.   Pulmonary:     Effort: Pulmonary effort is normal. No respiratory  distress.     Breath sounds: Normal breath sounds.  Abdominal:     General: There is no distension.     Palpations: Abdomen is soft.     Tenderness: There is no abdominal tenderness.  Musculoskeletal:        General: No tenderness.  Skin:    General: Skin is warm and dry.  Neurological:     Mental Status: She is alert.  Psychiatric:        Behavior: Behavior  normal.        Thought Content: Thought content normal.      ED Treatments / Results  Labs (all labs ordered are listed, but only abnormal results are displayed) Labs Reviewed  BASIC METABOLIC PANEL - Abnormal; Notable for the following components:      Result Value   Glucose, Bld 342 (*)    Creatinine, Ser 1.42 (*)    GFR calc non Af Amer 35 (*)    GFR calc Af Amer 40 (*)    All other components within normal limits  CBC - Abnormal; Notable for the following components:   Hemoglobin 11.9 (*)    All other components within normal limits  TROPONIN I (HIGH SENSITIVITY) - Abnormal; Notable for the following components:   Troponin I (High Sensitivity) 23 (*)    All other components within normal limits  SARS CORONAVIRUS 2 (TAT 6-24 HRS)  CBC  HEPARIN LEVEL (UNFRACTIONATED)  TROPONIN I (HIGH SENSITIVITY)    EKG EKG Interpretation  Date/Time:  "Sunday September 20 2019 19:49:37 EST Ventricular Rate:  77 PR Interval:  174 QRS Duration: 96 QT Interval:  382 QTC Calculation: 432 R Axis:   38 Text Interpretation: Normal sinus rhythm ST & T wave abnormality, consider inferolateral ischemia Abnormal ECG Confirmed by Tiney Zipper ([id number]) on 09/20/2019 8:01:33 PM   Radiology Dg Chest 2 View  Result Date: 09/20/2019 CLINICAL DATA:  Mid chest pain EXAM: CHEST - 2 VIEW COMPARISON:  03/25/2017 FINDINGS: Heart and mediastinal contours are within normal limits. No focal opacities or effusions. No acute bony abnormality. IMPRESSION: No active cardiopulmonary disease. Electronically Signed   By: Kevin  Dover M.D.   On:  09/20/2019 21:02    Procedures Procedures (including critical care time)  CRITICAL CARE Performed by: Caylah Plouff Total critical care time: 35 minutes Critical care time was exclusive of separately billable procedures and treating other patients. Critical care was necessary to treat or prevent imminent or life-threatening deterioration. Critical care was time spent personally by me on the following activities: development of treatment plan with patient and/or surrogate as well as nursing, discussions with consultants, evaluation of patient's response to treatment, examination of patient, obtaining history from patient or surrogate, ordering and performing treatments and interventions, ordering and review of laboratory studies, ordering and review of radiographic studies, pulse oximetry and re-evaluation of patient's condition.   Medications Ordered in ED Medications  sodium chloride flush (NS) 0.9 % injection 3 mL (3 mLs Intravenous Not Given 09/20/19 2012)  nitroGLYCERIN (NITROSTAT) SL tablet 0.4 mg (has no administration in time range)  heparin bolus via infusion 4,000 Units (has no administration in time range)  heparin ADULT infusion 100 units/mL (25000 units/250mL sodium chloride 0.45%) (has no administration in time range)     Initial Impression / Assessment and Plan / ED Course  I have reviewed the triage vital signs and the nursing notes.  Pertinent labs & imaging results that were available during my care of the patient were reviewed by me and considered in my medical decision making (see chart for details).        78"  year old female with chest pain.  Concerning features for possible ACS.  EKG is changed from priors.  Pain improving since onset but still lingering.  Try nitro.  Had aspirin earlier today.  Is concerning for ACS that we will go and start heparin.  Labs, Covid screen and chest x-ray.   Discussed case with cardiology.  Repeat troponin resulted subsequent to  this  conversation. It is just minimally elevated.  She has been pain-free since nitro was started.  [facility]. There is no indication for emergent intervention currently.  The plan at this point will be to continue heparin overnight and trend enzymes.  Cardiology consultation in the morning by the team here. Will admit to hospitalist service.   Final Clinical Impressions(s) / ED Diagnoses   Final diagnoses:  Unstable angina [facility](HCC)    ED Discharge Orders    None       Raeford RazorKohut, Kimla Furth, MD 09/22/19 1050

## 2019-09-20 NOTE — H&P (Signed)
History and Physical    Kimberly Freeman TMH:[id number] DOB: [birth year] DOA: 09/20/2019  PCP: [name], MD   Patient coming from: Home.  I have personally briefly reviewed patient's old medical records in [facility]  Chief Complaint: Chest pain.  HPI: Kimberly Freeman is a 78 y.o. female with medical history significant of osteoarthritis, type 2 diabetes, hypertension, GERD, hyperlipidemia, remote history of atrial fibrillation about 20 years ago off of anticoagulation for a number of years now who is coming to the emergency department with complaints of precordial chest pain, pressure-like, nonradiated, associated with dyspnea, palpitations and diaphoresis.  She denies dizziness, nausea or emesis, but states that she has had some mild lower extremity edema.  She denies fever, chills, sore throat or rhinorrhea.  No wheezing or productive cough.  Denies abdominal pain, diarrhea,  melena or hematochezia.  She has occasional constipation.  No dysuria, frequency or hematuria.  No polyuria, polydipsia, polyphagia or blurred vision.  ED Course: Initial vital signs temperature 97.8 F, pulse 77, respiration 18, blood pressure 181/88 mmHg and O2 sat 96% on room air.  The patient had EKG changes and cardiology at [facility] was consulted.  They had no beds and recommended admission with cardiology consult at this facility.  She was started on an insulin infusion.  Her CBC showed a hemoglobin 11.9 g/dL, but was otherwise normal.  Troponin was 9 then 23 ng/L.  Her BMP showed a glucose of 342 and a creatinine of 1.42 mg/dL.  BUN and her electrolytes were normal.  Her chest radiograph was within normal limits.  Review of Systems: As per HPI otherwise 10 point review of systems negative.   Past Medical History:  Diagnosis Date  . Arthritis   . Diabetes mellitus, type 2 (HCC)   . Essential hypertension   . GERD (gastroesophageal reflux disease)   . Hyperlipidemia     Past Surgical History:   Procedure Laterality Date  . ABDOMINAL HYSTERECTOMY    . BIOPSY  07/18/2017   Procedure: BIOPSY;  Surgeon: [name], MD;  [facility];  Service: Endoscopy;;  gastric  . CHOLECYSTECTOMY  2004  . COLONOSCOPY N/A 07/18/2017   Dr. [name]: Internal and external hemorrhoids.  No future screening/surveillance colonoscopies due to age.  . ESOPHAGOGASTRODUODENOSCOPY N/A 07/18/2017   Dr. [name]: Small hiatal hernia, patchy mild inflammation characterized by congestion, erosions, erythema in the cardia, gastric body and antrum.  Diffuse moderate inflammation in the duodenal bulb and second portion duodenum.  Biopsies benign.  No H. pylori.     reports that she has never smoked. She has never used smokeless tobacco. She reports that she does not drink alcohol or use drugs.  Allergies  Allergen Reactions  . Poison Oak Extract [Poison Oak Extract] Hives  . Sulfonamide Derivatives Itching    Burning sensation all over body    Family History  Problem Relation Age of Onset  . Leukemia Mother   . Cancer Mother   . Prostate cancer Father   . Lung cancer Sister 15  . Diabetes Sister   . Colon cancer Neg Hx    Prior to Admission medications   Medication Sig Start Date End Date Taking? Authorizing Provider  acetaminophen (TYLENOL) 500 MG tablet Take 500 mg by mouth daily as needed for mild pain or moderate pain. For pain    Yes [provider]  amLODipine (NORVASC) 2.5 MG tablet Take 1 tablet (2.5 mg total) by mouth daily. 07/27/19  Yes  Fayrene Helper, MD  aspirin 325 MG tablet Take 325 mg by mouth every morning.   Yes [provider]  atorvastatin (LIPITOR) 40 MG tablet TAKE 1 TABLET BY MOUTH AT BEDTIME 12/22/18  Yes Fayrene Helper, MD  calcium-vitamin D (OSCAL WITH D) 500-200 MG-UNIT tablet Take 1 tablet by mouth.   Yes [provider]  glipiZIDE (GLUCOTROL XL) 5 MG 24 hr tablet Take 1 tablet (5 mg total) by mouth daily. 03/17/19  Yes Fayrene Helper, MD  Insulin Glargine [facility] KWIKPEN) 100 UNIT/ML SOPN INJECT 50 UNITS SUBCUTANEOUSLY AT BEDTIME (DISCONTINUE LEVEMIR) 08/29/18  Yes Fayrene Helper, MD  irbesartan (AVAPRO) 300 MG tablet Take 1 tablet (300 mg total) by mouth daily. 02/16/19  Yes Fayrene Helper, MD  Lancets [facility] ULTRASOFT) lancets USE AS DIRECTED TWICE DAILY 08/29/18  Yes Fayrene Helper, MD  metoprolol tartrate (LOPRESSOR) 50 MG tablet TAKE ONE AND ONE-HALF (1 & 1/2) TABLETS BY MOUTH TWICE DAILY 07/22/19  Yes Fayrene Helper, MD  Multiple Vitamin (MULTIVITAMIN WITH MINERALS) TABS tablet Take 1 tablet by mouth daily.   Yes [provider]  pantoprazole (PROTONIX) 40 MG tablet TAKE 1 TABLET BY MOUTH 30 MINUTES BEFORE FIRST MEAL 07/22/19  Yes Mahala Menghini, PA-C  polyethylene glycol (MIRALAX / GLYCOLAX) 17 g packet Take 17 g by mouth daily as needed.   Yes [provider]  glucose blood (ONE TOUCH ULTRA TEST) test strip USE AS INSTRUCTED FOR TWICE DAILY TESTING 08/29/18   Fayrene Helper, MD  Insulin Pen Needle (BD PEN NEEDLE MICRO U/F) 32G X 6 MM MISC USE TO INJECT INSULIN DAILY 08/17/19   Fayrene Helper, MD    Physical Exam: Vitals:   09/20/19 2030 09/20/19 2100 09/20/19 2130 09/20/19 2230  BP: (!) 172/70 (!) 164/70 (!) 151/60 (!) 147/61  Pulse: 80 72 68 65  Resp: (!) 21 20 19 19   Temp:      TempSrc:      SpO2: 99% 98% 99% 98%  Weight:      Height:        Constitutional: NAD, calm, comfortable Eyes: PERRL, lids and conjunctivae normal ENMT: Mucous membranes are moist. Posterior pharynx clear of any exudate or lesions. Neck: normal, supple, no masses, no thyromegaly Respiratory: clear to auscultation bilaterally, no wheezing, no crackles. Normal respiratory effort. No accessory muscle use.  Cardiovascular: Regular rate and rhythm alternating with short bursts of atrial fibrillation (as reported by the staff and witness on the monitor while I was examining the  patient), no murmurs / rubs / gallops. No extremity edema. 2+ pedal pulses. No carotid bruits.  Abdomen: Soft, no tenderness, no masses palpated. No hepatosplenomegaly. Bowel sounds positive.  Musculoskeletal: no clubbing / cyanosis.  Good ROM, no contractures. Normal muscle tone.  Skin: no rashes, lesions, ulcers. No induration on very limited dermatological semination. Neurologic: CN 2-12 grossly intact. Sensation intact, DTR normal. Strength 5/5 in all 4.  Psychiatric: Normal judgment and insight. Alert and oriented x 3. Normal mood.   Labs on Admission: I have personally reviewed following labs and imaging studies  CBC: Recent Labs  Lab 09/20/19 2015  WBC 6.6  HGB 11.9*  HCT 37.7  MCV 96.4  PLT 267   Basic Metabolic Panel: Recent Labs  Lab 09/20/19 2015  NA 140  K 3.9  CL 107  CO2 24  GLUCOSE 342*  BUN 21  CREATININE 1.42*  CALCIUM 9.0   GFR: Estimated Creatinine Clearance: 34.7  mL/min (A) (by C-G formula based on SCr of 1.42 mg/dL (H)). Liver Function Tests: No results for input(s): AST, ALT, ALKPHOS, BILITOT, PROT, ALBUMIN in the last 168 hours. No results for input(s): LIPASE, AMYLASE in the last 168 hours. No results for input(s): AMMONIA in the last 168 hours. Coagulation Profile: No results for input(s): INR, PROTIME in the last 168 hours. Cardiac Enzymes: No results for input(s): CKTOTAL, CKMB, CKMBINDEX, TROPONINI in the last 168 hours. BNP (last 3 results) No results for input(s): PROBNP in the last 8760 hours. HbA1C: No results for input(s): HGBA1C in the last 72 hours. CBG: No results for input(s): GLUCAP in the last 168 hours. Lipid Profile: No results for input(s): CHOL, HDL, LDLCALC, TRIG, CHOLHDL, LDLDIRECT in the last 72 hours. Thyroid Function Tests: No results for input(s): TSH, T4TOTAL, FREET4, T3FREE, THYROIDAB in the last 72 hours. Anemia Panel: No results for input(s): VITAMINB12, FOLATE, FERRITIN, TIBC, IRON, RETICCTPCT in the last 72  hours. Urine analysis:    Component Value Date/Time   BILIRUBINUR neg 08/29/2012 1559   PROTEINUR neg 08/29/2012 1559   UROBILINOGEN 0.2 08/29/2012 1559   NITRITE neg 08/29/2012 1559   LEUKOCYTESUR Negative 08/29/2012 1559    Radiological Exams on Admission: Dg Chest 2 View  Result Date: 09/20/2019 CLINICAL DATA:  Mid chest pain EXAM: CHEST - 2 VIEW COMPARISON:  03/25/2017 FINDINGS: Heart and mediastinal contours are within normal limits. No focal opacities or effusions. No acute bony abnormality. IMPRESSION: No active cardiopulmonary disease. Electronically Signed   By: Charlett NoseKevin  Dover M.D.   On: 09/20/2019 21:02    EKG: Independently reviewed.  Vent. rate 77 BPM PR interval 174 ms QRS duration 96 ms QT/QTc 382/432 ms P-R-T axes 46 38 196 Normal sinus rhythm ST & T wave abnormality, consider inferolateral ischemia Abnormal ECG  Assessment/Plan Principal Problem:   ACS (acute coronary syndrome) (HCC) Observation/telemetry. Continue heparin infusion. Decrease aspirin to 81 mg p.o. daily. Continue beta-blocker. Continue atorvastatin. Trend troponin level. Check echocardiogram. Consult cardiology.  Active Problems:   Paroxysmal atrial fibrillation (HCC) CHA?DS?-VASc Score of at least 7. Continue heparin. Continue oral metoprolol.    Type 2 diabetes mellitus with diabetic chronic kidney disease (HCC) Currently n.p.o. awaiting for cardiology evaluation. Continue daily glipizide once clear for diet. Continue Lantus 50 units SQ at bedtime. CBG monitoring regular insulin sliding scale in the hospital    Hyperlipidemia LDL goal <100 Continue atorvastatin 40 mg p.o. daily.    Esophageal reflux Continue Protonix 40 mg p.o. daily.    Essential hypertension Continue metoprolol 75 mg p.o. daily. Continue Avapro 300 mg p.o. daily. Continue amlodipine 2.5 mg p.o. daily. Monitor BP, HR, renal function electrolytes.      DVT prophylaxis: On heparin infusion. Code  Status: Full code. Family Communication: Disposition Plan: Observation in stepdown for anticoagulation and cardiology evaluation Consults called: Routine cardiology consult. Admission status: Observation/stepdown.   Bobette Moavid Manuel Owens Hara MD [facility]  If 7PM-7AM, please contact night-coverage [email or web address] Password [email or web address]  09/20/2019, 11:44 PM   This document was prepared using Dragon voice recognition software and may contain some unintended transcription errors.

## 2019-09-21 ENCOUNTER — Other Ambulatory Visit: Payer: Self-pay

## 2019-09-21 ENCOUNTER — Encounter (HOSPITAL_COMMUNITY)
Admission: EM | Disposition: A | Discharge status: Home / Self Care | Payer: Self-pay | Source: Home / Self Care | Attending: Internal Medicine

## 2019-09-21 ENCOUNTER — Ambulatory Visit: Payer: Medicare Other | Admitting: Family Medicine

## 2019-09-21 ENCOUNTER — Observation Stay (HOSPITAL_BASED_OUTPATIENT_CLINIC_OR_DEPARTMENT_OTHER): Payer: Medicare Other

## 2019-09-21 ENCOUNTER — Encounter (HOSPITAL_COMMUNITY): Payer: Self-pay

## 2019-09-21 DIAGNOSIS — I480 Paroxysmal atrial fibrillation: Secondary | ICD-10-CM | POA: Diagnosis present

## 2019-09-21 DIAGNOSIS — Z794 Long term (current) use of insulin: Secondary | ICD-10-CM | POA: Diagnosis not present

## 2019-09-21 DIAGNOSIS — I249 Acute ischemic heart disease, unspecified: Secondary | ICD-10-CM | POA: Diagnosis present

## 2019-09-21 DIAGNOSIS — I129 Hypertensive chronic kidney disease with stage 1 through stage 4 chronic kidney disease, or unspecified chronic kidney disease: Secondary | ICD-10-CM | POA: Diagnosis present

## 2019-09-21 DIAGNOSIS — E785 Hyperlipidemia, unspecified: Secondary | ICD-10-CM | POA: Diagnosis not present

## 2019-09-21 DIAGNOSIS — I361 Nonrheumatic tricuspid (valve) insufficiency: Secondary | ICD-10-CM

## 2019-09-21 DIAGNOSIS — E1122 Type 2 diabetes mellitus with diabetic chronic kidney disease: Secondary | ICD-10-CM | POA: Diagnosis present

## 2019-09-21 DIAGNOSIS — E1165 Type 2 diabetes mellitus with hyperglycemia: Secondary | ICD-10-CM | POA: Diagnosis not present

## 2019-09-21 DIAGNOSIS — Z90710 Acquired absence of both cervix and uterus: Secondary | ICD-10-CM | POA: Diagnosis not present

## 2019-09-21 DIAGNOSIS — I2510 Atherosclerotic heart disease of native coronary artery without angina pectoris: Secondary | ICD-10-CM

## 2019-09-21 DIAGNOSIS — Z9049 Acquired absence of other specified parts of digestive tract: Secondary | ICD-10-CM | POA: Diagnosis not present

## 2019-09-21 DIAGNOSIS — K5900 Constipation, unspecified: Secondary | ICD-10-CM | POA: Diagnosis present

## 2019-09-21 DIAGNOSIS — I10 Essential (primary) hypertension: Secondary | ICD-10-CM | POA: Diagnosis not present

## 2019-09-21 DIAGNOSIS — K219 Gastro-esophageal reflux disease without esophagitis: Secondary | ICD-10-CM | POA: Diagnosis present

## 2019-09-21 DIAGNOSIS — I471 Supraventricular tachycardia: Secondary | ICD-10-CM | POA: Diagnosis not present

## 2019-09-21 DIAGNOSIS — Z79899 Other long term (current) drug therapy: Secondary | ICD-10-CM | POA: Diagnosis not present

## 2019-09-21 DIAGNOSIS — I350 Nonrheumatic aortic (valve) stenosis: Secondary | ICD-10-CM | POA: Diagnosis not present

## 2019-09-21 DIAGNOSIS — Z833 Family history of diabetes mellitus: Secondary | ICD-10-CM | POA: Diagnosis not present

## 2019-09-21 DIAGNOSIS — N1832 Chronic kidney disease, stage 3b: Secondary | ICD-10-CM

## 2019-09-21 DIAGNOSIS — E782 Mixed hyperlipidemia: Secondary | ICD-10-CM | POA: Diagnosis not present

## 2019-09-21 DIAGNOSIS — I25110 Atherosclerotic heart disease of native coronary artery with unstable angina pectoris: Secondary | ICD-10-CM | POA: Diagnosis present

## 2019-09-21 DIAGNOSIS — E1121 Type 2 diabetes mellitus with diabetic nephropathy: Secondary | ICD-10-CM | POA: Diagnosis not present

## 2019-09-21 DIAGNOSIS — R079 Chest pain, unspecified: Secondary | ICD-10-CM | POA: Diagnosis not present

## 2019-09-21 DIAGNOSIS — E11649 Type 2 diabetes mellitus with hypoglycemia without coma: Secondary | ICD-10-CM | POA: Diagnosis not present

## 2019-09-21 DIAGNOSIS — Z7982 Long term (current) use of aspirin: Secondary | ICD-10-CM | POA: Diagnosis not present

## 2019-09-21 DIAGNOSIS — I200 Unstable angina: Secondary | ICD-10-CM | POA: Diagnosis not present

## 2019-09-21 DIAGNOSIS — Z20828 Contact with and (suspected) exposure to other viral communicable diseases: Secondary | ICD-10-CM | POA: Diagnosis present

## 2019-09-21 HISTORY — PX: LEFT HEART CATH AND CORONARY ANGIOGRAPHY: CATH118249

## 2019-09-21 LAB — BASIC METABOLIC PANEL
Anion gap: 7 (ref 5–15)
BUN: 14 mg/dL (ref 8–23)
CO2: 26 mmol/L (ref 22–32)
Calcium: 9.1 mg/dL (ref 8.9–10.3)
Chloride: 105 mmol/L (ref 98–111)
Creatinine, Ser: 1.03 mg/dL — ABNORMAL HIGH (ref 0.44–1.00)
GFR calc Af Amer: 59 mL/min — ABNORMAL LOW (ref >60)
GFR calc non Af Amer: 51 mL/min — ABNORMAL LOW (ref >60)
Glucose, Bld: 118 mg/dL — ABNORMAL HIGH (ref 70–99)
Potassium: 3.9 mmol/L (ref 3.5–5.1)
Sodium: 138 mmol/L (ref 135–145)

## 2019-09-21 LAB — GLUCOSE, CAPILLARY
Glucose-Capillary: 182 mg/dL — ABNORMAL HIGH (ref 70–99)
Glucose-Capillary: 63 mg/dL — ABNORMAL LOW (ref 70–99)
Glucose-Capillary: 59 mg/dL — ABNORMAL LOW (ref 70–99)
Glucose-Capillary: 155 mg/dL — ABNORMAL HIGH (ref 70–99)
Glucose-Capillary: 66 mg/dL — ABNORMAL LOW (ref 70–99)
Glucose-Capillary: 145 mg/dL — ABNORMAL HIGH (ref 70–99)
Glucose-Capillary: 151 mg/dL — ABNORMAL HIGH (ref 70–99)

## 2019-09-21 LAB — ECHOCARDIOGRAM COMPLETE
Height: 67 in
Weight: 3199.32 oz

## 2019-09-21 LAB — CBC
HCT: 35 % — ABNORMAL LOW (ref 36.0–46.0)
Hemoglobin: 10.8 g/dL — ABNORMAL LOW (ref 12.0–15.0)
MCH: 29.8 pg (ref 26.0–34.0)
MCHC: 30.9 g/dL (ref 30.0–36.0)
MCV: 96.4 fL (ref 80.0–100.0)
Platelets: 178 K/uL (ref 150–400)
RBC: 3.63 MIL/uL — ABNORMAL LOW (ref 3.87–5.11)
RDW: 12.5 % (ref 11.5–15.5)
WBC: 6.9 K/uL (ref 4.0–10.5)
nRBC: 0 % (ref 0.0–0.2)

## 2019-09-21 LAB — SARS CORONAVIRUS 2 BY RT PCR (HOSPITAL ORDER, PERFORMED IN ~~LOC~~ HOSPITAL LAB): SARS Coronavirus 2: NEGATIVE

## 2019-09-21 LAB — MAGNESIUM: Magnesium: 1.7 mg/dL (ref 1.7–2.4)

## 2019-09-21 LAB — PHOSPHORUS: Phosphorus: 3.2 mg/dL (ref 2.5–4.6)

## 2019-09-21 LAB — MRSA PCR SCREENING: MRSA by PCR: NEGATIVE

## 2019-09-21 LAB — HEPARIN LEVEL (UNFRACTIONATED)
Heparin Unfractionated: 1.1 IU/mL — ABNORMAL HIGH (ref 0.30–0.70)
Heparin Unfractionated: 0.84 IU/mL — ABNORMAL HIGH (ref 0.30–0.70)

## 2019-09-21 SURGERY — LEFT HEART CATH AND CORONARY ANGIOGRAPHY
Anesthesia: LOCAL

## 2019-09-21 MED ORDER — SODIUM CHLORIDE 0.9 % IV SOLN: 250.0000 mL | INTRAVENOUS | Status: DC | PRN

## 2019-09-21 MED ORDER — HEPARIN SODIUM (PORCINE) 1000 UNIT/ML IJ SOLN
INTRAMUSCULAR | Status: DC | PRN
  Administered 2019-09-21: 4500 [IU] via INTRAVENOUS

## 2019-09-21 MED ORDER — HEPARIN (PORCINE) IN NACL 1000-0.9 UT/500ML-% IV SOLN
INTRAVENOUS | Status: AC
  Filled 2019-09-21: qty 1000

## 2019-09-21 MED ORDER — MIDAZOLAM HCL 2 MG/2ML IJ SOLN
INTRAMUSCULAR | Status: AC
  Filled 2019-09-21: qty 2

## 2019-09-21 MED ORDER — HYDRALAZINE HCL 20 MG/ML IJ SOLN: 10.0000 mg | INTRAMUSCULAR | Status: AC | PRN

## 2019-09-21 MED ORDER — FENTANYL CITRATE (PF) 100 MCG/2ML IJ SOLN
INTRAMUSCULAR | Status: AC
  Filled 2019-09-21: qty 2

## 2019-09-21 MED ORDER — MAGNESIUM SULFATE 2 GM/50ML IV SOLN
2.0000 g | Freq: Once | INTRAVENOUS | Status: AC
  Administered 2019-09-21: 06:00:00 2 g via INTRAVENOUS
  Filled 2019-09-21: qty 50

## 2019-09-21 MED ORDER — POLYETHYLENE GLYCOL 3350 17 G PO PACK: 17.0000 g | PACK | Freq: Every day | ORAL | Status: DC | PRN

## 2019-09-21 MED ORDER — CHLORHEXIDINE GLUCONATE CLOTH 2 % EX PADS
6.0000 | MEDICATED_PAD | Freq: Every day | CUTANEOUS | Status: DC
  Administered 2019-09-21: 6 via TOPICAL

## 2019-09-21 MED ORDER — ATORVASTATIN CALCIUM 80 MG PO TABS: 80.0000 mg | ORAL_TABLET | Freq: Every day | ORAL | Status: DC

## 2019-09-21 MED ORDER — SODIUM CHLORIDE 0.9 % IV SOLN
INTRAVENOUS | Status: DC
  Administered 2019-09-21: 13:00:00 via INTRAVENOUS

## 2019-09-21 MED ORDER — DIAZEPAM 5 MG PO TABS: 5.0000 mg | ORAL_TABLET | Freq: Four times a day (QID) | ORAL | Status: DC | PRN

## 2019-09-21 MED ORDER — HEPARIN SODIUM (PORCINE) 1000 UNIT/ML IJ SOLN
INTRAMUSCULAR | Status: AC
  Filled 2019-09-21: qty 1

## 2019-09-21 MED ORDER — MIDAZOLAM HCL 2 MG/2ML IJ SOLN
INTRAMUSCULAR | Status: DC | PRN
  Administered 2019-09-21: 2 mg via INTRAVENOUS

## 2019-09-21 MED ORDER — CALCIUM CARBONATE-VITAMIN D 500-200 MG-UNIT PO TABS
1.0000 | ORAL_TABLET | Freq: Every day | ORAL | Status: DC
  Administered 2019-09-21 – 2019-09-22 (×2): 1 via ORAL
  Filled 2019-09-21 (×2): qty 1

## 2019-09-21 MED ORDER — ATORVASTATIN CALCIUM 40 MG PO TABS: 40.0000 mg | ORAL_TABLET | Freq: Every day | ORAL | Status: DC

## 2019-09-21 MED ORDER — FENTANYL CITRATE (PF) 100 MCG/2ML IJ SOLN
INTRAMUSCULAR | Status: DC | PRN
  Administered 2019-09-21: 25 ug via INTRAVENOUS

## 2019-09-21 MED ORDER — SODIUM CHLORIDE 0.9% FLUSH: 3.0000 mL | INTRAVENOUS | Status: DC | PRN

## 2019-09-21 MED ORDER — DEXTROSE 50 % IV SOLN
INTRAVENOUS | Status: AC
  Administered 2019-09-21: 50 mL
  Filled 2019-09-21: qty 50

## 2019-09-21 MED ORDER — HEPARIN (PORCINE) IN NACL 1000-0.9 UT/500ML-% IV SOLN
INTRAVENOUS | Status: DC | PRN
  Administered 2019-09-21: 500 mL

## 2019-09-21 MED ORDER — INSULIN GLARGINE 100 UNIT/ML ~~LOC~~ SOLN
18.0000 [IU] | Freq: Every day | SUBCUTANEOUS | Status: DC
  Administered 2019-09-21: 18 [IU] via SUBCUTANEOUS
  Filled 2019-09-21 (×3): qty 0.18

## 2019-09-21 MED ORDER — ONDANSETRON HCL 4 MG/2ML IJ SOLN: 4.0000 mg | Freq: Four times a day (QID) | INTRAMUSCULAR | Status: DC | PRN

## 2019-09-21 MED ORDER — VERAPAMIL HCL 2.5 MG/ML IV SOLN
INTRAVENOUS | Status: DC | PRN
  Administered 2019-09-21: 10 mL via INTRA_ARTERIAL

## 2019-09-21 MED ORDER — IRBESARTAN 300 MG PO TABS
300.0000 mg | ORAL_TABLET | Freq: Every day | ORAL | Status: DC
  Administered 2019-09-21 – 2019-09-22 (×2): 300 mg via ORAL
  Filled 2019-09-21: qty 2
  Filled 2019-09-21: qty 1

## 2019-09-21 MED ORDER — IOHEXOL 350 MG/ML SOLN
INTRAVENOUS | Status: DC | PRN
  Administered 2019-09-21: 45 mL

## 2019-09-21 MED ORDER — DEXTROSE 50 % IV SOLN
INTRAVENOUS | Status: AC
  Administered 2019-09-21: 16:00:00
  Filled 2019-09-21: qty 50

## 2019-09-21 MED ORDER — SODIUM CHLORIDE 0.9 % IV SOLN
INTRAVENOUS | Status: AC
  Administered 2019-09-22: 05:00:00 via INTRAVENOUS

## 2019-09-21 MED ORDER — SODIUM CHLORIDE 0.9% FLUSH: 3.0000 mL | Freq: Two times a day (BID) | INTRAVENOUS | Status: DC

## 2019-09-21 MED ORDER — METOPROLOL TARTRATE 5 MG/5ML IV SOLN: 5.0000 mg | Freq: Once | INTRAVENOUS | Status: DC

## 2019-09-21 MED ORDER — GLIPIZIDE ER 5 MG PO TB24
5.0000 mg | ORAL_TABLET | Freq: Every day | ORAL | Status: DC
  Administered 2019-09-21: 5 mg via ORAL
  Filled 2019-09-21: qty 1

## 2019-09-21 MED ORDER — ASPIRIN 81 MG PO CHEW
81.0000 mg | CHEWABLE_TABLET | Freq: Every day | ORAL | Status: DC
  Administered 2019-09-22: 81 mg via ORAL
  Filled 2019-09-21: qty 1

## 2019-09-21 MED ORDER — SODIUM CHLORIDE 0.9% FLUSH
3.0000 mL | Freq: Two times a day (BID) | INTRAVENOUS | Status: DC
  Administered 2019-09-21: 3 mL via INTRAVENOUS

## 2019-09-21 MED ORDER — METOPROLOL TARTRATE 50 MG PO TABS
75.0000 mg | ORAL_TABLET | Freq: Two times a day (BID) | ORAL | Status: DC
  Administered 2019-09-21 – 2019-09-22 (×3): 75 mg via ORAL
  Filled 2019-09-21 (×3): qty 1

## 2019-09-21 MED ORDER — LIDOCAINE HCL (PF) 1 % IJ SOLN
INTRAMUSCULAR | Status: DC | PRN
  Administered 2019-09-21: 2 mL

## 2019-09-21 MED ORDER — LIDOCAINE HCL (PF) 1 % IJ SOLN
INTRAMUSCULAR | Status: AC
  Filled 2019-09-21: qty 30

## 2019-09-21 MED ORDER — INSULIN GLARGINE 100 UNIT/ML ~~LOC~~ SOLN
50.0000 [IU] | Freq: Every day | SUBCUTANEOUS | Status: DC
  Filled 2019-09-21: qty 0.5

## 2019-09-21 MED ORDER — ACETAMINOPHEN 325 MG PO TABS: 650.0000 mg | ORAL_TABLET | ORAL | Status: DC | PRN

## 2019-09-21 MED ORDER — DEXTROSE 5 % IV SOLN
INTRAVENOUS | Status: DC
  Administered 2019-09-21: 16:00:00 via INTRAVENOUS

## 2019-09-21 MED ORDER — VERAPAMIL HCL 2.5 MG/ML IV SOLN
INTRAVENOUS | Status: AC
  Filled 2019-09-21: qty 2

## 2019-09-21 MED ORDER — PANTOPRAZOLE SODIUM 40 MG PO TBEC
40.0000 mg | DELAYED_RELEASE_TABLET | Freq: Every day | ORAL | Status: DC
  Administered 2019-09-21 – 2019-09-22 (×2): 40 mg via ORAL
  Filled 2019-09-21 (×2): qty 1

## 2019-09-21 MED ORDER — AMLODIPINE BESYLATE 2.5 MG PO TABS
2.5000 mg | ORAL_TABLET | Freq: Every day | ORAL | Status: DC
  Administered 2019-09-21 – 2019-09-22 (×2): 2.5 mg via ORAL
  Filled 2019-09-21 (×2): qty 1

## 2019-09-21 MED ORDER — HEPARIN (PORCINE) 25000 UT/250ML-% IV SOLN: 750.0000 [IU]/h | INTRAVENOUS | Status: DC

## 2019-09-21 MED ORDER — SODIUM CHLORIDE 0.9 % IV SOLN
INTRAVENOUS | Status: AC
  Administered 2019-09-21: 22:00:00 via INTRAVENOUS

## 2019-09-21 MED ORDER — LABETALOL HCL 5 MG/ML IV SOLN: 10.0000 mg | INTRAVENOUS | Status: AC | PRN

## 2019-09-21 SURGICAL SUPPLY — 11 items
CATH INFINITI 5FR ANG PIGTAIL (CATHETERS) ×1 IMPLANT
CATH OPTITORQUE TIG 4.0 5F (CATHETERS) ×1 IMPLANT
DEVICE RAD COMP TR BAND LRG (VASCULAR PRODUCTS) ×1 IMPLANT
GLIDESHEATH SLEND SS 6F .021 (SHEATH) ×1 IMPLANT
GUIDEWIRE INQWIRE 1.5J.035X260 (WIRE) IMPLANT
INQWIRE 1.5J .035X260CM (WIRE) ×2
KIT HEART LEFT (KITS) ×2 IMPLANT
PACK CARDIAC CATHETERIZATION (CUSTOM PROCEDURE TRAY) ×2 IMPLANT
SHEATH PROBE COVER 6X72 (BAG) ×1 IMPLANT
TRANSDUCER W/STOPCOCK (MISCELLANEOUS) ×2 IMPLANT
TUBING CIL FLEX 10 FLL-RA (TUBING) ×2 IMPLANT

## 2019-09-21 NOTE — Interval H&P Note (Signed)
Cath Lab Visit (complete for each Cath Lab visit)  Clinical Evaluation Leading to the Procedure:   ACS: No.  Non-ACS:    Anginal Classification: CCS III  Anti-ischemic medical therapy: Maximal Therapy (2 or more classes of medications)  Non-Invasive Test Results: No non-invasive testing performed  Prior CABG: No previous CABG      History and Physical Interval Note:  09/21/2019 5:23 PM  Kimberly Freeman  has presented today for surgery, with the diagnosis of chest pain.  The various methods of treatment have been discussed with the patient and family. After consideration of risks, benefits and other options for treatment, the patient has consented to  Procedure(s): LEFT HEART CATH AND CORONARY ANGIOGRAPHY (N/A) as a surgical intervention.  The patient's history has been reviewed, patient examined, no change in status, stable for surgery.  I have reviewed the patient's chart and labs.  Questions were answered to the patient's satisfaction.     Shelva Majestic

## 2019-09-21 NOTE — Progress Notes (Signed)
Patient admitted to unit approximately 1845; RN to bedside. Patient A&Ox4, no complaints of pain. T band in place at right radial with no swelling, pain nor bleeding observed. VS assessed.

## 2019-09-21 NOTE — Progress Notes (Signed)
*  PRELIMINARY RESULTS* Echocardiogram 2D Echocardiogram has been performed.  Samuel Germany 09/21/2019, 9:23 AM

## 2019-09-21 NOTE — Consult Note (Addendum)
 Cardiology Consultation:   Patient ID: Kimberly Freeman; [id number]; 10/28/1940   Admit date: 09/20/2019 Date of Consult: 09/21/2019  Primary Care Provider: Simpson, Margaret E, MD Primary Cardiologist: Suresh Koneswaran, MD Primary Electrophysiologist: None   Patient Profile:   Kimberly Freeman is an 78 y.o. female with a history of type 2 diabetes mellitus, hypertension, hyperlipidemia, and possibly remote atrial fibrillation who is being seen today for the evaluation of unstable angina at the request of Dr. Madera.  History of Present Illness:   [name] presents to the hospital reporting recent onset of recurrent chest pressure that began about 1 week ago.  She states that symptoms feel like someone "standing" on her chest, she has also experienced dyspnea on exertion and fatigue, the symptoms occurred with low-level activity on the day of presentation concerning for unstable angina.  She does not report any recent palpitations, no lightheadedness or syncope.  ECG shows progressive ST segment abnormalities compared to prior tracing from 2017, as discussed below.  High-sensitivity troponin I is minimally increased at 23, she is chest pain-free on IV heparin.  She has no prior documented history of ischemic heart disease, negative Myoview as of 2017.  Chest x-ray reports no acute process.  Echocardiogram has been obtained and pending review.  Past Medical History:  Diagnosis Date  . Arthritis   . Diabetes mellitus, type 2 (HCC)   . Essential hypertension   . GERD (gastroesophageal reflux disease)   . Hyperlipidemia     Past Surgical History:  Procedure Laterality Date  . ABDOMINAL HYSTERECTOMY    . BIOPSY  07/18/2017   Procedure: BIOPSY;  Surgeon: Fields, Sandi L, MD;  Location: [facility];  Service: Endoscopy;;  gastric  . CHOLECYSTECTOMY  2004  . COLONOSCOPY N/A 07/18/2017   Dr. fields: Internal and external hemorrhoids.  No future screening/surveillance colonoscopies  due to age.  . ESOPHAGOGASTRODUODENOSCOPY N/A 07/18/2017   Dr. fields: Small hiatal hernia, patchy mild inflammation characterized by congestion, erosions, erythema in the cardia, gastric body and antrum.  Diffuse moderate inflammation in the duodenal bulb and second portion duodenum.  Biopsies benign.  No H. pylori.     Inpatient Medications: Scheduled Meds: . amLODipine  2.5 mg Oral Daily  . aspirin EC  81 mg Oral Daily  . atorvastatin  40 mg Oral QHS  . calcium-vitamin D  1 tablet Oral Q breakfast  . Chlorhexidine Gluconate Cloth  6 each Topical Daily  . glipiZIDE  5 mg Oral Daily  . insulin aspart  0-5 Units Subcutaneous QHS  . insulin aspart  0-9 Units Subcutaneous TID WC  . insulin glargine  50 Units Subcutaneous Q2200  . irbesartan  300 mg Oral Daily  . metoprolol tartrate  75 mg Oral BID  . pantoprazole  40 mg Oral QAC breakfast  . sodium chloride flush  3 mL Intravenous Once   Continuous Infusions: . heparin     PRN Meds: acetaminophen **OR** acetaminophen, ALPRAZolam, nitroGLYCERIN, ondansetron (ZOFRAN) IV, polyethylene glycol  Allergies:    Allergies  Allergen Reactions  . Poison Oak Extract [Poison Oak Extract] Hives  . Sulfonamide Derivatives Itching    Burning sensation all over body    Social History:   Social History   Socioeconomic History  . Marital status: Married    Spouse name: Nathaniel   . Number of children: 0  . Years of education: 12+  . Highest education level: 12th grade  Occupational History  . Occupation: retired   Social   Needs  . Financial resource strain: Not hard at all  . Food insecurity    Worry: Never true    Inability: Never true  . Transportation needs    Medical: No    Non-medical: No  Tobacco Use  . Smoking status: Never Smoker  . Smokeless tobacco: Never Used  Substance and Sexual Activity  . Alcohol use: No    Alcohol/week: 0.0 standard drinks  . Drug use: No  . Sexual activity: Not Currently    Partners: Male   Lifestyle  . Physical activity    Days per week: 4 days    Minutes per session: 40 min  . Stress: Not at all  Relationships  . Social connections    Talks on phone: More than three times a week    Gets together: Once a week    Attends religious service: 1 to 4 times per year    Active member of club or organization: Yes    Attends meetings of clubs or organizations: More than 4 times per year    Relationship status: Married  . Intimate partner violence    Fear of current or ex partner: No    Emotionally abused: No    Physically abused: No    Forced sexual activity: No  Other Topics Concern  . Not on file  Social History Narrative   Lives alone with husband     Family History:   The patient's family history includes Cancer in her mother; Diabetes in her sister; Leukemia in her mother; Lung cancer (age of onset: 28) in her sister; Prostate cancer in her father. There is no history of Colon cancer.  ROS:  Please see the history of present illness.  Exertional fatigue.  All other ROS reviewed and negative.     Physical Exam/Data:   Vitals:   09/21/19 0400 09/21/19 0500 09/21/19 0600 09/21/19 0740  BP: (!) 141/59  (!) 152/67   Pulse: (!) 57  (!) 59 (!) 57  Resp: (!) 21  19 19   Temp: (!) 97.3 F (36.3 C)   97.9 F (36.6 C)  TempSrc: Oral   Oral  SpO2: 95%  99% 96%  Weight:  90.7 kg    Height:        Intake/Output Summary (Last 24 hours) at 09/21/2019 0928 Last data filed at 09/21/2019 09/23/2019 Gross per 24 hour  Intake 136.21 ml  Output 450 ml  Net -313.79 ml   Filed Weights   09/20/19 1953 09/21/19 0048 09/21/19 0500  Weight: 81.6 kg 90.7 kg 90.7 kg   Body mass index is 31.32 kg/m.   Gen: Elderly woman, no distress. HEENT: Conjunctiva and lids normal, oropharynx clear. Neck: Supple, no elevated JVP or carotid bruits, no thyromegaly. Lungs: Clear to auscultation, nonlabored breathing at rest. Cardiac: Regular rate and rhythm, no S3, 2/6 systolic murmur, no  pericardial rub. Abdomen: Soft, nontender, bowel sounds present, no guarding or rebound. Extremities: No pitting edema, distal pulses 2+. Skin: Warm and dry. Musculoskeletal: No kyphosis. Neuropsychiatric: Alert and oriented x3, affect grossly appropriate.  EKG:  An ECG dated 09/20/2019 was personally reviewed today and demonstrated:  Sinus rhythm with high lateral/lateral ST segment depression, ST elevation in aVR.  Telemetry:  I personally reviewed telemetry which shows sinus rhythm with bursts of PAT and aberrant PACs.  Relevant CV Studies:  Lexiscan Cardiolite 12/15/2015:  There was no ST segment deviation noted during stress.  The study is normal.  This is a low risk study.  Nuclear stress EF: 60%.  Laboratory Data:  Chemistry Recent Labs  Lab 09/20/19 2015  NA 140  K 3.9  CL 107  CO2 24  GLUCOSE 342*  BUN 21  CREATININE 1.42*  CALCIUM 9.0  GFRNONAA 35*  GFRAA 40*  ANIONGAP 9    No results for input(s): PROT, ALBUMIN, AST, ALT, ALKPHOS, BILITOT in the last 168 hours. Hematology Recent Labs  Lab 09/20/19 2015 09/21/19 0436  WBC 6.6 6.9  RBC 3.91 3.63*  HGB 11.9* 10.8*  HCT 37.7 35.0*  MCV 96.4 96.4  MCH 30.4 29.8  MCHC 31.6 30.9  RDW 12.6 12.5  PLT 198 178   Cardiac Enzymes Recent Labs  Lab 09/20/19 2015 09/20/19 2155  TROPONINIHS 9 23*    Radiology/Studies:  Dg Chest 2 View  Result Date: 09/20/2019 CLINICAL DATA:  Mid chest pain EXAM: CHEST - 2 VIEW COMPARISON:  03/25/2017 FINDINGS: Heart and mediastinal contours are within normal limits. No focal opacities or effusions. No acute bony abnormality. IMPRESSION: No active cardiopulmonary disease. Electronically Signed   By: Rolm Baptise M.D.   On: 09/20/2019 21:02    Assessment and Plan:   1.  Unstable angina, minor increase in high-sensitivity troponin I, but progressive ST segment abnormalities by ECG and TIMI risk score of 6.  Currently chest pain-free on IV heparin and hemodynamically  stable.  On aspirin, statin, Norvasc, Avapro and beta-blocker.  2.  Essential hypertension, currently on Norvasc, Avapro, and Lopressor.  3.  Type 2 diabetes mellitus, uncontrolled with hyperglycemia.  She is on Glucotrol XL patient, currently on sliding scale.  Hemoglobin A1c 7.5% as of October.  4.  Mixed hyperlipidemia, on Lipitor as an outpatient.  LDL 93 in May.  5.  Apparent remote history of atrial fibrillation approximately 20 years ago per chart review, not anticoagulated and without recent documented recurrences.   Chart reviewed and testing discussed with patient.  We have discussed the risks and benefits of proceeding to a diagnostic cardiac catheterization with eye toward revascularization, she is in agreement to proceed.  Continue current regimen with intensification of statin therapy. Contact made with our patient care coordinator at [facility] to facilitate transfer, SARS coronavirus 2 test is pending and bed status is limited however.  Patient last saw Dr. Bronson Ing back in 2016 and can be reestablished after hospital discharge.  Signed, Rozann Lesches, MD  09/21/2019 9:28 AM

## 2019-09-21 NOTE — Progress Notes (Addendum)
PROGRESS NOTE    Kimberly Freeman  ZOX:[id number] DOB: [birth year] DOA: 09/20/2019 PCP: Kerri PerchesSimpson, Margaret E, MD     Brief Narrative:  78 y.o. female with medical history significant of osteoarthritis, type 2 diabetes, hypertension, GERD, hyperlipidemia, remote history of atrial fibrillation about 20 years ago off of anticoagulation for a number of years now who is coming to the emergency department with complaints of precordial chest pain, pressure-like, nonradiated, associated with dyspnea, palpitations and diaphoresis.  She denies dizziness, nausea or emesis, but states that she has had some mild lower extremity edema.  She denies fever, chills, sore throat or rhinorrhea.  No wheezing or productive cough.  Denies abdominal pain, diarrhea,  melena or hematochezia.  She has occasional constipation.  No dysuria, frequency or hematuria.  No polyuria, polydipsia, polyphagia or blurred vision.  ED Course: Initial vital signs temperature 97.8 F, pulse 77, respiration 18, blood pressure 181/88 mmHg and O2 sat 96% on room air.  The patient had EKG changes and [facility]  They had no beds and recommended admission with cardiology consult at this facility.  She was started on heparin drip and cardiology service consulted.    Assessment & Plan: 1-ACS (acute coronary syndrome) (HCC) -Presentation and findings suggesting unstable angina -patient seen by cardiology service and planning heart cath later today -continue heparin drip, ASA, statins, b-blocker and Avapro. -follow clinical response and further recommendations. -follow 2-D echo  2-Type 2 diabetes mellitus with diabetic chronic kidney disease (HCC) -continue holding oral hypoglycemic regimne -continue SSI and follow CBG's -mild hypoglycemia earlier.  3-Hyperlipidemia LDL goal <100 -continue statins  4-Esophageal reflux -Continue PPI  5-Essential hypertension -stable overall -follow VS  6-Paroxysmal atrial  fibrillation (HCC) -rate controlled currently -receiving heparin drip -not on anticoagulation prior to admission.   7-CKD stage 3b -stable and at baseline -follow renal function trend   DVT prophylaxis: Heparin drip Code Status: Full code Family Communication: No family at bedside. Disposition Plan: [facility]; continue heparin drip and current medication therapy.  Consultants:   Cardiology service.  Procedures:   2D echo: Pending  Antimicrobials:  Anti-infectives (From admission, onward)   None      Subjective: Denies chest pain currently, no nausea, no vomiting, no abdominal pain.  Patient expressed having some intermittent sweats and diaphoresis overnight.  Objective: Vitals:   09/21/19 0900 09/21/19 0953 09/21/19 1136 09/21/19 1200  BP: (!) 161/68 (!) 161/68  128/71  Pulse: 60 69 (!) 58 (!) 53  Resp: 17  16 (!) 24  Temp:   98 F (36.7 C)   TempSrc:   Oral   SpO2: 97%  98% 96%  Weight:      Height:        Intake/Output Summary (Last 24 hours) at 09/21/2019 1340 Last data filed at 09/21/2019 0951 Gross per 24 hour  Intake 196.88 ml  Output 450 ml  Net -253.12 ml   Filed Weights   09/20/19 1953 09/21/19 0048 09/21/19 0500  Weight: 81.6 kg 90.7 kg 90.7 kg    Examination: General exam: Alert, awake, oriented x 3, reports no having chest pain at this moment; no nausea, no vomiting, patient is afebrile.  Good O2 sat on room air. Respiratory system: Clear to auscultation. Respiratory effort normal. Cardiovascular system: Rate controlled, No rubs, no gallops. Positive SEM.  Gastrointestinal system: Abdomen is nondistended, soft and nontender. No organomegaly or masses felt. Normal bowel sounds heard. Central nervous system:  Alert and oriented. No focal neurological deficits. Extremities: No cyanosis or clubbing; no edema appreciated on her lower extremities. Skin: No rashes, no petechiae. Psychiatry: Judgement  and insight appear normal. Mood & affect appropriate.     Data Reviewed: I have personally reviewed following labs and imaging studies  CBC: Recent Labs  Lab 09/20/19 2015 09/21/19 0436  WBC 6.6 6.9  HGB 11.9* 10.8*  HCT 37.7 35.0*  MCV 96.4 96.4  PLT 198 178   Basic Metabolic Panel: Recent Labs  Lab 09/20/19 2015 09/21/19 0436  NA 140  --   K 3.9  --   CL 107  --   CO2 24  --   GLUCOSE 342*  --   BUN 21  --   CREATININE 1.42*  --   CALCIUM 9.0  --   MG  --  1.7  PHOS  --  3.2   GFR: Estimated Creatinine Clearance: 36.5 mL/min (A) (by C-G formula based on SCr of 1.42 mg/dL (H)).  CBG: Recent Labs  Lab 09/21/19 0108 09/21/19 0742 09/21/19 1133 09/21/19 1242  GLUCAP 182* 63* 59* 155*   Urine analysis:    Component Value Date/Time   BILIRUBINUR neg 08/29/2012 1559   PROTEINUR neg 08/29/2012 1559   UROBILINOGEN 0.2 08/29/2012 1559   NITRITE neg 08/29/2012 1559   LEUKOCYTESUR Negative 08/29/2012 1559    Recent Results (from the past 240 hour(s))  SARS Coronavirus 2 by RT PCR (hospital order, performed in [facility] hospital lab) Nasopharyngeal Nasopharyngeal Swab     Status: None   Collection Time: 09/21/19 12:24 AM   Specimen: Nasopharyngeal Swab  Result Value Ref Range Status   SARS Coronavirus 2 NEGATIVE NEGATIVE Final    Comment: (NOTE) SARS-CoV-2 target nucleic acids are NOT DETECTED. The SARS-CoV-2 RNA is generally detectable in upper and lower respiratory specimens during the acute phase of infection. The lowest concentration of SARS-CoV-2 viral copies this assay can detect is 250 copies / mL. A negative result does not preclude SARS-CoV-2 infection and should not be used as the sole basis for treatment or other patient management decisions.  A negative result may occur with improper specimen collection / handling, submission of specimen other than nasopharyngeal swab, presence of viral mutation(s) within the areas targeted by this assay, and  inadequate number of viral copies (<250 copies / mL). A negative result must be combined with clinical observations, patient history, and epidemiological information. Fact Sheet for Patients:   BoilerBrush.com.cy Fact Sheet for Healthcare Providers: https://pope.com/ This test is not yet approved or cleared  by the [place] FDA and has been authorized for detection and/or diagnosis of SARS-CoV-2 by FDA under an Emergency Use Authorization (EUA).  This EUA will remain in effect (meaning this test can be used) for the duration of the COVID-19 declaration under Section 564(b)(1) of the Act, 21 U.S.C. section 360bbb-3(b)(1), unless the authorization is terminated or revoked sooner. Performed at [facility], [address], [place], [place] [address]   MRSA PCR Screening     Status: None   Collection Time: 09/21/19 12:47 AM   Specimen: Nasal Mucosa; Nasopharyngeal  Result Value Ref Range Status   MRSA by PCR NEGATIVE NEGATIVE Final    Comment:        The GeneXpert MRSA Assay (FDA approved for NASAL specimens only), is one component of a comprehensive MRSA colonization surveillance program. It is not intended to diagnose MRSA infection nor to guide or monitor treatment for MRSA infections. Performed at [facility], [address], [place], [place] [address]       Radiology Studies: Dg Chest 2 View  Result Date: 09/20/2019 CLINICAL DATA:  Mid chest pain EXAM: CHEST - 2 VIEW COMPARISON:  03/25/2017 FINDINGS: Heart and mediastinal contours are within normal limits. No focal opacities or effusions. No acute bony abnormality. IMPRESSION: No active cardiopulmonary disease. Electronically Signed   By: Rolm Baptise M.D.   On: 09/20/2019 21:02    Scheduled Meds: . amLODipine  2.5 mg Oral Daily  . aspirin EC  81 mg Oral Daily  . atorvastatin  40 mg Oral QHS  . calcium-vitamin D  1 tablet Oral Q breakfast  . Chlorhexidine Gluconate  Cloth  6 each Topical Daily  . insulin aspart  0-5 Units Subcutaneous QHS  . insulin aspart  0-9 Units Subcutaneous TID WC  . insulin glargine  18 Units Subcutaneous Q2200  . irbesartan  300 mg Oral Daily  . metoprolol tartrate  75 mg Oral BID  . pantoprazole  40 mg Oral QAC breakfast  . sodium chloride flush  3 mL Intravenous Once  . sodium chloride flush  3 mL Intravenous Q12H   Continuous Infusions: . sodium chloride 50 mL/hr at 09/21/19 1307  . heparin       LOS: 0 days    Time spent: 35 minutes.    Barton Dubois, MD [facility] Pager [phone]   09/21/2019, 1:40 PM

## 2019-09-21 NOTE — Progress Notes (Signed)
Patient's heart rate increased to 140's sustained after going to bedside commode. Patient denies chest pain but did say she felt like her heart was beating faster than normal. EKG performed, see chart. Heart rate dropped back into 60's after approximately 10-15 minutes. MD made aware. Will use bedpan or purewick until rate is better controlled, patient agrees.

## 2019-09-21 NOTE — Progress Notes (Signed)
Kimberly Freeman is a 78 y.o. female patient admitted from ED awake, alert - oriented  X 4 - no acute distress noted.  VSS - Blood pressure 127/73, pulse 69, temperature 97.7 F (36.5 C), temperature source Oral, resp. rate 18, height 5\' 7"  (1.702 m), weight 90.7 kg, SpO2 97 %.    IV in place, occlusive dsg intact without redness.  Orientation to room, and floor completed with information packet given to patient/family.  Patient declined safety video at this time.  Admission INP armband ID verified with patient/family, and in place.    SR up x 2, fall assessment complete, with patient and family able to verbalize understanding of risk associated with falls, and verbalized understanding to call nsg before up out of bed.    Call light within reach, patient able to voice, and demonstrate understanding.  Skin to skin check completed with second RN; skin integrity documented. Skin, clean-dry- intact without evidence of bruising, or skin tears.    No evidence of skin break down noted on exam.     Will cont to eval and treat per MD orders.  Howard Pouch, RN 09/21/2019 8:42 PM

## 2019-09-21 NOTE — Progress Notes (Signed)
ANTICOAGULATION CONSULT NOTE -  Pharmacy Consult for heparin gtt  Indication: chest pain/ACS  Allergies  Allergen Reactions  . Poison Oak Extract [Poison Oak Extract] Hives  . Sulfonamide Derivatives Itching    Burning sensation all over body    Patient Measurements: Height: 5\' 7"  (170.2 cm) Weight: 199 lb 15.3 oz (90.7 kg) IBW/kg (Calculated) : 61.6  HEPARIN DW (KG): 81.1   Vital Signs: Temp: 97.9 F (36.6 C) (11/30 0740) Temp Source: Oral (11/30 0740) BP: 152/67 (11/30 0600) Pulse Rate: 57 (11/30 0740)  Labs: Recent Labs    09/20/19 2015 09/21/19 0436  HGB 11.9* 10.8*  HCT 37.7 35.0*  PLT 198 178  HEPARINUNFRC  --  1.10*  CREATININE 1.42*  --     Estimated Creatinine Clearance: 36.5 mL/min (A) (by C-G formula based on SCr of 1.42 mg/dL (H)).   Medical History: Past Medical History:  Diagnosis Date  . Arthritis   . Diabetes mellitus, type 2 (HCC)   . Essential hypertension   . GERD (gastroesophageal reflux disease)   . Hyperlipidemia     Medications:  Medications Prior to Admission  Medication Sig Dispense Refill Last Dose  . acetaminophen (TYLENOL) 500 MG tablet Take 500 mg by mouth daily as needed for mild pain or moderate pain. For pain    09/20/2019  . amLODipine (NORVASC) 2.5 MG tablet Take 1 tablet (2.5 mg total) by mouth daily. 90 tablet 1 09/20/2019  . aspirin 325 MG tablet Take 325 mg by mouth every morning.   09/20/2019  . atorvastatin (LIPITOR) 40 MG tablet TAKE 1 TABLET BY MOUTH AT BEDTIME 90 tablet 3 09/19/2019 at Unknown time  . calcium-vitamin D (OSCAL WITH D) 500-200 MG-UNIT tablet Take 1 tablet by mouth.   09/20/2019  . glipiZIDE (GLUCOTROL XL) 5 MG 24 hr tablet Take 1 tablet (5 mg total) by mouth daily. 90 tablet 3 Past Week at Unknown time  . Insulin Glargine (BASAGLAR KWIKPEN) 100 UNIT/ML SOPN INJECT 50 UNITS SUBCUTANEOUSLY AT BEDTIME (DISCONTINUE LEVEMIR) 45 mL 3 09/19/2019 at Unknown time  . irbesartan (AVAPRO) 300 MG tablet Take 1  tablet (300 mg total) by mouth daily. 90 tablet 3 09/20/2019  . Lancets (ONETOUCH ULTRASOFT) lancets USE AS DIRECTED TWICE DAILY 200 each 3 09/20/2019  . metoprolol tartrate (LOPRESSOR) 50 MG tablet TAKE ONE AND ONE-HALF (1 & 1/2) TABLETS BY MOUTH TWICE DAILY 21 tablet 0 09/20/2019 at 0900  . Multiple Vitamin (MULTIVITAMIN WITH MINERALS) TABS tablet Take 1 tablet by mouth daily.   09/20/2019  . pantoprazole (PROTONIX) 40 MG tablet TAKE 1 TABLET BY MOUTH 30 MINUTES BEFORE FIRST MEAL 90 tablet 3 09/20/2019  . polyethylene glycol (MIRALAX / GLYCOLAX) 17 g packet Take 17 g by mouth daily as needed.   09/20/2019  . glucose blood (ONE TOUCH ULTRA TEST) test strip USE AS INSTRUCTED FOR TWICE DAILY TESTING 200 each 3  at Unknown time  . Insulin Pen Needle (BD PEN NEEDLE MICRO U/F) 32G X 6 MM MISC USE TO INJECT INSULIN DAILY 100 each 3  at Unknown time   Scheduled:  . amLODipine  2.5 mg Oral Daily  . aspirin EC  81 mg Oral Daily  . atorvastatin  40 mg Oral QHS  . calcium-vitamin D  1 tablet Oral Q breakfast  . Chlorhexidine Gluconate Cloth  6 each Topical Daily  . glipiZIDE  5 mg Oral Daily  . insulin aspart  0-5 Units Subcutaneous QHS  . insulin aspart  0-9 Units Subcutaneous TID  WC  . insulin glargine  50 Units Subcutaneous Q2200  . irbesartan  300 mg Oral Daily  . metoprolol tartrate  75 mg Oral BID  . pantoprazole  40 mg Oral QAC breakfast  . sodium chloride flush  3 mL Intravenous Once   Infusions:  . heparin 1,000 Units/hr (09/21/19 0609)   PRN: acetaminophen **OR** acetaminophen, ALPRAZolam, nitroGLYCERIN, ondansetron (ZOFRAN) IV, polyethylene glycol Anti-infectives (From admission, onward)   None      Assessment: Kimberly Freeman a 78 y.o. female requires anticoagulation with a heparin iv infusion for the indication of  chest pain/ACS. Heparin gtt will be started following pharmacy protocol per pharmacy consult. Patient is not on previous oral anticoagulant that will require aPTT/HL  correlation before transitioning to only HL monitoring.   Heparin level this AM is supratherapeutic  At 1.1  Goal of Therapy:  Heparin level 0.3-0.7 units/ml Monitor platelets by anticoagulation protocol: Yes   Plan:  Hold heparin for 1 hour, then  Decrease heparin infusion at 750 units/hr Check anti-Xa level in 8 hours and daily while on heparin Continue to monitor H&H and platelets  Isac Sarna, BS Vena [place], BCPS Clinical Pharmacist Pager [phone] 09/21/2019,7:59 AM

## 2019-09-21 NOTE — Progress Notes (Signed)
[facility] transport team here to pick up pt. BG rechecked before leaving and came back 66. Notified Dr. Dyann Kief and informed him of [facility] wanting an order for D5 gtt. Will administer another amp of D50.

## 2019-09-21 NOTE — H&P (View-Only) (Signed)
Cardiology Consultation:   Patient ID: Kimberly Freeman; [id number]; [birth year]   Admit date: 09/20/2019 Date of Consult: 09/21/2019  Primary Care Provider: Kerri PerchesSimpson, Margaret E, MD Primary Cardiologist: Prentice DockerSuresh Koneswaran, MD Primary Electrophysiologist: None   Patient Profile:   Kimberly Freeman is an 78 y.o. female with a history of type 2 diabetes mellitus, hypertension, hyperlipidemia, and possibly remote atrial fibrillation who is being seen today for the evaluation of unstable angina at the request of Dr. Gwenlyn PerkingMadera.  History of Present Illness:   Kimberly Freeman presents to the hospital reporting recent onset of recurrent chest pressure that began about 1 week ago.  She states that symptoms feel like someone "standing" on her chest, she has also experienced dyspnea on exertion and fatigue, the symptoms occurred with low-level activity on the day of presentation concerning for unstable angina.  She does not report any recent palpitations, no lightheadedness or syncope.  ECG shows progressive ST segment abnormalities compared to prior tracing from 2017, as discussed below.  High-sensitivity troponin I is minimally increased at 23, she is chest pain-free on IV heparin.  She has no prior documented history of ischemic heart disease, negative Myoview as of 2017.  Chest x-ray reports no acute process.  Echocardiogram has been obtained and pending review.  Past Medical History:  Diagnosis Date  . Arthritis   . Diabetes mellitus, type 2 (HCC)   . Essential hypertension   . GERD (gastroesophageal reflux disease)   . Hyperlipidemia     Past Surgical History:  Procedure Laterality Date  . ABDOMINAL HYSTERECTOMY    . BIOPSY  07/18/2017   Procedure: BIOPSY;  Surgeon: West BaliFields, Sandi L, MD;  Location: [facility];  Service: Endoscopy;;  gastric  . CHOLECYSTECTOMY  2004  . COLONOSCOPY N/A 07/18/2017   Dr. Darrick Pennafields: Internal and external hemorrhoids.  No future screening/surveillance colonoscopies  due to age.  . ESOPHAGOGASTRODUODENOSCOPY N/A 07/18/2017   Dr. Darrick Pennafields: Small hiatal hernia, patchy mild inflammation characterized by congestion, erosions, erythema in the cardia, gastric body and antrum.  Diffuse moderate inflammation in the duodenal bulb and second portion duodenum.  Biopsies benign.  No H. pylori.     Inpatient Medications: Scheduled Meds: . amLODipine  2.5 mg Oral Daily  . aspirin EC  81 mg Oral Daily  . atorvastatin  40 mg Oral QHS  . calcium-vitamin D  1 tablet Oral Q breakfast  . Chlorhexidine Gluconate Cloth  6 each Topical Daily  . glipiZIDE  5 mg Oral Daily  . insulin aspart  0-5 Units Subcutaneous QHS  . insulin aspart  0-9 Units Subcutaneous TID WC  . insulin glargine  50 Units Subcutaneous Q2200  . irbesartan  300 mg Oral Daily  . metoprolol tartrate  75 mg Oral BID  . pantoprazole  40 mg Oral QAC breakfast  . sodium chloride flush  3 mL Intravenous Once   Continuous Infusions: . heparin     PRN Meds: acetaminophen **OR** acetaminophen, ALPRAZolam, nitroGLYCERIN, ondansetron (ZOFRAN) IV, polyethylene glycol  Allergies:    Allergies  Allergen Reactions  . Poison Oak Extract [Poison Oak Extract] Hives  . Sulfonamide Derivatives Itching    Burning sensation all over body    Social History:   Social History   Socioeconomic History  . Marital status: Married    Spouse name: [name]   . Number of children: 0  . Years of education: 12+  . Highest education level: 12th grade  Occupational History  . Occupation: retired   Chief Executive Officerocial  Needs  . Financial resource strain: Not hard at all  . Food insecurity    Worry: Never true    Inability: Never true  . Transportation needs    Medical: No    Non-medical: No  Tobacco Use  . Smoking status: Never Smoker  . Smokeless tobacco: Never Used  Substance and Sexual Activity  . Alcohol use: No    Alcohol/week: 0.0 standard drinks  . Drug use: No  . Sexual activity: Not Currently    Partners: Male   Lifestyle  . Physical activity    Days per week: 4 days    Minutes per session: 40 min  . Stress: Not at all  Relationships  . Social connections    Talks on phone: More than three times a week    Gets together: Once a week    Attends religious service: 1 to 4 times per year    Active member of club or organization: Yes    Attends meetings of clubs or organizations: More than 4 times per year    Relationship status: Married  . Intimate partner violence    Fear of current or ex partner: No    Emotionally abused: No    Physically abused: No    Forced sexual activity: No  Other Topics Concern  . Not on file  Social History Narrative   Lives alone with husband     Family History:   The patient's family history includes Cancer in her mother; Diabetes in her sister; Leukemia in her mother; Lung cancer (age of onset: 28) in her sister; Prostate cancer in her father. There is no history of Colon cancer.  ROS:  Please see the history of present illness.  Exertional fatigue.  All other ROS reviewed and negative.     Physical Exam/Data:   Vitals:   09/21/19 0400 09/21/19 0500 09/21/19 0600 09/21/19 0740  BP: (!) 141/59  (!) 152/67   Pulse: (!) 57  (!) 59 (!) 57  Resp: (!) 21  19 19   Temp: (!) 97.3 F (36.3 C)   97.9 F (36.6 C)  TempSrc: Oral   Oral  SpO2: 95%  99% 96%  Weight:  90.7 kg    Height:        Intake/Output Summary (Last 24 hours) at 09/21/2019 0928 Last data filed at 09/21/2019 09/23/2019 Gross per 24 hour  Intake 136.21 ml  Output 450 ml  Net -313.79 ml   Filed Weights   09/20/19 1953 09/21/19 0048 09/21/19 0500  Weight: 81.6 kg 90.7 kg 90.7 kg   Body mass index is 31.32 kg/m.   Gen: Elderly woman, no distress. HEENT: Conjunctiva and lids normal, oropharynx clear. Neck: Supple, no elevated JVP or carotid bruits, no thyromegaly. Lungs: Clear to auscultation, nonlabored breathing at rest. Cardiac: Regular rate and rhythm, no S3, 2/6 systolic murmur, no  pericardial rub. Abdomen: Soft, nontender, bowel sounds present, no guarding or rebound. Extremities: No pitting edema, distal pulses 2+. Skin: Warm and dry. Musculoskeletal: No kyphosis. Neuropsychiatric: Alert and oriented x3, affect grossly appropriate.  EKG:  An ECG dated 09/20/2019 was personally reviewed today and demonstrated:  Sinus rhythm with high lateral/lateral ST segment depression, ST elevation in aVR.  Telemetry:  I personally reviewed telemetry which shows sinus rhythm with bursts of PAT and aberrant PACs.  Relevant CV Studies:  Lexiscan Cardiolite 12/15/2015:  There was no ST segment deviation noted during stress.  The study is normal.  This is a low risk study.  Nuclear stress EF: 60%.  Laboratory Data:  Chemistry Recent Labs  Lab 09/20/19 2015  NA 140  K 3.9  CL 107  CO2 24  GLUCOSE 342*  BUN 21  CREATININE 1.42*  CALCIUM 9.0  GFRNONAA 35*  GFRAA 40*  ANIONGAP 9    No results for input(s): PROT, ALBUMIN, AST, ALT, ALKPHOS, BILITOT in the last 168 hours. Hematology Recent Labs  Lab 09/20/19 2015 09/21/19 0436  WBC 6.6 6.9  RBC 3.91 3.63*  HGB 11.9* 10.8*  HCT 37.7 35.0*  MCV 96.4 96.4  MCH 30.4 29.8  MCHC 31.6 30.9  RDW 12.6 12.5  PLT 198 178   Cardiac Enzymes Recent Labs  Lab 09/20/19 2015 09/20/19 2155  TROPONINIHS 9 23*    Radiology/Studies:  Dg Chest 2 View  Result Date: 09/20/2019 CLINICAL DATA:  Mid chest pain EXAM: CHEST - 2 VIEW COMPARISON:  03/25/2017 FINDINGS: Heart and mediastinal contours are within normal limits. No focal opacities or effusions. No acute bony abnormality. IMPRESSION: No active cardiopulmonary disease. Electronically Signed   By: Rolm Baptise M.D.   On: 09/20/2019 21:02    Assessment and Plan:   1.  Unstable angina, minor increase in high-sensitivity troponin I, but progressive ST segment abnormalities by ECG and TIMI risk score of 6.  Currently chest pain-free on IV heparin and hemodynamically  stable.  On aspirin, statin, Norvasc, Avapro and beta-blocker.  2.  Essential hypertension, currently on Norvasc, Avapro, and Lopressor.  3.  Type 2 diabetes mellitus, uncontrolled with hyperglycemia.  She is on Glucotrol XL patient, currently on sliding scale.  Hemoglobin A1c 7.5% as of October.  4.  Mixed hyperlipidemia, on Lipitor as an outpatient.  LDL 93 in May.  5.  Apparent remote history of atrial fibrillation approximately 20 years ago per chart review, not anticoagulated and without recent documented recurrences.   Chart reviewed and testing discussed with patient.  We have discussed the risks and benefits of proceeding to a diagnostic cardiac catheterization with eye toward revascularization, she is in agreement to proceed.  Continue current regimen with intensification of statin therapy. Contact made with our patient care coordinator at [facility] to facilitate transfer, SARS coronavirus 2 test is pending and bed status is limited however.  Patient last saw Dr. Bronson Ing back in 2016 and can be reestablished after hospital discharge.  Signed, Rozann Lesches, MD  09/21/2019 9:28 AM

## 2019-09-21 NOTE — Progress Notes (Signed)
Hypoglycemic Event  CBG: 59  Treatment: 1 amp D50 IV administered  Symptoms: no symptoms reported by pt  Follow-up CBG: Time: 1242 CBG Result: 155  Possible Reasons for Event: pt NPO for cardiac cath to be performed today.   Comments/MD notified: Dr. Dyann Kief notified.    Tereasa Coop

## 2019-09-21 NOTE — Progress Notes (Signed)
Inpatient Diabetes Program Recommendations  AACE/ADA: New Consensus Statement on Inpatient Glycemic Control  Target Ranges:  Prepandial:   less than 140 mg/dL      Peak postprandial:   less than 180 mg/dL (1-2 hours)      Critically ill patients:  140 - 180 mg/dL   Results for NYKIRA, REDDIX (MRN [id number]) as of 09/21/2019 09:05  Ref. Range 09/21/2019 01:08 09/21/2019 07:42  Glucose-Capillary Latest Ref Range: 70 - 99 mg/dL 182 (H) 63 (L)   Review of Glycemic Control  Diabetes history: DM2 Outpatient Diabetes medications: Glipizide XL 5 mg daily, Basaglar 50 units QHS Current orders for Inpatient glycemic control: Lantus 50 units QHS, Novolog 0-9 units TID with meals, Novolog 0-5 units QHS, Glipizide 5 mg daily  Inpatient Diabetes Program Recommendations:   Insulin - Basal: No Lantus given last night and fasting glucose 63 mg/dl today and patient is NPO. Please consider decreasing Lantus to 18 units QHS (based on 90.7 kg x 0.2 units).  Oral Agents: Please discontinue Glipizide while inpatient.  Thanks, Barnie Alderman, RN, MSN, CDE Diabetes Coordinator Inpatient Diabetes Program [phone] (Team Pager from 8am to 5pm)

## 2019-09-22 ENCOUNTER — Encounter (HOSPITAL_COMMUNITY): Payer: Self-pay | Admitting: Cardiovascular Disease

## 2019-09-22 DIAGNOSIS — I471 Supraventricular tachycardia: Secondary | ICD-10-CM

## 2019-09-22 DIAGNOSIS — I480 Paroxysmal atrial fibrillation: Secondary | ICD-10-CM

## 2019-09-22 DIAGNOSIS — I200 Unstable angina: Secondary | ICD-10-CM

## 2019-09-22 DIAGNOSIS — R079 Chest pain, unspecified: Secondary | ICD-10-CM

## 2019-09-22 LAB — CBC
HCT: 33.9 % — ABNORMAL LOW (ref 36.0–46.0)
Hemoglobin: 11 g/dL — ABNORMAL LOW (ref 12.0–15.0)
MCH: 30.1 pg (ref 26.0–34.0)
MCHC: 32.4 g/dL (ref 30.0–36.0)
MCV: 92.9 fL (ref 80.0–100.0)
Platelets: 185 10*3/uL (ref 150–400)
RBC: 3.65 MIL/uL — ABNORMAL LOW (ref 3.87–5.11)
RDW: 12.5 % (ref 11.5–15.5)
WBC: 6.3 10*3/uL (ref 4.0–10.5)
nRBC: 0 % (ref 0.0–0.2)

## 2019-09-22 LAB — BASIC METABOLIC PANEL
Anion gap: 12 (ref 5–15)
BUN: 11 mg/dL (ref 8–23)
CO2: 23 mmol/L (ref 22–32)
Calcium: 9.1 mg/dL (ref 8.9–10.3)
Chloride: 106 mmol/L (ref 98–111)
Creatinine, Ser: 0.92 mg/dL (ref 0.44–1.00)
GFR calc Af Amer: >60 mL/min (ref >60)
GFR calc non Af Amer: 59 mL/min — ABNORMAL LOW (ref >60)
Glucose, Bld: 116 mg/dL — ABNORMAL HIGH (ref 70–99)
Potassium: 3.9 mmol/L (ref 3.5–5.1)
Sodium: 141 mmol/L (ref 135–145)

## 2019-09-22 LAB — GLUCOSE, CAPILLARY
Glucose-Capillary: 100 mg/dL — ABNORMAL HIGH (ref 70–99)
Glucose-Capillary: 133 mg/dL — ABNORMAL HIGH (ref 70–99)

## 2019-09-22 MED ORDER — ATORVASTATIN CALCIUM 80 MG PO TABS: 80.0000 mg | ORAL_TABLET | Freq: Every day | ORAL | 0 refills | Status: DC

## 2019-09-22 MED ORDER — ASPIRIN 81 MG PO CHEW: 81.0000 mg | CHEWABLE_TABLET | Freq: Every day | ORAL | 0 refills | Status: AC

## 2019-09-22 NOTE — Plan of Care (Signed)
  Problem: Clinical Measurements: Goal: Will remain free from infection Outcome: Progressing Goal: Respiratory complications will improve Outcome: Progressing   Problem: Activity: Goal: Risk for activity intolerance will decrease Outcome: Progressing   Problem: Safety: Goal: Ability to remain free from injury will improve Outcome: Progressing   

## 2019-09-22 NOTE — Progress Notes (Signed)
Progress Note  Patient Name: Kimberly Freeman Date of Encounter: 09/22/2019  Primary Cardiologist: Prentice Docker, MD   Subjective   No further chest pain. Hoping to go home today.   Inpatient Medications    Scheduled Meds: . amLODipine  2.5 mg Oral Daily  . aspirin  81 mg Oral Daily  . atorvastatin  80 mg Oral q1800  . calcium-vitamin D  1 tablet Oral Q breakfast  . Chlorhexidine Gluconate Cloth  6 each Topical Daily  . insulin aspart  0-5 Units Subcutaneous QHS  . insulin aspart  0-9 Units Subcutaneous TID WC  . insulin glargine  18 Units Subcutaneous Q2200  . irbesartan  300 mg Oral Daily  . metoprolol tartrate  75 mg Oral BID  . pantoprazole  40 mg Oral QAC breakfast  . sodium chloride flush  3 mL Intravenous Q12H   Continuous Infusions: . sodium chloride    . sodium chloride 100 mL/hr at 09/22/19 0511  . dextrose 50 mL/hr at 09/21/19 1548   PRN Meds: sodium chloride, acetaminophen **OR** acetaminophen, acetaminophen, ALPRAZolam, diazepam, nitroGLYCERIN, ondansetron (ZOFRAN) IV, polyethylene glycol, sodium chloride flush   Vital Signs    Vitals:   09/22/19 0012 09/22/19 0053 09/22/19 0454 09/22/19 0726  BP: (!) 172/61 (!) 145/66 136/63 (!) 157/71  Pulse: 66 (!) 59 (!) 50 65  Resp:   18 18  Temp: 98.2 F (36.8 C)  98.5 F (36.9 C) 98.5 F (36.9 C)  TempSrc: Oral  Oral Oral  SpO2: 99%  97% 97%  Weight:      Height:        Intake/Output Summary (Last 24 hours) at 09/22/2019 0949 Last data filed at 09/22/2019 0813 Gross per 24 hour  Intake 2053.63 ml  Output 925 ml  Net 1128.63 ml   Last 3 Weights 09/22/2019 09/21/2019 09/21/2019  Weight (lbs) 199 lb 9.6 oz 199 lb 15.3 oz 199 lb 15.3 oz  Weight (kg) 90.538 kg 90.7 kg 90.7 kg      Telemetry    SR - Personally Reviewed  ECG    No new tracing this morning.  Physical Exam  Pleasant older AAF GEN: No acute distress.   Neck: No JVD Cardiac: RRR, no murmurs, rubs, or gallops.  Respiratory: Clear  to auscultation bilaterally. GI: Soft, nontender, non-distended  MS: No edema; No deformity. Right radial cath site stable.  Neuro:  Nonfocal  Psych: Normal affect   Labs    High Sensitivity Troponin:   Recent Labs  Lab 09/20/19 2015 09/20/19 2155  TROPONINIHS 9 23*      Chemistry Recent Labs  Lab 09/20/19 2015 09/21/19 1327 09/22/19 0511  NA 140 138 141  K 3.9 3.9 3.9  CL 107 105 106  CO2 24 26 23   GLUCOSE 342* 118* 116*  BUN 21 14 11   CREATININE 1.42* 1.03* 0.92  CALCIUM 9.0 9.1 9.1  GFRNONAA 35* 51* 59*  GFRAA 40* 59* >60  ANIONGAP 9 7 12      Hematology Recent Labs  Lab 09/20/19 2015 09/21/19 0436 09/22/19 0511  WBC 6.6 6.9 6.3  RBC 3.91 3.63* 3.65*  HGB 11.9* 10.8* 11.0*  HCT 37.7 35.0* 33.9*  MCV 96.4 96.4 92.9  MCH 30.4 29.8 30.1  MCHC 31.6 30.9 32.4  RDW 12.6 12.5 12.5  PLT 198 178 185    BNPNo results for input(s): BNP, PROBNP in the last 168 hours.   DDimer No results for input(s): DDIMER in the last 168 hours.  Radiology    Dg Chest 2 View  Result Date: 09/20/2019 CLINICAL DATA:  Mid chest pain EXAM: CHEST - 2 VIEW COMPARISON:  03/25/2017 FINDINGS: Heart and mediastinal contours are within normal limits. No focal opacities or effusions. No acute bony abnormality. IMPRESSION: No active cardiopulmonary disease. Electronically Signed   By: Rolm Baptise M.D.   On: 09/20/2019 21:02    Cardiac Studies   Cath: 09/21/19   Lat Ramus lesion is 60% stenosed.  Mid LAD lesion is 30% stenosed.  Prox RCA lesion is 20% stenosed.  Mid RCA lesion is 20% stenosed.   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  LV EDP 21 mm.  The echo Doppler study done earlier today showed vigorous LV contractility with an EF at 70 to 75% with severe LVH.  RECOMMENDATION: Initial medical therapy  trial.  Aggressive lipid-lowering therapy with target LDL less than 70.  Optimal blood pressure control with ideal blood pressure less than 120/80 with stage I hypertension commencing at 130/80.  Weight loss and exercise will be recommended.  Diagnostic Dominance: Right  TTE: 09/21/19  IMPRESSIONS    1. Left ventricular ejection fraction, by visual estimation, is 70 to 75%. The left ventricle has hyperdynamic function. There is severely increased left ventricular hypertrophy.  2. Left ventricular diastolic parameters are consistent with Grade I diastolic dysfunction (impaired relaxation).  3. Global right ventricle has normal systolic function.The right ventricular size is normal. Mildly increased right ventricular wall thickness.  4. Left atrial size was mild-moderately dilated.  5. Right atrial size was normal.  6. Mild aortic valve annular calcification.  7. Mild mitral annular calcification.  8. The mitral valve is grossly normal. Trace mitral valve regurgitation.  9. The tricuspid valve is grossly normal. Tricuspid valve regurgitation is mild. 10. The aortic valve is tricuspid. Aortic valve regurgitation is trivial. Moderately sclerotic to mildly stenotic aortic valve. 11. The pulmonic valve was not well visualized. Pulmonic valve regurgitation is trivial. 12. TR signal is inadequate for assessing pulmonary artery systolic pressure. 13. The inferior vena cava is dilated in size with >50% respiratory variability, suggesting right atrial pressure of 8 mmHg.  Patient Profile     78 y.o. female with a history of type 2 diabetes mellitus, hypertension, hyperlipidemia, and possibly remote atrial fibrillation who was seen for the evaluation of unstable angina at the request of Dr. Dyann Kief. Evaluated by Dr. Domenic Polite and transferred to [facility] for further management.   Assessment & Plan    1. Chest pain: presented to [facility] with symptoms and noted to have very low level troponin of 23. EKG  showed progressive ST changes and transferred to [facility]. Underwent cardiac cath yesterday noted above with mild nonobstructive CAD. Recommendation to continue with medical therapy. Echo showed EF of 70% with G1DD. -- on ASA, statin, norvasc, Avapro and BB therapy  2. HTN: borderline controlled. Will continue with current regimen  3. DM: Hgb A1c 7.5 back in October. Management per IM.   4. HL: on Lipitor . LDL 93 back in May  5. Remote Hx of PAF: reported over 20 years prior. Not anticoagulated and no documented recurrences since that time.    For questions or updates, please contact [place] Please consult [email or web address] for contact info under   Signed, Reino Bellis, NP  09/22/2019, 9:49 AM

## 2019-09-22 NOTE — Discharge Instructions (Signed)
[place] are in the hospital because of chest pain.  He had an extensive work-up which was significant for some mild to moderate heart disease.  Medications have been adjusted including increasing her Lipitor to 80 mg daily and adjusting her aspirin to 81 mg daily.  Please follow-up with your cardiologist and your PCP.

## 2019-09-22 NOTE — Discharge Summary (Signed)
Physician Discharge Summary  GABRYELLE WHITMOYER WUJ:[id number] DOB: [birth year] DOA: 09/20/2019  PCP: [name], MD  Admit date: 09/20/2019 Discharge date: 09/22/2019  Admitted From: Home Disposition: Home  Recommendations for Outpatient Follow-up:  1. Follow up with PCP in 1 week 2. Follow up with cardiology 3. Please obtain BMP/CBC in one week 4. Please follow up on the following pending results: None  Home Health: None Equipment/Devices: None  Discharge Condition: Stable CODE STATUS: Full code Diet recommendation: Heart healthy   Brief/Interim Summary:  Admission HPI written by Sanda Klein, MD   Chief Complaint: Chest pain.  HPI: Kimberly Freeman is a 78 y.o. female with medical history significant of osteoarthritis, type 2 diabetes, hypertension, GERD, hyperlipidemia, remote history of atrial fibrillation about 20 years ago off of anticoagulation for a number of years now who is coming to the emergency department with complaints of precordial chest pain, pressure-like, nonradiated, associated with dyspnea, palpitations and diaphoresis.  She denies dizziness, nausea or emesis, but states that she has had some mild lower extremity edema.  She denies fever, chills, sore throat or rhinorrhea.  No wheezing or productive cough.  Denies abdominal pain, diarrhea,  melena or hematochezia.  She has occasional constipation.  No dysuria, frequency or hematuria.  No polyuria, polydipsia, polyphagia or blurred vision.  ED Course: Initial vital signs temperature 97.8 F, pulse 77, respiration 18, blood pressure 181/88 mmHg and O2 sat 96% on room air.  The patient had EKG changes and cardiology at [facility] was consulted.  They had no beds and recommended admission with cardiology consult at this facility.  She was started on an insulin infusion.  Her CBC showed a hemoglobin 11.9 g/dL, but was otherwise normal.  Troponin was 9 then 23 ng/L.  Her BMP showed a glucose of 342 and a creatinine  of 1.42 mg/dL.  BUN and her electrolytes were normal.  Her chest radiograph was within normal limits.    Hospital course:  Unstable angina Patient with chest pain in addition to EKG with evidence of ST changes. Patient managed on heparin drip. Transthoracic Echocardiogram significant for an EF of 70-75% with grade 1 diastolic dysfunction. Cardiology consulted and patient underwent left heart catheterization which was significant for mild-moderate disease (report below). Cardiology recommending medication changes of aspirin 81 mg, Lipitor 80 mg on discharge.  Diabetes mellitus, type 2 Continue glipizide  CKD stage 3b Stable  Hyperlipidemia Lipitor increased to 80 mg daily  GERD Continue Protonix  Essential hypertension Continue metoprolol, amlodipine, irbesartan.  Paroxysmal atrial fibrillation Some evidence of non-symptomatic bradycardia in addition to tachycardia with PACs. Evaluated by cardiology. Continue metoprolol. Outpatient follow-up.  Discharge Diagnoses:  Principal Problem:   Unstable angina (HCC) Active Problems:   Type 2 diabetes mellitus with diabetic chronic kidney disease (HCC)   Hyperlipidemia LDL goal <100   Esophageal reflux   Chest pain of uncertain etiology   Essential hypertension   Paroxysmal atrial fibrillation (HCC)   PAT (paroxysmal atrial tachycardia) (HCC)    Discharge Instructions  Discharge Instructions    Diet - low sodium heart healthy   Complete by: As directed    Increase activity slowly   Complete by: As directed      Allergies as of 09/22/2019      Reactions   Poison Oak Extract [poison Oak Extract] Hives   Sulfonamide Derivatives Itching   Burning sensation all over body      Medication List    STOP taking these medications  aspirin 325 MG tablet Replaced by: aspirin 81 MG chewable tablet     TAKE these medications   acetaminophen 500 MG tablet Commonly known as: TYLENOL Take 500 mg by mouth daily as needed for mild  pain or moderate pain. For pain   amLODipine 2.5 MG tablet Commonly known as: NORVASC Take 1 tablet (2.5 mg total) by mouth daily.   aspirin 81 MG chewable tablet Chew 1 tablet (81 mg total) by mouth daily. Start taking on: September 23, 2019 Replaces: aspirin 325 MG tablet   atorvastatin 80 MG tablet Commonly known as: LIPITOR Take 1 tablet (80 mg total) by mouth daily at 6 PM. What changed:   medication strength  how much to take  when to take this   Basaglar KwikPen 100 UNIT/ML Sopn INJECT 50 UNITS SUBCUTANEOUSLY AT BEDTIME (DISCONTINUE LEVEMIR)   BD Pen Needle Micro U/F 32G X 6 MM Misc Generic drug: Insulin Pen Needle USE TO INJECT INSULIN DAILY   calcium-vitamin D 500-200 MG-UNIT tablet Commonly known as: OSCAL WITH D Take 1 tablet by mouth.   glipiZIDE 5 MG 24 hr tablet Commonly known as: GLUCOTROL XL Take 1 tablet (5 mg total) by mouth daily.   glucose blood test strip Commonly known as: ONE TOUCH ULTRA TEST USE AS INSTRUCTED FOR TWICE DAILY TESTING   irbesartan 300 MG tablet Commonly known as: AVAPRO Take 1 tablet (300 mg total) by mouth daily.   metoprolol tartrate 50 MG tablet Commonly known as: LOPRESSOR TAKE ONE AND ONE-HALF (1 & 1/2) TABLETS BY MOUTH TWICE DAILY   multivitamin with minerals Tabs tablet Take 1 tablet by mouth daily.   onetouch ultrasoft lancets USE AS DIRECTED TWICE DAILY   pantoprazole 40 MG tablet Commonly known as: PROTONIX TAKE 1 TABLET BY MOUTH 30 MINUTES BEFORE FIRST MEAL   polyethylene glycol 17 g packet Commonly known as: MIRALAX / GLYCOLAX Take 17 g by mouth daily as needed.      Follow-up Information    [name], MD. Schedule an appointment as soon as possible for a visit in 1 week(s).   Specialty: Family Medicine Why: Hospital follow-up Contact information: [address] [place] [address] [phone]        Laqueta Linden, MD. Schedule an appointment as soon as possible  for a visit.   Specialty: Cardiology Why: Hospital follow-up Contact information: [address] [place] [address] [phone]          Allergies  Allergen Reactions  . Poison Oak Extract [Poison Oak Extract] Hives  . Sulfonamide Derivatives Itching    Burning sensation all over body    Consultations:  Cardiology   Procedures/Studies: Dg Chest 2 View  Result Date: 09/20/2019 CLINICAL DATA:  Mid chest pain EXAM: CHEST - 2 VIEW COMPARISON:  03/25/2017 FINDINGS: Heart and mediastinal contours are within normal limits. No focal opacities or effusions. No acute bony abnormality. IMPRESSION: No active cardiopulmonary disease. Electronically Signed   By: Charlett Nose M.D.   On: 09/20/2019 21:02     11/30: Transthoracic Echocardiogram IMPRESSIONS    1. Left ventricular ejection fraction, by visual estimation, is 70 to 75%. The left ventricle has hyperdynamic function. There is severely increased left ventricular hypertrophy.  2. Left ventricular diastolic parameters are consistent with Grade I diastolic dysfunction (impaired relaxation).  3. Global right ventricle has normal systolic function.The right ventricular size is normal. Mildly increased right ventricular wall thickness.  4. Left atrial size was mild-moderately dilated.  5. Right atrial size was normal.  6. Mild aortic valve annular calcification.  7. Mild mitral annular calcification.  8. The mitral valve is grossly normal. Trace mitral valve regurgitation.  9. The tricuspid valve is grossly normal. Tricuspid valve regurgitation is mild. 10. The aortic valve is tricuspid. Aortic valve regurgitation is trivial. Moderately sclerotic to mildly stenotic aortic valve. 11. The pulmonic valve was not well visualized. Pulmonic valve regurgitation is trivial. 12. TR signal is inadequate for assessing pulmonary artery systolic pressure. 13. The inferior vena cava is dilated in size with >50% respiratory variability,  suggesting right atrial pressure of 8 mmHg.  11/30: Left heart catheterization Conclusion    Lat Ramus lesion is 60% stenosed.  Mid LAD lesion is 30% stenosed.  Prox RCA lesion is 20% stenosed.  Mid RCA lesion is 20% stenosed.   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  LV EDP 21 mm.  The echo Doppler study done earlier today showed vigorous LV contractility with an EF at 70 to 75% with severe LVH.  RECOMMENDATION: Initial medical therapy trial.  Aggressive lipid-lowering therapy with target LDL less than 70.  Optimal blood pressure control with ideal blood pressure less than 120/80 with stage I hypertension commencing at 130/80.  Weight loss and exercise will be recommended.    Subjective: No chest pain or dyspnea  Discharge Exam: Vitals:   09/22/19 0454 09/22/19 0726  BP: 136/63 (!) 157/71  Pulse: (!) 50 65  Resp: 18 18  Temp: 98.5 F (36.9 C) 98.5 F (36.9 C)  SpO2: 97% 97%   Vitals:   09/22/19 0012 09/22/19 0053 09/22/19 0454 09/22/19 0726  BP: (!) 172/61 (!) 145/66 136/63 (!) 157/71  Pulse: 66 (!) 59 (!) 50 65  Resp:   18 18  Temp: 98.2 F (36.8 C)  98.5 F (36.9 C) 98.5 F (36.9 C)  TempSrc: Oral  Oral Oral  SpO2: 99%  97% 97%  Weight:      Height:        General: Pt is alert, awake, not in acute distress Cardiovascular: RRR, S1/S2 +, no rubs, no gallops Respiratory: CTA bilaterally, no wheezing, no rhonchi Abdominal: Soft, NT, ND, bowel sounds + Extremities: no edema, no cyanosis    The results of significant diagnostics from this hospitalization (including imaging, microbiology, ancillary and laboratory) are listed below for reference.     Microbiology: Recent Results (from the past 240 hour(s))  SARS Coronavirus 2 by RT PCR (hospital order, performed in [facility] hospital lab)  Nasopharyngeal Nasopharyngeal Swab     Status: None   Collection Time: 09/21/19 12:24 AM   Specimen: Nasopharyngeal Swab  Result Value Ref Range Status   SARS Coronavirus 2 NEGATIVE NEGATIVE Final    Comment: (NOTE) SARS-CoV-2 target nucleic acids are NOT DETECTED. The SARS-CoV-2 RNA is generally detectable in upper and lower respiratory specimens during the acute phase of infection. The lowest concentration of SARS-CoV-2 viral copies this assay can detect is 250 copies / mL. A negative result does not preclude SARS-CoV-2 infection and should not be used as the sole basis for treatment or other patient management decisions.  A negative result may occur with improper specimen collection / handling, submission of specimen other than nasopharyngeal swab, presence of viral mutation(s) within the areas targeted by this assay, and inadequate  number of viral copies (<250 copies / mL). A negative result must be combined with clinical observations, patient history, and epidemiological information. Fact Sheet for Patients:   StrictlyIdeas.no Fact Sheet for Healthcare Providers: BankingDealers.co.za This test is not yet approved or cleared  by the [place] FDA and has been authorized for detection and/or diagnosis of SARS-CoV-2 by FDA under an Emergency Use Authorization (EUA).  This EUA will remain in effect (meaning this test can be used) for the duration of the COVID-19 declaration under Section 564(b)(1) of the Act, 21 U.S.C. section 360bbb-3(b)(1), unless the authorization is terminated or revoked sooner. Performed at [facility], [address] [place], [place] [address]   MRSA PCR Screening     Status: None   Collection Time: 09/21/19 12:47 AM   Specimen: Nasal Mucosa; Nasopharyngeal  Result Value Ref Range Status   MRSA by PCR NEGATIVE NEGATIVE Final    Comment:        The GeneXpert MRSA Assay (FDA approved for NASAL specimens  only), is one component of a comprehensive MRSA colonization surveillance program. It is not intended to diagnose MRSA infection nor to guide or monitor treatment for MRSA infections. Performed at [facility], [address] [place],  [address]      Labs: BNP (last 3 results) No results for input(s): BNP in the last 8760 hours. Basic Metabolic Panel: Recent Labs  Lab 09/20/19 2015 09/21/19 0436 09/21/19 1327 09/22/19 0511  NA 140  --  138 141  K 3.9  --  3.9 3.9  CL 107  --  105 106  CO2 24  --  26 23  GLUCOSE 342*  --  118* 116*  BUN 21  --  14 11  CREATININE 1.42*  --  1.03* 0.92  CALCIUM 9.0  --  9.1 9.1  MG  --  1.7  --   --   PHOS  --  3.2  --   --    Liver Function Tests: No results for input(s): AST, ALT, ALKPHOS, BILITOT, PROT, ALBUMIN in the last 168 hours. No results for input(s): LIPASE, AMYLASE in the last 168 hours. No results for input(s): AMMONIA in the last 168 hours. CBC: Recent Labs  Lab 09/20/19 2015 09/21/19 0436 09/22/19 0511  WBC 6.6 6.9 6.3  HGB 11.9* 10.8* 11.0*  HCT 37.7 35.0* 33.9*  MCV 96.4 96.4 92.9  PLT 198 178 185   Cardiac Enzymes: No results for input(s): CKTOTAL, CKMB, CKMBINDEX, TROPONINI in the last 168 hours. BNP: Invalid input(s): POCBNP CBG: Recent Labs  Lab 09/21/19 1242 09/21/19 1533 09/21/19 1655 09/21/19 2107 09/22/19 0600  GLUCAP 155* 66* 145* 151* 100*   D-Dimer No results for input(s): DDIMER in the last 72 hours. Hgb A1c No results for input(s): HGBA1C in the last 72 hours. Lipid Profile No results for input(s): CHOL, HDL, LDLCALC, TRIG, CHOLHDL, LDLDIRECT in the last 72 hours. Thyroid function studies No results for input(s): TSH, T4TOTAL, T3FREE, THYROIDAB in the last 72 hours.  Invalid input(s): FREET3 Anemia work up No results for input(s): VITAMINB12, FOLATE, FERRITIN, TIBC, IRON, RETICCTPCT in the last 72 hours. Urinalysis    Component Value Date/Time   BILIRUBINUR neg 08/29/2012  1559   PROTEINUR neg 08/29/2012 1559   UROBILINOGEN 0.2 08/29/2012 1559   NITRITE neg 08/29/2012 1559   LEUKOCYTESUR Negative 08/29/2012 1559   Sepsis Labs Invalid input(s): PROCALCITONIN,  WBC,  LACTICIDVEN Microbiology Recent Results (from the past 240 hour(s))  SARS Coronavirus 2 by RT  PCR (hospital order, performed in [facility] hospital lab) Nasopharyngeal Nasopharyngeal Swab     Status: None   Collection Time: 09/21/19 12:24 AM   Specimen: Nasopharyngeal Swab  Result Value Ref Range Status   SARS Coronavirus 2 NEGATIVE NEGATIVE Final    Comment: (NOTE) SARS-CoV-2 target nucleic acids are NOT DETECTED. The SARS-CoV-2 RNA is generally detectable in upper and lower respiratory specimens during the acute phase of infection. The lowest concentration of SARS-CoV-2 viral copies this assay can detect is 250 copies / mL. A negative result does not preclude SARS-CoV-2 infection and should not be used as the sole basis for treatment or other patient management decisions.  A negative result may occur with improper specimen collection / handling, submission of specimen other than nasopharyngeal swab, presence of viral mutation(s) within the areas targeted by this assay, and inadequate number of viral copies (<250 copies / mL). A negative result must be combined with clinical observations, patient history, and epidemiological information. Fact Sheet for Patients:   BoilerBrush.com.cy Fact Sheet for Healthcare Providers: https://pope.com/ This test is not yet approved or cleared  by the [place] FDA and has been authorized for detection and/or diagnosis of SARS-CoV-2 by FDA under an Emergency Use Authorization (EUA).  This EUA will remain in effect (meaning this test can be used) for the duration of the COVID-19 declaration under Section 564(b)(1) of the Act, 21 U.S.C. section 360bbb-3(b)(1), unless the authorization is terminated or  revoked sooner. Performed at [facility], [address], [place], [place] [address]   MRSA PCR Screening     Status: None   Collection Time: 09/21/19 12:47 AM   Specimen: Nasal Mucosa; Nasopharyngeal  Result Value Ref Range Status   MRSA by PCR NEGATIVE NEGATIVE Final    Comment:        The GeneXpert MRSA Assay (FDA approved for NASAL specimens only), is one component of a comprehensive MRSA colonization surveillance program. It is not intended to diagnose MRSA infection nor to guide or monitor treatment for MRSA infections. Performed at [facility], [address] [place], [place] [address]      Time coordinating discharge: 35 minutes  SIGNED:   Jacquelin Hawking, MD [facility] 09/22/2019, 12:29 PM

## 2019-09-23 ENCOUNTER — Telehealth: Payer: Self-pay

## 2019-09-23 NOTE — Telephone Encounter (Signed)
Transition Care Management Follow-up Telephone Call   Date discharged?  09/22/2019              How have you been since you were released from the hospital? pretty good   Do you understand why you were in the hospital? Chest pain/heaviness in chest   Do you understand the discharge instructions? yes   Where were you discharged to? home   Items Reviewed:  Medications reviewed: yes  Allergies reviewed: yes  Dietary changes reviewed: yes  Referrals reviewed: no new referrals   Functional Questionnaire:   Activities of Daily Living (ADLs):  not yet. Taking it easy for a few days    Any transportation issues/concerns?: no   Any patient concerns? no   Confirmed importance and date/time of follow-up visits scheduled 10/01/19 at 10:40am     Confirmed with patient if condition begins to worsen call PCP or go to the ER.  Patient was given the office number and encouraged to call back with question or concerns. Yes with verbal understanding

## 2019-09-24 ENCOUNTER — Encounter: Payer: Self-pay | Admitting: Family Medicine

## 2019-09-24 ENCOUNTER — Other Ambulatory Visit: Payer: Self-pay

## 2019-09-24 ENCOUNTER — Ambulatory Visit (INDEPENDENT_AMBULATORY_CARE_PROVIDER_SITE_OTHER): Payer: Medicare Other | Admitting: Family Medicine

## 2019-09-24 ENCOUNTER — Ambulatory Visit: Payer: Medicare Other

## 2019-09-24 VITALS — BP 127/63 | HR 62 | Resp 15 | Ht 67.0 in | Wt 198.0 lb

## 2019-09-24 DIAGNOSIS — Z0000 Encounter for general adult medical examination without abnormal findings: Secondary | ICD-10-CM

## 2019-09-24 NOTE — Patient Instructions (Signed)
Kimberly Freeman , Thank you for taking time to come for your Medicare Wellness Visit. I appreciate your ongoing commitment to your health goals. Please review the following plan we discussed and let me know if I can assist you in the future.   Please continue to practice social distancing to keep you, your family, and our community safe.  If you must go out, please wear a Mask and practice good handwashing.  Have a happy, safe, healthy holiday season!  Screening recommendations/referrals: Colonoscopy: No longer needed Mammogram: Up-to-date  bone Density: Up-to-date Recommended yearly ophthalmology/optometry visit for glaucoma screening and checkup Recommended yearly dental visit for hygiene and checkup  Vaccinations: Influenza vaccine: Up-to-date Pneumococcal vaccine: Completed Tdap vaccine: Up-to-date Shingles vaccine: Completed  Advanced directives: Please ask for paperwork at next visit   Conditions/risks identified: Falls, Diabetes    Next appointment: 10/01/2019   Preventive Care 33 Years and Older, Female Preventive care refers to lifestyle choices and visits with your health care provider that can promote health and wellness. What does preventive care include?  A yearly physical exam. This is also called an annual well check.  Dental exams once or twice a year.  Routine eye exams. Ask your health care provider how often you should have your eyes checked.  Personal lifestyle choices, including:  Daily care of your teeth and gums.  Regular physical activity.  Eating a healthy diet.  Avoiding tobacco and drug use.  Limiting alcohol use.  Practicing safe sex.  Taking low-dose aspirin every day.  Taking vitamin and mineral supplements as recommended by your health care provider. What happens during an annual well check? The services and screenings done by your health care provider during your annual well check will depend on your age, overall health, lifestyle  risk factors, and family history of disease. Counseling  Your health care provider may ask you questions about your:  Alcohol use.  Tobacco use.  Drug use.  Emotional well-being.  Home and relationship well-being.  Sexual activity.  Eating habits.  History of falls.  Memory and ability to understand (cognition).  Work and work Statistician.  Reproductive health. Screening  You may have the following tests or measurements:  Height, weight, and BMI.  Blood pressure.  Lipid and cholesterol levels. These may be checked every 5 years, or more frequently if you are over 33 years old.  Skin check.  Lung cancer screening. You may have this screening every year starting at age 54 if you have a 30-pack-year history of smoking and currently smoke or have quit within the past 15 years.  Fecal occult blood test (FOBT) of the stool. You may have this test every year starting at age 18.  Flexible sigmoidoscopy or colonoscopy. You may have a sigmoidoscopy every 5 years or a colonoscopy every 10 years starting at age 32.  Hepatitis C blood test.  Hepatitis B blood test.  Sexually transmitted disease (STD) testing.  Diabetes screening. This is done by checking your blood sugar (glucose) after you have not eaten for a while (fasting). You may have this done every 1-3 years.  Bone density scan. This is done to screen for osteoporosis. You may have this done starting at age 55.  Mammogram. This may be done every 1-2 years. Talk to your health care provider about how often you should have regular mammograms. Talk with your health care provider about your test results, treatment options, and if necessary, the need for more tests. Vaccines  Your health care provider  may recommend certain vaccines, such as:  Influenza vaccine. This is recommended every year.  Tetanus, diphtheria, and acellular pertussis (Tdap, Td) vaccine. You may need a Td booster every 10 years.  Zoster vaccine.  You may need this after age 18.  Pneumococcal 13-valent conjugate (PCV13) vaccine. One dose is recommended after age 23.  Pneumococcal polysaccharide (PPSV23) vaccine. One dose is recommended after age 73. Talk to your health care provider about which screenings and vaccines you need and how often you need them. This information is not intended to replace advice given to you by your health care provider. Make sure you discuss any questions you have with your health care provider. Document Released: 11/04/2015 Document Revised: 06/27/2016 Document Reviewed: 08/09/2015 Elsevier Interactive Patient Education  2017 [place] Prevention in the Home Falls can cause injuries. They can happen to people of all ages. There are many things you can do to make your home safe and to help prevent falls. What can I do on the outside of my home?  Regularly fix the edges of walkways and driveways and fix any cracks.  Remove anything that might make you trip as you walk through a door, such as a raised step or threshold.  Trim any bushes or trees on the path to your home.  Use bright outdoor lighting.  Clear any walking paths of anything that might make someone trip, such as rocks or tools.  Regularly check to see if handrails are loose or broken. Make sure that both sides of any steps have handrails.  Any raised decks and porches should have guardrails on the edges.  Have any leaves, snow, or ice cleared regularly.  Use sand or salt on walking paths during winter.  Clean up any spills in your garage right away. This includes oil or grease spills. What can I do in the bathroom?  Use night lights.  Install grab bars by the toilet and in the tub and shower. Do not use towel bars as grab bars.  Use non-skid mats or decals in the tub or shower.  If you need to sit down in the shower, use a plastic, non-slip stool.  Keep the floor dry. Clean up any water that spills on the floor as soon  as it happens.  Remove soap buildup in the tub or shower regularly.  Attach bath mats securely with double-sided non-slip rug tape.  Do not have throw rugs and other things on the floor that can make you trip. What can I do in the bedroom?  Use night lights.  Make sure that you have a light by your bed that is easy to reach.  Do not use any sheets or blankets that are too big for your bed. They should not hang down onto the floor.  Have a firm chair that has side arms. You can use this for support while you get dressed.  Do not have throw rugs and other things on the floor that can make you trip. What can I do in the kitchen?  Clean up any spills right away.  Avoid walking on wet floors.  Keep items that you use a lot in easy-to-reach places.  If you need to reach something above you, use a strong step stool that has a grab bar.  Keep electrical cords out of the way.  Do not use floor polish or wax that makes floors slippery. If you must use wax, use non-skid floor wax.  Do not have throw rugs  and other things on the floor that can make you trip. What can I do with my stairs?  Do not leave any items on the stairs.  Make sure that there are handrails on both sides of the stairs and use them. Fix handrails that are broken or loose. Make sure that handrails are as long as the stairways.  Check any carpeting to make sure that it is firmly attached to the stairs. Fix any carpet that is loose or worn.  Avoid having throw rugs at the top or bottom of the stairs. If you do have throw rugs, attach them to the floor with carpet tape.  Make sure that you have a light switch at the top of the stairs and the bottom of the stairs. If you do not have them, ask someone to add them for you. What else can I do to help prevent falls?  Wear shoes that:  Do not have high heels.  Have rubber bottoms.  Are comfortable and fit you well.  Are closed at the toe. Do not wear sandals.  If  you use a stepladder:  Make sure that it is fully opened. Do not climb a closed stepladder.  Make sure that both sides of the stepladder are locked into place.  Ask someone to hold it for you, if possible.  Clearly mark and make sure that you can see:  Any grab bars or handrails.  First and last steps.  Where the edge of each step is.  Use tools that help you move around (mobility aids) if they are needed. These include:  Canes.  Walkers.  Scooters.  Crutches.  Turn on the lights when you go into a dark area. Replace any light bulbs as soon as they burn out.  Set up your furniture so you have a clear path. Avoid moving your furniture around.  If any of your floors are uneven, fix them.  If there are any pets around you, be aware of where they are.  Review your medicines with your doctor. Some medicines can make you feel dizzy. This can increase your chance of falling. Ask your doctor what other things that you can do to help prevent falls. This information is not intended to replace advice given to you by your health care provider. Make sure you discuss any questions you have with your health care provider. Document Released: 08/04/2009 Document Revised: 03/15/2016 Document Reviewed: 11/12/2014 Elsevier Interactive Patient Education  2017 [employer].

## 2019-09-24 NOTE — Progress Notes (Signed)
Subjective:   Kimberly Freeman is a 78 y.o. female who presents for Medicare Annual (Subsequent) preventive examination.  Location of Patient: Home Location of Provider: Telehealth Consent was obtain for visit to be over via telehealth.  I verified that I am speaking with the correct person using two identifiers.   Review of Systems:    Cardiac Risk Factors include: advanced age (>32men, >71 women);diabetes mellitus;dyslipidemia;hypertension;obesity (BMI >30kg/m2)     Objective:     Vitals: BP 127/63   Pulse 62   Resp 15   Ht 5\' 7"  (1.702 m)   Wt 198 lb (89.8 kg)   BMI 31.01 kg/m   Body mass index is 31.01 kg/m.  Advanced Directives 09/21/2019 09/20/2019 08/20/2019 12/08/2018 09/22/2018 06/04/2018 11/28/2017  Does Patient Have a Medical Advance Directive? No No No No No No No  Type of Advance Directive - - - - - - -  Does patient want to make changes to medical advance directive? - - - - - - -  Copy of [place] in Chart? - - - - - - -  Would patient like information on creating a medical advance directive? No - Patient declined - No - Patient declined - No - Patient declined No - Patient declined No - Patient declined    Tobacco Social History   Tobacco Use  Smoking Status Never Smoker  Smokeless Tobacco Never Used     Counseling given: Yes   Clinical Intake:  Pre-visit preparation completed: Yes  Pain : No/denies pain Pain Score: 0-No pain     BMI - recorded: 31.01 Nutritional Status: BMI > 30  Obese Nutritional Risks: None Diabetes: Yes CBG done?: No Did pt. bring in CBG monitor from home?: No  How often do you need to have someone help you when you read instructions, pamphlets, or other written materials from your doctor or pharmacy?: 1 - Never What is the last grade level you completed in school?: 2 years of college + 2 years of business school  Interpreter Needed?: No     Past Medical History:  Diagnosis Date  . Arthritis    . Diabetes mellitus, type 2 ([place])   . Essential hypertension   . GERD (gastroesophageal reflux disease)   . Hyperlipidemia    Past Surgical History:  Procedure Laterality Date  . ABDOMINAL HYSTERECTOMY    . BIOPSY  07/18/2017   Procedure: BIOPSY;  Surgeon: Danie Binder, MD;  [facility];  Service: Endoscopy;;  gastric  . CHOLECYSTECTOMY  2004  . COLONOSCOPY N/A 07/18/2017   Dr. Oneida Alar: Internal and external hemorrhoids.  No future screening/surveillance colonoscopies due to age.  . ESOPHAGOGASTRODUODENOSCOPY N/A 07/18/2017   Dr. Oneida Alar: Small hiatal hernia, patchy mild inflammation characterized by congestion, erosions, erythema in the cardia, gastric body and antrum.  Diffuse moderate inflammation in the duodenal bulb and second portion duodenum.  Biopsies benign.  No H. pylori.  Marland Kitchen LEFT HEART CATH AND CORONARY ANGIOGRAPHY N/A 09/21/2019   Procedure: LEFT HEART CATH AND CORONARY ANGIOGRAPHY;  Surgeon: Troy Sine, MD;  Location: [facility];  Service: Cardiovascular;  Laterality: N/A;   Family History  Problem Relation Age of Onset  . Leukemia Mother   . Cancer Mother   . Prostate cancer Father   . Lung cancer Sister 31  . Diabetes Sister   . Colon cancer Neg Hx    Social History   Socioeconomic History  . Marital status: Married  Spouse name: [name]   . Number of children: 0  . Years of education: 12+  . Highest education level: 12th grade  Occupational History  . Occupation: retired   Engineer, productionocial Needs  . Financial resource strain: Not hard at all  . Food insecurity    Worry: Never true    Inability: Never true  . Transportation needs    Medical: No    Non-medical: No  Tobacco Use  . Smoking status: Never Smoker  . Smokeless tobacco: Never Used  Substance and Sexual Activity  . Alcohol use: No    Alcohol/week: 0.0 standard drinks  . Drug use: No  . Sexual activity: Not Currently    Partners: Male  Lifestyle  . Physical activity    Days  per week: 4 days    Minutes per session: 40 min  . Stress: Not at all  Relationships  . Social connections    Talks on phone: More than three times a week    Gets together: Once a week    Attends religious service: 1 to 4 times per year    Active member of club or organization: Yes    Attends meetings of clubs or organizations: More than 4 times per year    Relationship status: Married  Other Topics Concern  . Not on file  Social History Narrative   Lives alone with husband     Outpatient Encounter Medications as of 09/24/2019  Medication Sig  . acetaminophen (TYLENOL) 500 MG tablet Take 500 mg by mouth daily as needed for mild pain or moderate pain. For pain   . amLODipine (NORVASC) 2.5 MG tablet Take 1 tablet (2.5 mg total) by mouth daily.  Marland Kitchen. aspirin 81 MG chewable tablet Chew 1 tablet (81 mg total) by mouth daily.  Marland Kitchen. atorvastatin (LIPITOR) 80 MG tablet Take 1 tablet (80 mg total) by mouth daily at 6 PM.  . calcium-vitamin D (OSCAL WITH D) 500-200 MG-UNIT tablet Take 1 tablet by mouth.  Marland Kitchen. glipiZIDE (GLUCOTROL XL) 5 MG 24 hr tablet Take 1 tablet (5 mg total) by mouth daily.  Marland Kitchen. glucose blood (ONE TOUCH ULTRA TEST) test strip USE AS INSTRUCTED FOR TWICE DAILY TESTING  . Insulin Glargine (BASAGLAR KWIKPEN) 100 UNIT/ML SOPN INJECT 50 UNITS SUBCUTANEOUSLY AT BEDTIME (DISCONTINUE LEVEMIR)  . Insulin Pen Needle (BD PEN NEEDLE MICRO U/F) 32G X 6 MM MISC USE TO INJECT INSULIN DAILY  . irbesartan (AVAPRO) 300 MG tablet Take 1 tablet (300 mg total) by mouth daily.  . Lancets (ONETOUCH ULTRASOFT) lancets USE AS DIRECTED TWICE DAILY  . metoprolol tartrate (LOPRESSOR) 50 MG tablet TAKE ONE AND ONE-HALF (1 & 1/2) TABLETS BY MOUTH TWICE DAILY  . Multiple Vitamin (MULTIVITAMIN WITH MINERALS) TABS tablet Take 1 tablet by mouth daily.  . pantoprazole (PROTONIX) 40 MG tablet TAKE 1 TABLET BY MOUTH 30 MINUTES BEFORE FIRST MEAL  . polyethylene glycol (MIRALAX / GLYCOLAX) 17 g packet Take 17 g by mouth  daily as needed.   No facility-administered encounter medications on file as of 09/24/2019.     Activities of Daily Living In your present state of health, do you have any difficulty performing the following activities: 09/24/2019 09/21/2019  Hearing? N -  Vision? N -  Difficulty concentrating or making decisions? N -  Walking or climbing stairs? N -  Dressing or bathing? N -  Doing errands, shopping? N N  Preparing Food and eating ? N -  Using the Toilet? N -  In the  past six months, have you accidently leaked urine? N -  Do you have problems with loss of bowel control? N -  Managing your Medications? N -  Managing your Finances? N -  Housekeeping or managing your Housekeeping? N -  Some recent data might be hidden    Patient Care Team: [name], MD as PCP - General Laqueta Linden, MD as PCP - Cardiology (Cardiology) Laqueta Linden, MD as Attending Physician (Cardiology) Ferman Hamming, DPM as Consulting Physician (Podiatry) [name], MD as Consulting Physician (Gastroenterology)    Assessment:   This is a routine wellness examination for Adaline.  Exercise Activities and Dietary recommendations Current Exercise Habits: Home exercise routine, Type of exercise: Other - see comments(exercise bike), Time (Minutes): 60, Frequency (Times/Week): 3, Weekly Exercise (Minutes/Week): 180, Intensity: Moderate, Exercise limited by: None identified  Goals    . DIET - REDUCE SUGAR INTAKE    . Exercise 5x per week (60 min per time)     Starting 10/04/2016 patient would like to start back on her exercise bicycle 5 times a week for 60 minutes at a time.       Fall Risk Fall Risk  09/24/2019 07/27/2019 03/03/2019 09/22/2018 08/05/2018  Falls in the past year? 0 0 0 0 No  Number falls in past yr: 0 0 0 - -  Injury with Fall? 0 0 0 - -  Risk for fall due to : - - - - -   Is the patient's home free of loose throw rugs in walkways, pet beds, electrical cords,  etc?   yes      Grab bars in the bathroom? yes      Handrails on the stairs?   yes      Adequate lighting?   yes     Depression Screen PHQ 2/9 Scores 09/24/2019 07/27/2019 03/03/2019 09/22/2018  PHQ - 2 Score 0 0 0 0  PHQ- 9 Score - - - -     Cognitive Function     6CIT Screen 09/24/2019 09/22/2018 09/18/2017 10/04/2016  What Year? 0 points 0 points 0 points 0 points  What month? 0 points 0 points 0 points 0 points  What time? 0 points 0 points 0 points 0 points  Count back from 20 0 points 0 points 0 points 0 points  Months in reverse 0 points 0 points 0 points 0 points  Repeat phrase 0 points 2 points 0 points 0 points  Total Score 0 2 0 0    Immunization History  Administered Date(s) Administered  . Fluad Quad(high Dose 65+) 07/27/2019  . Influenza Split 08/01/2011, 07/31/2012  . Influenza Whole 07/29/2007, 07/20/2008, 07/21/2009, 07/19/2010  . Influenza, High Dose Seasonal PF 07/23/2018  . Influenza,inj,Quad PF,6+ Mos 08/07/2013, 06/15/2014, 07/19/2015, 06/28/2016, 07/25/2017  . Pneumococcal Conjugate-13 10/12/2014  . Pneumococcal Polysaccharide-23 03/16/2004, 08/29/2012  . Td 03/16/2004  . Tdap 03/31/2012  . Zoster 01/02/2007    Qualifies for Shingles Vaccine? completed  Screening Tests Health Maintenance  Topic Date Due  . OPHTHALMOLOGY EXAM  06/25/2018  . FOOT EXAM  08/19/2019  . HEMOGLOBIN A1C  01/22/2020  . TETANUS/TDAP  03/31/2022  . INFLUENZA VACCINE  Completed  . DEXA SCAN  Completed  . PNA vac Low Risk Adult  Completed    Cancer Screenings: Lung: Low Dose CT Chest recommended if Age 22-80 years, 30 pack-year currently smoking OR have quit w/in 15years. Patient does not qualify. Breast:  Up to date on Mammogram? Yes  Up to date of Bone Density/Dexa? Yes Colorectal: n/a   Additional Screenings:   Hepatitis C Screening:      Plan:       1. Encounter for Medicare annual wellness exam   I have personally reviewed and noted the following in the  patient's chart:   . Medical and social history . Use of alcohol, tobacco or illicit drugs  . Current medications and supplements . Functional ability and status . Nutritional status . Physical activity . Advanced directives . List of other physicians . Hospitalizations, surgeries, and ER visits in previous 12 months . Vitals . Screenings to include cognitive, depression, and falls . Referrals and appointments  In addition, I have reviewed and discussed with patient certain preventive protocols, quality metrics, and best practice recommendations. A written personalized care plan for preventive services as well as general preventive health recommendations were provided to patient.    I provided 20 minutes of non-face-to-face time during this encounter.   Freddy Finner, NP  09/24/2019

## 2019-09-25 ENCOUNTER — Ambulatory Visit: Payer: Medicare Other

## 2019-09-29 ENCOUNTER — Encounter: Payer: Medicare Other | Admitting: Family Medicine

## 2019-09-29 DIAGNOSIS — Z794 Long term (current) use of insulin: Secondary | ICD-10-CM | POA: Diagnosis not present

## 2019-09-29 DIAGNOSIS — E1122 Type 2 diabetes mellitus with diabetic chronic kidney disease: Secondary | ICD-10-CM | POA: Diagnosis not present

## 2019-09-29 DIAGNOSIS — N183 Chronic kidney disease, stage 3 unspecified: Secondary | ICD-10-CM | POA: Diagnosis not present

## 2019-09-29 LAB — BASIC METABOLIC PANEL WITH GFR
BUN/Creatinine Ratio: 17 (calc) (ref 6–22)
BUN: 18 mg/dL (ref 7–25)
CO2: 27 mmol/L (ref 20–32)
Calcium: 9.4 mg/dL (ref 8.6–10.4)
Chloride: 107 mmol/L (ref 98–110)
Creat: 1.08 mg/dL — ABNORMAL HIGH (ref 0.60–0.88)
GFR, Est African American: 56 mL/min/{1.73_m2} — ABNORMAL LOW (ref >=60)
GFR, Est Non African American: 48 mL/min/{1.73_m2} — ABNORMAL LOW (ref >=60)
Glucose, Bld: 148 mg/dL — ABNORMAL HIGH (ref 65–99)
Potassium: 4.2 mmol/L (ref 3.5–5.3)
Sodium: 142 mmol/L (ref 135–146)

## 2019-10-01 ENCOUNTER — Encounter: Payer: Self-pay | Admitting: Family Medicine

## 2019-10-01 ENCOUNTER — Other Ambulatory Visit: Payer: Self-pay

## 2019-10-01 ENCOUNTER — Ambulatory Visit: Payer: Medicare Other | Admitting: Family Medicine

## 2019-10-01 VITALS — BP 138/72 | HR 60 | Temp 97.9°F | Resp 15 | Ht 67.0 in | Wt 202.8 lb

## 2019-10-01 DIAGNOSIS — I480 Paroxysmal atrial fibrillation: Secondary | ICD-10-CM | POA: Diagnosis not present

## 2019-10-01 DIAGNOSIS — I200 Unstable angina: Secondary | ICD-10-CM

## 2019-10-01 DIAGNOSIS — E1122 Type 2 diabetes mellitus with diabetic chronic kidney disease: Secondary | ICD-10-CM

## 2019-10-01 DIAGNOSIS — Z794 Long term (current) use of insulin: Secondary | ICD-10-CM

## 2019-10-01 DIAGNOSIS — I10 Essential (primary) hypertension: Secondary | ICD-10-CM

## 2019-10-01 DIAGNOSIS — N183 Chronic kidney disease, stage 3 unspecified: Secondary | ICD-10-CM

## 2019-10-01 DIAGNOSIS — Z7689 Persons encountering health services in other specified circumstances: Secondary | ICD-10-CM | POA: Diagnosis not present

## 2019-10-01 NOTE — Patient Instructions (Signed)
  I appreciate the opportunity to provide you with care for your health and wellness. Today we discussed: recent hospital stay   Follow up: 12/15/2019  No labs or referrals today  Increase water and work on heart healthy diet.  Continue all medications as directed.   I hope you have a wonderful, happy, safe, and healthy Holiday Season! See you in the New Year :)  Please continue to practice social distancing to keep you, your family, and our community safe.  If you must go out, please wear a mask and practice good handwashing.  It was a pleasure to see you and I look forward to continuing to work together on your health and well-being. Please do not hesitate to call the office if you need care or have questions about your care.  Have a wonderful day and week. With Gratitude, Cherly Beach, DNP, AGNP-BC

## 2019-10-01 NOTE — Progress Notes (Signed)
Subjective:     Patient ID: Kimberly Kaufmannnne M Mcgurn, female   DOB: 1941-05-23, 78 y.o.   MRN: [id number]  Kimberly Freeman presents for Transitions Of Care  Mrs Sharol HarnessSimmons has a history significant forosteoarthritis, type 2 diabetes, hypertension, GERD, hyperlipidemia, remote history of atrial fibrillation about 20 years ago off of anticoagulation for a number of years now. Presented to the emergency room back on November 29. Was there for a 2-day stay. Discharged on December 1. Here today for transition of care visit.  At the emergency room she complained of precordial chest pain, pressure-like, nonradiating associated with dyspnea, palpitations and diaphoresis.  She denied having dizziness, nausea or emesis, stated she did have mild lower extremity edema.  She denied fevers, chills, sore throat, rhinorrhea.  No wheezing or productive cough was noted.  Denies having abdominal pain, diarrhea, melena or hematochezia.  She reported some occasional constipation.  Denied dysuria, frequency or urgency with hematuria.  Denied polyuria, polydipsia, polyphagia or blurred vision.  During the emergency room she was found to have elevated blood pressure at 181/88, sats were 96% on room air.  Respirations and pulse were within normal range.  EKG changes and cardiology work-up was ordered.  CBC showed a hemoglobin 11.9.  Otherwise it was normal.  Troponin was 9 then 23.  Her BMP showed a glucose of 342 and she was started on insulin infusion.  Her chest x-ray was in normal limits.  Hospital diagnoses include unstable angina EKG showed evidence of ST changes.  She was managed with a heparin drip had a transthoracic echocardiograms significant for EF of 70-75 with grade 1 diastolic dysfunction.  Cardiology consulted she underwent a left heart catheterization which was significant for mild to moderate disease as reported in the report.  Cardiology recommended medication changes to include baby aspirin, Lipitor 80 mg at  discharge.  She reports she has been taking these medications as directed.  All other medications were resumed and kept the same.  She is to have follow-up with cardiology as well as PCP.  Today in office she reports that she is doing great not having any discomfort or pain.  Reports that she is working to increase her water and maintain a heart healthy diet.  Is taking all medications like she supposed to.  Is following up with cardiology soon.  Appointment is December 31 though she would like to change that because her supposed to be going out of town to visit some family.  She reports that her home blood pressures have been in the range of 1 30-1 45 systolically with a diastolic of 70s.  She reports that she would like to lose some weight and is hoping that she will get there sooner than later with some diet modifications and exercise.  Today patient denies signs and symptoms of COVID 19 infection including fever, chills, cough, shortness of breath, and headache. Past Medical, Surgical, Social History, Allergies, and Medications have been Reviewed.   Past Medical History:  Diagnosis Date   Arthritis    Diabetes mellitus, type 2 (HCC)    Essential hypertension    GERD (gastroesophageal reflux disease)    Hyperlipidemia    Past Surgical History:  Procedure Laterality Date   ABDOMINAL HYSTERECTOMY     BIOPSY  07/18/2017   Procedure: BIOPSY;  Surgeon: West BaliFields, Sandi L, MD;  [facility];  Service: Endoscopy;;  gastric   CHOLECYSTECTOMY  2004   COLONOSCOPY N/A 07/18/2017   Dr. Darrick Pennafields: Internal  and external hemorrhoids.  No future screening/surveillance colonoscopies due to age.   ESOPHAGOGASTRODUODENOSCOPY N/A 07/18/2017   Dr. [name]: Small hiatal hernia, patchy mild inflammation characterized by congestion, erosions, erythema in the cardia, gastric body and antrum.  Diffuse moderate inflammation in the duodenal bulb and second portion duodenum.  Biopsies benign.  No H.  pylori.   LEFT HEART CATH AND CORONARY ANGIOGRAPHY N/A 09/21/2019   Procedure: LEFT HEART CATH AND CORONARY ANGIOGRAPHY;  Surgeon: [name], MD;  Location: [facility];  Service: Cardiovascular;  Laterality: N/A;   Social History   Socioeconomic History   Marital status: Married    Spouse name: [name]    Number of children: 0   Years of education: 12+   Highest education level: 12th grade  Occupational History   Occupation: retired   Tobacco Use   Smoking status: Never Smoker   Smokeless tobacco: Never Used  Substance and Sexual Activity   Alcohol use: No    Alcohol/week: 0.0 standard drinks   Drug use: No   Sexual activity: Not Currently    Partners: Male  Other Topics Concern   Not on file  Social History Narrative   Lives alone with husband    Social Determinants of Health   Financial Resource Strain: Low Risk    Difficulty of Paying Living Expenses: Not hard at all  Food Insecurity: No Food Insecurity   Worried About Programme researcher, broadcasting/film/video in the Last Year: Never true   Barista in the Last Year: Never true  Transportation Needs: No Transportation Needs   Lack of Transportation (Medical): No   Lack of Transportation (Non-Medical): No  Physical Activity: Sufficiently Active   Days of Exercise per Week: 4 days   Minutes of Exercise per Session: 40 min  Stress: No Stress Concern Present   Feeling of Stress : Not at all  Social Connections: Not Isolated   Frequency of Communication with Friends and Family: More than three times a week   Frequency of Social Gatherings with Friends and Family: Once a week   Attends Religious Services: 1 to 4 times per year   Active Member of [employer] or Organizations: Yes   Attends Engineer, structural: More than 4 times per year   Marital Status: Married  Catering manager Violence: Not At Risk   Fear of Current or Ex-Partner: No   Emotionally Abused: No   Physically Abused: No    Sexually Abused: No    Outpatient Encounter Medications as of 10/01/2019  Medication Sig   acetaminophen (TYLENOL) 500 MG tablet Take 500 mg by mouth daily as needed for mild pain or moderate pain. For pain    amLODipine (NORVASC) 2.5 MG tablet Take 1 tablet (2.5 mg total) by mouth daily.   aspirin 81 MG chewable tablet Chew 1 tablet (81 mg total) by mouth daily.   atorvastatin (LIPITOR) 80 MG tablet Take 1 tablet (80 mg total) by mouth daily at 6 PM.   calcium-vitamin D (OSCAL WITH D) 500-200 MG-UNIT tablet Take 1 tablet by mouth.   glipiZIDE (GLUCOTROL XL) 5 MG 24 hr tablet Take 1 tablet (5 mg total) by mouth daily.   glucose blood (ONE TOUCH ULTRA TEST) test strip USE AS INSTRUCTED FOR TWICE DAILY TESTING   Insulin Glargine (BASAGLAR KWIKPEN) 100 UNIT/ML SOPN INJECT 50 UNITS SUBCUTANEOUSLY AT BEDTIME (DISCONTINUE LEVEMIR)   Insulin Pen Needle (BD PEN NEEDLE MICRO U/F) 32G X 6 MM MISC USE TO INJECT  INSULIN DAILY   irbesartan (AVAPRO) 300 MG tablet Take 1 tablet (300 mg total) by mouth daily.   Lancets (ONETOUCH ULTRASOFT) lancets USE AS DIRECTED TWICE DAILY   metoprolol tartrate (LOPRESSOR) 50 MG tablet TAKE ONE AND ONE-HALF (1 & 1/2) TABLETS BY MOUTH TWICE DAILY   Multiple Vitamin (MULTIVITAMIN WITH MINERALS) TABS tablet Take 1 tablet by mouth daily.   pantoprazole (PROTONIX) 40 MG tablet TAKE 1 TABLET BY MOUTH 30 MINUTES BEFORE FIRST MEAL   polyethylene glycol (MIRALAX / GLYCOLAX) 17 g packet Take 17 g by mouth daily as needed.   No facility-administered encounter medications on file as of 10/01/2019.   Allergies  Allergen Reactions   Poison Oak Extract [Poison Oak Extract] Hives   Sulfonamide Derivatives Itching    Burning sensation all over body    Review of Systems  Constitutional: Negative.   HENT: Negative.   Eyes: Negative.   Respiratory: Negative.   Cardiovascular: Negative.   Gastrointestinal: Negative.   Endocrine: Negative.   Genitourinary:  Negative.   Musculoskeletal: Negative.   Skin: Negative.   Allergic/Immunologic: Negative.   Neurological: Negative.   Hematological: Negative.   Psychiatric/Behavioral: Negative.   All other systems reviewed and are negative.      Objective:     BP (!) 142/62    Pulse 60    Temp 97.9 F (36.6 C) (Oral)    Resp 15    Ht 5\' 7"  (1.702 m)    Wt 202 lb 12.8 oz (92 kg)    SpO2 96%    BMI 31.76 kg/m   Physical Exam Vitals and nursing note reviewed.  Constitutional:      Appearance: Normal appearance. She is well-developed and well-groomed. She is obese.  HENT:     Head: Normocephalic and atraumatic.     Right Ear: External ear normal.     Left Ear: External ear normal.     Nose: Nose normal.     Mouth/Throat:     Mouth: Mucous membranes are moist.     Pharynx: Oropharynx is clear.  Eyes:     General:        Right eye: No discharge.        Left eye: No discharge.     Conjunctiva/sclera: Conjunctivae normal.  Cardiovascular:     Rate and Rhythm: Normal rate and regular rhythm.     Pulses: Normal pulses.     Heart sounds: Normal heart sounds.  Pulmonary:     Effort: Pulmonary effort is normal.     Breath sounds: Normal breath sounds.  Musculoskeletal:        General: Normal range of motion.     Cervical back: Normal range of motion and neck supple.  Skin:    General: Skin is warm.  Neurological:     General: No focal deficit present.     Mental Status: She is alert and oriented to person, place, and time.  Psychiatric:        Attention and Perception: Attention normal.        Mood and Affect: Mood normal.        Speech: Speech normal.        Behavior: Behavior normal. Behavior is cooperative.        Thought Content: Thought content normal.        Cognition and Memory: Cognition normal.        Judgment: Judgment normal.        Assessment and Plan  1. Encounter for support and coordination of transition of care As documented above.  BMP recently checked  again on 12/8 demonstrated a slight decrease in GFR.  Encourage hydration for this.  Has follow-up with cardiology already.  Encouraged to maintain all medications as previously ordered including the aspirin and Lipitor changes.  She reports she is tolerating these okay.  2. Type 2 diabetes mellitus with stage 3 chronic kidney disease, with long-term current use of insulin, unspecified whether stage 3a or 3b CKD (HCC) Reports that her blood sugars have been stable this morning 119 was her fasting blood sugar.  Advised to continue all current medications at this time.  Make sure that she maintains adequate hydration and a heart healthy, low carb diabetic diet.  3. Unstable angina (HCC) Appears to be controlled/resolved at this time.  Continue all current medications as ordered.  Follow-up late December for cardiology.  4. Paroxysmal atrial fibrillation (HCC) Controlled continue metoprolol at this time.  Follow-up with cardiology in December.  5. Malignant hypertension Appears to be much more controlled at this time.  Possibly related to the changes that she was having while in the hospital.  She reports having good pressures at home.  On recheck she was stable today as well.  We will continue current medications at this time.  Appreciate collaboration with cardiology if they feel like anything needs to be adjusted or changed we will let them take the lead.  Follow-up: February  Freddy Finner, DNP, AGNP-BC [facility] [address], [address] [place], [place] [address] Office Hours: Mon-Thurs 8 am-5 pm; Fri 8 am-12 pm Office Phone:  [phone]  Office Fax: [phone]

## 2019-10-02 ENCOUNTER — Other Ambulatory Visit: Payer: Self-pay | Admitting: Family Medicine

## 2019-10-02 NOTE — Telephone Encounter (Signed)
Routing to [employer] for approval prior to filling

## 2019-10-03 ENCOUNTER — Encounter: Payer: Self-pay | Admitting: Family Medicine

## 2019-10-06 ENCOUNTER — Encounter: Payer: Self-pay | Admitting: Gastroenterology

## 2019-10-12 ENCOUNTER — Ambulatory Visit (INDEPENDENT_AMBULATORY_CARE_PROVIDER_SITE_OTHER): Payer: Medicare Other | Admitting: Physician Assistant

## 2019-10-12 ENCOUNTER — Encounter: Payer: Self-pay | Admitting: Physician Assistant

## 2019-10-12 ENCOUNTER — Other Ambulatory Visit: Payer: Self-pay

## 2019-10-12 VITALS — BP 155/65 | HR 71 | Temp 97.5°F | Ht 67.0 in | Wt 208.0 lb

## 2019-10-12 DIAGNOSIS — I200 Unstable angina: Secondary | ICD-10-CM | POA: Diagnosis not present

## 2019-10-12 DIAGNOSIS — I2510 Atherosclerotic heart disease of native coronary artery without angina pectoris: Secondary | ICD-10-CM

## 2019-10-12 DIAGNOSIS — I10 Essential (primary) hypertension: Secondary | ICD-10-CM

## 2019-10-12 MED ORDER — METOPROLOL TARTRATE 100 MG PO TABS: 100.0000 mg | ORAL_TABLET | Freq: Two times a day (BID) | ORAL | 3 refills | Status: DC

## 2019-10-12 NOTE — Patient Instructions (Signed)
Medication Instructions:  Your physician has recommended you make the following change in your medication:   Increase Lopressor to 100 mg Two Times Daily   *If you need a refill on your cardiac medications before your next appointment, please call your pharmacy*  Lab Work: NONE  If you have labs (blood work) drawn today and your tests are completely normal, you will receive your results only by: Marland Kitchen MyChart Message (if you have MyChart) OR . A paper copy in the mail If you have any lab test that is abnormal or we need to change your treatment, we will call you to review the results.  Testing/Procedures: None   Follow-Up: At [facility], you and your health needs are our priority.  As part of our continuing mission to provide you with exceptional heart care, we have created designated Provider Care Teams.  These Care Teams include your primary Cardiologist (physician) and Advanced Practice Providers (APPs -  Physician Assistants and Nurse Practitioners) who all work together to provide you with the care you need, when you need it.  Your next appointment:   6 month(s)  The format for your next appointment:   In Person  Provider:   Kate Sable, MD  Other Instructions Thank you for choosing [facility]!

## 2019-10-12 NOTE — Progress Notes (Signed)
Cardiology Office Note   Date:  10/12/2019   ID:  Arnell, Mausolf 12/18/1940, MRN [id number]  PCP:  Fayrene Helper, MD Cardiologist:  Kate Sable, MD 12/15/2015 Electrphysiologist: None Rosaria Ferries, PA-C   No chief complaint on file.   History of Present Illness: Kimberly Freeman is a 78 y.o. female with a history of type 2 diabetes mellitus, hypertension, hyperlipidemia, GERD, and possibly remote atrial fibrillation  Admitted 11/29-12/10/2018 for CP, med rx for mod CAD at cath  Kimberly Freeman presents for cardiology follow up.   She has not had any chest pain since discharge.   No LE edema, no orthopnea or PND.  No palpitations, no presyncope or syncope.  She is feeling well.   Her BP does not normally run this high. She is compliant w/ her medications.    Past Medical History:  Diagnosis Date  . Arthritis   . CAD (coronary artery disease) 08/2019   60% RI with other 20-30% lesions at cath  . Diabetes mellitus, type 2 ([place])   . Essential hypertension   . GERD (gastroesophageal reflux disease)   . Hyperlipidemia     Past Surgical History:  Procedure Laterality Date  . ABDOMINAL HYSTERECTOMY    . BIOPSY  07/18/2017   Procedure: BIOPSY;  Surgeon: Danie Binder, MD;  [facility];  Service: Endoscopy;;  gastric  . CHOLECYSTECTOMY  2004  . COLONOSCOPY N/A 07/18/2017   Dr. Oneida Alar: Internal and external hemorrhoids.  No future screening/surveillance colonoscopies due to age.  . ESOPHAGOGASTRODUODENOSCOPY N/A 07/18/2017   Dr. Oneida Alar: Small hiatal hernia, patchy mild inflammation characterized by congestion, erosions, erythema in the cardia, gastric body and antrum.  Diffuse moderate inflammation in the duodenal bulb and second portion duodenum.  Biopsies benign.  No H. pylori.  Marland Kitchen LEFT HEART CATH AND CORONARY ANGIOGRAPHY N/A 09/21/2019   Procedure: LEFT HEART CATH AND CORONARY ANGIOGRAPHY;  Surgeon: Troy Sine, MD;  Location: [place] [facility];  Service: Cardiovascular;  Laterality: N/A;    Current Outpatient Medications  Medication Sig Dispense Refill  . acetaminophen (TYLENOL) 500 MG tablet Take 500 mg by mouth daily as needed for mild pain or moderate pain. For pain     . amLODipine (NORVASC) 2.5 MG tablet Take 1 tablet (2.5 mg total) by mouth daily. 90 tablet 1  . aspirin 81 MG chewable tablet Chew 1 tablet (81 mg total) by mouth daily. 30 tablet 0  . atorvastatin (LIPITOR) 80 MG tablet Take 1 tablet (80 mg total) by mouth daily at 6 PM. 30 tablet 0  . calcium-vitamin D (OSCAL WITH D) 500-200 MG-UNIT tablet Take 1 tablet by mouth.    Marland Kitchen glipiZIDE (GLUCOTROL XL) 5 MG 24 hr tablet Take 1 tablet (5 mg total) by mouth daily. 90 tablet 3  . glucose blood (ONE TOUCH ULTRA TEST) test strip USE AS INSTRUCTED FOR TWICE DAILY TESTING 200 each 3  . Insulin Glargine (BASAGLAR KWIKPEN) 100 UNIT/ML SOPN INJECT 50 UNITS SUBCUTANEOUSLY AT BEDTIME (DISCONTINUE LEVEMIR) 45 mL 3  . Insulin Pen Needle (BD PEN NEEDLE MICRO U/F) 32G X 6 MM MISC USE TO INJECT INSULIN DAILY 100 each 3  . irbesartan (AVAPRO) 300 MG tablet Take 1 tablet (300 mg total) by mouth daily. 90 tablet 3  . Lancets (ONETOUCH ULTRASOFT) lancets USE AS DIRECTED TWICE DAILY 200 each 3  . Multiple Vitamin (MULTIVITAMIN WITH MINERALS) TABS tablet Take 1 tablet by mouth daily.    Marland Kitchen  pantoprazole (PROTONIX) 40 MG tablet TAKE 1 TABLET BY MOUTH 30 MINUTES BEFORE FIRST MEAL 90 tablet 3  . polyethylene glycol (MIRALAX / GLYCOLAX) 17 g packet Take 17 g by mouth daily as needed.    . metoprolol tartrate (LOPRESSOR) 100 MG tablet Take 1 tablet (100 mg total) by mouth 2 (two) times daily. 180 tablet 3   No current facility-administered medications for this visit.    Allergies:   Poison oak extract [poison oak extract] and Sulfonamide derivatives    Social History:  The patient  reports that she has never smoked. She has never used smokeless tobacco. She reports that she does  not drink alcohol or use drugs.   Family History:  The patient's family history includes Cancer in her mother; Diabetes in her sister; Leukemia in her mother; Lung cancer (age of onset: 452) in her sister; Prostate cancer in her father.  She indicated that her mother is deceased. She indicated that her father is deceased. She indicated that only one of her two sisters is alive. She indicated that the status of her neg hx is unknown.    ROS:  Please see the history of present illness. All other systems are reviewed and negative.    PHYSICAL EXAM: VS:  BP (!) 155/65   Pulse 71   Temp (!) 97.5 F (36.4 C)   Ht 5\' 7"  (1.702 m)   Wt 94.3 kg   SpO2 94%   BMI 32.58 kg/m  , BMI Body mass index is 32.58 kg/m. GEN: Well nourished, well developed, female in no acute distress HEENT: normal for age  Neck: no JVD, no carotid bruit, no masses Cardiac: RRR; soft murmur, no rubs, or gallops Respiratory:  clear to auscultation bilaterally, normal work of breathing GI: soft, nontender, nondistended, + BS MS: no deformity or atrophy; no edema; distal pulses are 2+ in all 4 extremities, cath site w/ some bruising but good pulse. Skin: warm and dry, no rash Neuro:  Strength and sensation are intact Psych: euthymic mood, full affect   EKG:  EKG is not ordered today.  Cath: 09/21/19   Lat Ramus lesion is 60% stenosed.  Mid LAD lesion is 30% stenosed.  Prox RCA lesion is 20% stenosed.  Mid RCA lesion is 20% stenosed.  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  LV EDP 21 mm. The echo Doppler study done earlier today showed vigorous LV contractility with an EF at 70 to 75% with severe LVH.  RECOMMENDATION: Initial medical therapy trial. Aggressive lipid-lowering therapy with target LDL less than 70. Optimal blood pressure  control with ideal blood pressure less than 120/80 with stage I hypertension commencing at 130/80. Weight loss and exercise will be recommended.  Diagnostic Dominance: Right  TTE: 09/21/19  IMPRESSIONS 1. Left ventricular ejection fraction, by visual estimation, is 70 to 75%. The left ventricle has hyperdynamic function. There is severely increased left ventricular hypertrophy. 2. Left ventricular diastolic parameters are consistent with Grade I diastolic dysfunction (impaired relaxation). 3. Global right ventricle has normal systolic function.The right ventricular size is normal. Mildly increased right ventricular wall thickness. 4. Left atrial size was mild-moderately dilated. 5. Right atrial size was normal. 6. Mild aortic valve annular calcification. 7. Mild mitral annular calcification. 8. The mitral valve is grossly normal. Trace mitral valve regurgitation. 9. The  tricuspid valve is grossly normal. Tricuspid valve regurgitation is mild. 10. The aortic valve is tricuspid. Aortic valve regurgitation is trivial. Moderately sclerotic to mildly stenotic aortic valve. 11. The pulmonic valve was not well visualized. Pulmonic valve regurgitation is trivial. 12. TR signal is inadequate for assessing pulmonary artery systolic pressure. 13. The inferior vena cava is dilated in size with >50% respiratory variability, suggesting right atrial pressure of 8 mmHg.  Recent Labs: 07/24/2019: TSH 6.85 07/28/2019: ALT 26 09/21/2019: Magnesium 1.7 09/22/2019: Hemoglobin 11.0; Platelets 185 09/29/2019: BUN 18; Creat 1.08; Potassium 4.2; Sodium 142  CBC    Component Value Date/Time   WBC 6.3 09/22/2019 0511   RBC 3.65 (L) 09/22/2019 0511   HGB 11.0 (L) 09/22/2019 0511   HCT 33.9 (L) 09/22/2019 0511   PLT 185 09/22/2019 0511   MCV 92.9 09/22/2019 0511   MCH 30.1 09/22/2019 0511   MCHC 32.4 09/22/2019 0511   RDW 12.5 09/22/2019 0511   LYMPHSABS 2.4 07/28/2019 1257   MONOABS 0.6  07/28/2019 1257   EOSABS 0.4 07/28/2019 1257   BASOSABS 0.0 07/28/2019 1257   CMP Latest Ref Rng & Units 09/29/2019 09/22/2019 09/21/2019  Glucose 65 - 99 mg/dL 702(O) 378(H) 885(O)  BUN 7 - 25 mg/dL 18 11 14   Creatinine 0.60 - 0.88 mg/dL ) 2.77(A 1.28)  Sodium 135 - 146 mmol/L 142 141 138  Potassium 3.5 - 5.3 mmol/L 4.2 3.9 3.9  Chloride 98 - 110 mmol/L 107 106 105  CO2 20 - 32 mmol/L 27 23 26   Calcium 8.6 - 10.4 mg/dL 9.4 9.1 9.1  Total Protein 6.5 - 8.1 g/dL - - -  Total Bilirubin 0.3 - 1.2 mg/dL - - -  Alkaline Phos 38 - 126 U/L - - -  AST 15 - 41 U/L - - -  ALT 0 - 44 U/L - - -     Lipid Panel Lab Results  Component Value Date   CHOL 162 03/02/2019   HDL 50 03/02/2019   LDLCALC 93 03/02/2019   TRIG 92 03/02/2019   CHOLHDL 3.2 03/02/2019      Wt Readings from Last 3 Encounters:  10/12/19 94.3 kg  10/01/19 92 kg  09/24/19 89.8 kg     Other studies Reviewed: Additional studies/ records that were reviewed today include: Office notes, hospital records and testing.  ASSESSMENT AND PLAN:  1.  CAD:  - non-obstructive by cath 09/21/2019 - med rx w/ ASA 81 mg, Lipitor 80 mg, Lopressor 100 mg bid - since no sx, will not add nitrates.   2. HTN - BP is up today, pt says not usually this high - requested she track BP, let 14/03/20 or her PCP know if running high - emphasized she needs good control to keep CAD from getting worse. - will go ahead and increase Lopressor to 100 mg bid, other changes per PCP   Current medicines are reviewed at length with the patient today.  The patient does not have concerns regarding medicines.  The following changes have been made:  no change  Labs/ tests ordered today include:  No orders of the defined types were placed in this encounter.    Disposition:   FU with 09/23/2019, MD  Signed, [place], PA-C  10/12/2019 8:33 PM    [facility] Phone: [phone]; Fax: [phone]

## 2019-10-22 ENCOUNTER — Ambulatory Visit: Payer: Medicare Other | Admitting: Student

## 2019-11-10 ENCOUNTER — Other Ambulatory Visit: Payer: Self-pay

## 2019-11-10 MED ORDER — BASAGLAR KWIKPEN 100 UNIT/ML ~~LOC~~ SOPN: PEN_INJECTOR | SUBCUTANEOUS | 5 refills | Status: DC

## 2019-12-01 ENCOUNTER — Ambulatory Visit (INDEPENDENT_AMBULATORY_CARE_PROVIDER_SITE_OTHER): Payer: Medicare Other | Admitting: Family Medicine

## 2019-12-01 ENCOUNTER — Encounter: Payer: Self-pay | Admitting: Family Medicine

## 2019-12-01 ENCOUNTER — Other Ambulatory Visit: Payer: Self-pay

## 2019-12-01 VITALS — BP 168/68 | HR 70 | Temp 97.3°F | Resp 15 | Ht 67.0 in | Wt 204.0 lb

## 2019-12-01 DIAGNOSIS — M7989 Other specified soft tissue disorders: Secondary | ICD-10-CM | POA: Diagnosis not present

## 2019-12-01 DIAGNOSIS — I10 Essential (primary) hypertension: Secondary | ICD-10-CM | POA: Diagnosis not present

## 2019-12-01 DIAGNOSIS — E669 Obesity, unspecified: Secondary | ICD-10-CM

## 2019-12-01 DIAGNOSIS — E785 Hyperlipidemia, unspecified: Secondary | ICD-10-CM

## 2019-12-01 MED ORDER — ATORVASTATIN CALCIUM 80 MG PO TABS: 80.0000 mg | ORAL_TABLET | Freq: Every day | ORAL | 1 refills | Status: DC

## 2019-12-01 NOTE — Progress Notes (Signed)
Subjective:  Patient ID: Kimberly Freeman, female    DOB: [birth year]  Age: 79 y.o. MRN: [id number]  CC:  Chief Complaint  Patient presents with  . Edema    ankles      HPI  HPI Ms. Melikian reports today secondary to having edema and leg swelling bilaterally.  Did not have this back in December when she saw me and she went to the cardiologist.  She reports that it is rather new.  She reports that she does not elevate her legs when she is sitting.  She reports that she also does not use compression socks.  She denies having any chest pain, shortness of breath, cough, headache, vision changes.  She reports that her blood pressure is elevated today secondary to being stressed about some plumbing issues at home.  She reports he is taking all her medications without any issues.  She had an increase in her Lopressor on her last appointment with the cardiologist she takes 100 mg twice daily she reports that she is tolerating this well without any issue.  She reports that her blood pressures at home range from 433-295 systolically.  Over the 18A to 41Y diastolically.  She knows that she can eat a little bit better diet.  But she does try to refrain from eating a lot of salt and heavy fats.  She knows that she needs to lose a little bit of weight she has lost 4 pounds in the last few weeks.  Today patient denies signs and symptoms of COVID 19 infection including fever, chills, cough, shortness of breath, and headache. Past Medical, Surgical, Social History, Allergies, and Medications have been Reviewed.   Past Medical History:  Diagnosis Date  . Arthritis   . CAD (coronary artery disease) 08/2019   60% RI with other 20-30% lesions at cath  . Diabetes mellitus, type 2 ([place])   . Essential hypertension   . GERD (gastroesophageal reflux disease)   . Hyperlipidemia     Current Meds  Medication Sig  . acetaminophen (TYLENOL) 500 MG tablet Take 500 mg by mouth daily as needed for mild pain or  moderate pain. For pain   . amLODipine (NORVASC) 2.5 MG tablet Take 1 tablet (2.5 mg total) by mouth daily.  Marland Kitchen aspirin 81 MG chewable tablet Chew 1 tablet (81 mg total) by mouth daily.  Marland Kitchen atorvastatin (LIPITOR) 80 MG tablet Take 1 tablet (80 mg total) by mouth daily at 6 PM.  . calcium-vitamin D (OSCAL WITH D) 500-200 MG-UNIT tablet Take 1 tablet by mouth.  Marland Kitchen glipiZIDE (GLUCOTROL XL) 5 MG 24 hr tablet Take 1 tablet (5 mg total) by mouth daily.  Marland Kitchen glucose blood (ONE TOUCH ULTRA TEST) test strip USE AS INSTRUCTED FOR TWICE DAILY TESTING  . Insulin Glargine (BASAGLAR KWIKPEN) 100 UNIT/ML SOPN INJECT 50 UNITS SUBCUTANEOUSLY AT BEDTIME (DISCONTINUE LEVEMIR)  . Insulin Pen Needle (BD PEN NEEDLE MICRO U/F) 32G X 6 MM MISC USE TO INJECT INSULIN DAILY  . irbesartan (AVAPRO) 300 MG tablet Take 1 tablet (300 mg total) by mouth daily.  . Lancets (ONETOUCH ULTRASOFT) lancets USE AS DIRECTED TWICE DAILY  . metoprolol tartrate (LOPRESSOR) 100 MG tablet Take 1 tablet (100 mg total) by mouth 2 (two) times daily.  . Multiple Vitamin (MULTIVITAMIN WITH MINERALS) TABS tablet Take 1 tablet by mouth daily.  . pantoprazole (PROTONIX) 40 MG tablet TAKE 1 TABLET BY MOUTH 30 MINUTES BEFORE FIRST MEAL  . polyethylene glycol (MIRALAX / GLYCOLAX)  17 g packet Take 17 g by mouth daily as needed.  . [DISCONTINUED] atorvastatin (LIPITOR) 80 MG tablet Take 1 tablet (80 mg total) by mouth daily at 6 PM.    ROS:  ROS   Objective:   Today's Vitals: BP (!) 168/68   Pulse 70   Temp (!) 97.3 F (36.3 C) (Temporal)   Resp 15   Ht 5\' 7"  (1.702 m)   Wt 204 lb (92.5 kg)   SpO2 97%   BMI 31.95 kg/m  Vitals with BMI 12/01/2019 10/12/2019 10/01/2019  Height 5\' 7"  5\' 7"  5\' 7"   Weight 204 lbs 208 lbs 202 lbs 13 oz  BMI 31.94 32.57 31.76  Systolic 168 155 14/07/2019  Diastolic 68 65 72  Pulse 70 71 60     Physical Exam Vitals and nursing note reviewed.  Constitutional:      Appearance: Normal appearance. She is well-developed  and well-groomed. She is obese.  HENT:     Head: Normocephalic and atraumatic.     Comments: Mask in place    Right Ear: External ear normal.     Left Ear: External ear normal.  Eyes:     General:        Right eye: No discharge.        Left eye: No discharge.     Conjunctiva/sclera: Conjunctivae normal.  Cardiovascular:     Rate and Rhythm: Normal rate and regular rhythm.     Pulses: Normal pulses.     Heart sounds: Normal heart sounds.  Pulmonary:     Effort: Pulmonary effort is normal.     Breath sounds: Normal breath sounds.  Musculoskeletal:        General: Normal range of motion.     Cervical back: Normal range of motion and neck supple.     Right lower leg: 2+ Edema present.     Left lower leg: 2+ Edema present.  Skin:    General: Skin is warm.  Neurological:     General: No focal deficit present.     Mental Status: She is alert and oriented to person, place, and time.  Psychiatric:        Attention and Perception: Attention normal.        Mood and Affect: Mood normal.        Speech: Speech normal.        Behavior: Behavior normal. Behavior is cooperative.        Thought Content: Thought content normal.        Cognition and Memory: Cognition normal.        Judgment: Judgment normal.     Assessment   1. Essential hypertension   2. Obesity (BMI 30-39.9)   3. Leg swelling   4. Hyperlipidemia LDL goal <100     Tests ordered No orders of the defined types were placed in this encounter.    Plan: Please see assessment and plan per problem list above.   Meds ordered this encounter  Medications  . atorvastatin (LIPITOR) 80 MG tablet    Sig: Take 1 tablet (80 mg total) by mouth daily at 6 PM.    Dispense:  90 tablet    Refill:  1    Patient to follow-up in 12/15/2019 .  , NP

## 2019-12-01 NOTE — Assessment & Plan Note (Signed)
Improved, but would benefit from loosing a little more if possible.  Kimberly Freeman is re-educated about the importance of exercise daily to help with weight management. A minumum of 30 minutes daily is recommended. Additionally, importance of healthy food choices  with portion control discussed.  Wt Readings from Last 3 Encounters:  12/01/19 204 lb (92.5 kg)  10/12/19 208 lb (94.3 kg)  10/01/19 202 lb 12.8 oz (92 kg)

## 2019-12-01 NOTE — Patient Instructions (Addendum)
Happy New Year! May you have a year filled with hope, love, happiness and laughter.  I appreciate the opportunity to provide you with care for your health and wellness. Today we discussed: leg swelling  Follow up: 4 weeks for BP and leg swelling check     No labs or referrals today  Get compressions socks Elevate legs when sitting. Avoid sitting for longer than 20-30 minutes without getting up to move around some. Elevate legs in bed.  Please consider the COVID vaccine, our office can help get you scheduled.  Please continue to practice social distancing to keep you, your family, and our community safe.  If you must go out, please wear a mask and practice good handwashing.  It was a pleasure to see you and I look forward to continuing to work together on your health and well-being. Please do not hesitate to call the office if you need care or have questions about your care.  Have a wonderful day and week. With Gratitude, Tereasa Coop, DNP, AGNP-BC

## 2019-12-01 NOTE — Assessment & Plan Note (Signed)
Refilled needed, provided, encouraged low fat heart healthy diet

## 2019-12-01 NOTE — Assessment & Plan Note (Signed)
Kimberly Freeman is encouraged to maintain a well balanced diet that is low in salt. Controlled, continue current medication regimen. No refills needed.  Additionally, she is also reminded that exercise is beneficial for heart health and control of  Blood pressure. 30-60 minutes daily is recommended-walking was suggested.  Might need an adjustment if SBP stays elevated. F/u with Dr Lodema Hong in 2 weeks

## 2019-12-01 NOTE — Assessment & Plan Note (Signed)
Bilateral leg swelling/ankle She reports this is new. Heart Cath was good. Last Cards appt was good and neg for leg swelling Suggested compression hose as the swelling goes down when she sleeps. Advised to keep legs elevated when sitting and to not sit for longer than 20-30 minutes at a time before getting up to walk around.  Patient acknowledged agreement and understanding of the plan.  F/u with Dr Lodema Hong in 2 weeks

## 2019-12-15 ENCOUNTER — Ambulatory Visit (INDEPENDENT_AMBULATORY_CARE_PROVIDER_SITE_OTHER): Payer: Medicare Other | Admitting: Family Medicine

## 2019-12-15 ENCOUNTER — Other Ambulatory Visit: Payer: Self-pay

## 2019-12-15 ENCOUNTER — Encounter: Payer: Self-pay | Admitting: Family Medicine

## 2019-12-15 VITALS — BP 122/68 | HR 73 | Temp 97.8°F | Ht 67.0 in | Wt 202.4 lb

## 2019-12-15 DIAGNOSIS — Z794 Long term (current) use of insulin: Secondary | ICD-10-CM | POA: Diagnosis not present

## 2019-12-15 DIAGNOSIS — E1121 Type 2 diabetes mellitus with diabetic nephropathy: Secondary | ICD-10-CM

## 2019-12-15 DIAGNOSIS — I10 Essential (primary) hypertension: Secondary | ICD-10-CM

## 2019-12-15 DIAGNOSIS — E785 Hyperlipidemia, unspecified: Secondary | ICD-10-CM | POA: Diagnosis not present

## 2019-12-15 DIAGNOSIS — E1122 Type 2 diabetes mellitus with diabetic chronic kidney disease: Secondary | ICD-10-CM

## 2019-12-15 DIAGNOSIS — E1142 Type 2 diabetes mellitus with diabetic polyneuropathy: Secondary | ICD-10-CM | POA: Diagnosis not present

## 2019-12-15 DIAGNOSIS — N1832 Chronic kidney disease, stage 3b: Secondary | ICD-10-CM

## 2019-12-15 DIAGNOSIS — B351 Tinea unguium: Secondary | ICD-10-CM | POA: Diagnosis not present

## 2019-12-15 DIAGNOSIS — R7989 Other specified abnormal findings of blood chemistry: Secondary | ICD-10-CM | POA: Diagnosis not present

## 2019-12-15 NOTE — Patient Instructions (Signed)
f/u in 4 months, call if you need me before  Labs today, HBA1C, cmp and and EGFr and lipids  No med change  Good foot exam  It is important that you exercise regularly at least 30 minutes 5 times a week. If you develop chest pain, have severe difficulty breathing, or feel very tired, stop exercising immediately and seek medical attention   Think about what you will eat, plan ahead. Choose " clean, green, fresh or frozen" over canned, processed or packaged foods which are more sugary, salty and fatty. 70 to 75% of food eaten should be vegetables and fruit. Three meals at set times with snacks allowed between meals, but they must be fruit or vegetables. Aim to eat over a 12 hour period , example 7 am to 7 pm, and STOP after  your last meal of the day. Drink water,generally about 64 ounces per day, no other drink is as healthy. Fruit juice is best enjoyed in a healthy way, by EATING the fruit. Thanks for choosing [facility], we consider it a privelige to serve you.

## 2019-12-17 NOTE — Progress Notes (Addendum)
LVM for pt to call the o  Kimberly Freeman     MRN: [id number]      DOB: February 10, 1941   HPI Kimberly Freeman is here for follow up and re-evaluation of chronic medical conditions, medication management and review of any available recent lab and radiology data.  Preventive health is updated, specifically  Cancer screening and Immunization.   Questions or concerns regarding consultations or procedures which the PT has had in the interim are  addressed. The PT denies any adverse reactions to current medications since the last visit.  C/o leg and ankle swelling and wants this reviewed  ROS Denies recent fever or chills. Denies sinus pressure, nasal congestion, ear pain or sore throat. Denies chest congestion, productive cough or wheezing. Denies chest pains, palpitations , PND or orthopnea Denies abdominal pain, nausea, vomiting,diarrhea or constipation.   Denies dysuria, frequency, hesitancy or incontinence. Denies uncontrolled  joint pain, swelling and limitation in mobility. Denies headaches, seizures, numbness, or tingling. Denies depression, anxiety or insomnia. Denies skin break down or rash.   PE  BP 122/68 (BP Location: Right Arm, Patient Position: Sitting)   Pulse 73   Temp 97.8 F (36.6 C) (Temporal)   Ht 5\' 7"  (1.702 m)   Wt 202 lb 6.4 oz (91.8 kg)   SpO2 96%   BMI 31.70 kg/m   Patient alert and oriented and in no cardiopulmonary distress.  HEENT: No facial asymmetry, EOMI,     Neck supple .  Chest: Clear to auscultation bilaterally.  CVS: S1, S2 no murmurs, no S3.Regular rate.  ABD: Soft non tender.   Ext: No edema  MS: Adequate though reduced  ROM spine, shoulders, hips and knees.Slight deformity of ankles, no pitting edema  Skin: Intact, no ulcerations or rash noted.  Psych: Good eye contact, normal affect. Memory intact not anxious or depressed appearing.  CNS: CN 2-12 intact, power,  normal throughout.no focal deficits noted.   Assessment & Plan  Malignant  hypertension Controlled, no change in medication   Type 2 diabetes mellitus with diabetic chronic kidney disease (HCC) Kimberly Freeman is reminded of the importance of commitment to daily physical activity for 30 minutes or more, as able and the need to limit carbohydrate intake to 30 to 60 grams per meal to help with blood sugar control.   The need to take medication as prescribed, test blood sugar as directed, and to call between visits if there is a concern that blood sugar is uncontrolled is also discussed.   Kimberly Freeman is reminded of the importance of daily foot exam, annual eye examination, and good blood sugar, blood pressure and cholesterol control. Deteriorated and uncontrolled , med adjustment  needed  Diabetic Labs Latest Ref Rng & Units 12/15/2019 09/29/2019 09/22/2019 09/21/2019 09/20/2019  HbA1c <5.7 % of total Hgb 9.6(H) - - - -  Microalbumin mg/dL - - - - -  Micro/Creat Ratio <30 mcg/mg creat - - - - -  Chol <200 mg/dL 09/22/2019 - - - -  HDL > OR = 50 mg/dL 409) - - - -  Calc LDL mg/dL (calc) 80 - - - -  Triglycerides <150 mg/dL 81(X - - - -  Creatinine 0.60 - 0.88 mg/dL 914) 7.82(N) 5.62(Z 3.08) 1.42(H)   BP/Weight 12/15/2019 12/01/2019 10/12/2019 10/01/2019 09/24/2019 09/22/2019 08/20/2019  Systolic BP 122 168 155 138 127 157 127  Diastolic BP 68 68 65 72 63 71 63  Wt. (Lbs) 202.4 204 208 202.8 198 199.6 198.8  BMI 31.7  31.95 32.58 31.76 31.01 31.26 31.14   Foot/eye exam completion dates Latest Ref Rng & Units 12/15/2019 08/05/2018  Eye Exam No Retinopathy - -  Foot exam Order - - -  Foot Form Completion - Done Done        Hyperlipidemia LDL goal <100 Hyperlipidemia:Low fat diet discussed and encouraged.   Lipid Panel  Lab Results  Component Value Date   CHOL 147 12/15/2019   HDL 45 (L) 12/15/2019   LDLCALC 80 12/15/2019   TRIG 127 12/15/2019   CHOLHDL 3.3 12/15/2019   Needs to increase exercise,     Abnormal TSH rept TSh free T3 and free T4 needs to be  added on, may need Endo eval

## 2019-12-18 ENCOUNTER — Other Ambulatory Visit: Payer: Self-pay

## 2019-12-18 MED ORDER — GLIPIZIDE ER 10 MG PO TB24: 10.0000 mg | ORAL_TABLET | Freq: Every day | ORAL | 1 refills | Status: DC

## 2019-12-20 ENCOUNTER — Encounter: Payer: Self-pay | Admitting: Family Medicine

## 2019-12-20 NOTE — Assessment & Plan Note (Signed)
Controlled, no change in medication  

## 2019-12-20 NOTE — Assessment & Plan Note (Signed)
Hyperlipidemia:Low fat diet discussed and encouraged.   Lipid Panel  Lab Results  Component Value Date   CHOL 147 12/15/2019   HDL 45 (L) 12/15/2019   LDLCALC 80 12/15/2019   TRIG 127 12/15/2019   CHOLHDL 3.3 12/15/2019   Needs to increase exercise,

## 2019-12-20 NOTE — Assessment & Plan Note (Signed)
Kimberly Freeman is reminded of the importance of commitment to daily physical activity for 30 minutes or more, as able and the need to limit carbohydrate intake to 30 to 60 grams per meal to help with blood sugar control.   The need to take medication as prescribed, test blood sugar as directed, and to call between visits if there is a concern that blood sugar is uncontrolled is also discussed.   Kimberly Freeman is reminded of the importance of daily foot exam, annual eye examination, and good blood sugar, blood pressure and cholesterol control. Deteriorated and uncontrolled , med adjustment  needed  Diabetic Labs Latest Ref Rng & Units 12/15/2019 09/29/2019 09/22/2019 09/21/2019 09/20/2019  HbA1c <5.7 % of total Hgb 9.6(H) - - - -  Microalbumin mg/dL - - - - -  Micro/Creat Ratio <30 mcg/mg creat - - - - -  Chol <200 mg/dL 220 - - - -  HDL > OR = 50 mg/dL 25(K) - - - -  Calc LDL mg/dL (calc) 80 - - - -  Triglycerides <150 mg/dL 270 - - - -  Creatinine 0.60 - 0.88 mg/dL 6.23(J) 6.28(B) 1.51 7.61(Y) 1.42(H)   BP/Weight 12/15/2019 12/01/2019 10/12/2019 10/01/2019 09/24/2019 09/22/2019 08/20/2019  Systolic BP 122 168 155 138 127 157 127  Diastolic BP 68 68 65 72 63 71 63  Wt. (Lbs) 202.4 204 208 202.8 198 199.6 198.8  BMI 31.7 31.95 32.58 31.76 31.01 31.26 31.14   Foot/eye exam completion dates Latest Ref Rng & Units 12/15/2019 08/05/2018  Eye Exam No Retinopathy - -  Foot exam Order - - -  Foot Form Completion - Done Done

## 2019-12-20 NOTE — Assessment & Plan Note (Signed)
rept TSh free T3 and free T4 needs to be added on, may need Endo eval

## 2019-12-22 ENCOUNTER — Ambulatory Visit: Payer: Medicare Other | Attending: Internal Medicine

## 2019-12-22 DIAGNOSIS — Z23 Encounter for immunization: Secondary | ICD-10-CM | POA: Insufficient documentation

## 2019-12-22 NOTE — Progress Notes (Signed)
   Covid-19 Vaccination Clinic  Name:  Kimberly Freeman    MRN: [id number] DOB: 1941/06/13  12/22/2019  [name] was observed post Covid-19 immunization for 15 minutes without incident. She was provided with Vaccine Information Sheet and instruction to access the V-Safe system.   [name] was instructed to call 911 with any severe reactions post vaccine: Marland Kitchen Difficulty breathing  . Swelling of face and throat  . A fast heartbeat  . A bad rash all over body  . Dizziness and weakness   Immunizations Administered    Name Date Dose VIS Date Route   Moderna COVID-19 Vaccine 12/22/2019 11:50 AM 0.5 mL 09/22/2019 Intramuscular   Manufacturer: Moderna   Lot: [id number]   NDC: 58309-407-68

## 2019-12-23 LAB — LIPID PANEL
Cholesterol: 147 mg/dL (ref <200)
HDL: 45 mg/dL — ABNORMAL LOW (ref >=50)
LDL Cholesterol (Calc): 80 mg/dL (calc)
Non-HDL Cholesterol (Calc): 102 mg/dL (calc) (ref <130)
Total CHOL/HDL Ratio: 3.3 (calc) (ref <5.0)
Triglycerides: 127 mg/dL (ref <150)

## 2019-12-23 LAB — COMPLETE METABOLIC PANEL WITH GFR
AG Ratio: 1.4 (calc) (ref 1.0–2.5)
ALT: 28 U/L (ref 6–29)
AST: 28 U/L (ref 10–35)
Albumin: 3.8 g/dL (ref 3.6–5.1)
Alkaline phosphatase (APISO): 105 U/L (ref 37–153)
BUN/Creatinine Ratio: 15 (calc) (ref 6–22)
BUN: 17 mg/dL (ref 7–25)
CO2: 31 mmol/L (ref 20–32)
Calcium: 9.2 mg/dL (ref 8.6–10.4)
Chloride: 104 mmol/L (ref 98–110)
Creat: 1.14 mg/dL — ABNORMAL HIGH (ref 0.60–0.88)
GFR, Est African American: 53 mL/min/{1.73_m2} — ABNORMAL LOW (ref >=60)
GFR, Est Non African American: 45 mL/min/{1.73_m2} — ABNORMAL LOW (ref >=60)
Globulin: 2.8 g/dL (calc) (ref 1.9–3.7)
Glucose, Bld: 304 mg/dL — ABNORMAL HIGH (ref 65–139)
Potassium: 4.2 mmol/L (ref 3.5–5.3)
Sodium: 141 mmol/L (ref 135–146)
Total Bilirubin: 0.5 mg/dL (ref 0.2–1.2)
Total Protein: 6.6 g/dL (ref 6.1–8.1)

## 2019-12-23 LAB — TEST AUTHORIZATION

## 2019-12-23 LAB — HEMOGLOBIN A1C
Hgb A1c MFr Bld: 9.6 % of total Hgb — ABNORMAL HIGH (ref <5.7)
Mean Plasma Glucose: 229 (calc)
eAG (mmol/L): 12.7 (calc)

## 2019-12-23 LAB — T3, FREE: T3, Free: 3 pg/mL (ref 2.3–4.2)

## 2019-12-23 LAB — T4, FREE: Free T4: 1 ng/dL (ref 0.8–1.8)

## 2019-12-24 ENCOUNTER — Other Ambulatory Visit: Payer: Self-pay | Admitting: Family Medicine

## 2019-12-24 DIAGNOSIS — E1065 Type 1 diabetes mellitus with hyperglycemia: Secondary | ICD-10-CM

## 2019-12-24 NOTE — Progress Notes (Signed)
Pt advised of recommendations with verbal understanding spoke with [facility] office they are calling to make appt today she is going out of town for a couple of weeks and stated she would set up this appt as soon as she gets back in town

## 2019-12-31 ENCOUNTER — Other Ambulatory Visit: Payer: Self-pay | Admitting: Family Medicine

## 2020-01-07 ENCOUNTER — Ambulatory Visit: Payer: Medicare Other | Admitting: Family Medicine

## 2020-01-19 ENCOUNTER — Ambulatory Visit: Payer: Medicare Other | Attending: Internal Medicine

## 2020-01-19 ENCOUNTER — Other Ambulatory Visit: Payer: Self-pay | Admitting: Family Medicine

## 2020-01-19 DIAGNOSIS — Z23 Encounter for immunization: Secondary | ICD-10-CM

## 2020-02-10 ENCOUNTER — Other Ambulatory Visit (HOSPITAL_COMMUNITY): Payer: Self-pay | Admitting: *Deleted

## 2020-02-10 DIAGNOSIS — D500 Iron deficiency anemia secondary to blood loss (chronic): Secondary | ICD-10-CM

## 2020-02-10 DIAGNOSIS — D649 Anemia, unspecified: Secondary | ICD-10-CM

## 2020-02-11 ENCOUNTER — Inpatient Hospital Stay (HOSPITAL_COMMUNITY): Payer: Medicare Other | Attending: Hematology

## 2020-02-18 ENCOUNTER — Encounter: Payer: Self-pay | Admitting: Orthopaedic Surgery

## 2020-02-18 ENCOUNTER — Ambulatory Visit (INDEPENDENT_AMBULATORY_CARE_PROVIDER_SITE_OTHER): Payer: Medicare Other | Admitting: Orthopaedic Surgery

## 2020-02-18 ENCOUNTER — Ambulatory Visit (HOSPITAL_COMMUNITY): Payer: Medicare Other | Admitting: Nurse Practitioner

## 2020-02-18 ENCOUNTER — Other Ambulatory Visit: Payer: Self-pay

## 2020-02-18 VITALS — BP 177/71 | HR 56 | Temp 97.3°F | Ht 67.0 in | Wt 202.5 lb

## 2020-02-18 DIAGNOSIS — M25562 Pain in left knee: Secondary | ICD-10-CM | POA: Diagnosis not present

## 2020-02-18 DIAGNOSIS — G8929 Other chronic pain: Secondary | ICD-10-CM | POA: Diagnosis not present

## 2020-02-18 DIAGNOSIS — M25561 Pain in right knee: Secondary | ICD-10-CM

## 2020-02-18 NOTE — Progress Notes (Signed)
PROCEDURE NOTE:  The patient requests injections of the right knee , verbal consent was obtained.  The right knee was prepped appropriately after time out was performed.   Sterile technique was observed and injection of 1 cc of Depo-Medrol 40 mg with several cc's of plain xylocaine. Anesthesia was provided by ethyl chloride and a 20-gauge needle was used to inject the knee area. The injection was tolerated well.  A band aid dressing was applied.  The patient was advised to apply ice later today and tomorrow to the injection sight as needed.  PROCEDURE NOTE:  The patient requests injections of the left knee , verbal consent was obtained.  The left knee was prepped appropriately after time out was performed.   Sterile technique was observed and injection of 1 cc of Depo-Medrol 40 mg with several cc's of plain xylocaine. Anesthesia was provided by ethyl chloride and a 20-gauge needle was used to inject the knee area. The injection was tolerated well.  A band aid dressing was applied.  The patient was advised to apply ice later today and tomorrow to the injection sight as needed.  I will see as needed.  Electronically Signed Darreld Mclean, MD 4/29/20218:59 AM

## 2020-02-23 DIAGNOSIS — B351 Tinea unguium: Secondary | ICD-10-CM | POA: Diagnosis not present

## 2020-02-23 DIAGNOSIS — E1142 Type 2 diabetes mellitus with diabetic polyneuropathy: Secondary | ICD-10-CM | POA: Diagnosis not present

## 2020-02-25 ENCOUNTER — Other Ambulatory Visit: Payer: Self-pay

## 2020-02-25 ENCOUNTER — Inpatient Hospital Stay (HOSPITAL_COMMUNITY): Payer: Medicare Other | Attending: Hematology

## 2020-02-25 DIAGNOSIS — D649 Anemia, unspecified: Secondary | ICD-10-CM | POA: Insufficient documentation

## 2020-02-25 DIAGNOSIS — K2970 Gastritis, unspecified, without bleeding: Secondary | ICD-10-CM | POA: Diagnosis not present

## 2020-02-25 DIAGNOSIS — K648 Other hemorrhoids: Secondary | ICD-10-CM | POA: Insufficient documentation

## 2020-02-25 DIAGNOSIS — K644 Residual hemorrhoidal skin tags: Secondary | ICD-10-CM | POA: Diagnosis not present

## 2020-02-25 DIAGNOSIS — N189 Chronic kidney disease, unspecified: Secondary | ICD-10-CM | POA: Diagnosis not present

## 2020-02-25 DIAGNOSIS — K2980 Duodenitis without bleeding: Secondary | ICD-10-CM | POA: Diagnosis not present

## 2020-02-25 DIAGNOSIS — D500 Iron deficiency anemia secondary to blood loss (chronic): Secondary | ICD-10-CM

## 2020-02-25 LAB — CBC WITH DIFFERENTIAL/PLATELET
Abs Immature Granulocytes: 0.01 10*3/uL (ref 0.00–0.07)
Basophils Absolute: 0 10*3/uL (ref 0.0–0.1)
Basophils Relative: 1 %
Eosinophils Absolute: 0.2 10*3/uL (ref 0.0–0.5)
Eosinophils Relative: 2 %
HCT: 40.3 % (ref 36.0–46.0)
Hemoglobin: 12.6 g/dL (ref 12.0–15.0)
Immature Granulocytes: 0 %
Lymphocytes Relative: 31 %
Lymphs Abs: 2.6 10*3/uL (ref 0.7–4.0)
MCH: 28.9 pg (ref 26.0–34.0)
MCHC: 31.3 g/dL (ref 30.0–36.0)
MCV: 92.4 fL (ref 80.0–100.0)
Monocytes Absolute: 0.8 10*3/uL (ref 0.1–1.0)
Monocytes Relative: 10 %
Neutro Abs: 4.7 10*3/uL (ref 1.7–7.7)
Neutrophils Relative %: 56 %
Platelets: 211 10*3/uL (ref 150–400)
RBC: 4.36 MIL/uL (ref 3.87–5.11)
RDW: 13.3 % (ref 11.5–15.5)
WBC: 8.3 10*3/uL (ref 4.0–10.5)
nRBC: 0 % (ref 0.0–0.2)

## 2020-02-25 LAB — COMPREHENSIVE METABOLIC PANEL
ALT: 31 U/L (ref 0–44)
AST: 21 U/L (ref 15–41)
Albumin: 3.9 g/dL (ref 3.5–5.0)
Alkaline Phosphatase: 89 U/L (ref 38–126)
Anion gap: 10 (ref 5–15)
BUN: 25 mg/dL — ABNORMAL HIGH (ref 8–23)
CO2: 27 mmol/L (ref 22–32)
Calcium: 9.2 mg/dL (ref 8.9–10.3)
Chloride: 102 mmol/L (ref 98–111)
Creatinine, Ser: 1.18 mg/dL — ABNORMAL HIGH (ref 0.44–1.00)
GFR calc Af Amer: 50 mL/min — ABNORMAL LOW (ref >60)
GFR calc non Af Amer: 44 mL/min — ABNORMAL LOW (ref >60)
Glucose, Bld: 280 mg/dL — ABNORMAL HIGH (ref 70–99)
Potassium: 4.3 mmol/L (ref 3.5–5.1)
Sodium: 139 mmol/L (ref 135–145)
Total Bilirubin: 0.7 mg/dL (ref 0.3–1.2)
Total Protein: 6.8 g/dL (ref 6.5–8.1)

## 2020-02-25 LAB — VITAMIN D 25 HYDROXY (VIT D DEFICIENCY, FRACTURES): Vit D, 25-Hydroxy: 32.19 ng/mL (ref 30–100)

## 2020-02-25 LAB — VITAMIN B12: Vitamin B-12: 2875 pg/mL — ABNORMAL HIGH (ref 180–914)

## 2020-02-25 LAB — FOLATE: Folate: 93.3 ng/mL (ref >5.9)

## 2020-02-25 LAB — IRON AND TIBC
Iron: 107 ug/dL (ref 28–170)
Saturation Ratios: 32 % — ABNORMAL HIGH (ref 10.4–31.8)
TIBC: 332 ug/dL (ref 250–450)
UIBC: 225 ug/dL

## 2020-02-25 LAB — FERRITIN: Ferritin: 118 ng/mL (ref 11–307)

## 2020-02-26 ENCOUNTER — Inpatient Hospital Stay (HOSPITAL_BASED_OUTPATIENT_CLINIC_OR_DEPARTMENT_OTHER): Payer: Medicare Other | Admitting: Nurse Practitioner

## 2020-02-26 DIAGNOSIS — K648 Other hemorrhoids: Secondary | ICD-10-CM | POA: Diagnosis not present

## 2020-02-26 DIAGNOSIS — K2970 Gastritis, unspecified, without bleeding: Secondary | ICD-10-CM | POA: Diagnosis not present

## 2020-02-26 DIAGNOSIS — D500 Iron deficiency anemia secondary to blood loss (chronic): Secondary | ICD-10-CM

## 2020-02-26 DIAGNOSIS — K2980 Duodenitis without bleeding: Secondary | ICD-10-CM | POA: Diagnosis not present

## 2020-02-26 DIAGNOSIS — K644 Residual hemorrhoidal skin tags: Secondary | ICD-10-CM | POA: Diagnosis not present

## 2020-02-26 DIAGNOSIS — N189 Chronic kidney disease, unspecified: Secondary | ICD-10-CM | POA: Diagnosis not present

## 2020-02-26 DIAGNOSIS — D649 Anemia, unspecified: Secondary | ICD-10-CM | POA: Diagnosis not present

## 2020-02-26 NOTE — Assessment & Plan Note (Signed)
1.  Normocytic anemia: -Previously thought to be from chronic blood loss.  There is also a component of CKD. -EGD on 07/18/2017 showed gastritis, duodenitis with no H. pylori. -Colonoscopy on 07/18/2017 showed internal and external hemorrhoids. -She denies any bright red bleeding per rectum or melena.  No B symptoms or recent hospitalizations. -Labs done on 02/25/2020 showed hemoglobin 12.6, ferritin 118, percent saturation 32 -She does not need any IV iron at this time. -She will follow-up in 6 months with repeat labs.

## 2020-02-26 NOTE — Patient Instructions (Signed)
[facility] at [facility] Discharge Instructions  Follow up in 6 months with labs    Thank you for choosing [facility] at [facility] to provide your oncology and hematology care.  To afford each patient quality time with our provider, please arrive at least 15 minutes before your scheduled appointment time.   If you have a lab appointment with the Cancer Center please come in thru the Main Entrance and check in at the main information desk.  You need to re-schedule your appointment should you arrive 10 or more minutes late.  We strive to give you quality time with our providers, and arriving late affects you and other patients whose appointments are after yours.  Also, if you no show three or more times for appointments you may be dismissed from the clinic at the providers discretion.     Again, thank you for choosing [facility].  Our hope is that these requests will decrease the amount of time that you wait before being seen by our physicians.       _____________________________________________________________  Should you have questions after your visit to [facility], please contact our office at [phone] between the hours of 8:00 a.m. and 4:30 p.m.  Voicemails left after 4:00 p.m. will not be returned until the following business day.  For prescription refill requests, have your pharmacy contact our office and allow 72 hours.    Due to Covid, you will need to wear a mask upon entering the hospital. If you do not have a mask, a mask will be given to you at the Main Entrance upon arrival. For doctor visits, patients may have 1 support person with them. For treatment visits, patients can not have anyone with them due to social distancing guidelines and our immunocompromised population.      

## 2020-02-26 NOTE — Progress Notes (Signed)
[facility] [address], [place] [address]   CLINIC:  Medical Oncology/Hematology  PCP:  [name], MD [address] [place] [address] [phone]   REASON FOR VISIT: Follow-up for iron deficiency anemia  CURRENT THERAPY: Observation   INTERVAL HISTORY:  Kimberly Freeman 79 y.o. female returns for routine follow-up for iron deficiency anemia. Patient reports she is doing well since her last visit.  She has no complaints at this time.  She denies any bright red bleeding per rectum or melena.  She denies any easy bruising or bleeding. Denies any nausea, vomiting, or diarrhea. Denies any new pains. Had not noticed any recent bleeding such as epistaxis, hematuria or hematochezia. Denies recent chest pain on exertion, shortness of breath on minimal exertion, pre-syncopal episodes, or palpitations. Denies any numbness or tingling in hands or feet. Denies any recent fevers, infections, or recent hospitalizations. Patient reports appetite at 100% and energy level at 50%.  She is eating well maintain her weight at this time.   REVIEW OF SYSTEMS:  Review of Systems  All other systems reviewed and are negative.    PAST MEDICAL/SURGICAL HISTORY:  Past Medical History:  Diagnosis Date  . Arthritis   . CAD (coronary artery disease) 08/2019   60% RI with other 20-30% lesions at cath  . Diabetes mellitus, type 2 (HCC)   . Essential hypertension   . GERD (gastroesophageal reflux disease)   . Hyperlipidemia    Past Surgical History:  Procedure Laterality Date  . ABDOMINAL HYSTERECTOMY    . BIOPSY  07/18/2017   Procedure: BIOPSY;  Surgeon: [name], MD;  Location: [facility];  Service: Endoscopy;;  gastric  . CHOLECYSTECTOMY  2004  . COLONOSCOPY N/A 07/18/2017   Dr. [name]: Internal and external hemorrhoids.  No future screening/surveillance colonoscopies due to age.  . ESOPHAGOGASTRODUODENOSCOPY N/A 07/18/2017   Dr. [name]: Small  hiatal hernia, patchy mild inflammation characterized by congestion, erosions, erythema in the cardia, gastric body and antrum.  Diffuse moderate inflammation in the duodenal bulb and second portion duodenum.  Biopsies benign.  No H. pylori.  Marland Kitchen LEFT HEART CATH AND CORONARY ANGIOGRAPHY N/A 09/21/2019   Procedure: LEFT HEART CATH AND CORONARY ANGIOGRAPHY;  Surgeon: [name], MD;  Location: [facility] INVASIVE CV LAB;  Service: Cardiovascular;  Laterality: N/A;     SOCIAL HISTORY:  Social History   Socioeconomic History  . Marital status: Married    Spouse name: [name]   . Number of children: 0  . Years of education: 12+  . Highest education level: 12th grade  Occupational History  . Occupation: retired   Tobacco Use  . Smoking status: Never Smoker  . Smokeless tobacco: Never Used  Substance and Sexual Activity  . Alcohol use: No    Alcohol/week: 0.0 standard drinks  . Drug use: No  . Sexual activity: Not Currently    Partners: Male  Other Topics Concern  . Not on file  Social History Narrative   Lives alone with husband    Social Determinants of Health   Financial Resource Strain:   . Difficulty of Paying Living Expenses:   Food Insecurity:   . Worried About Programme researcher, broadcasting/film/video in the Last Year:   . Barista in the Last Year:   Transportation Needs:   . Freight forwarder (Medical):   Marland Kitchen Lack of Transportation (Non-Medical):   Physical Activity:   . Days of Exercise  per Week:   . Minutes of Exercise per Session:   Stress:   . Feeling of Stress :   Social Connections:   . Frequency of Communication with Friends and Family:   . Frequency of Social Gatherings with Friends and Family:   . Attends Religious Services:   . Active Member of Clubs or Organizations:   . Attends Archivist Meetings:   Marland Kitchen Marital Status:   Intimate Partner Violence:   . Fear of Current or Ex-Partner:   . Emotionally Abused:   Marland Kitchen Physically Abused:   . Sexually Abused:       FAMILY HISTORY:  Family History  Problem Relation Age of Onset  . Leukemia Mother   . Cancer Mother   . Prostate cancer Father   . Lung cancer Sister 86  . Diabetes Sister   . Colon cancer Neg Hx     CURRENT MEDICATIONS:  Outpatient Encounter Medications as of 02/26/2020  Medication Sig  . amLODipine (NORVASC) 2.5 MG tablet TAKE 1 TABLET DAILY (Patient taking differently: Take 5 mg by mouth daily. )  . aspirin 81 MG chewable tablet Chew 1 tablet (81 mg total) by mouth daily.  Marland Kitchen atorvastatin (LIPITOR) 80 MG tablet Take 1 tablet (80 mg total) by mouth daily at 6 PM.  . calcium-vitamin D (OSCAL WITH D) 500-200 MG-UNIT tablet Take 1 tablet by mouth.  Marland Kitchen glipiZIDE (GLIPIZIDE XL) 10 MG 24 hr tablet Take 1 tablet (10 mg total) by mouth daily with breakfast.  . glucose blood (ONE TOUCH ULTRA TEST) test strip USE AS INSTRUCTED FOR TWICE DAILY TESTING  . Insulin Glargine (BASAGLAR KWIKPEN) 100 UNIT/ML SOPN INJECT 50 UNITS SUBCUTANEOUSLY AT BEDTIME (DISCONTINUE LEVEMIR)  . Insulin Pen Needle (BD PEN NEEDLE MICRO U/F) 32G X 6 MM MISC USE TO INJECT INSULIN DAILY  . irbesartan (AVAPRO) 300 MG tablet TAKE 1 TABLET DAILY  . Lancets (ONETOUCH ULTRASOFT) lancets USE AS DIRECTED TWICE DAILY  . metoprolol tartrate (LOPRESSOR) 100 MG tablet Take 1 tablet (100 mg total) by mouth 2 (two) times daily.  . Multiple Vitamin (MULTIVITAMIN WITH MINERALS) TABS tablet Take 1 tablet by mouth daily.  . pantoprazole (PROTONIX) 40 MG tablet TAKE 1 TABLET BY MOUTH 30 MINUTES BEFORE FIRST MEAL  . acetaminophen (TYLENOL) 500 MG tablet Take 500 mg by mouth daily as needed for mild pain or moderate pain. For pain   . polyethylene glycol (MIRALAX / GLYCOLAX) 17 g packet Take 17 g by mouth daily as needed.   No facility-administered encounter medications on file as of 02/26/2020.    ALLERGIES:  Allergies  Allergen Reactions  . Poison Oak Extract [Poison Oak Extract] Hives  . Sulfonamide Derivatives Itching    Burning  sensation all over body     PHYSICAL EXAM:  ECOG Performance status: 1  Vitals:   02/26/20 0932 02/26/20 0935  BP: (!) 170/54 (!) 157/55  Pulse: 60   Resp: 18   Temp: (!) 96.2 F (35.7 C)   SpO2: 97%    Filed Weights   02/26/20 0932  Weight: 201 lb (91.2 kg)   Physical Exam Constitutional:      Appearance: Normal appearance. She is normal weight.  Cardiovascular:     Rate and Rhythm: Normal rate and regular rhythm.     Heart sounds: Normal heart sounds.  Pulmonary:     Effort: Pulmonary effort is normal.     Breath sounds: Normal breath sounds.  Abdominal:     General: Bowel sounds  are normal.     Palpations: Abdomen is soft.  Musculoskeletal:        General: Normal range of motion.  Skin:    General: Skin is warm.  Neurological:     Mental Status: She is alert and oriented to person, place, and time. Mental status is at baseline.  Psychiatric:        Mood and Affect: Mood normal.        Behavior: Behavior normal.        Thought Content: Thought content normal.        Judgment: Judgment normal.      LABORATORY DATA:  I have reviewed the labs as listed.  CBC    Component Value Date/Time   WBC 8.3 02/25/2020 1312   RBC 4.36 02/25/2020 1312   HGB 12.6 02/25/2020 1312   HCT 40.3 02/25/2020 1312   PLT 211 02/25/2020 1312   MCV 92.4 02/25/2020 1312   MCH 28.9 02/25/2020 1312   MCHC 31.3 02/25/2020 1312   RDW 13.3 02/25/2020 1312   LYMPHSABS 2.6 02/25/2020 1312   MONOABS 0.8 02/25/2020 1312   EOSABS 0.2 02/25/2020 1312   BASOSABS 0.0 02/25/2020 1312   CMP Latest Ref Rng & Units 02/25/2020 12/15/2019 09/29/2019  Glucose 70 - 99 mg/dL 517(G) 017(C) 944(H)  BUN 8 - 23 mg/dL 67(R) 17 18  Creatinine 0.44 - 1.00 mg/dL 9.16(B) 8.46(K) 5.99(J)  Sodium 135 - 145 mmol/L 139 141 142  Potassium 3.5 - 5.1 mmol/L 4.3 4.2 4.2  Chloride 98 - 111 mmol/L 102 104 107  CO2 22 - 32 mmol/L 27 31 27   Calcium 8.9 - 10.3 mg/dL 9.2 9.2 9.4  Total Protein 6.5 - 8.1 g/dL 6.8 6.6 -   Total Bilirubin 0.3 - 1.2 mg/dL 0.7 0.5 -  Alkaline Phos 38 - 126 U/L 89 - -  AST 15 - 41 U/L 21 28 -  ALT 0 - 44 U/L 31 28 -    All questions were answered to patient's stated satisfaction. Encouraged patient to call with any new concerns or questions before his next visit to the cancer center and we can certain see him sooner, if needed.     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  Normocytic anemia: -Previously thought to be from chronic blood loss.  There is also a component of CKD. -EGD on 07/18/2017 showed gastritis, duodenitis with no H. pylori. -Colonoscopy on 07/18/2017 showed internal and external hemorrhoids. -She denies any bright red bleeding per rectum or melena.  No B symptoms or recent hospitalizations. -Labs done on 02/25/2020 showed hemoglobin 12.6, ferritin 118, percent saturation 32 -She does not need any IV iron at this time. -She will follow-up in 6 months with repeat labs.     Orders placed this encounter:  Orders Placed This Encounter  Procedures  . Lactate dehydrogenase  . CBC with Differential/Platelet  . Comprehensive metabolic panel  . Ferritin  . Iron and TIBC  . Vitamin B12  . VITAMIN D 25 Hydroxy (Vit-D Deficiency, Fractures)  . Folate      04/26/2020, FNP-C [facility] [phone]

## 2020-03-01 DIAGNOSIS — E119 Type 2 diabetes mellitus without complications: Secondary | ICD-10-CM | POA: Diagnosis not present

## 2020-03-01 LAB — HM DIABETES EYE EXAM

## 2020-03-02 ENCOUNTER — Encounter: Payer: Self-pay | Admitting: *Deleted

## 2020-03-31 ENCOUNTER — Other Ambulatory Visit: Payer: Self-pay

## 2020-03-31 ENCOUNTER — Ambulatory Visit: Payer: Medicare Other | Admitting: Family Medicine

## 2020-03-31 ENCOUNTER — Encounter: Payer: Self-pay | Admitting: Family Medicine

## 2020-03-31 VITALS — BP 132/74 | HR 63 | Temp 97.0°F | Ht 67.0 in | Wt 203.1 lb

## 2020-03-31 DIAGNOSIS — L237 Allergic contact dermatitis due to plants, except food: Secondary | ICD-10-CM

## 2020-03-31 MED ORDER — BETAMETHASONE DIPROPIONATE 0.05 % EX CREA: TOPICAL_CREAM | Freq: Two times a day (BID) | CUTANEOUS | 0 refills | Status: DC

## 2020-03-31 NOTE — Progress Notes (Signed)
Subjective:  Patient ID: Kimberly Freeman, female    DOB: 13-Sep-1941  Age: 79 y.o. MRN: [id number]  CC:  Chief Complaint  Patient presents with  . Rash    rash on hand and arm x1 week was planting flowers outside before noticing it but didnt get into anything that she knows of does look like its starting to get better      HPI  HPI   [name] is an 79 year old female patient Dr. Moshe Cipro.  Who presents with a rash on her hand and arm left side for the last week.  She was planting flowers outside and had not noticed that there was any poison oak or poison sumac or poison ivy around her however she must have been exposed to it as she developed the rash and this is similar to what she experiences when she is exposed to poison oak.  She admits to not wearing gloves when she was outside.  She denies having any other areas where the rash spread.  She has been using Benadryl to help with the itching.  But she still has some discomfort.  She denies having any leakage of the lesions or bleeding of the lesions.  Denies having any fevers, chills, cough, shortness of breath.  Today patient denies signs and symptoms of COVID 19 infection including fever, chills, cough, shortness of breath, and headache. Past Medical, Surgical, Social History, Allergies, and Medications have been Reviewed.   Past Medical History:  Diagnosis Date  . Arthritis   . CAD (coronary artery disease) 08/2019   60% RI with other 20-30% lesions at cath  . Diabetes mellitus, type 2 ([facility])   . Essential hypertension   . GERD (gastroesophageal reflux disease)   . Hyperlipidemia     Current Meds  Medication Sig  . acetaminophen (TYLENOL) 500 MG tablet Take 500 mg by mouth daily as needed for mild pain or moderate pain. For pain   . amLODipine (NORVASC) 2.5 MG tablet TAKE 1 TABLET DAILY (Patient taking differently: Take 5 mg by mouth daily. )  . aspirin 81 MG chewable tablet Chew 1 tablet (81 mg total) by mouth daily.    Marland Kitchen atorvastatin (LIPITOR) 80 MG tablet Take 1 tablet (80 mg total) by mouth daily at 6 PM.  . calcium-vitamin D (OSCAL WITH D) 500-200 MG-UNIT tablet Take 1 tablet by mouth.  Marland Kitchen glipiZIDE (GLIPIZIDE XL) 10 MG 24 hr tablet Take 1 tablet (10 mg total) by mouth daily with breakfast.  . glucose blood (ONE TOUCH ULTRA TEST) test strip USE AS INSTRUCTED FOR TWICE DAILY TESTING  . Insulin Glargine (BASAGLAR KWIKPEN) 100 UNIT/ML SOPN INJECT 50 UNITS SUBCUTANEOUSLY AT BEDTIME (DISCONTINUE LEVEMIR)  . Insulin Pen Needle (BD PEN NEEDLE MICRO U/F) 32G X 6 MM MISC USE TO INJECT INSULIN DAILY  . irbesartan (AVAPRO) 300 MG tablet TAKE 1 TABLET DAILY  . Lancets (ONETOUCH ULTRASOFT) lancets USE AS DIRECTED TWICE DAILY  . Multiple Vitamin (MULTIVITAMIN WITH MINERALS) TABS tablet Take 1 tablet by mouth daily.  . pantoprazole (PROTONIX) 40 MG tablet TAKE 1 TABLET BY MOUTH 30 MINUTES BEFORE FIRST MEAL  . polyethylene glycol (MIRALAX / GLYCOLAX) 17 g packet Take 17 g by mouth daily as needed.    ROS:  Review of Systems  Constitutional: Negative.   HENT: Negative.   Eyes: Negative.   Respiratory: Negative.   Cardiovascular: Negative.   Gastrointestinal: Negative.   Genitourinary: Negative.   Musculoskeletal: Negative.   Skin: Positive for  rash.  Neurological: Negative.   Endo/Heme/Allergies: Negative.   Psychiatric/Behavioral: Negative.   All other systems reviewed and are negative.    Objective:   Today's Vitals: BP 132/74 (BP Location: Right Arm, Patient Position: Sitting, Cuff Size: Normal)   Pulse 63   Temp (!) 97 F (36.1 C) (Temporal)   Ht 5\' 7"  (1.702 m)   Wt 203 lb 1.9 oz (92.1 kg)   SpO2 96%   BMI 31.81 kg/m  Vitals with BMI 03/31/2020 02/26/2020 02/26/2020  Height 5\' 7"  - -  Weight 203 lbs 2 oz - 201 lbs  BMI 31.81 - 31.47  Systolic 132 157 04/27/2020  Diastolic 74 55 54  Pulse 63 - 60     Physical Exam Vitals and nursing note reviewed.  Constitutional:      Appearance: Normal  appearance. She is well-developed and well-groomed. She is obese.  HENT:     Head: Normocephalic and atraumatic.     Right Ear: External ear normal.     Left Ear: External ear normal.     Mouth/Throat:     Comments: Mask in place Eyes:     General:        Right eye: No discharge.        Left eye: No discharge.     Conjunctiva/sclera: Conjunctivae normal.  Cardiovascular:     Rate and Rhythm: Normal rate and regular rhythm.     Pulses: Normal pulses.     Heart sounds: Normal heart sounds.  Pulmonary:     Effort: Pulmonary effort is normal.     Breath sounds: Normal breath sounds.  Musculoskeletal:        General: Normal range of motion.     Cervical back: Normal range of motion and neck supple.  Skin:    General: Skin is warm.     Findings: Rash present. Rash is purpuric and vesicular.     Comments: Across upper outer left arm and across the knuckles and posterior side of hand.  Neurological:     General: No focal deficit present.     Mental Status: She is alert and oriented to person, place, and time.  Psychiatric:        Attention and Perception: Attention normal.        Mood and Affect: Mood normal.        Speech: Speech normal.        Behavior: Behavior normal. Behavior is cooperative.        Thought Content: Thought content normal.        Cognition and Memory: Cognition normal.        Judgment: Judgment normal.     Assessment   1. Poison oak dermatitis     Tests ordered No orders of the defined types were placed in this encounter.    Plan: Please see assessment and plan per problem list above.   Meds ordered this encounter  Medications  . betamethasone dipropionate 0.05 % cream    Sig: Apply topically 2 (two) times daily.    Dispense:  30 g    Refill:  0    Order Specific Question:   Supervising Provider    Answer:      Patient to follow-up in 05/23/2020   Genia Harold, NP

## 2020-03-31 NOTE — Assessment & Plan Note (Signed)
Allergic reaction, provided with cortisone steroid cream.  No infection noted.  Advised for her to contact the office if any infection/redness/fever/chills or swelling or spread has occurred after 5 days of steroid cream use.  Encouraged not to go past 7 days of use of this cream. Reviewed side effects, risks and benefits of medication.   Patient acknowledged agreement and understanding of the plan.

## 2020-03-31 NOTE — Patient Instructions (Addendum)
I appreciate the opportunity to provide you with care for your health and wellness. Today we discussed: poison oak rash   Follow up: 05/23/2020 as scheduled  No labs or referrals today  Please use cream as directed on box  Please continue to practice social distancing to keep you, your family, and our community safe.  If you must go out, please wear a mask and practice good handwashing.  It was a pleasure to see you and I look forward to continuing to work together on your health and well-being. Please do not hesitate to call the office if you need care or have questions about your care.  Have a wonderful day and week. With Gratitude, Tereasa Coop, DNP, AGNP-BC

## 2020-04-12 ENCOUNTER — Ambulatory Visit: Payer: Medicare Other | Admitting: Family Medicine

## 2020-05-10 DIAGNOSIS — E1142 Type 2 diabetes mellitus with diabetic polyneuropathy: Secondary | ICD-10-CM | POA: Diagnosis not present

## 2020-05-10 DIAGNOSIS — B351 Tinea unguium: Secondary | ICD-10-CM | POA: Diagnosis not present

## 2020-05-23 ENCOUNTER — Encounter: Payer: Self-pay | Admitting: Family Medicine

## 2020-05-23 ENCOUNTER — Other Ambulatory Visit: Payer: Self-pay

## 2020-05-23 ENCOUNTER — Ambulatory Visit: Payer: Medicare Other | Admitting: Family Medicine

## 2020-05-23 VITALS — BP 160/70 | HR 58 | Temp 98.3°F | Resp 18 | Ht 67.0 in | Wt 203.1 lb

## 2020-05-23 DIAGNOSIS — I10 Essential (primary) hypertension: Secondary | ICD-10-CM | POA: Diagnosis not present

## 2020-05-23 DIAGNOSIS — M7989 Other specified soft tissue disorders: Secondary | ICD-10-CM

## 2020-05-23 DIAGNOSIS — Z6831 Body mass index (BMI) 31.0-31.9, adult: Secondary | ICD-10-CM

## 2020-05-23 DIAGNOSIS — Z794 Long term (current) use of insulin: Secondary | ICD-10-CM

## 2020-05-23 DIAGNOSIS — E1121 Type 2 diabetes mellitus with diabetic nephropathy: Secondary | ICD-10-CM | POA: Diagnosis not present

## 2020-05-23 DIAGNOSIS — N1832 Chronic kidney disease, stage 3b: Secondary | ICD-10-CM

## 2020-05-23 DIAGNOSIS — E6609 Other obesity due to excess calories: Secondary | ICD-10-CM

## 2020-05-23 DIAGNOSIS — E782 Mixed hyperlipidemia: Secondary | ICD-10-CM | POA: Diagnosis not present

## 2020-05-23 DIAGNOSIS — I739 Peripheral vascular disease, unspecified: Secondary | ICD-10-CM | POA: Diagnosis not present

## 2020-05-23 LAB — POCT GLYCOSYLATED HEMOGLOBIN (HGB A1C)
HbA1c POC (<> result, manual entry): 9.6 % (ref 4.0–5.6)
HbA1c, POC (controlled diabetic range): 9.6 % — AB (ref 0.0–7.0)
HbA1c, POC (prediabetic range): 9.6 % — AB (ref 5.7–6.4)
Hemoglobin A1C: 9.6 % — AB (ref 4.0–5.6)

## 2020-05-23 MED ORDER — SPIRONOLACTONE 50 MG PO TABS
50.0000 mg | ORAL_TABLET | Freq: Every day | ORAL | 2 refills | Status: DC
Start: 2020-05-23 — End: 2020-12-05

## 2020-05-23 NOTE — Assessment & Plan Note (Signed)
Bilateral leg swelling, with reduced pulses and skin changes, c/o rigth calf pain with walking

## 2020-05-23 NOTE — Progress Notes (Signed)
Kimberly Freeman     MRN: [id number]      DOB: 1941-04-04   HPI Kimberly Freeman is here for follow up and re-evaluation of chronic medical conditions, medication management and review of any available recent lab and radiology data.  Preventive health is updated, specifically  Cancer screening and Immunization.   Questions or concerns regarding consultations or procedures which the PT has had in the interim are  addressed. The PT denies any adverse reactions to current medications since the last visit.  Reports marked improvement in blood sugar with no hypoglycemia and numbers seldom over 200, she tests twice, morning sugars are reported averaging around 150 C/o bilateral leg swelling, denies PND or rothopnea  ROS Denies recent fever or chills. Denies sinus pressure, nasal congestion, ear pain or sore throat. Denies chest congestion, productive cough or wheezing. Denies chest pains, palpitations and leg swelling Denies abdominal pain, nausea, vomiting,diarrhea or constipation.   Denies dysuria, frequency, hesitancy or incontinence. Denies uncontrolled  joint pain, swelling and limitation in mobility. Denies headaches, seizures, numbness, or tingling.  Denies skin break down or rash.   PE  BP (!) 160/70    Pulse 58    Temp 98.3 F (36.8 C) (Oral)    Resp 18    Ht 5\' 7"  (1.702 m)    Wt (!) 203 lb 1.9 oz (92.1 kg)    SpO2 94%    BMI 31.81 kg/m   Patient alert and oriented and in no cardiopulmonary distress.  HEENT: No facial asymmetry, EOMI,     Neck supple .  Chest: Clear to auscultation bilaterally.  CVS: S1, S2 no murmurs, no S3.Regular rate.  ABD: Soft non tender.   Ext: No edema  MS: Adequate though reduced  ROM spine, shoulders, hips and knees.  Skin: Intact, no ulcerations or rash noted.  Psych: Good eye contact, normal affect. Memory intact not anxious or depressed appearing.  CNS: CN 2-12 intact, power,  normal throughout.no focal deficits noted.   Assessment &  Plan  PVD (peripheral vascular disease) (HCC) Bilateral leg swelling, with reduced pulses and skin changes, c/o rigth calf pain with walking  Type 2 diabetes mellitus with stage 2 chronic kidney disease, with long-term current use of insulin (HCC) Deteriorated and uncontrolled, needs to be managed by ndo, refer for asap appt Kimberly Freeman is reminded of the importance of commitment to daily physical activity for 30 minutes or more, as able and the need to limit carbohydrate intake to 30 to 60 grams per meal to help with blood sugar control.   The need to take medication as prescribed, test blood sugar as directed, and to call between visits if there is a concern that blood sugar is uncontrolled is also discussed.   Kimberly Freeman is reminded of the importance of daily foot exam, annual eye examination, and good blood sugar, blood pressure and cholesterol control.  Diabetic Labs Latest Ref Rng & Units 05/23/2020 05/23/2020 05/23/2020 05/23/2020 02/25/2020  HbA1c 4.0 - 5.6 % 9.6(A) 9.6(A) 9.6 9.6(A) -  Microalbumin mg/dL - - - - -  Micro/Creat Ratio <30 mcg/mg creat - - - - -  Chol <200 mg/dL - - - - -  HDL > OR = 50 mg/dL - - - - -  Calc LDL mg/dL (calc) - - - - -  Triglycerides <150 mg/dL - - - - -  Creatinine 04/26/2020 - 1.00 mg/dL 6.73) - - - 4.19(F)   BP/Weight 05/24/2020 05/23/2020 03/31/2020 02/26/2020 02/18/2020 12/15/2019  12/01/2019  Systolic BP 138 160 132 157 177 122 168  Diastolic BP 84 70 74 55 71 68 68  Wt. (Lbs) 205 203.12 203.12 201 202.5 202.4 204  BMI 32.11 31.81 31.81 31.48 31.72 31.7 31.95   Foot/eye exam completion dates Latest Ref Rng & Units 03/01/2020 12/15/2019  Eye Exam No Retinopathy No Retinopathy -  Foot exam Order - - -  Foot Form Completion - - Done        Essential hypertension, benign Uncontrolled , add spironolaxcotmne daily DASH diet and commitment to daily physical activity for a minimum of 30 minutes discussed and encouraged, as a part of hypertension management. The  importance of attaining a healthy weight is also discussed.  BP/Weight 05/24/2020 05/23/2020 03/31/2020 02/26/2020 02/18/2020 12/15/2019 12/01/2019  Systolic BP 138 160 132 157 177 122 168  Diastolic BP 84 70 74 55 71 68 68  Wt. (Lbs) 205 203.12 203.12 201 202.5 202.4 204  BMI 32.11 31.81 31.81 31.48 31.72 31.7 31.95       Leg swelling C/o bilateral leg swelling ., persistent, refer to vascular for evaluation  Obese  Patient re-educated about  the importance of commitment to a  minimum of 150 minutes of exercise per week as able.  The importance of healthy food choices with portion control discussed, as well as eating regularly and within a 12 hour window most days. The need to choose "clean , green" food 50 to 75% of the time is discussed, as well as to make water the primary drink and set a goal of 64 ounces water daily.    Weight /BMI 05/24/2020 05/23/2020 03/31/2020  WEIGHT 205 lb 203 lb 1.9 oz 203 lb 1.9 oz  HEIGHT 5\' 7"  5\' 7"  5\' 7"   BMI 32.11 kg/m2 31.81 kg/m2 31.81 kg/m2      Mixed hyperlipidemia Hyperlipidemia:Low fat diet discussed and encouraged.   Lipid Panel  Lab Results  Component Value Date   CHOL 147 12/15/2019   HDL 45 (L) 12/15/2019   LDLCALC 80 12/15/2019   TRIG 127 12/15/2019   CHOLHDL 3.3 12/15/2019   Controlled, no change in medication Updated lab needed at/ before next visit.

## 2020-05-23 NOTE — Patient Instructions (Addendum)
F/U in office with MD in office , re eval blood pressure 3rd week in Septemeber  Glyco Hb in office today, I need to hear report before she leaves Non fast chem 7and EGFR and TSH today   You are referred to Vascular surgeon in [place] to evaluate circulation in in the legs  Please start wearing your compression hose daily to help swelling and circulation  New additional medication for blood pressure is spironolactone 50 mg one daily  It is important that you exercise regularly at least 30 minutes 5 times a week. If you develop chest pain, have severe difficulty breathing, or feel very tired, stop exercising immediately and seek medical attention

## 2020-05-24 ENCOUNTER — Other Ambulatory Visit: Payer: Self-pay | Admitting: *Deleted

## 2020-05-24 ENCOUNTER — Encounter: Payer: Self-pay | Admitting: "Endocrinology

## 2020-05-24 ENCOUNTER — Ambulatory Visit (INDEPENDENT_AMBULATORY_CARE_PROVIDER_SITE_OTHER): Payer: Medicare Other | Admitting: "Endocrinology

## 2020-05-24 ENCOUNTER — Other Ambulatory Visit: Payer: Self-pay

## 2020-05-24 VITALS — BP 138/84 | HR 64 | Ht 67.0 in | Wt 205.0 lb

## 2020-05-24 DIAGNOSIS — N182 Chronic kidney disease, stage 2 (mild): Secondary | ICD-10-CM

## 2020-05-24 DIAGNOSIS — Z794 Long term (current) use of insulin: Secondary | ICD-10-CM | POA: Diagnosis not present

## 2020-05-24 DIAGNOSIS — I10 Essential (primary) hypertension: Secondary | ICD-10-CM

## 2020-05-24 DIAGNOSIS — E782 Mixed hyperlipidemia: Secondary | ICD-10-CM

## 2020-05-24 DIAGNOSIS — E1122 Type 2 diabetes mellitus with diabetic chronic kidney disease: Secondary | ICD-10-CM | POA: Diagnosis not present

## 2020-05-24 LAB — BASIC METABOLIC PANEL
BUN/Creatinine Ratio: 13 (ref 12–28)
BUN: 14 mg/dL (ref 8–27)
CO2: 26 mmol/L (ref 20–29)
Calcium: 9.8 mg/dL (ref 8.7–10.3)
Chloride: 104 mmol/L (ref 96–106)
Creatinine, Ser: 1.09 mg/dL — ABNORMAL HIGH (ref 0.57–1.00)
GFR calc Af Amer: 55 mL/min/{1.73_m2} — ABNORMAL LOW (ref >59)
GFR calc non Af Amer: 48 mL/min/{1.73_m2} — ABNORMAL LOW (ref >59)
Glucose: 185 mg/dL — ABNORMAL HIGH (ref 65–99)
Potassium: 4.6 mmol/L (ref 3.5–5.2)
Sodium: 143 mmol/L (ref 134–144)

## 2020-05-24 LAB — TSH: TSH: 4.36 u[IU]/mL (ref 0.450–4.500)

## 2020-05-24 MED ORDER — GLUCOSE BLOOD VI STRP: ORAL_STRIP | 3 refills | Status: DC

## 2020-05-24 MED ORDER — BD PEN NEEDLE MICRO U/F 32G X 6 MM MISC: 3 refills | Status: DC

## 2020-05-24 MED ORDER — ATORVASTATIN CALCIUM 80 MG PO TABS: 80.0000 mg | ORAL_TABLET | Freq: Every day | ORAL | 1 refills | Status: DC

## 2020-05-24 MED ORDER — GLIPIZIDE ER 5 MG PO TB24: 5.0000 mg | ORAL_TABLET | Freq: Every day | ORAL | 1 refills | Status: DC

## 2020-05-24 MED ORDER — BASAGLAR KWIKPEN 100 UNIT/ML ~~LOC~~ SOPN: 60.0000 [IU] | PEN_INJECTOR | Freq: Every day | SUBCUTANEOUS | 1 refills | Status: DC

## 2020-05-24 NOTE — Patient Instructions (Signed)

## 2020-05-24 NOTE — Progress Notes (Signed)
Endocrinology Consult Note       05/24/2020, 8:50 AM   Subjective:    Patient ID: Kimberly Freeman, female    DOB: 1941/07/24.  Kimberly Freeman is being seen in consultation for management of currently uncontrolled symptomatic diabetes requested by  [name], MD.   Past Medical History:  Diagnosis Date  . Arthritis   . CAD (coronary artery disease) 08/2019   60% RI with other 20-30% lesions at cath  . Diabetes mellitus, type 2 (HCC)   . Essential hypertension   . GERD (gastroesophageal reflux disease)   . Hyperlipidemia     Past Surgical History:  Procedure Laterality Date  . ABDOMINAL HYSTERECTOMY    . BIOPSY  07/18/2017   Procedure: BIOPSY;  Surgeon: [name], MD;  Location: [facility];  Service: Endoscopy;;  gastric  . CHOLECYSTECTOMY  2004  . COLONOSCOPY N/A 07/18/2017   Dr. [name]: Internal and external hemorrhoids.  No future screening/surveillance colonoscopies due to age.  . ESOPHAGOGASTRODUODENOSCOPY N/A 07/18/2017   Dr. [name]: Small hiatal hernia, patchy mild inflammation characterized by congestion, erosions, erythema in the cardia, gastric body and antrum.  Diffuse moderate inflammation in the duodenal bulb and second portion duodenum.  Biopsies benign.  No H. pylori.  Marland Kitchen LEFT HEART CATH AND CORONARY ANGIOGRAPHY N/A 09/21/2019   Procedure: LEFT HEART CATH AND CORONARY ANGIOGRAPHY;  Surgeon: [name], MD;  Location: [facility];  Service: Cardiovascular;  Laterality: N/A;    Social History   Socioeconomic History  . Marital status: Married    Spouse name: [name]   . Number of children: 0  . Years of education: 12+  . Highest education level: 12th grade  Occupational History  . Occupation: retired   Tobacco Use  . Smoking status: Never Smoker  . Smokeless tobacco: Never Used  Vaping Use  . Vaping Use: Never used  Substance and Sexual Activity  .  Alcohol use: No    Alcohol/week: 0.0 standard drinks  . Drug use: No  . Sexual activity: Not Currently    Partners: Male  Other Topics Concern  . Not on file  Social History Narrative   Lives alone with husband    Social Determinants of Health   Financial Resource Strain:   . Difficulty of Paying Living Expenses:   Food Insecurity:   . Worried About Programme researcher, broadcasting/film/video in the Last Year:   . Barista in the Last Year:   Transportation Needs:   . Freight forwarder (Medical):   Marland Kitchen Lack of Transportation (Non-Medical):   Physical Activity:   . Days of Exercise per Week:   . Minutes of Exercise per Session:   Stress:   . Feeling of Stress :   Social Connections:   . Frequency of Communication with Friends and Family:   . Frequency of Social Gatherings with Friends and Family:   . Attends Religious Services:   . Active Member of Clubs or Organizations:   . Attends Banker Meetings:   Marland Kitchen Marital Status:     Family History  Problem Relation  Age of Onset  . Leukemia Mother   . Cancer Mother   . Diabetes Mother   . Prostate cancer Father   . Lung cancer Sister 17  . Diabetes Sister   . Colon cancer Neg Hx     Outpatient Encounter Medications as of 05/24/2020  Medication Sig  . acetaminophen (TYLENOL) 500 MG tablet Take 500 mg by mouth daily as needed for mild pain or moderate pain. For pain   . amLODipine (NORVASC) 2.5 MG tablet TAKE 1 TABLET DAILY (Patient taking differently: Take 5 mg by mouth daily. )  . aspirin 81 MG chewable tablet Chew 1 tablet (81 mg total) by mouth daily.  Marland Kitchen atorvastatin (LIPITOR) 80 MG tablet Take 1 tablet (80 mg total) by mouth daily at 6 PM.  . betamethasone dipropionate 0.05 % cream Apply topically 2 (two) times daily.  . calcium-vitamin D (OSCAL WITH D) 500-200 MG-UNIT tablet Take 1 tablet by mouth.  Marland Kitchen glipiZIDE (GLIPIZIDE XL) 5 MG 24 hr tablet Take 1 tablet (5 mg total) by mouth daily with breakfast.  . glucose blood (ONE  TOUCH ULTRA TEST) test strip USE AS INSTRUCTED FOR TWICE DAILY TESTING  . Insulin Glargine (BASAGLAR KWIKPEN) 100 UNIT/ML Inject 0.6 mLs (60 Units total) into the skin at bedtime.  . Insulin Pen Needle (BD PEN NEEDLE MICRO U/F) 32G X 6 MM MISC USE TO INJECT INSULIN DAILY  . irbesartan (AVAPRO) 300 MG tablet TAKE 1 TABLET DAILY  . Lancets (ONETOUCH ULTRASOFT) lancets USE AS DIRECTED TWICE DAILY  . metoprolol tartrate (LOPRESSOR) 100 MG tablet Take 1 tablet (100 mg total) by mouth 2 (two) times daily.  . Multiple Vitamin (MULTIVITAMIN WITH MINERALS) TABS tablet Take 1 tablet by mouth daily.  . pantoprazole (PROTONIX) 40 MG tablet TAKE 1 TABLET BY MOUTH 30 MINUTES BEFORE FIRST MEAL  . polyethylene glycol (MIRALAX / GLYCOLAX) 17 g packet Take 17 g by mouth daily as needed.  Marland Kitchen spironolactone (ALDACTONE) 50 MG tablet Take 1 tablet (50 mg total) by mouth daily.  . vitamin B-12 (CYANOCOBALAMIN) 500 MCG tablet Take 500 mcg by mouth daily.  . [DISCONTINUED] glipiZIDE (GLIPIZIDE XL) 10 MG 24 hr tablet Take 1 tablet (10 mg total) by mouth daily with breakfast.  . [DISCONTINUED] Insulin Glargine (BASAGLAR KWIKPEN) 100 UNIT/ML SOPN INJECT 50 UNITS SUBCUTANEOUSLY AT BEDTIME (DISCONTINUE LEVEMIR)   No facility-administered encounter medications on file as of 05/24/2020.    ALLERGIES: Allergies  Allergen Reactions  . Poison Oak Extract [Poison Oak Extract] Hives  . Sulfonamide Derivatives Itching    Burning sensation all over body    VACCINATION STATUS: Immunization History  Administered Date(s) Administered  . Fluad Quad(high Dose 65+) 07/27/2019  . Influenza Split 08/01/2011, 07/31/2012  . Influenza Whole 07/29/2007, 07/20/2008, 07/21/2009, 07/19/2010  . Influenza, High Dose Seasonal PF 07/23/2018  . Influenza,inj,Quad PF,6+ Mos 08/07/2013, 06/15/2014, 07/19/2015, 06/28/2016, 07/25/2017  . Moderna SARS-COVID-2 Vaccination 12/22/2019, 01/19/2020  . Pneumococcal Conjugate-13 10/12/2014  .  Pneumococcal Polysaccharide-23 03/16/2004, 08/29/2012  . Td 03/16/2004  . Tdap 03/31/2012  . Zoster 01/02/2007    Diabetes She presents for her initial diabetic visit. She has type 2 diabetes mellitus. Onset time: She was diagnosed at approximate age of 58 years. Her disease course has been worsening. There are no hypoglycemic associated symptoms. Pertinent negatives for hypoglycemia include no confusion, headaches, pallor or seizures. Associated symptoms include fatigue, polydipsia and polyuria. Pertinent negatives for diabetes include no chest pain and no polyphagia. There are no hypoglycemic complications. Symptoms are  worsening. Diabetic complications include nephropathy. Risk factors for coronary artery disease include diabetes mellitus, dyslipidemia, family history, hypertension, obesity, sedentary lifestyle and post-menopausal. Current diabetic treatment includes insulin injections (She is currently on Basaglar 50 units nightly, and glipizide 10 mg XL daily at breakfast.  ). Her weight is fluctuating minimally. She is following a generally unhealthy diet. When asked about meal planning, she reported none. She has had a previous visit with a dietitian. She participates in exercise intermittently. (She did not bring any logs nor meter to review with her today.  Her A1c was 9.6% yesterday May 23, 2020.) An ACE inhibitor/angiotensin II receptor blocker is being taken. She sees a podiatrist.Eye exam is current.  Hyperlipidemia This is a chronic problem. The current episode started more than 1 year ago. Exacerbating diseases include chronic renal disease, diabetes and obesity. Pertinent negatives include no chest pain, myalgias or shortness of breath. Current antihyperlipidemic treatment includes statins. Risk factors for coronary artery disease include diabetes mellitus, dyslipidemia, obesity, hypertension, a sedentary lifestyle and post-menopausal.  Hypertension This is a chronic problem. The current  episode started more than 1 year ago. Pertinent negatives include no chest pain, headaches, palpitations or shortness of breath. Risk factors for coronary artery disease include dyslipidemia, diabetes mellitus, sedentary lifestyle, obesity and post-menopausal state. Past treatments include angiotensin blockers. Identifiable causes of hypertension include chronic renal disease.     Review of Systems  Constitutional: Positive for fatigue. Negative for chills, fever and unexpected weight change.  HENT: Negative for trouble swallowing and voice change.   Eyes: Negative for visual disturbance.  Respiratory: Negative for cough, shortness of breath and wheezing.   Cardiovascular: Negative for chest pain, palpitations and leg swelling.  Gastrointestinal: Negative for diarrhea, nausea and vomiting.  Endocrine: Positive for polydipsia and polyuria. Negative for cold intolerance, heat intolerance and polyphagia.  Musculoskeletal: Negative for arthralgias and myalgias.  Skin: Negative for color change, pallor, rash and wound.  Neurological: Negative for seizures and headaches.  Psychiatric/Behavioral: Negative for confusion and suicidal ideas.    Objective:    Vitals with BMI 05/24/2020 05/23/2020 05/23/2020  Height  -   Weight 205 lbs - 203 lbs 2 oz  BMI 32.1 - 31.81  Systolic 138 160 161  Diastolic 84 70 67  Pulse 64 - 58    BP 138/84   Pulse 64   Ht  (1.702 m)   Wt 205 lb (93 kg)   BMI 32.11 kg/m   Wt Readings from Last 3 Encounters:  05/24/20 205 lb (93 kg)  05/23/20 (!) 203 lb 1.9 oz (92.1 kg)  03/31/20 203 lb 1.9 oz (92.1 kg)     Physical Exam Constitutional:      Appearance: She is well-developed.  HENT:     Head: Normocephalic and atraumatic.  Neck:     Thyroid: No thyromegaly.     Trachea: No tracheal deviation.  Cardiovascular:     Rate and Rhythm: Normal rate and regular rhythm.  Pulmonary:     Effort: Pulmonary effort is normal.     Breath sounds: Normal  breath sounds.  Abdominal:     Tenderness: There is no abdominal tenderness. There is no guarding.  Musculoskeletal:        General: Normal range of motion.     Cervical back: Normal range of motion and neck supple.  Skin:    General: Skin is warm and dry.     Coloration: Skin is not pale.  Findings: No erythema or rash.  Neurological:     Mental Status: She is alert and oriented to person, place, and time.     Cranial Nerves: No cranial nerve deficit.     Coordination: Coordination normal.     Deep Tendon Reflexes: Reflexes are normal and symmetric.  Psychiatric:        Judgment: Judgment normal.       CMP ( most recent) CMP     Component Value Date/Time   NA 143 05/23/2020 1117   K 4.6 05/23/2020 1117   CL 104 05/23/2020 1117   CO2 26 05/23/2020 1117   GLUCOSE 185 (H) 05/23/2020 1117   GLUCOSE 280 (H) 02/25/2020 1312   BUN 14 05/23/2020 1117   CREATININE 1.09 (H) 05/23/2020 1117   CREATININE 1.14 (H) 12/15/2019 1048   CALCIUM 9.8 05/23/2020 1117   PROT 6.8 02/25/2020 1312   ALBUMIN 3.9 02/25/2020 1312   AST 21 02/25/2020 1312   ALT 31 02/25/2020 1312   ALKPHOS 89 02/25/2020 1312   BILITOT 0.7 02/25/2020 1312   GFRNONAA 48 (L) 05/23/2020 1117   GFRNONAA 45 (L) 12/15/2019 1048   GFRAA 55 (L) 05/23/2020 1117   GFRAA 53 (L) 12/15/2019 1048     Diabetic Labs (most recent): Lab Results  Component Value Date   HGBA1C 9.6 (A) 05/23/2020   HGBA1C 9.6 05/23/2020   HGBA1C 9.6 (A) 05/23/2020   HGBA1C 9.6 (A) 05/23/2020     Lipid Panel ( most recent) Lipid Panel     Component Value Date/Time   CHOL 147 12/15/2019 1048   TRIG 127 12/15/2019 1048   HDL 45 (L) 12/15/2019 1048   CHOLHDL 3.3 12/15/2019 1048   VLDL 21 03/22/2017 0803   LDLCALC 80 12/15/2019 1048      Lab Results  Component Value Date   TSH 4.360 05/23/2020   TSH 6.85 (H) 07/24/2019   TSH 5.19 (H) 08/04/2018   TSH 3.81 04/10/2016   TSH 2.410 12/13/2015   TSH 5.289 (H) 11/14/2015   TSH  3.963 06/14/2014   TSH 3.427 06/03/2013   TSH 3.333 01/22/2012   TSH 3.737 05/15/2010   FREET4 1.0 12/15/2019   FREET4 0.8 07/24/2019   FREET4 0.9 08/04/2018   FREET4 0.78 12/13/2015   FREET4 0.84 11/14/2015           Assessment & Plan:   1. Type 2 diabetes mellitus with stage 2 chronic kidney disease, with long-term current use of insulin (HCC)   - Kimberly Freeman has currently uncontrolled symptomatic type 2 DM since  79 years of age,  with most recent A1c of 9.6 %. Recent labs reviewed. She did not bring any logs nor meter to review with her today.  Her A1c was 9.6% yesterday May 23, 2020.  - I had a long discussion with her about the progressive nature of diabetes and the pathology behind its complications. -her diabetes is complicated by stage 2 CKD, obesity /sedentary life, and she remains at a high risk for more acute and chronic complications which include CAD, CVA, CKD, retinopathy, and neuropathy. These are all discussed in detail with her.  - I have counseled her on diet  and weight management  by adopting a carbohydrate restricted/protein rich diet. Patient is encouraged to switch to  unprocessed or minimally processed     complex starch and increased protein intake (animal or plant source), fruits, and vegetables. -  she is advised to stick to a routine mealtimes to eat 3  meals  a day and avoid unnecessary snacks ( to snack only to correct hypoglycemia).   - she admits that there is a room for improvement in her food and drink choices. - Suggestion is made for her to avoid simple carbohydrates  from her diet including Cakes, Sweet Desserts, Ice Cream, Soda (diet and regular), Sweet Tea, Candies, Chips, Cookies, Store Bought Juices, Alcohol in Excess of  1-2 drinks a day, Artificial Sweeteners,  Coffee Creamer, and "Sugar-free" Products. This will help patient to have more stable blood glucose profile and potentially avoid unintended weight gain.  - she will be scheduled  with Norm Salt, RDN, CDE for diabetes education.  - I have approached her with the following individualized plan to manage  her diabetes and patient agrees:   - she will continue to need at least basal insulin in order for her to achieve control of diabetes to target.   -Accordingly, she is advised to increase her Basaglar dose 60 units nightly, associated with strict monitoring of glucose 4 times a day-before meals and at bedtime. - she is warned not to take insulin without proper monitoring per orders. - Adjustment parameters are given to her for hypo and hyperglycemia in writing. - she is encouraged to call clinic for blood glucose levels less than 70 or above 300 mg /dl. - she is advised to lower her glipizide to 5 mg XL p.o. daily at breakfast, patient with history of sulfa allergy.   -She will be considered for low-dose Metformin or incretin therapy during her next visit.   - Specific targets for  A1c;  LDL, HDL,  and Triglycerides were discussed with the patient.  2) Blood Pressure /Hypertension:  her blood pressure is  controlled to target.   she is advised to continue her current medications including irbesartan 300 mg p.o. daily with breakfast . 3) Lipids/Hyperlipidemia:   Review of her recent lipid panel showed  controlled  LDL at 80 .  she  is advised to continue    atorvastatin 80 mg daily at bedtime.  Side effects and precautions discussed with her.  4)  Weight/Diet:  Body mass index is 32.11 kg/m.  -   clearly complicating her diabetes care.   she is  a candidate for weight loss. I discussed with her the fact that loss of 5 - 10% of her  current body weight will have the most impact on her diabetes management.  Exercise, and detailed carbohydrates information provided  -  detailed on discharge instructions.  5) Chronic Care/Health Maintenance:  -she  is on ACEI/ARB and Statin medications and  is encouraged to initiate and continue to follow up with Ophthalmology, Dentist,   Podiatrist at least yearly or according to recommendations, and advised to   stay away from smoking. I have recommended yearly flu vaccine and pneumonia vaccine at least every 5 years; moderate intensity exercise for up to 150 minutes weekly; and  sleep for at least 7 hours a day.  - she is  advised to maintain close follow up with [name], MD for primary care needs, as well as her other providers for optimal and coordinated care.   - Time spent in this patient care: 60 min, of which > 50% was spent in  counseling  her about her currently uncontrolled type 2 diabetes, hyperlipidemia, hypertension and the rest reviewing her blood glucose logs , discussing her hypoglycemia and hyperglycemia episodes, reviewing her current and  previous labs / studies  (  including abstraction from other facilities) and medications  doses and developing a  long term treatment plan based on the latest standards of care/ guidelines; and documenting her care.    Please refer to Patient Instructions for Blood Glucose Monitoring and Insulin/Medications Dosing Guide"  in media tab for additional information. Please  also refer to " Patient Self Inventory" in the Media  tab for reviewed elements of pertinent patient history.  Mauro KaufmannAnne M Raimondo participated in the discussions, expressed understanding, and voiced agreement with the above plans.  All questions were answered to her satisfaction. she is encouraged to contact clinic should she have any questions or concerns prior to her return visit.  Follow up plan: - Return in about 1 week (around 05/31/2020) for F/U with Meter and Logs Only - no Labs.  Kimberly LunchGebre Yamilka Lopiccolo, MD [facility] [address] [place], [place] [address] Phone: [phone]  Fax: [phone]    05/24/2020, 8:50 AM  This note was partially dictated with voice recognition software. Similar sounding words can be transcribed inadequately or may not  be  corrected upon review.

## 2020-05-28 NOTE — Assessment & Plan Note (Signed)
  Patient re-educated about  the importance of commitment to a  minimum of 150 minutes of exercise per week as able.  The importance of healthy food choices with portion control discussed, as well as eating regularly and within a 12 hour window most days. The need to choose "clean , green" food 50 to 75% of the time is discussed, as well as to make water the primary drink and set a goal of 64 ounces water daily.    Weight /BMI 05/24/2020 05/23/2020 03/31/2020  WEIGHT 205 lb 203 lb 1.9 oz 203 lb 1.9 oz  HEIGHT 5\' 7"  5\' 7"  5\' 7"   BMI 32.11 kg/m2 31.81 kg/m2 31.81 kg/m2

## 2020-05-28 NOTE — Assessment & Plan Note (Signed)
C/o bilateral leg swelling ., persistent, refer to vascular for evaluation

## 2020-05-28 NOTE — Assessment & Plan Note (Signed)
Hyperlipidemia:Low fat diet discussed and encouraged.   Lipid Panel  Lab Results  Component Value Date   CHOL 147 12/15/2019   HDL 45 (L) 12/15/2019   LDLCALC 80 12/15/2019   TRIG 127 12/15/2019   CHOLHDL 3.3 12/15/2019   Controlled, no change in medication Updated lab needed at/ before next visit.

## 2020-05-28 NOTE — Assessment & Plan Note (Signed)
Deteriorated and uncontrolled, needs to be managed by ndo, refer for asap appt Kimberly Freeman is reminded of the importance of commitment to daily physical activity for 30 minutes or more, as able and the need to limit carbohydrate intake to 30 to 60 grams per meal to help with blood sugar control.   The need to take medication as prescribed, test blood sugar as directed, and to call between visits if there is a concern that blood sugar is uncontrolled is also discussed.   Kimberly Freeman is reminded of the importance of daily foot exam, annual eye examination, and good blood sugar, blood pressure and cholesterol control.  Diabetic Labs Latest Ref Rng & Units 05/23/2020 05/23/2020 05/23/2020 05/23/2020 02/25/2020  HbA1c 4.0 - 5.6 % 9.6(A) 9.6(A) 9.6 9.6(A) -  Microalbumin mg/dL - - - - -  Micro/Creat Ratio <30 mcg/mg creat - - - - -  Chol <200 mg/dL - - - - -  HDL > OR = 50 mg/dL - - - - -  Calc LDL mg/dL (calc) - - - - -  Triglycerides <150 mg/dL - - - - -  Creatinine 0.17 - 1.00 mg/dL 5.10(C) - - - 5.85(I)   BP/Weight 05/24/2020 05/23/2020 03/31/2020 02/26/2020 02/18/2020 12/15/2019 12/01/2019  Systolic BP 138 160 132 157 177 122 168  Diastolic BP 84 70 74 55 71 68 68  Wt. (Lbs) 205 203.12 203.12 201 202.5 202.4 204  BMI 32.11 31.81 31.81 31.48 31.72 31.7 31.95   Foot/eye exam completion dates Latest Ref Rng & Units 03/01/2020 12/15/2019  Eye Exam No Retinopathy No Retinopathy -  Foot exam Order - - -  Foot Form Completion - - Done

## 2020-05-28 NOTE — Assessment & Plan Note (Signed)
Uncontrolled , add spironolaxcotmne daily DASH diet and commitment to daily physical activity for a minimum of 30 minutes discussed and encouraged, as a part of hypertension management. The importance of attaining a healthy weight is also discussed.  BP/Weight 05/24/2020 05/23/2020 03/31/2020 02/26/2020 02/18/2020 12/15/2019 12/01/2019  Systolic BP 138 160 132 157 177 122 168  Diastolic BP 84 70 74 55 71 68 68  Wt. (Lbs) 205 203.12 203.12 201 202.5 202.4 204  BMI 32.11 31.81 31.81 31.48 31.72 31.7 31.95

## 2020-06-01 ENCOUNTER — Ambulatory Visit (INDEPENDENT_AMBULATORY_CARE_PROVIDER_SITE_OTHER): Payer: Medicare Other | Admitting: "Endocrinology

## 2020-06-01 ENCOUNTER — Encounter: Payer: Self-pay | Admitting: "Endocrinology

## 2020-06-01 ENCOUNTER — Other Ambulatory Visit: Payer: Self-pay

## 2020-06-01 VITALS — BP 144/64 | HR 64 | Ht 67.0 in | Wt 203.2 lb

## 2020-06-01 DIAGNOSIS — N182 Chronic kidney disease, stage 2 (mild): Secondary | ICD-10-CM

## 2020-06-01 DIAGNOSIS — I10 Essential (primary) hypertension: Secondary | ICD-10-CM

## 2020-06-01 DIAGNOSIS — Z794 Long term (current) use of insulin: Secondary | ICD-10-CM

## 2020-06-01 DIAGNOSIS — E782 Mixed hyperlipidemia: Secondary | ICD-10-CM | POA: Diagnosis not present

## 2020-06-01 DIAGNOSIS — E1122 Type 2 diabetes mellitus with diabetic chronic kidney disease: Secondary | ICD-10-CM | POA: Diagnosis not present

## 2020-06-01 MED ORDER — BASAGLAR KWIKPEN 100 UNIT/ML ~~LOC~~ SOPN: 50.0000 [IU] | PEN_INJECTOR | Freq: Every day | SUBCUTANEOUS | 2 refills | Status: DC

## 2020-06-01 NOTE — Progress Notes (Signed)
06/01/2020, 9:15 AM  Endocrinology follow-up note  Subjective:    Patient ID: Kimberly Kaufmannnne M Nhan, female    DOB: [birth year].  Kimberly Freeman is being seen in consultation for management of currently uncontrolled symptomatic diabetes requested by  Kerri PerchesSimpson, Margaret E, MD.   Past Medical History:  Diagnosis Date  . Arthritis   . CAD (coronary artery disease) 08/2019   60% RI with other 20-30% lesions at cath  . Diabetes mellitus, type 2 (HCC)   . Essential hypertension   . GERD (gastroesophageal reflux disease)   . Hyperlipidemia     Past Surgical History:  Procedure Laterality Date  . ABDOMINAL HYSTERECTOMY    . BIOPSY  07/18/2017   Procedure: BIOPSY;  Surgeon: West BaliFields, Sandi L, MD;  Location: [facility];  Service: Endoscopy;;  gastric  . CHOLECYSTECTOMY  2004  . COLONOSCOPY N/A 07/18/2017   Dr. Darrick Pennafields: Internal and external hemorrhoids.  No future screening/surveillance colonoscopies due to age.  . ESOPHAGOGASTRODUODENOSCOPY N/A 07/18/2017   Dr. Darrick Pennafields: Small hiatal hernia, patchy mild inflammation characterized by congestion, erosions, erythema in the cardia, gastric body and antrum.  Diffuse moderate inflammation in the duodenal bulb and second portion duodenum.  Biopsies benign.  No H. pylori.  Marland Kitchen. LEFT HEART CATH AND CORONARY ANGIOGRAPHY N/A 09/21/2019   Procedure: LEFT HEART CATH AND CORONARY ANGIOGRAPHY;  Surgeon: Lennette BihariKelly, Thomas A, MD;  Location: [facility];  Service: Cardiovascular;  Laterality: N/A;    Social History   Socioeconomic History  . Marital status: Married    Spouse name: [name]   . Number of children: 0  . Years of education: 12+  . Highest education level: 12th grade  Occupational History  . Occupation: retired   Tobacco Use  . Smoking status: Never Smoker  . Smokeless tobacco: Never Used  Vaping Use  . Vaping Use: Never used  Substance and Sexual Activity  . Alcohol  use: No    Alcohol/week: 0.0 standard drinks  . Drug use: No  . Sexual activity: Not Currently    Partners: Male  Other Topics Concern  . Not on file  Social History Narrative   Lives alone with husband    Social Determinants of Health   Financial Resource Strain:   . Difficulty of Paying Living Expenses:   Food Insecurity:   . Worried About Programme researcher, broadcasting/film/videounning Out of Food in the Last Year:   . Baristaan Out of Food in the Last Year:   Transportation Needs:   . Freight forwarderLack of Transportation (Medical):   Marland Kitchen. Lack of Transportation (Non-Medical):   Physical Activity:   . Days of Exercise per Week:   . Minutes of Exercise per Session:   Stress:   . Feeling of Stress :   Social Connections:   . Frequency of Communication with Friends and Family:   . Frequency of Social Gatherings with Friends and Family:   . Attends Religious Services:   . Active Member of Clubs or Organizations:   . Attends BankerClub or Organization Meetings:   Marland Kitchen. Marital Status:     Family History  Problem Relation Age of Onset  . Leukemia Mother   . Cancer Mother   .  Diabetes Mother   . Prostate cancer Father   . Lung cancer Sister 24  . Diabetes Sister   . Colon cancer Neg Hx     Outpatient Encounter Medications as of 06/01/2020  Medication Sig  . acetaminophen (TYLENOL) 500 MG tablet Take 500 mg by mouth daily as needed for mild pain or moderate pain. For pain   . amLODipine (NORVASC) 2.5 MG tablet TAKE 1 TABLET DAILY (Patient taking differently: Take 5 mg by mouth daily. )  . aspirin 81 MG chewable tablet Chew 1 tablet (81 mg total) by mouth daily.  Marland Kitchen atorvastatin (LIPITOR) 80 MG tablet Take 1 tablet (80 mg total) by mouth daily at 6 PM.  . betamethasone dipropionate 0.05 % cream Apply topically 2 (two) times daily.  . calcium-vitamin D (OSCAL WITH D) 500-200 MG-UNIT tablet Take 1 tablet by mouth.  Marland Kitchen glipiZIDE (GLIPIZIDE XL) 5 MG 24 hr tablet Take 1 tablet (5 mg total) by mouth daily with breakfast.  . glucose blood (ONE TOUCH  ULTRA TEST) test strip USE AS INSTRUCTED FOR TWICE DAILY TESTING  . Insulin Glargine (BASAGLAR KWIKPEN) 100 UNIT/ML Inject 0.5 mLs (50 Units total) into the skin at bedtime.  . Insulin Pen Needle (BD PEN NEEDLE MICRO U/F) 32G X 6 MM MISC USE TO INJECT INSULIN DAILY  . irbesartan (AVAPRO) 300 MG tablet TAKE 1 TABLET DAILY  . Lancets (ONETOUCH ULTRASOFT) lancets USE AS DIRECTED TWICE DAILY  . metoprolol tartrate (LOPRESSOR) 100 MG tablet Take 1 tablet (100 mg total) by mouth 2 (two) times daily.  . Multiple Vitamin (MULTIVITAMIN WITH MINERALS) TABS tablet Take 1 tablet by mouth daily.  . pantoprazole (PROTONIX) 40 MG tablet TAKE 1 TABLET BY MOUTH 30 MINUTES BEFORE FIRST MEAL  . polyethylene glycol (MIRALAX / GLYCOLAX) 17 g packet Take 17 g by mouth daily as needed.  Marland Kitchen spironolactone (ALDACTONE) 50 MG tablet Take 1 tablet (50 mg total) by mouth daily.  . vitamin B-12 (CYANOCOBALAMIN) 500 MCG tablet Take 500 mcg by mouth daily.  . [DISCONTINUED] Insulin Glargine (BASAGLAR KWIKPEN) 100 UNIT/ML Inject 0.6 mLs (60 Units total) into the skin at bedtime.   No facility-administered encounter medications on file as of 06/01/2020.    ALLERGIES: Allergies  Allergen Reactions  . Poison Oak Extract [Poison Oak Extract] Hives  . Sulfonamide Derivatives Itching    Burning sensation all over body    VACCINATION STATUS: Immunization History  Administered Date(s) Administered  . Fluad Quad(high Dose 65+) 07/27/2019  . Influenza Split 08/01/2011, 07/31/2012  . Influenza Whole 07/29/2007, 07/20/2008, 07/21/2009, 07/19/2010  . Influenza, High Dose Seasonal PF 07/23/2018  . Influenza,inj,Quad PF,6+ Mos 08/07/2013, 06/15/2014, 07/19/2015, 06/28/2016, 07/25/2017  . Moderna SARS-COVID-2 Vaccination 12/22/2019, 01/19/2020  . Pneumococcal Conjugate-13 10/12/2014  . Pneumococcal Polysaccharide-23 03/16/2004, 08/29/2012  . Td 03/16/2004  . Tdap 03/31/2012  . Zoster 01/02/2007    Diabetes She presents for  her follow-up diabetic visit. She has type 2 diabetes mellitus. Onset time: She was diagnosed at approximate age of 1 years. Her disease course has been improving. There are no hypoglycemic associated symptoms. Pertinent negatives for hypoglycemia include no confusion, headaches, pallor or seizures. Associated symptoms include fatigue, polydipsia and polyuria. Pertinent negatives for diabetes include no chest pain and no polyphagia. There are no hypoglycemic complications. Symptoms are worsening. Diabetic complications include nephropathy. Risk factors for coronary artery disease include diabetes mellitus, dyslipidemia, family history, hypertension, obesity, sedentary lifestyle and post-menopausal. Current diabetic treatment includes insulin injections (She is currently on Basaglar 50  units nightly, and glipizide 10 mg XL daily at breakfast.  ). Her weight is fluctuating minimally. She is following a generally unhealthy diet. When asked about meal planning, she reported none. She has had a previous visit with a dietitian. She participates in exercise intermittently. Her breakfast blood glucose range is generally 130-140 mg/dl. Her lunch blood glucose range is generally 140-180 mg/dl. Her dinner blood glucose range is generally 140-180 mg/dl. Her bedtime blood glucose range is generally 140-180 mg/dl. Her overall blood glucose range is 140-180 mg/dl. (She presents with improved glycemic profile, tighter fasting readings between 83-132, postprandial glycemic profile between 140-180.    Her A1c was 9.6%  May 23, 2020.) An ACE inhibitor/angiotensin II receptor blocker is being taken. She sees a podiatrist.Eye exam is current.  Hyperlipidemia This is a chronic problem. The current episode started more than 1 year ago. Exacerbating diseases include chronic renal disease, diabetes and obesity. Pertinent negatives include no chest pain, myalgias or shortness of breath. Current antihyperlipidemic treatment includes  statins. Risk factors for coronary artery disease include diabetes mellitus, dyslipidemia, obesity, hypertension, a sedentary lifestyle and post-menopausal.  Hypertension This is a chronic problem. The current episode started more than 1 year ago. Pertinent negatives include no chest pain, headaches, palpitations or shortness of breath. Risk factors for coronary artery disease include dyslipidemia, diabetes mellitus, sedentary lifestyle, obesity and post-menopausal state. Past treatments include angiotensin blockers. Identifiable causes of hypertension include chronic renal disease.     Review of Systems  Constitutional: Positive for fatigue. Negative for chills, fever and unexpected weight change.  HENT: Negative for trouble swallowing and voice change.   Eyes: Negative for visual disturbance.  Respiratory: Negative for cough, shortness of breath and wheezing.   Cardiovascular: Negative for chest pain, palpitations and leg swelling.  Gastrointestinal: Negative for diarrhea, nausea and vomiting.  Endocrine: Positive for polydipsia and polyuria. Negative for cold intolerance, heat intolerance and polyphagia.  Musculoskeletal: Negative for arthralgias and myalgias.  Skin: Negative for color change, pallor, rash and wound.  Neurological: Negative for seizures and headaches.  Psychiatric/Behavioral: Negative for confusion and suicidal ideas.    Objective:    Vitals with BMI 06/01/2020 05/24/2020 05/23/2020  Height 5\' 7"  5\' 7"  -  Weight 203 lbs 3 oz 205 lbs -  BMI 31.82 32.1 -  Systolic 144 138  Diastolic 64 84 70  Pulse 64 64 -    BP (!) 144/64   Pulse 64   Ht 5\' 7"  (1.702 m)   Wt 203 lb 3.2 oz (92.2 kg)   BMI 31.83 kg/m   Wt Readings from Last 3 Encounters:  06/01/20 203 lb 3.2 oz (92.2 kg)  05/24/20 205 lb (93 kg)  05/23/20 (!) 203 lb 1.9 oz (92.1 kg)     Physical Exam Constitutional:      Appearance: She is well-developed.  HENT:     Head: Normocephalic and atraumatic.   Neck:     Thyroid: No thyromegaly.     Trachea: No tracheal deviation.  Cardiovascular:     Rate and Rhythm: Normal rate and regular rhythm.  Pulmonary:     Effort: Pulmonary effort is normal.     Breath sounds: Normal breath sounds.  Abdominal:     Tenderness: There is no abdominal tenderness. There is no guarding.  Musculoskeletal:        General: Normal range of motion.     Cervical back: Normal range of motion and neck supple.  Skin:    General: Skin  is warm and dry.     Coloration: Skin is not pale.     Findings: No erythema or rash.  Neurological:     Mental Status: She is alert and oriented to person, place, and time.     Cranial Nerves: No cranial nerve deficit.     Coordination: Coordination normal.     Deep Tendon Reflexes: Reflexes are normal and symmetric.  Psychiatric:        Judgment: Judgment normal.     CMP     Component Value Date/Time   NA 143 05/23/2020 1117   K 4.6 05/23/2020 1117   CL 104 05/23/2020 1117   CO2 26 05/23/2020 1117   GLUCOSE 185 (H) 05/23/2020 1117   GLUCOSE 280 (H) 02/25/2020 1312   BUN 14 05/23/2020 1117   CREATININE 1.09 (H) 05/23/2020 1117   CREATININE 1.14 (H) 12/15/2019 1048   CALCIUM 9.8 05/23/2020 1117   PROT 6.8 02/25/2020 1312   ALBUMIN 3.9 02/25/2020 1312   AST 21 02/25/2020 1312   ALT 31 02/25/2020 1312   ALKPHOS 89 02/25/2020 1312   BILITOT 0.7 02/25/2020 1312   GFRNONAA 48 (L) 05/23/2020 1117   GFRNONAA 45 (L) 12/15/2019 1048   GFRAA 55 (L) 05/23/2020 1117   GFRAA 53 (L) 12/15/2019 1048     Diabetic Labs (most recent): Lab Results  Component Value Date   HGBA1C 9.6 (A) 05/23/2020   HGBA1C 9.6 05/23/2020   HGBA1C 9.6 (A) 05/23/2020   HGBA1C 9.6 (A) 05/23/2020     Lipid Panel ( most recent) Lipid Panel     Component Value Date/Time   CHOL 147 12/15/2019 1048   TRIG 127 12/15/2019 1048   HDL 45 (L) 12/15/2019 1048   CHOLHDL 3.3 12/15/2019 1048   VLDL 21 03/22/2017 0803   LDLCALC 80 12/15/2019 1048       Lab Results  Component Value Date   TSH 4.360 05/23/2020   TSH 6.85 (H) 07/24/2019   TSH 5.19 (H) 08/04/2018   TSH 3.81 04/10/2016   TSH 2.410 12/13/2015   TSH 5.289 (H) 11/14/2015   TSH 3.963 06/14/2014   TSH 3.427 06/03/2013   TSH 3.333 01/22/2012   TSH 3.737 05/15/2010   FREET4 1.0 12/15/2019   FREET4 0.8 07/24/2019   FREET4 0.9 08/04/2018   FREET4 0.78 12/13/2015   FREET4 0.84 11/14/2015       Assessment & Plan:   1. Type 2 diabetes mellitus with stage 2 chronic kidney disease, with long-term current use of insulin (HCC)   - HELLON VACCARELLA has currently uncontrolled symptomatic type 2 DM since  79 years of age. Recent labs reviewed.  She presents with improved glycemic profile, tighter fasting readings between 83-132, postprandial glycemic profile between 140-180.    Her A1c was 9.6%  May 23, 2020. - I had a long discussion with her about the progressive nature of diabetes and the pathology behind its complications. -her diabetes is complicated by stage 2 CKD, obesity /sedentary life, and she remains at a high risk for more acute and chronic complications which include CAD, CVA, CKD, retinopathy, and neuropathy. These are all discussed in detail with her.  - I have counseled her on diet  and weight management  by adopting a carbohydrate restricted/protein rich diet. Patient is encouraged to switch to  unprocessed or minimally processed     complex starch and increased protein intake (animal or plant source), fruits, and vegetables. -  she is advised to stick to a routine  mealtimes to eat 3 meals  a day and avoid unnecessary snacks ( to snack only to correct hypoglycemia).   - she  admits there is a room for improvement in her diet and drink choices. -  Suggestion is made for her to avoid simple carbohydrates  from her diet including Cakes, Sweet Desserts / Pastries, Ice Cream, Soda (diet and regular), Sweet Tea, Candies, Chips, Cookies, Sweet Pastries,  Store  Bought Juices, Alcohol in Excess of  1-2 drinks a day, Artificial Sweeteners, Coffee Creamer, and "Sugar-free" Products. This will help patient to have stable blood glucose profile and potentially avoid unintended weight gain.   - she will be scheduled with Norm Salt, RDN, CDE for diabetes education.  - I have approached her with the following individualized plan to manage  her diabetes and patient agrees:   - she will continue to need at least basal insulin in order for her to achieve control of diabetes to target.   -Accordingly, she is advised to lower her Basaglar to 50 units nightly,  associated with strict monitoring of glucose 2 times a day-before breakfast and at bedtime. - she is warned not to take insulin without proper monitoring per orders. - Adjustment parameters are given to her for hypo and hyperglycemia in writing. - she is encouraged to call clinic for blood glucose levels less than 70 or above 300 mg /dl. - she is advised to continue glipizide  5 mg XL p.o. daily at breakfast, patient with history of sulfa allergy.   -She will be considered for low-dose Metformin or incretin therapy during her next visit.   - Specific targets for  A1c;  LDL, HDL,  and Triglycerides were discussed with the patient.  2) Blood Pressure /Hypertension: Her blood pressure is not controlled to target.  she is advised to continue her current medications including irbesartan 300 mg p.o. daily with breakfast . 3) Lipids/Hyperlipidemia:   Review of her recent lipid panel showed  controlled  LDL at 80 .  she  is advised to continue atorvastatin 80 mg daily at bedtime.  Side effects and precautions discussed with her.  4)  Weight/Diet:  Body mass index is 31.83 kg/m.  -   clearly complicating her diabetes care.   she is  a candidate for weight loss. I discussed with her the fact that loss of 5 - 10% of her  current body weight will have the most impact on her diabetes management.  Exercise, and  detailed carbohydrates information provided  -  detailed on discharge instructions.  5) Chronic Care/Health Maintenance:  -she  is on ACEI/ARB and Statin medications and  is encouraged to initiate and continue to follow up with Ophthalmology, Dentist,  Podiatrist at least yearly or according to recommendations, and advised to   stay away from smoking. I have recommended yearly flu vaccine and pneumonia vaccine at least every 5 years; moderate intensity exercise for up to 150 minutes weekly; and  sleep for at least 7 hours a day.  - she is  advised to maintain close follow up with [name], MD for primary care needs, as well as her other providers for optimal and coordinated care.   - Time spent on this patient care encounter:  35 min, of which > 50% was spent in  counseling and the rest reviewing her blood glucose logs , discussing her hypoglycemia and hyperglycemia episodes, reviewing her current and  previous labs / studies  ( including abstraction from other facilities)  and medications  doses and developing a  long term treatment plan and documenting her care.   Please refer to Patient Instructions for Blood Glucose Monitoring and Insulin/Medications Dosing Guide"  in media tab for additional information. Please  also refer to " Patient Self Inventory" in the Media  tab for reviewed elements of pertinent patient history.  Kimberly Kaufmann participated in the discussions, expressed understanding, and voiced agreement with the above plans.  All questions were answered to her satisfaction. she is encouraged to contact clinic should she have any questions or concerns prior to her return visit.   Follow up plan: - Return in about 3 months (around 09/01/2020) for Bring Meter and Logs- A1c in Office, [place] with Whitney.  Marquis Lunch, MD [facility] [address] [place], [place] [address] Phone: [phone]  Fax: [phone]     06/01/2020, 9:15 AM  This note was partially dictated with voice recognition software. Similar sounding words can be transcribed inadequately or may not  be corrected upon review.

## 2020-06-01 NOTE — Patient Instructions (Signed)

## 2020-06-23 ENCOUNTER — Ambulatory Visit (INDEPENDENT_AMBULATORY_CARE_PROVIDER_SITE_OTHER): Payer: Medicare Other | Admitting: *Deleted

## 2020-06-23 ENCOUNTER — Other Ambulatory Visit: Payer: Self-pay

## 2020-06-23 DIAGNOSIS — Z23 Encounter for immunization: Secondary | ICD-10-CM

## 2020-06-24 ENCOUNTER — Ambulatory Visit: Payer: Medicare Other

## 2020-06-28 ENCOUNTER — Telehealth: Payer: Self-pay

## 2020-06-28 NOTE — Telephone Encounter (Signed)
States [facility] called her to schedule appt from [place] and she wanted to know if there was one that was local. They have a hard time going to [facility].

## 2020-06-30 ENCOUNTER — Other Ambulatory Visit: Payer: Self-pay

## 2020-06-30 DIAGNOSIS — I739 Peripheral vascular disease, unspecified: Secondary | ICD-10-CM

## 2020-06-30 NOTE — Telephone Encounter (Signed)
Called pt to advise that the [place] office is the one that schedules the appt for Kimberly Freeman

## 2020-07-04 ENCOUNTER — Other Ambulatory Visit: Payer: Self-pay

## 2020-07-04 ENCOUNTER — Ambulatory Visit (HOSPITAL_COMMUNITY)
Admission: RE | Admit: 2020-07-04 | Discharge: 2020-07-04 | Disposition: A | Discharge status: Home / Self Care | Payer: Medicare Other | Source: Ambulatory Visit | Attending: Vascular Surgery | Admitting: Vascular Surgery

## 2020-07-04 DIAGNOSIS — R600 Localized edema: Secondary | ICD-10-CM | POA: Diagnosis not present

## 2020-07-04 DIAGNOSIS — I739 Peripheral vascular disease, unspecified: Secondary | ICD-10-CM | POA: Insufficient documentation

## 2020-07-07 ENCOUNTER — Ambulatory Visit: Payer: Medicare Other | Admitting: Nutrition

## 2020-07-11 ENCOUNTER — Other Ambulatory Visit: Payer: Self-pay

## 2020-07-11 ENCOUNTER — Encounter: Payer: Self-pay | Admitting: Vascular Surgery

## 2020-07-11 ENCOUNTER — Ambulatory Visit (INDEPENDENT_AMBULATORY_CARE_PROVIDER_SITE_OTHER): Payer: Medicare Other | Admitting: Vascular Surgery

## 2020-07-11 VITALS — BP 167/76 | HR 72 | Temp 97.2°F | Resp 16 | Ht 67.0 in | Wt 205.0 lb

## 2020-07-11 DIAGNOSIS — M7989 Other specified soft tissue disorders: Secondary | ICD-10-CM

## 2020-07-11 NOTE — Progress Notes (Signed)
[facility] [place]  Patient name: Kimberly Freeman MRN: [id number] DOB: Oct 31, 1940 Sex: female  REASON FOR CONSULT: Evaluation lower extremity arterial and venous circulation  HPI: Kimberly Freeman is a 79 y.o. female, who is here today for evaluation and discussion of recent noninvasive studies.  She is very active 79 year old.  Her main complaint to me is of leg swelling.  She reports that her legs are relatively flat in the morning and by the end of the day has swelling from her knees down onto her ankles.  She has no history of venous ulcer.  No history of DVT.  Does have history of coronary artery disease which is treated medically.  She is diabetic.  She has tried compression garments in the past and has had difficult time tolerating the.  She has no claudication type symptoms and no lower extremity tissue loss  Past Medical History:  Diagnosis Date  . Arthritis   . CAD (coronary artery disease) 08/2019   60% RI with other 20-30% lesions at cath  . Diabetes mellitus, type 2 (HCC)   . Essential hypertension   . GERD (gastroesophageal reflux disease)   . Hyperlipidemia     Family History  Problem Relation Age of Onset  . Leukemia Mother   . Cancer Mother   . Diabetes Mother   . Prostate cancer Father   . Lung cancer Sister 46  . Diabetes Sister   . Colon cancer Neg Hx     SOCIAL HISTORY: Social History   Socioeconomic History  . Marital status: Married    Spouse name: [name]   . Number of children: 0  . Years of education: 12+  . Highest education level: 12th grade  Occupational History  . Occupation: retired   Tobacco Use  . Smoking status: Never Smoker  . Smokeless tobacco: Never Used  Vaping Use  . Vaping Use: Never used  Substance and Sexual Activity  . Alcohol use: No    Alcohol/week: 0.0 standard drinks  . Drug use: No  . Sexual activity: Not Currently    Partners: Male  Other Topics Concern  .  Not on file  Social History Narrative   Lives alone with husband    Social Determinants of Health   Financial Resource Strain:   . Difficulty of Paying Living Expenses: Not on file  Food Insecurity:   . Worried About Programme researcher, broadcasting/film/video in the Last Year: Not on file  . Ran Out of Food in the Last Year: Not on file  Transportation Needs:   . Lack of Transportation (Medical): Not on file  . Lack of Transportation (Non-Medical): Not on file  Physical Activity:   . Days of Exercise per Week: Not on file  . Minutes of Exercise per Session: Not on file  Stress:   . Feeling of Stress : Not on file  Social Connections:   . Frequency of Communication with Friends and Family: Not on file  . Frequency of Social Gatherings with Friends and Family: Not on file  . Attends Religious Services: Not on file  . Active Member of Clubs or Organizations: Not on file  . Attends Banker Meetings: Not on file  . Marital Status: Not on file  Intimate Partner Violence:   . Fear of Current or Ex-Partner: Not on file  . Emotionally Abused: Not on file  . Physically Abused: Not on file  . Sexually Abused: Not on file  Allergies  Allergen Reactions  . Poison Oak Extract [Poison Oak Extract] Hives  . Sulfonamide Derivatives Itching    Burning sensation all over body    Current Outpatient Medications  Medication Sig Dispense Refill  . acetaminophen (TYLENOL) 500 MG tablet Take 500 mg by mouth daily as needed for mild pain or moderate pain. For pain     . amLODipine (NORVASC) 2.5 MG tablet TAKE 1 TABLET DAILY (Patient taking differently: Take 5 mg by mouth daily. ) 90 tablet 3  . aspirin 81 MG chewable tablet Chew 1 tablet (81 mg total) by mouth daily. 30 tablet 0  . atorvastatin (LIPITOR) 80 MG tablet Take 1 tablet (80 mg total) by mouth daily at 6 PM. 90 tablet 1  . betamethasone dipropionate 0.05 % cream Apply topically 2 (two) times daily. 30 g 0  . calcium-vitamin D (OSCAL WITH D)  500-200 MG-UNIT tablet Take 1 tablet by mouth.    Marland Kitchen glipiZIDE (GLIPIZIDE XL) 5 MG 24 hr tablet Take 1 tablet (5 mg total) by mouth daily with breakfast. 90 tablet 1  . glucose blood (ONE TOUCH ULTRA TEST) test strip USE AS INSTRUCTED FOR TWICE DAILY TESTING 200 each 3  . Insulin Glargine (BASAGLAR KWIKPEN) 100 UNIT/ML Inject 0.5 mLs (50 Units total) into the skin at bedtime. 15 mL 2  . Insulin Pen Needle (BD PEN NEEDLE MICRO U/F) 32G X 6 MM MISC USE TO INJECT INSULIN DAILY 100 each 3  . irbesartan (AVAPRO) 300 MG tablet TAKE 1 TABLET DAILY 90 tablet 3  . Lancets (ONETOUCH ULTRASOFT) lancets USE AS DIRECTED TWICE DAILY 200 each 3  . metoprolol tartrate (LOPRESSOR) 100 MG tablet Take 1 tablet (100 mg total) by mouth 2 (two) times daily. 180 tablet 3  . Multiple Vitamin (MULTIVITAMIN WITH MINERALS) TABS tablet Take 1 tablet by mouth daily.    . pantoprazole (PROTONIX) 40 MG tablet TAKE 1 TABLET BY MOUTH 30 MINUTES BEFORE FIRST MEAL 90 tablet 3  . polyethylene glycol (MIRALAX / GLYCOLAX) 17 g packet Take 17 g by mouth daily as needed.    Marland Kitchen spironolactone (ALDACTONE) 50 MG tablet Take 1 tablet (50 mg total) by mouth daily. 30 tablet 2  . vitamin B-12 (CYANOCOBALAMIN) 500 MCG tablet Take 500 mcg by mouth daily.     No current facility-administered medications for this visit.    REVIEW OF SYSTEMS:  [X]  denotes positive finding, [ ]  denotes negative finding Cardiac  Comments:  Chest pain or chest pressure:    Shortness of breath upon exertion:    Short of breath when lying flat:    Irregular heart rhythm:        Vascular    Pain in calf, thigh, or hip brought on by ambulation:    Pain in feet at night that wakes you up from your sleep:     Blood clot in your veins:    Leg swelling:  x       Pulmonary    Oxygen at home:    Productive cough:     Wheezing:         Neurologic    Sudden weakness in arms or legs:     Sudden numbness in arms or legs:     Sudden onset of difficulty speaking or  slurred speech:    Temporary loss of vision in one eye:     Problems with dizziness:         Gastrointestinal    Blood in stool:  Vomited blood:         Genitourinary    Burning when urinating:     Blood in urine:        Psychiatric    Major depression:         Hematologic    Bleeding problems:    Problems with blood clotting too easily:        Skin    Rashes or ulcers: x       Constitutional    Fever or chills:      PHYSICAL EXAM: There were no vitals filed for this visit.  GENERAL: The patient is a well-nourished female, in no acute distress. The vital signs are documented above. CARDIOVASCULAR: Carotid arteries are without bruits bilaterally.  2+ radial, 2+ femoral and 2+ dorsalis pedis pulses bilaterally.  She does have pitting edema both lower extremities and some thickening and slight hemosiderin deposit medial aspects above her medial malleolus bilaterally PULMONARY: There is good air exchange  ABDOMEN: Soft and non-tender  MUSCULOSKELETAL: There are no major deformities or cyanosis. NEUROLOGIC: No focal weakness or paresthesias are detected. SKIN: There are no ulcers or rashes noted. PSYCHIATRIC: The patient has a normal affect.  DATA:  Outpatient noninvasive studies were reviewed revealing eczema necks of greater than 1 on the right and 0.94 on the left  MEDICAL ISSUES: I discussed these findings with the patient.  She does not have any evidence of significant arterial insufficiency.  I did explain treatment for her swelling.  She does not have any evidence of varicosity or other physical exam which suggest saphenous vein as the cause of this.  I suspect that she probably does have deep venous reflux.  She is able to tolerate this.  I did encourage her to attempt wearing compression especially if she is going to be up on her feet a great deal.  She was reassured with this and will see [place] again on an as-needed basis   Larina Earthly, MD FACS [facility] Tel [phone] Pager [phone]

## 2020-07-12 ENCOUNTER — Encounter: Payer: Self-pay | Admitting: Family Medicine

## 2020-07-12 ENCOUNTER — Other Ambulatory Visit (HOSPITAL_COMMUNITY): Payer: Self-pay | Admitting: Family Medicine

## 2020-07-12 ENCOUNTER — Ambulatory Visit (INDEPENDENT_AMBULATORY_CARE_PROVIDER_SITE_OTHER): Payer: Medicare Other | Admitting: Family Medicine

## 2020-07-12 VITALS — BP 159/64 | HR 69 | Resp 15 | Ht 67.0 in | Wt 204.0 lb

## 2020-07-12 DIAGNOSIS — L309 Dermatitis, unspecified: Secondary | ICD-10-CM | POA: Diagnosis not present

## 2020-07-12 DIAGNOSIS — E1122 Type 2 diabetes mellitus with diabetic chronic kidney disease: Secondary | ICD-10-CM

## 2020-07-12 DIAGNOSIS — Z794 Long term (current) use of insulin: Secondary | ICD-10-CM | POA: Diagnosis not present

## 2020-07-12 DIAGNOSIS — E6609 Other obesity due to excess calories: Secondary | ICD-10-CM | POA: Diagnosis not present

## 2020-07-12 DIAGNOSIS — E782 Mixed hyperlipidemia: Secondary | ICD-10-CM

## 2020-07-12 DIAGNOSIS — I10 Essential (primary) hypertension: Secondary | ICD-10-CM | POA: Diagnosis not present

## 2020-07-12 DIAGNOSIS — Z1231 Encounter for screening mammogram for malignant neoplasm of breast: Secondary | ICD-10-CM

## 2020-07-12 DIAGNOSIS — N182 Chronic kidney disease, stage 2 (mild): Secondary | ICD-10-CM

## 2020-07-12 DIAGNOSIS — Z6831 Body mass index (BMI) 31.0-31.9, adult: Secondary | ICD-10-CM

## 2020-07-12 MED ORDER — AMLODIPINE BESYLATE 5 MG PO TABS: 5.0000 mg | ORAL_TABLET | Freq: Every day | ORAL | 1 refills | Status: DC

## 2020-07-12 MED ORDER — BETAMETHASONE DIPROPIONATE 0.05 % EX CREA: TOPICAL_CREAM | Freq: Two times a day (BID) | CUTANEOUS | 1 refills | Status: DC

## 2020-07-12 NOTE — Patient Instructions (Addendum)
F/U in office with MD in 8 to 1 0 weeks, re evaluate blood pressure, call if you need me sooner  Blood pressure is still high. INCREASE diose of amlodipine to 5 mg once daily, you may take TWO amlodipine 2.5 mg tablets together once daily until you get the 5 mg tablets from mail order  Please get fasting lipid, cmp and EGFr and microalb 5 days before next visit  Steroid cream is prescribed for the exzematous rash on yur ankles  It is important that you exercise regularly at least 30 minutes 5 times a week. If you develop chest pain, have severe difficulty breathing, or feel very tired, stop exercising immediately and seek medical attention   Think about what you will eat, plan ahead. Choose " clean, green, fresh or frozen" over canned, processed or packaged foods which are more sugary, salty and fatty. 70 to 75% of food eaten should be vegetables and fruit. Three meals at set times with snacks allowed between meals, but they must be fruit or vegetables. Aim to eat over a 12 hour period , example 7 am to 7 pm, and STOP after  your last meal of the day. Drink water,generally about 64 ounces per day, no other drink is as healthy. Fruit juice is best enjoyed in a healthy way, by EATING the fruit. Thanks for choosing [facility], we consider it a privelige to serve you.

## 2020-07-16 ENCOUNTER — Encounter: Payer: Self-pay | Admitting: Family Medicine

## 2020-07-16 DIAGNOSIS — L309 Dermatitis, unspecified: Secondary | ICD-10-CM | POA: Insufficient documentation

## 2020-07-16 NOTE — Assessment & Plan Note (Signed)
Hyperlipidemia:Low fat diet discussed and encouraged.   Lipid Panel  Lab Results  Component Value Date   CHOL 147 12/15/2019   HDL 45 (L) 12/15/2019   LDLCALC 80 12/15/2019   TRIG 127 12/15/2019   CHOLHDL 3.3 12/15/2019  Updated lab needed at/ before next visit.

## 2020-07-16 NOTE — Assessment & Plan Note (Signed)
Uncontrolled , incrase dose of amlodipine DASH diet and commitment to daily physical activity for a minimum of 30 minutes discussed and encouraged, as a part of hypertension management. The importance of attaining a healthy weight is also discussed.  BP/Weight 07/12/2020 07/11/2020 06/01/2020 05/24/2020 05/23/2020 03/31/2020 02/26/2020  Systolic BP 159 167 144 138 160 132 157  Diastolic BP 64 76 64 84 70 74 55  Wt. (Lbs) 204 205 203.2 205 203.12 203.12 201  BMI 31.95 32.11 31.83 32.11 31.81 31.81 31.48

## 2020-07-16 NOTE — Assessment & Plan Note (Signed)
Uncontrolled but improving per pt report, now managed by Endo Ms. Baldonado is reminded of the importance of commitment to daily physical activity for 30 minutes or more, as able and the need to limit carbohydrate intake to 30 to 60 grams per meal to help with blood sugar control.   The need to take medication as prescribed, test blood sugar as directed, and to call between visits if there is a concern that blood sugar is uncontrolled is also discussed.   Ms. Fuentes is reminded of the importance of daily foot exam, annual eye examination, and good blood sugar, blood pressure and cholesterol control.  Diabetic Labs Latest Ref Rng & Units 05/23/2020 05/23/2020 05/23/2020 05/23/2020 02/25/2020  HbA1c 4.0 - 5.6 % 9.6(A) 9.6(A) 9.6 9.6(A) -  Microalbumin mg/dL - - - - -  Micro/Creat Ratio <30 mcg/mg creat - - - - -  Chol <200 mg/dL - - - - -  HDL > OR = 50 mg/dL - - - - -  Calc LDL mg/dL (calc) - - - - -  Triglycerides <150 mg/dL - - - - -  Creatinine 2.77 - 1.00 mg/dL 8.24(M) - - - 3.53(I)   BP/Weight 07/12/2020 07/11/2020 06/01/2020 05/24/2020 05/23/2020 03/31/2020 02/26/2020  Systolic BP 159 167 144 138 160 132 157  Diastolic BP 64 76 64 84 70 74 55  Wt. (Lbs) 204 205 203.2 205 203.12 203.12 201  BMI 31.95 32.11 31.83 32.11 31.81 31.81 31.48   Foot/eye exam completion dates Latest Ref Rng & Units 03/01/2020 12/15/2019  Eye Exam No Retinopathy No Retinopathy -  Foot exam Order - - -  Foot Form Completion - - Done

## 2020-07-16 NOTE — Assessment & Plan Note (Signed)
  Patient re-educated about  the importance of commitment to a  minimum of 150 minutes of exercise per week as able.  The importance of healthy food choices with portion control discussed, as well as eating regularly and within a 12 hour window most days. The need to choose "clean , green" food 50 to 75% of the time is discussed, as well as to make water the primary drink and set a goal of 64 ounces water daily.    Weight /BMI 07/12/2020 07/11/2020 06/01/2020  WEIGHT 204 lb 205 lb 203 lb 3.2 oz  HEIGHT 5\' 7"  5\' 7"  5\' 7"   BMI 31.95 kg/m2 32.11 kg/m2 31.83 kg/m2

## 2020-07-16 NOTE — Assessment & Plan Note (Signed)
Topical steroid twice daily for 7 to 10 days, then as needed

## 2020-07-16 NOTE — Progress Notes (Signed)
Kimberly Freeman     MRN: [id number]      DOB: [birth year]   HPI Kimberly Freeman is here for follow up and re-evaluation of chronic medical conditions, medication management and review of any available recent lab and radiology data.  Preventive health is updated, specifically  Cancer screening and Immunization.   Questions or concerns regarding consultations or procedures which the PT has had in the interim are  Addressed.blood sugar greatly improved and in goal range now Endo is managing her diabetes Had evaluation by vascular nd has no compromise in circulation The PT denies any adverse reactions to current medications since the last visit.  Denies polyuria, polydipsia, blurred vision , or hypoglycemic episodes.    ROS Denies recent fever or chills. Denies sinus pressure, nasal congestion, ear pain or sore throat. Denies chest congestion, productive cough or wheezing. Denies chest pains, palpitations and leg swelling Denies abdominal pain, nausea, vomiting,diarrhea or constipation.   Denies dysuria, frequency, hesitancy or incontinence. Denies uncontrolled  joint pain, swelling and limitation in mobility. Denies headaches, seizures, numbness, or tingling. Denies depression, anxiety or insomnia. C/o itchy  Rash on ankles   PE  BP (!) 159/64   Pulse 69   Resp 15   Ht 5\' 7"  (1.702 m)   Wt 204 lb (92.5 kg)   SpO2 94%   BMI 31.95 kg/m   Patient alert and oriented and in no cardiopulmonary distress.  HEENT: No facial asymmetry, EOMI,     Neck supple .  Chest: Clear to auscultation bilaterally.  CVS: S1, S2 no murmurs, no S3.Regular rate.  ABD: Soft non tender.   Ext: No edema  MS: Adequate though reduced ROM spine, shoulders, hips and knees.  Skin: Intact, eczematous  rash noted.on ankles  Psych: Good eye contact, normal affect. Memory intact not anxious or depressed appearing.  CNS: CN 2-12 intact, power,  normal throughout.no focal deficits noted.   Assessment &  Plan  Malignant hypertension Uncontrolled , incrase dose of amlodipine DASH diet and commitment to daily physical activity for a minimum of 30 minutes discussed and encouraged, as a part of hypertension management. The importance of attaining a healthy weight is also discussed.  BP/Weight 07/12/2020 07/11/2020 06/01/2020 05/24/2020 05/23/2020 03/31/2020 02/26/2020  Systolic BP 159 167 144 138 160 132 157  Diastolic BP 64 76 64 84 70 74 55  Wt. (Lbs) 204 205 203.2 205 203.12 203.12 201  BMI 31.95 32.11 31.83 32.11 31.81 31.81 31.48       Type 2 diabetes mellitus with stage 2 chronic kidney disease, with long-term current use of insulin (HCC) Uncontrolled but improving per pt report, now managed by Endo Kimberly Freeman is reminded of the importance of commitment to daily physical activity for 30 minutes or more, as able and the need to limit carbohydrate intake to 30 to 60 grams per meal to help with blood sugar control.   The need to take medication as prescribed, test blood sugar as directed, and to call between visits if there is a concern that blood sugar is uncontrolled is also discussed.   Kimberly Freeman is reminded of the importance of daily foot exam, annual eye examination, and good blood sugar, blood pressure and cholesterol control.  Diabetic Labs Latest Ref Rng & Units 05/23/2020 05/23/2020 05/23/2020 05/23/2020 02/25/2020  HbA1c 4.0 - 5.6 % 9.6(A) 9.6(A) 9.6 9.6(A) -  Microalbumin mg/dL - - - - -  Micro/Creat Ratio <30 mcg/mg creat - - - - -  Chol <  200 mg/dL - - - - -  HDL > OR = 50 mg/dL - - - - -  Calc LDL mg/dL (calc) - - - - -  Triglycerides <150 mg/dL - - - - -  Creatinine 1.69 - 1.00 mg/dL 6.78(L) - - - 3.81(O)   BP/Weight 07/12/2020 07/11/2020 06/01/2020 05/24/2020 05/23/2020 03/31/2020 02/26/2020  Systolic BP 159 167 144 138 160 132 157  Diastolic BP 64 76 64 84 70 74 55  Wt. (Lbs) 204 205 203.2 205 203.12 203.12 201  BMI 31.95 32.11 31.83 32.11 31.81 31.81 31.48   Foot/eye exam completion  dates Latest Ref Rng & Units 03/01/2020 12/15/2019  Eye Exam No Retinopathy No Retinopathy -  Foot exam Order - - -  Foot Form Completion - - Done        Obese  Patient re-educated about  the importance of commitment to a  minimum of 150 minutes of exercise per week as able.  The importance of healthy food choices with portion control discussed, as well as eating regularly and within a 12 hour window most days. The need to choose "clean , green" food 50 to 75% of the time is discussed, as well as to make water the primary drink and set a goal of 64 ounces water daily.    Weight /BMI 07/12/2020 07/11/2020 06/01/2020  WEIGHT 204 lb 205 lb 203 lb 3.2 oz  HEIGHT 5\' 7"  5\' 7"  5\' 7"   BMI 31.95 kg/m2 32.11 kg/m2 31.83 kg/m2      Eczema Topical steroid twice daily for 7 to 10 days, then as needed  Mixed hyperlipidemia Hyperlipidemia:Low fat diet discussed and encouraged.   Lipid Panel  Lab Results  Component Value Date   CHOL 147 12/15/2019   HDL 45 (L) 12/15/2019   LDLCALC 80 12/15/2019   TRIG 127 12/15/2019   CHOLHDL 3.3 12/15/2019  Updated lab needed at/ before next visit.

## 2020-07-17 ENCOUNTER — Other Ambulatory Visit: Payer: Self-pay | Admitting: Gastroenterology

## 2020-07-19 DIAGNOSIS — B351 Tinea unguium: Secondary | ICD-10-CM | POA: Diagnosis not present

## 2020-07-19 DIAGNOSIS — E1142 Type 2 diabetes mellitus with diabetic polyneuropathy: Secondary | ICD-10-CM | POA: Diagnosis not present

## 2020-07-26 ENCOUNTER — Encounter: Payer: Medicare Other | Attending: "Endocrinology | Admitting: Nutrition

## 2020-07-26 ENCOUNTER — Encounter: Payer: Self-pay | Admitting: Nutrition

## 2020-07-26 ENCOUNTER — Other Ambulatory Visit: Payer: Self-pay

## 2020-07-26 VITALS — Ht 67.0 in | Wt 204.0 lb

## 2020-07-26 DIAGNOSIS — Z794 Long term (current) use of insulin: Secondary | ICD-10-CM | POA: Diagnosis not present

## 2020-07-26 DIAGNOSIS — E782 Mixed hyperlipidemia: Secondary | ICD-10-CM | POA: Diagnosis not present

## 2020-07-26 DIAGNOSIS — E1122 Type 2 diabetes mellitus with diabetic chronic kidney disease: Secondary | ICD-10-CM | POA: Diagnosis not present

## 2020-07-26 DIAGNOSIS — N182 Chronic kidney disease, stage 2 (mild): Secondary | ICD-10-CM | POA: Insufficient documentation

## 2020-07-26 DIAGNOSIS — I739 Peripheral vascular disease, unspecified: Secondary | ICD-10-CM | POA: Diagnosis not present

## 2020-07-26 NOTE — Progress Notes (Signed)
  Medical Nutrition Therapy:  Appt start time: 1030 end time:  1130.  Assessment:  Primary concerns today: Diabetes Type 2, Obesity. Lives with her husband who has beginning stages of dementia and CHF. Eats 3 meals per day. Just started seeing Dr. Fransico Him, Endocrinology. PCP Dr. Lodema Hong. Hasn't been able to focus on eating right or eating meals on time due to caring for her husband. Doesn't have much outside help. Basaglar 50 units at night. Glipizide 5 mg a day,  Changed recently:  Eating smaller portions and cutting out sweets and junk food. Likes a piece of dark chocolate in the evenging or gram crackers. Has an exericse bike that she uses at times. Recently she cut out eating second helpings. FBS: 109-129 mg/dl  Bedtimes:  361-443'X Motivated to work on improving her blood sugars.   Lab Results  Component Value Date   HGBA1C 9.6 (A) 05/23/2020   HGBA1C 9.6 05/23/2020   HGBA1C 9.6 (A) 05/23/2020   HGBA1C 9.6 (A) 05/23/2020     Preferred Learning Style:   No preference indicated   Learning Readiness:   Ready  Change in progress   MEDICATIONS:    DIETARY INTAKE:.    24-hr recall:  B (  9AM): Cherrios Medley-oat flakes, granola and honey nut cherrios 2 cup, with milk, lactose milk, unsweet tea.  Snk ( AM):   L ( 1 PM): Tuna salad with ritz crackers, 6, water Snk ( PM): necot nabs, unsweet tea with equal D ( 6/7 PM): string bean, rib, white potatoes, Unsweet tea Snk ( PM):  Beverages: water and unsweet tea  Usual physical activity: Has exercise bike  Estimated energy needs: 1200  calories 135 g carbohydrates 80 g protein 35 g fat  Progress Towards Goal(s):  In progress.   Nutritional Diagnosis:  NB-1.1 Food and nutrition-related knowledge deficit As related to Diabetes Type 2.  As evidenced by A1C 9.6%.    Intervention:  Nutrition and Diabetes education provided on My Plate, CHO counting, meal planning, portion sizes, timing of meals, avoiding snacks between  meals unless having a low blood sugar, target ranges for A1C and blood sugars, signs/symptoms and treatment of hyper/hypoglycemia, monitoring blood sugars, taking medications as prescribed, benefits of exercising 30 minutes per day and prevention of complications of DM.  Goals  Follow My Plate Eat 2-3 carb choices per meal Avoid snacks Cut out sweets and desserts Drink only water Get FBS less than 130's in am and less than 150 at night.    Teaching Method Utilized:  Visual Auditory Hands on  Handouts given during visit include:  The Plate Method   Meal Plan Card    Barriers to learning/adherence to lifestyle change: none  Demonstrated degree of understanding via:  Teach Back   Monitoring/Evaluation:  Dietary intake, exercise, , and body weight in 1 month(s).

## 2020-08-04 ENCOUNTER — Encounter: Payer: Self-pay | Admitting: Nutrition

## 2020-08-04 NOTE — Patient Instructions (Signed)
Goals  Follow My Plate Eat 2-3 carb choices per meal Avoid snacks Cut out sweets and desserts Drink only water Get FBS less than 130's in am and less than 150 at night.

## 2020-08-22 ENCOUNTER — Other Ambulatory Visit: Payer: Self-pay

## 2020-08-22 ENCOUNTER — Ambulatory Visit (HOSPITAL_COMMUNITY)
Admission: RE | Admit: 2020-08-22 | Discharge: 2020-08-22 | Disposition: A | Discharge status: Home / Self Care | Payer: Medicare Other | Source: Ambulatory Visit | Attending: Family Medicine | Admitting: Family Medicine

## 2020-08-22 DIAGNOSIS — Z1231 Encounter for screening mammogram for malignant neoplasm of breast: Secondary | ICD-10-CM | POA: Insufficient documentation

## 2020-08-25 ENCOUNTER — Other Ambulatory Visit (HOSPITAL_COMMUNITY): Payer: Self-pay

## 2020-08-25 DIAGNOSIS — D649 Anemia, unspecified: Secondary | ICD-10-CM

## 2020-08-25 DIAGNOSIS — D500 Iron deficiency anemia secondary to blood loss (chronic): Secondary | ICD-10-CM

## 2020-08-26 ENCOUNTER — Inpatient Hospital Stay (HOSPITAL_COMMUNITY): Payer: Medicare Other | Attending: Hematology

## 2020-08-26 ENCOUNTER — Other Ambulatory Visit: Payer: Self-pay

## 2020-08-26 DIAGNOSIS — N189 Chronic kidney disease, unspecified: Secondary | ICD-10-CM | POA: Insufficient documentation

## 2020-08-26 DIAGNOSIS — R7889 Finding of other specified substances, not normally found in blood: Secondary | ICD-10-CM | POA: Insufficient documentation

## 2020-08-26 DIAGNOSIS — D649 Anemia, unspecified: Secondary | ICD-10-CM | POA: Diagnosis not present

## 2020-08-26 DIAGNOSIS — Z79899 Other long term (current) drug therapy: Secondary | ICD-10-CM | POA: Diagnosis not present

## 2020-08-26 DIAGNOSIS — D500 Iron deficiency anemia secondary to blood loss (chronic): Secondary | ICD-10-CM

## 2020-08-26 LAB — COMPREHENSIVE METABOLIC PANEL
ALT: 60 U/L — ABNORMAL HIGH (ref 0–44)
AST: 46 U/L — ABNORMAL HIGH (ref 15–41)
Albumin: 3.7 g/dL (ref 3.5–5.0)
Alkaline Phosphatase: 112 U/L (ref 38–126)
Anion gap: 9 (ref 5–15)
BUN: 21 mg/dL (ref 8–23)
CO2: 26 mmol/L (ref 22–32)
Calcium: 9.4 mg/dL (ref 8.9–10.3)
Chloride: 102 mmol/L (ref 98–111)
Creatinine, Ser: 1.2 mg/dL — ABNORMAL HIGH (ref 0.44–1.00)
GFR, Estimated: 45 mL/min — ABNORMAL LOW (ref >60)
Glucose, Bld: 302 mg/dL — ABNORMAL HIGH (ref 70–99)
Potassium: 4.6 mmol/L (ref 3.5–5.1)
Sodium: 137 mmol/L (ref 135–145)
Total Bilirubin: 0.8 mg/dL (ref 0.3–1.2)
Total Protein: 7.3 g/dL (ref 6.5–8.1)

## 2020-08-26 LAB — CBC WITH DIFFERENTIAL/PLATELET
Abs Immature Granulocytes: 0.01 10*3/uL (ref 0.00–0.07)
Basophils Absolute: 0.1 10*3/uL (ref 0.0–0.1)
Basophils Relative: 1 %
Eosinophils Absolute: 0.3 10*3/uL (ref 0.0–0.5)
Eosinophils Relative: 4 %
HCT: 38.1 % (ref 36.0–46.0)
Hemoglobin: 11.7 g/dL — ABNORMAL LOW (ref 12.0–15.0)
Immature Granulocytes: 0 %
Lymphocytes Relative: 32 %
Lymphs Abs: 2 10*3/uL (ref 0.7–4.0)
MCH: 29.3 pg (ref 26.0–34.0)
MCHC: 30.7 g/dL (ref 30.0–36.0)
MCV: 95.3 fL (ref 80.0–100.0)
Monocytes Absolute: 0.6 10*3/uL (ref 0.1–1.0)
Monocytes Relative: 10 %
Neutro Abs: 3.3 10*3/uL (ref 1.7–7.7)
Neutrophils Relative %: 53 %
Platelets: 204 10*3/uL (ref 150–400)
RBC: 4 MIL/uL (ref 3.87–5.11)
RDW: 13 % (ref 11.5–15.5)
WBC: 6.3 10*3/uL (ref 4.0–10.5)
nRBC: 0 % (ref 0.0–0.2)

## 2020-08-26 LAB — VITAMIN D 25 HYDROXY (VIT D DEFICIENCY, FRACTURES): Vit D, 25-Hydroxy: 44.76 ng/mL (ref 30–100)

## 2020-08-26 LAB — VITAMIN B12: Vitamin B-12: 1200 pg/mL — ABNORMAL HIGH (ref 180–914)

## 2020-08-26 LAB — LACTATE DEHYDROGENASE: LDH: 190 U/L (ref 98–192)

## 2020-08-26 LAB — IRON AND TIBC
Iron: 88 ug/dL (ref 28–170)
Saturation Ratios: 27 % (ref 10.4–31.8)
TIBC: 323 ug/dL (ref 250–450)
UIBC: 235 ug/dL

## 2020-08-26 LAB — FERRITIN: Ferritin: 250 ng/mL (ref 11–307)

## 2020-08-26 LAB — FOLATE: Folate: 46.1 ng/mL (ref >5.9)

## 2020-09-01 ENCOUNTER — Encounter: Payer: Self-pay | Admitting: Nurse Practitioner

## 2020-09-01 ENCOUNTER — Encounter: Payer: Self-pay | Admitting: Nutrition

## 2020-09-01 ENCOUNTER — Ambulatory Visit (INDEPENDENT_AMBULATORY_CARE_PROVIDER_SITE_OTHER): Payer: Medicare Other | Admitting: Nurse Practitioner

## 2020-09-01 ENCOUNTER — Other Ambulatory Visit: Payer: Self-pay

## 2020-09-01 ENCOUNTER — Encounter: Payer: Medicare Other | Attending: "Endocrinology | Admitting: Nutrition

## 2020-09-01 VITALS — Ht 67.0 in | Wt 202.0 lb

## 2020-09-01 VITALS — BP 159/78 | HR 62 | Ht 67.0 in | Wt 202.0 lb

## 2020-09-01 DIAGNOSIS — N182 Chronic kidney disease, stage 2 (mild): Secondary | ICD-10-CM | POA: Diagnosis not present

## 2020-09-01 DIAGNOSIS — E66812 Obesity, class 2: Secondary | ICD-10-CM

## 2020-09-01 DIAGNOSIS — E1122 Type 2 diabetes mellitus with diabetic chronic kidney disease: Secondary | ICD-10-CM

## 2020-09-01 DIAGNOSIS — E785 Hyperlipidemia, unspecified: Secondary | ICD-10-CM | POA: Diagnosis not present

## 2020-09-01 DIAGNOSIS — E782 Mixed hyperlipidemia: Secondary | ICD-10-CM

## 2020-09-01 DIAGNOSIS — Z794 Long term (current) use of insulin: Secondary | ICD-10-CM

## 2020-09-01 DIAGNOSIS — I10 Essential (primary) hypertension: Secondary | ICD-10-CM

## 2020-09-01 DIAGNOSIS — I739 Peripheral vascular disease, unspecified: Secondary | ICD-10-CM | POA: Diagnosis not present

## 2020-09-01 LAB — POCT GLYCOSYLATED HEMOGLOBIN (HGB A1C): Hemoglobin A1C: 9.2 % — AB (ref 4.0–5.6)

## 2020-09-01 MED ORDER — GLIPIZIDE ER 2.5 MG PO TB24: 2.5000 mg | ORAL_TABLET | Freq: Every day | ORAL | 3 refills | Status: DC

## 2020-09-01 NOTE — Progress Notes (Signed)
09/01/2020, 8:56 AM  Endocrinology follow-up note  Subjective:    Patient ID: Kimberly Freeman, female    DOB: 06-Jun-1941.  Kimberly Freeman is being seen in consultation for management of currently uncontrolled symptomatic diabetes requested by  [name], MD.   Past Medical History:  Diagnosis Date  . Arthritis   . CAD (coronary artery disease) 08/2019   60% RI with other 20-30% lesions at cath  . Diabetes mellitus, type 2 (HCC)   . Essential hypertension   . GERD (gastroesophageal reflux disease)   . Hyperlipidemia     Past Surgical History:  Procedure Laterality Date  . ABDOMINAL HYSTERECTOMY    . BIOPSY  07/18/2017   Procedure: BIOPSY;  Surgeon: [name], MD;  Location: [facility];  Service: Endoscopy;;  gastric  . CHOLECYSTECTOMY  2004  . COLONOSCOPY N/A 07/18/2017   Dr. [name]: Internal and external hemorrhoids.  No future screening/surveillance colonoscopies due to age.  . ESOPHAGOGASTRODUODENOSCOPY N/A 07/18/2017   Dr. [name]: Small hiatal hernia, patchy mild inflammation characterized by congestion, erosions, erythema in the cardia, gastric body and antrum.  Diffuse moderate inflammation in the duodenal bulb and second portion duodenum.  Biopsies benign.  No H. pylori.  Marland Kitchen LEFT HEART CATH AND CORONARY ANGIOGRAPHY N/A 09/21/2019   Procedure: LEFT HEART CATH AND CORONARY ANGIOGRAPHY;  Surgeon: [name], MD;  Location: [facility];  Service: Cardiovascular;  Laterality: N/A;    Social History   Socioeconomic History  . Marital status: Married    Spouse name: [name]   . Number of children: 0  . Years of education: 12+  . Highest education level: 12th grade  Occupational History  . Occupation: retired   Tobacco Use  . Smoking status: Never Smoker  . Smokeless tobacco: Never Used  Vaping Use  . Vaping Use: Never used  Substance and Sexual Activity  . Alcohol  use: No    Alcohol/week: 0.0 standard drinks  . Drug use: No  . Sexual activity: Not Currently    Partners: Male  Other Topics Concern  . Not on file  Social History Narrative   Lives alone with husband    Social Determinants of Health   Financial Resource Strain:   . Difficulty of Paying Living Expenses: Not on file  Food Insecurity:   . Worried About Programme researcher, broadcasting/film/video in the Last Year: Not on file  . Ran Out of Food in the Last Year: Not on file  Transportation Needs:   . Lack of Transportation (Medical): Not on file  . Lack of Transportation (Non-Medical): Not on file  Physical Activity:   . Days of Exercise per Week: Not on file  . Minutes of Exercise per Session: Not on file  Stress:   . Feeling of Stress : Not on file  Social Connections:   . Frequency of Communication with Friends and Family: Not on file  . Frequency of Social Gatherings with Friends and Family: Not on file  . Attends Religious Services: Not on file  . Active Member of Clubs or Organizations: Not on file  . Attends Banker Meetings: Not on  file  . Marital Status: Not on file    Family History  Problem Relation Age of Onset  . Leukemia Mother   . Cancer Mother   . Diabetes Mother   . Prostate cancer Father   . Lung cancer Sister 46  . Diabetes Sister   . Colon cancer Neg Hx     Outpatient Encounter Medications as of 09/01/2020  Medication Sig  . acetaminophen (TYLENOL) 500 MG tablet Take 500 mg by mouth daily as needed for mild pain or moderate pain. For pain   . amLODipine (NORVASC) 5 MG tablet Take 1 tablet (5 mg total) by mouth daily.  Marland Kitchen aspirin 81 MG chewable tablet Chew 1 tablet (81 mg total) by mouth daily.  Marland Kitchen atorvastatin (LIPITOR) 80 MG tablet Take 1 tablet (80 mg total) by mouth daily at 6 PM.  . betamethasone dipropionate 0.05 % cream Apply topically 2 (two) times daily.  . calcium-vitamin D (OSCAL WITH D) 500-200 MG-UNIT tablet Take 1 tablet by mouth.  Marland Kitchen glipiZIDE  (GLIPIZIDE XL) 2.5 MG 24 hr tablet Take 1 tablet (2.5 mg total) by mouth daily with breakfast.  . glucose blood (ONE TOUCH ULTRA TEST) test strip USE AS INSTRUCTED FOR TWICE DAILY TESTING  . Insulin Glargine (BASAGLAR KWIKPEN) 100 UNIT/ML Inject 0.5 mLs (50 Units total) into the skin at bedtime.  . Insulin Pen Needle (BD PEN NEEDLE MICRO U/F) 32G X 6 MM MISC USE TO INJECT INSULIN DAILY  . irbesartan (AVAPRO) 300 MG tablet TAKE 1 TABLET DAILY  . Lancets (ONETOUCH ULTRASOFT) lancets USE AS DIRECTED TWICE DAILY  . metoprolol tartrate (LOPRESSOR) 100 MG tablet Take 1 tablet (100 mg total) by mouth 2 (two) times daily.  . Multiple Vitamin (MULTIVITAMIN WITH MINERALS) TABS tablet Take 1 tablet by mouth daily.  . pantoprazole (PROTONIX) 40 MG tablet TAKE 1 TABLET BY MOUTH 30 MINUTES BEFORE FIRST MEAL  . polyethylene glycol (MIRALAX / GLYCOLAX) 17 g packet Take 17 g by mouth daily as needed.  Marland Kitchen spironolactone (ALDACTONE) 50 MG tablet Take 1 tablet (50 mg total) by mouth daily.  . vitamin B-12 (CYANOCOBALAMIN) 500 MCG tablet Take 500 mcg by mouth daily.  . [DISCONTINUED] glipiZIDE (GLIPIZIDE XL) 5 MG 24 hr tablet Take 1 tablet (5 mg total) by mouth daily with breakfast.   No facility-administered encounter medications on file as of 09/01/2020.    ALLERGIES: Allergies  Allergen Reactions  . Poison Oak Extract [Poison Oak Extract] Hives  . Sulfonamide Derivatives Itching    Burning sensation all over body    VACCINATION STATUS: Immunization History  Administered Date(s) Administered  . Fluad Quad(high Dose 65+) 07/27/2019, 06/23/2020  . Influenza Split 08/01/2011, 07/31/2012  . Influenza Whole 07/29/2007, 07/20/2008, 07/21/2009, 07/19/2010  . Influenza, High Dose Seasonal PF 07/23/2018  . Influenza,inj,Quad PF,6+ Mos 08/07/2013, 06/15/2014, 07/19/2015, 06/28/2016, 07/25/2017  . Moderna SARS-COVID-2 Vaccination 12/22/2019, 01/19/2020  . Pneumococcal Conjugate-13 10/12/2014  . Pneumococcal  Polysaccharide-23 03/16/2004, 08/29/2012  . Td 03/16/2004  . Tdap 03/31/2012  . Zoster 01/02/2007    Diabetes She presents for her follow-up diabetic visit. She has type 2 diabetes mellitus. Onset time: She was diagnosed at approximate age of 47 years. Her disease course has been improving. Hypoglycemia symptoms include nervousness/anxiousness and tremors. Pertinent negatives for hypoglycemia include no confusion, headaches, pallor or seizures. Associated symptoms include fatigue, polydipsia and polyuria. Pertinent negatives for diabetes include no chest pain and no polyphagia. There are no hypoglycemic complications. Symptoms are improving. Diabetic complications include heart disease  and nephropathy. Risk factors for coronary artery disease include diabetes mellitus, dyslipidemia, family history, hypertension, obesity, sedentary lifestyle and post-menopausal. Current diabetic treatment includes insulin injections and oral agent (monotherapy) (She is currently on Basaglar 50 units nightly, and glipizide 10 mg XL daily at breakfast.  ). She is compliant with treatment most of the time. Her weight is fluctuating minimally. She is following a generally unhealthy diet. When asked about meal planning, she reported none. She has had a previous visit with a dietitian. She participates in exercise intermittently. (She presents today with her meter and logs showing inconsistent monitoring pattern.  Analysis of her meter shows 7-day average of 109 with 6 readings, 14-day average of 120 with 12 readings, and 30-day average is 124 with 26 readings.  She does report s/s of hypoglycemia at least once a week.  Her POCT A1c today is 9.2%, improving from last visit of 9.6%.  She reports she has been caring for her sick husband lately and has not done as well taking care of herself.) An ACE inhibitor/angiotensin II receptor blocker is not being taken. She sees a podiatrist.Eye exam is current.  Hyperlipidemia This is a  chronic problem. The current episode started more than 1 year ago. The problem is controlled. Recent lipid tests were reviewed and are normal. Exacerbating diseases include chronic renal disease, diabetes and obesity. Factors aggravating her hyperlipidemia include beta blockers. Pertinent negatives include no chest pain, myalgias or shortness of breath. Current antihyperlipidemic treatment includes statins. The current treatment provides mild improvement of lipids. There are no compliance problems.  Risk factors for coronary artery disease include diabetes mellitus, dyslipidemia, obesity, hypertension, a sedentary lifestyle and post-menopausal.  Hypertension This is a chronic problem. The current episode started more than 1 year ago. The problem is unchanged. The problem is controlled. Pertinent negatives include no chest pain, headaches, palpitations or shortness of breath. There are no associated agents to hypertension. Risk factors for coronary artery disease include dyslipidemia, diabetes mellitus, sedentary lifestyle, obesity and post-menopausal state. Past treatments include beta blockers, calcium channel blockers and diuretics. The current treatment provides mild improvement. There are no compliance problems.  Hypertensive end-organ damage includes CAD/MI. Identifiable causes of hypertension include chronic renal disease.    Review of systems  Constitutional: + Minimally fluctuating body weight,  current Body mass index is 31.64 kg/m. , no fatigue, no subjective hyperthermia, no subjective hypothermia Eyes: no blurry vision, no xerophthalmia ENT: no sore throat, no nodules palpated in throat, no dysphagia/odynophagia, no hoarseness Cardiovascular: no chest pain, no shortness of breath, no palpitations, no leg swelling Respiratory: no cough, no shortness of breath Gastrointestinal: no nausea/vomiting/diarrhea Musculoskeletal: no muscle/joint aches Skin: no rashes, no hyperemia Neurological: no  tremors, no numbness, no tingling, no dizziness Psychiatric: no depression, no anxiety   Objective:    BP (!) 159/78 (BP Location: Left Arm, Patient Position: Sitting)   Pulse 62   Ht 5\' 7"  (1.702 m)   Wt 202 lb (91.6 kg)   BMI 31.64 kg/m   Wt Readings from Last 3 Encounters:  09/01/20 202 lb (91.6 kg)  07/26/20 204 lb (92.5 kg)  07/12/20 204 lb (92.5 kg)    BP Readings from Last 3 Encounters:  09/01/20 (!) 159/78  07/12/20 (!) 159/64  07/11/20 (!) 167/76    Physical Exam- Limited  Constitutional:  Body mass index is 31.64 kg/m. , not in acute distress, normal state of mind Eyes:  EOMI, no exophthalmos Neck: Supple Thyroid: No gross goiter Cardiovascular: RRR,  no murmers, rubs, or gallops, no edema Respiratory: Adequate breathing efforts, no crackles, rales, rhonchi, or wheezing Musculoskeletal: no gross deformities, strength intact in all four extremities, no gross restriction of joint movements Skin:  no rashes, no hyperemia Neurological: no tremor with outstretched hands  CMP     Component Value Date/Time   NA 137 08/26/2020 1137   NA 143 05/23/2020 1117   K 4.6 08/26/2020 1137   CL 102 08/26/2020 1137   CO2 26 08/26/2020 1137   GLUCOSE 302 (H) 08/26/2020 1137   BUN 21 08/26/2020 1137   BUN 14 05/23/2020 1117   CREATININE 1.20 (H) 08/26/2020 1137   CREATININE 1.14 (H) 12/15/2019 1048   CALCIUM 9.4 08/26/2020 1137   PROT 7.3 08/26/2020 1137   ALBUMIN 3.7 08/26/2020 1137   AST 46 (H) 08/26/2020 1137   ALT 60 (H) 08/26/2020 1137   ALKPHOS 112 08/26/2020 1137   BILITOT 0.8 08/26/2020 1137   GFRNONAA 45 (L) 08/26/2020 1137   GFRNONAA 45 (L) 12/15/2019 1048   GFRAA 55 (L) 05/23/2020 1117   GFRAA 53 (L) 12/15/2019 1048     Diabetic Labs (most recent): Lab Results  Component Value Date   HGBA1C 9.2 (A) 09/01/2020   HGBA1C 9.6 (A) 05/23/2020   HGBA1C 9.6 05/23/2020   HGBA1C 9.6 (A) 05/23/2020   HGBA1C 9.6 (A) 05/23/2020     Lipid Panel ( most  recent) Lipid Panel     Component Value Date/Time   CHOL 147 12/15/2019 1048   TRIG 127 12/15/2019 1048   HDL 45 (L) 12/15/2019 1048   CHOLHDL 3.3 12/15/2019 1048   VLDL 21 03/22/2017 0803   LDLCALC 80 12/15/2019 1048      Lab Results  Component Value Date   TSH 4.360 05/23/2020   TSH 6.85 (H) 07/24/2019   TSH 5.19 (H) 08/04/2018   TSH 3.81 04/10/2016   TSH 2.410 12/13/2015   TSH 5.289 (H) 11/14/2015   TSH 3.963 06/14/2014   TSH 3.427 06/03/2013   TSH 3.333 01/22/2012   TSH 3.737 05/15/2010   FREET4 1.0 12/15/2019   FREET4 0.8 07/24/2019   FREET4 0.9 08/04/2018   FREET4 0.78 12/13/2015   FREET4 0.84 11/14/2015       Assessment & Plan:   1. Type 2 diabetes mellitus with stage 2 chronic kidney disease, with long-term current use of insulin (HCC)  - Kimberly Freeman has currently uncontrolled symptomatic type 2 DM since  79 years of age. Recent labs reviewed.  She presents today with her meter and logs showing inconsistent monitoring pattern.  Analysis of her meter shows 7-day average of 109 with 6 readings, 14-day average of 120 with 12 readings, and 30-day average is 124 with 26 readings.  She does report s/s of hypoglycemia at least once a week.  Her POCT A1c today is 9.2%, improving from last visit of 9.6%.  She reports she has been caring for her sick husband lately and has not done as well taking care of herself.  - I had a long discussion with her about the progressive nature of diabetes and the pathology behind its complications. -her diabetes is complicated by stage 2 CKD, obesity /sedentary life, and she remains at a high risk for more acute and chronic complications which include CAD, CVA, CKD, retinopathy, and neuropathy. These are all discussed in detail with her.  - Nutritional counseling repeated at each appointment due to patients tendency to fall back in to old habits.  - The patient admits  there is a room for improvement in their diet and drink choices. -   Suggestion is made for the patient to avoid simple carbohydrates from their diet including Cakes, Sweet Desserts / Pastries, Ice Cream, Soda (diet and regular), Sweet Tea, Candies, Chips, Cookies, Sweet Pastries,  Store Bought Juices, Alcohol in Excess of  1-2 drinks a day, Artificial Sweeteners, Coffee Creamer, and "Sugar-free" Products. This will help patient to have stable blood glucose profile and potentially avoid unintended weight gain.   - I encouraged the patient to switch to  unprocessed or minimally processed complex starch and increased protein intake (animal or plant source), fruits, and vegetables.   - Patient is advised to stick to a routine mealtimes to eat 3 meals  a day and avoid unnecessary snacks ( to snack only to correct hypoglycemia).  - I have approached her with the following individualized plan to manage  her diabetes and patient agrees:   - she will continue to need at least basal insulin in order for her to achieve control of diabetes to target.   -Given her frequency of s/s of hypoglycemia, will lower her Glipizide to 2.5 mg XL daily with breakfast.  She is advised to continue Basaglar 50 units SQ daily at bedtime.  -I encouraged her to restart monitoring her glucose twice daily, before breakfast and before bed, and call the clinic if she has readings less than 70 or greater than 300 for 3 tests in a row.  -She will be considered for low-dose Metformin or incretin therapy during her next visit.  - Specific targets for  A1c;  LDL, HDL,  and Triglycerides were discussed with the patient.  2) Blood Pressure /Hypertension:  Her blood pressure is controlled to target for her age.  She is advised to continue Norvasc 5 mg po daily, Irbesartan 300 mg po daily, Metoprolol 100 mg po twice daily, and Aldactone 50 mg po daily.  3) Lipids/Hyperlipidemia:    Her most recent lipid panel from 12/15/19 shows controlled LDL at 80.  She is advised to continue Atorvastatin 80 mg po daily  at bedtime.  Side effects and precautions discussed with her.  4)  Weight/Diet:  Her Body mass index is 31.64 kg/m.  -   clearly complicating her diabetes care.   she is  a candidate for weight loss. I discussed with her the fact that loss of 5 - 10% of her  current body weight will have the most impact on her diabetes management.  Exercise, and detailed carbohydrates information provided  -  detailed on discharge instructions.  5) Chronic Care/Health Maintenance: -she  is on Statin medications and  is encouraged to initiate and continue to follow up with Ophthalmology, Dentist,  Podiatrist at least yearly or according to recommendations, and advised to   stay away from smoking. I have recommended yearly flu vaccine and pneumonia vaccine at least every 5 years; moderate intensity exercise for up to 150 minutes weekly; and  sleep for at least 7 hours a day.  - she is  advised to maintain close follow up with [name], MD for primary care needs, as well as her other providers for optimal and coordinated care.   - Time spent on this patient care encounter:  35 min, of which > 50% was spent in  counseling and the rest reviewing her blood glucose logs , discussing her hypoglycemia and hyperglycemia episodes, reviewing her current and  previous labs / studies  ( including abstraction from  other facilities) and medications  doses and developing a  long term treatment plan and documenting her care.   Please refer to Patient Instructions for Blood Glucose Monitoring and Insulin/Medications Dosing Guide"  in media tab for additional information. Please  also refer to " Patient Self Inventory" in the Media  tab for reviewed elements of pertinent patient history.  Mauro Kaufmann participated in the discussions, expressed understanding, and voiced agreement with the above plans.  All questions were answered to her satisfaction. she is encouraged to contact clinic should she have any questions or  concerns prior to her return visit.   Follow up plan: - Return in about 2 weeks (around 09/15/2020) for Diabetes follow up, Bring glucometer and logs, No previsit labs.  Ronny Bacon, [facility] [address] [place], [place] [address] Phone: [phone] Fax: [phone]  09/01/2020, 8:56 AM

## 2020-09-01 NOTE — Patient Instructions (Addendum)
°  Goals  Increase lower carb vegetables. Increase water intake to 84 oz a day Cut back on starchy vegetables. Use exericise bike 15 minutes a day Get A1C down 7% Don't eat past 7 pm Eat only veggies for snacks after supper.

## 2020-09-01 NOTE — Progress Notes (Signed)
  Medical Nutrition Therapy:  Appt start time: 0900 end time:  0930  Assessment:  Primary concerns today: Diabetes Type 2, Obesity. Lives with her husband who has beginning stages of dementia and CHF.  Saw Whitney today, Glipizide down to 2.5 mg/dl. 50 units of Basaglar. FBS:  112-140'.  Bedtime: 200-250's. A1C down to 9.2% , down from 9.6% Changes: Has been eating smaller portions.   Lab Results  Component Value Date   HGBA1C 9.2 (A) 09/01/2020   Preferred Learning Style:   No preference indicated   Learning Readiness:   Ready  Change in progress   MEDICATIONS:    DIETARY INTAKE:.    24-hr recall:  B (  9AM): Cherrios Medley-oat flakes, granola and honey nut cherrios 2 cup, with milk, lactose milk, unsweet tea.  Snk ( AM):   L ( 1 PM): Tuna salad with ritz crackers, 6, water Snk ( PM): necot nabs, unsweet tea with equal D ( 6/7 PM): string bean, rib, white potatoes, Unsweet tea Snk ( PM):  Beverages: water and unsweet tea  Usual physical activity: Has exercise bike  Estimated energy needs: 1200  calories 135 g carbohydrates 80 g protein 35 g fat  Progress Towards Goal(s):  In progress.   Nutritional Diagnosis:  NB-1.1 Food and nutrition-related knowledge deficit As related to Diabetes Type 2.  As evidenced by A1C 9.6%.    Intervention:  Nutrition and Diabetes education provided on My Plate, CHO counting, meal planning, portion sizes, timing of meals, avoiding snacks between meals unless having a low blood sugar, target ranges for A1C and blood sugars, signs/symptoms and treatment of hyper/hypoglycemia, monitoring blood sugars, taking medications as prescribed, benefits of exercising 30 minutes per day and prevention of complications of DM.  Goals  Increase lower carb vegetables. Increase water intake to 84 oz a day Cut back on starchy vegetables. Use exericise bike 15 minutes a day Get A1C down 7% Eat before 7 pm  No snacks after supper.    Teaching  Method Utilized:  Visual Auditory Hands on  Handouts given during visit include:  The Plate Method   Meal Plan Card    Barriers to learning/adherence to lifestyle change: none  Demonstrated degree of understanding via:  Teach Back   Monitoring/Evaluation:  Dietary intake, exercise, , and body weight in 1 month(s).

## 2020-09-01 NOTE — Patient Instructions (Signed)

## 2020-09-02 ENCOUNTER — Inpatient Hospital Stay (HOSPITAL_BASED_OUTPATIENT_CLINIC_OR_DEPARTMENT_OTHER): Payer: Medicare Other | Admitting: Oncology

## 2020-09-02 VITALS — BP 157/92 | HR 70 | Temp 97.2°F | Resp 18 | Wt 203.4 lb

## 2020-09-02 DIAGNOSIS — D500 Iron deficiency anemia secondary to blood loss (chronic): Secondary | ICD-10-CM

## 2020-09-02 DIAGNOSIS — D649 Anemia, unspecified: Secondary | ICD-10-CM | POA: Diagnosis not present

## 2020-09-02 DIAGNOSIS — R7889 Finding of other specified substances, not normally found in blood: Secondary | ICD-10-CM | POA: Diagnosis not present

## 2020-09-02 DIAGNOSIS — Z79899 Other long term (current) drug therapy: Secondary | ICD-10-CM | POA: Diagnosis not present

## 2020-09-02 DIAGNOSIS — N189 Chronic kidney disease, unspecified: Secondary | ICD-10-CM | POA: Diagnosis not present

## 2020-09-02 NOTE — Progress Notes (Signed)
[facility] [address], [place] [address]   CLINIC:  Medical Oncology/Hematology  PCP:  [name], MD [address] [place] [address] [phone]   REASON FOR VISIT: Follow-up for iron deficiency anemia  CURRENT THERAPY: Observation   INTERVAL HISTORY:  Kimberly Freeman 79 y.o. female returns for routine follow-up for iron deficiency anemia. Patient reports she is doing well since her last visit.  She has mild constipation but uses MiraLAX which appears to be working.  Denies any additional complaints.  She denies any bright red bleeding per rectum or melena.  She denies any easy bruising or bleeding. Denies any nausea, vomiting, or diarrhea. Denies any new pains. Had not noticed any recent bleeding such as epistaxis, hematuria or hematochezia. Denies recent chest pain on exertion, shortness of breath on minimal exertion, pre-syncopal episodes, or palpitations. Denies any numbness or tingling in hands or feet. Denies any recent fevers, infections, or recent hospitalizations. Patient reports appetite at 100% and energy level at 50%.  She is eating well maintain her weight at this time.   REVIEW OF SYSTEMS:  Review of Systems  Gastrointestinal: Positive for constipation.  All other systems reviewed and are negative.    PAST MEDICAL/SURGICAL HISTORY:  Past Medical History:  Diagnosis Date  . Arthritis   . CAD (coronary artery disease) 08/2019   60% RI with other 20-30% lesions at cath  . Diabetes mellitus, type 2 (HCC)   . Essential hypertension   . GERD (gastroesophageal reflux disease)   . Hyperlipidemia    Past Surgical History:  Procedure Laterality Date  . ABDOMINAL HYSTERECTOMY    . BIOPSY  07/18/2017   Procedure: BIOPSY;  Surgeon: [name], MD;  Location: [facility]ITE;  Service: Endoscopy;;  gastric  . CHOLECYSTECTOMY  2004  . COLONOSCOPY N/A 07/18/2017   Dr. [name]: Internal and external hemorrhoids.  No future  screening/surveillance colonoscopies due to age.  . ESOPHAGOGASTRODUODENOSCOPY N/A 07/18/2017   Dr. [name]: Small hiatal hernia, patchy mild inflammation characterized by congestion, erosions, erythema in the cardia, gastric body and antrum.  Diffuse moderate inflammation in the duodenal bulb and second portion duodenum.  Biopsies benign.  No H. pylori.  Marland Kitchen LEFT HEART CATH AND CORONARY ANGIOGRAPHY N/A 09/21/2019   Procedure: LEFT HEART CATH AND CORONARY ANGIOGRAPHY;  Surgeon: [name], MD;  Location: [facility] INVASIVE CV LAB;  Service: Cardiovascular;  Laterality: N/A;     SOCIAL HISTORY:  Social History   Socioeconomic History  . Marital status: Married    Spouse name: [name]   . Number of children: 0  . Years of education: 12+  . Highest education level: 12th grade  Occupational History  . Occupation: retired   Tobacco Use  . Smoking status: Never Smoker  . Smokeless tobacco: Never Used  Vaping Use  . Vaping Use: Never used  Substance and Sexual Activity  . Alcohol use: No    Alcohol/week: 0.0 standard drinks  . Drug use: No  . Sexual activity: Not Currently    Partners: Male  Other Topics Concern  . Not on file  Social History Narrative   Lives alone with husband    Social Determinants of Health   Financial Resource Strain: Low Risk   . Difficulty of Paying Living Expenses: Not very hard  Food Insecurity: No Food Insecurity  . Worried About Programme researcher, broadcasting/film/video in the Last Year: Never true  . Ran Out of Food in  the Last Year: Never true  Transportation Needs: No Transportation Needs  . Lack of Transportation (Medical): No  . Lack of Transportation (Non-Medical): No  Physical Activity: Inactive  . Days of Exercise per Week: 0 days  . Minutes of Exercise per Session: 0 min  Stress: No Stress Concern Present  . Feeling of Stress : Not at all  Social Connections: Moderately Isolated  . Frequency of Communication with Friends and Family: More than three times a week    . Frequency of Social Gatherings with Friends and Family: Twice a week  . Attends Religious Services: Never  . Active Member of Clubs or Organizations: No  . Attends BankerClub or Organization Meetings: Never  . Marital Status: Married  Catering managerntimate Partner Violence: Not At Risk  . Fear of Current or Ex-Partner: No  . Emotionally Abused: No  . Physically Abused: No  . Sexually Abused: No    FAMILY HISTORY:  Family History  Problem Relation Age of Onset  . Leukemia Mother   . Cancer Mother   . Diabetes Mother   . Prostate cancer Father   . Lung cancer Sister [id number]  . Diabetes Sister   . Colon cancer Neg Hx     CURRENT MEDICATIONS:  Outpatient Encounter Medications as of 09/02/2020  Medication Sig  . acetaminophen (TYLENOL) 500 MG tablet Take 500 mg by mouth daily as needed for mild pain or moderate pain. For pain   . amLODipine (NORVASC) 5 MG tablet Take 1 tablet (5 mg total) by mouth daily.  Marland Kitchen. aspirin 81 MG chewable tablet Chew 1 tablet (81 mg total) by mouth daily.  Marland Kitchen. atorvastatin (LIPITOR) 80 MG tablet Take 1 tablet (80 mg total) by mouth daily at 6 PM.  . betamethasone dipropionate 0.05 % cream Apply topically 2 (two) times daily.  . calcium-vitamin D (OSCAL WITH D) 500-200 MG-UNIT tablet Take 1 tablet by mouth.  Marland Kitchen. glipiZIDE (GLIPIZIDE XL) 2.5 MG 24 hr tablet Take 1 tablet (2.5 mg total) by mouth daily with breakfast.  . glucose blood (ONE TOUCH ULTRA TEST) test strip USE AS INSTRUCTED FOR TWICE DAILY TESTING  . Insulin Glargine (BASAGLAR KWIKPEN) 100 UNIT/ML Inject 0.5 mLs (50 Units total) into the skin at bedtime.  . Insulin Pen Needle (BD PEN NEEDLE MICRO U/F) 32G X 6 MM MISC USE TO INJECT INSULIN DAILY  . irbesartan (AVAPRO) 300 MG tablet TAKE 1 TABLET DAILY  . Lancets (ONETOUCH ULTRASOFT) lancets USE AS DIRECTED TWICE DAILY  . Multiple Vitamin (MULTIVITAMIN WITH MINERALS) TABS tablet Take 1 tablet by mouth daily.  . pantoprazole (PROTONIX) 40 MG tablet TAKE 1 TABLET BY MOUTH 30  MINUTES BEFORE FIRST MEAL  . polyethylene glycol (MIRALAX / GLYCOLAX) 17 g packet Take 17 g by mouth daily as needed.  Marland Kitchen. spironolactone (ALDACTONE) 50 MG tablet Take 1 tablet (50 mg total) by mouth daily.  . vitamin B-12 (CYANOCOBALAMIN) 500 MCG tablet Take 500 mcg by mouth daily.  . metoprolol tartrate (LOPRESSOR) 100 MG tablet Take 1 tablet (100 mg total) by mouth 2 (two) times daily.   No facility-administered encounter medications on file as of 09/02/2020.    ALLERGIES:  Allergies  Allergen Reactions  . Poison Oak Extract [Poison Oak Extract] Hives  . Sulfonamide Derivatives Itching    Burning sensation all over body     PHYSICAL EXAM:  ECOG Performance status: 1  Vitals:   09/02/20 1057  BP: (!) 157/92  Pulse: 70  Resp: 18  Temp: (!) 97.2 F (  36.2 C)  SpO2: 98%   Filed Weights   09/02/20 1057  Weight: 203 lb 6.4 oz (92.3 kg)   Physical Exam Constitutional:      Appearance: Normal appearance. She is normal weight.  Cardiovascular:     Rate and Rhythm: Normal rate and regular rhythm.     Heart sounds: Normal heart sounds.  Pulmonary:     Effort: Pulmonary effort is normal.     Breath sounds: Normal breath sounds.  Abdominal:     General: Bowel sounds are normal.     Palpations: Abdomen is soft.  Musculoskeletal:        General: Normal range of motion.  Skin:    General: Skin is warm.  Neurological:     Mental Status: She is alert and oriented to person, place, and time. Mental status is at baseline.  Psychiatric:        Mood and Affect: Mood normal.        Behavior: Behavior normal.        Thought Content: Thought content normal.        Judgment: Judgment normal.      LABORATORY DATA:  I have reviewed the labs as listed.  CBC    Component Value Date/Time   WBC 6.3 08/26/2020 1137   RBC 4.00 08/26/2020 1137   HGB 11.7 (L) 08/26/2020 1137   HCT 38.1 08/26/2020 1137   PLT 204 08/26/2020 1137   MCV 95.3 08/26/2020 1137   MCH 29.3 08/26/2020 1137     MCHC 30.7 08/26/2020 1137   RDW 13.0 08/26/2020 1137   LYMPHSABS 2.0 08/26/2020 1137   MONOABS 0.6 08/26/2020 1137   EOSABS 0.3 08/26/2020 1137   BASOSABS 0.1 08/26/2020 1137   CMP Latest Ref Rng & Units 08/26/2020 05/23/2020 02/25/2020  Glucose 70 - 99 mg/dL 098(J) 191(Y) 782(N)  BUN 8 - 23 mg/dL 21 14 56(O)  Creatinine 0.44 - 1.00 mg/dL 1.30(Q) 6.57(Q) 4.69(G)  Sodium 135 - 145 mmol/L 137 143 139  Potassium 3.5 - 5.1 mmol/L 4.6 4.6 4.3  Chloride 98 - 111 mmol/L 102 104 102  CO2 22 - 32 mmol/L 26 26 27   Calcium 8.9 - 10.3 mg/dL 9.4 9.8 9.2  Total Protein 6.5 - 8.1 g/dL 7.3 - 6.8  Total Bilirubin 0.3 - 1.2 mg/dL 0.8 - 0.7  Alkaline Phos 38 - 126 U/L 112 - 89  AST 15 - 41 U/L 46(H) - 21  ALT 0 - 44 U/L 60(H) - 31    All questions were answered to patient's stated satisfaction. Encouraged patient to call with any new concerns or questions before his next visit to the cancer center and we can certain see him sooner, if needed.     ASSESSMENT & PLAN:  1.  Normocytic anemia: -Previously thought to be from chronic blood loss.  There is also a component of CKD. -EGD on 07/18/2017 showed gastritis, duodenitis with no H. pylori. -Colonoscopy on 07/18/2017 showed internal and external hemorrhoids. -She denies any bright red bleeding per rectum or melena.  No B symptoms or recent hospitalizations. -Labs done on 08/26/2020 show a hemoglobin of 11.7, ferritin of 250 and iron saturations of 27%. -She will not require any additional IV iron at this time. -Repeat lab work in 6 weeks.  2.  Elevated LFTs: -Appears to be new. -We will continue to trend LFTs at this time. -She does not appear to have any symptoms. -Discussed medications that potentially could be causing elevated liver enzymes.  3.  Chronic kidney disease: -Creatinine 1.20. -Recommend hydration. -Will recheck in 6 months  Disposition: Return to clinic in 6 months with repeat lab work.  No problem-specific Assessment & Plan  notes found for this encounter.   Greater than 50% was spent in counseling and coordination of care with this patient including but not limited to discussion of the relevant topics above (See A&P) including, but not limited to diagnosis and management of acute and chronic medical conditions.   Orders placed this encounter:  No orders of the defined types were placed in this encounter.  Durenda Hurt, NP 09/02/2020 1:54 PM  [facility] [phone]

## 2020-09-05 DIAGNOSIS — I10 Essential (primary) hypertension: Secondary | ICD-10-CM | POA: Diagnosis not present

## 2020-09-05 DIAGNOSIS — E1122 Type 2 diabetes mellitus with diabetic chronic kidney disease: Secondary | ICD-10-CM | POA: Diagnosis not present

## 2020-09-05 DIAGNOSIS — Z794 Long term (current) use of insulin: Secondary | ICD-10-CM | POA: Diagnosis not present

## 2020-09-05 DIAGNOSIS — N182 Chronic kidney disease, stage 2 (mild): Secondary | ICD-10-CM | POA: Diagnosis not present

## 2020-09-06 ENCOUNTER — Encounter: Payer: Self-pay | Admitting: Family Medicine

## 2020-09-06 ENCOUNTER — Other Ambulatory Visit: Payer: Self-pay

## 2020-09-06 ENCOUNTER — Ambulatory Visit (INDEPENDENT_AMBULATORY_CARE_PROVIDER_SITE_OTHER): Payer: Medicare Other | Admitting: Family Medicine

## 2020-09-06 VITALS — BP 153/81 | HR 66 | Resp 16 | Ht 67.0 in | Wt 203.0 lb

## 2020-09-06 DIAGNOSIS — R079 Chest pain, unspecified: Secondary | ICD-10-CM

## 2020-09-06 DIAGNOSIS — R3915 Urgency of urination: Secondary | ICD-10-CM | POA: Diagnosis not present

## 2020-09-06 DIAGNOSIS — R350 Frequency of micturition: Secondary | ICD-10-CM | POA: Insufficient documentation

## 2020-09-06 DIAGNOSIS — I10 Essential (primary) hypertension: Secondary | ICD-10-CM

## 2020-09-06 DIAGNOSIS — N182 Chronic kidney disease, stage 2 (mild): Secondary | ICD-10-CM | POA: Diagnosis not present

## 2020-09-06 DIAGNOSIS — I2510 Atherosclerotic heart disease of native coronary artery without angina pectoris: Secondary | ICD-10-CM

## 2020-09-06 DIAGNOSIS — R5383 Other fatigue: Secondary | ICD-10-CM | POA: Diagnosis not present

## 2020-09-06 DIAGNOSIS — N3941 Urge incontinence: Secondary | ICD-10-CM | POA: Diagnosis not present

## 2020-09-06 DIAGNOSIS — Z794 Long term (current) use of insulin: Secondary | ICD-10-CM

## 2020-09-06 DIAGNOSIS — E782 Mixed hyperlipidemia: Secondary | ICD-10-CM

## 2020-09-06 DIAGNOSIS — R9431 Abnormal electrocardiogram [ECG] [EKG]: Secondary | ICD-10-CM | POA: Diagnosis not present

## 2020-09-06 DIAGNOSIS — F439 Reaction to severe stress, unspecified: Secondary | ICD-10-CM

## 2020-09-06 DIAGNOSIS — E1122 Type 2 diabetes mellitus with diabetic chronic kidney disease: Secondary | ICD-10-CM

## 2020-09-06 DIAGNOSIS — R32 Unspecified urinary incontinence: Secondary | ICD-10-CM | POA: Insufficient documentation

## 2020-09-06 LAB — POCT URINALYSIS DIP (CLINITEK)
Bilirubin, UA: NEGATIVE
Blood, UA: NEGATIVE
Glucose, UA: NEGATIVE mg/dL
Ketones, POC UA: NEGATIVE mg/dL
Leukocytes, UA: NEGATIVE
Nitrite, UA: NEGATIVE
POC PROTEIN,UA: NEGATIVE
Spec Grav, UA: 1.02 (ref 1.010–1.025)
Urobilinogen, UA: 0.2 E.U./dL
pH, UA: 5.5 (ref 5.0–8.0)

## 2020-09-06 MED ORDER — AMLODIPINE BESYLATE 2.5 MG PO TABS: 2.5000 mg | ORAL_TABLET | Freq: Every day | ORAL | 1 refills | Status: DC

## 2020-09-06 NOTE — Progress Notes (Signed)
Kimberly Freeman     MRN: [id number]      DOB: 1941/09/26   HPI Kimberly Freeman is here for follow up and re-evaluation of chronic medical conditions, medication management and review of any available recent lab and radiology data.  Preventive health is updated, specifically  Cancer screening and Immunization.   Questions or concerns regarding consultations or procedures which the Kimberly Freeman has had in the interim are  addressed. The Kimberly Freeman denies any adverse reactions to current medications since the last visit.  2  Month h/o increased exertional fatigue with known cAD, refer to Cardiologu urgently eKG today shows  increased t inversion since last year Reports improved though still fluctuating blood sugars, working on this ROS Denies recent fever or chills. Denies sinus pressure, nasal congestion, ear pain or sore throat. Denies chest congestion, productive cough or wheezing. Denies chest pains, palpitations and leg swelling Denies abdominal pain, nausea, vomiting,diarrhea or constipation.   Denies dysuria, fc/o frequency and incontinene Denies joint pain, swelling and limitation in mobility. Denies headaches, seizures, numbness, or tingling. Denies depression,  Increased anxiety no  insomnia. Denies skin break down or rash.   PE  BP (!) 153/81   Pulse 66   Resp 16   Ht 5\' 7"  (1.702 m)   Wt 203 lb (92.1 kg)   SpO2 97%   BMI 31.79 kg/m   Patient alert and oriented and in no cardiopulmonary distress.  HEENT: No facial asymmetry, EOMI,     Neck supple .  Chest: Clear to auscultation bilaterally.  CVS: S1, S2 no murmurs, no S3.Regular rate.  ABD: Soft non tender.   Ext: No edema  MS: Adequate though reduced  ROM spine, shoulders, hips and knees.  Skin: Intact, no ulcerations or rash noted.  Psych: Good eye contact, normal affect. Memory intact not anxious or depressed appearing.  CNS: CN 2-12 intact, power,  normal throughout.no focal deficits noted.   Assessment & Plan Malignant  hypertension Uncontrolled , increase amlodipine dose, re eval in 3 months, will see Cardiology before this DASH diet and commitment to daily physical activity for a minimum of 30 minutes discussed and encouraged, as a part of hypertension management. The importance of attaining a healthy weight is also discussed.  BP/Weight 09/06/2020 09/02/2020 09/01/2020 09/01/2020 07/26/2020 07/12/2020 07/11/2020  Systolic BP 153 157 - 159 - 159 167  Diastolic BP 81 92 - 78 - 64 76  Wt. (Lbs) 203 203.4 202 202 204 204 205  BMI 31.79 31.86 31.64 31.64 31.95 31.95 32.11       Type 2 diabetes mellitus with stage 2 chronic kidney disease, with long-term current use of insulin (HCC) Uncontrolled managed by Endo, Kimberly Freeman compliat and ivolved in treatment  Kimberly Freeman is reminded of the importance of commitment to daily physical activity for 30 minutes or more, as able and the need to limit carbohydrate intake to 30 to 60 grams per meal to help with blood sugar control.   The need to take medication as prescribed, test blood sugar as directed, and to call between visits if there is a concern that blood sugar is uncontrolled is also discussed.   Kimberly Freeman is reminded of the importance of daily foot exam, annual eye examination, and good blood sugar, blood pressure and cholesterol control.  Diabetic Labs Latest Ref Rng & Units 09/05/2020 09/01/2020 08/26/2020 05/23/2020 05/23/2020  HbA1c 4.0 - 5.6 % - 9.2(A) - 9.6(A) 9.6(A)  Microalbumin mg/dL - - - - -  Micro/Creat Ratio <  30 mcg/mg creat - - - - -  Chol 100 - 199 mg/dL 563 - - - -  HDL >87 mg/dL 46 - - - -  Calc LDL 0 - 99 mg/dL 91 - - - -  Triglycerides 0 - 149 mg/dL 564 - - - -  Creatinine 0.57 - 1.00 mg/dL 3.32(R) - 5.18(A) 4.16(S) -   BP/Weight 09/06/2020 09/02/2020 09/01/2020 09/01/2020 07/26/2020 07/12/2020 07/11/2020  Systolic BP 153 157 - 159 - 159 167  Diastolic BP 81 92 - 78 - 64 76  Wt. (Lbs) 203 203.4 202 202 204 204 205  BMI 31.79 31.86 31.64 31.64  31.95 31.95 32.11   Foot/eye exam completion dates Latest Ref Rng & Units 03/01/2020 12/15/2019  Eye Exam No Retinopathy No Retinopathy -  Foot exam Order - - -  Foot Form Completion - - Done        Mixed hyperlipidemia Hyperlipidemia:Low fat diet discussed and encouraged.   Lipid Panel  Lab Results  Component Value Date   CHOL 156 09/05/2020   HDL 46 09/05/2020   LDLCALC 91 09/05/2020   TRIG 106 09/05/2020   CHOLHDL 3.4 09/05/2020  currently at goal , though lower lDL may be more appropriate will see wht cardiology recommends     Chest pain of uncertain etiology 2 month h/o increased exertion al fatigue, known cAD eKG in office more marked t wave inversion than in 2020, RBB unchanged, urgent referral to Cardiology  Stress at home Increased in past 2 months with continued decline of spouse's health, suggested therapy, not interested now  Urinary frequency Normal uA. Due to incontinence, she will reach out to her health ins to see if supplies can be covered  Urinary incontinence educated re Ke[employer] execises and voiding on a schedule . Kimberly Freeman to reach out to her insurance to see if supplies can be covered

## 2020-09-06 NOTE — Assessment & Plan Note (Signed)
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 Pt to reach out to her insurance to see if supplies can be covered

## 2020-09-06 NOTE — Assessment & Plan Note (Signed)
2 month h/o increased exertion al fatigue, known cAD eKG in office more marked t wave inversion than in 2020, RBB unchanged, urgent referral to Cardiology

## 2020-09-06 NOTE — Assessment & Plan Note (Signed)
Increased in past 2 months with continued decline of spouse's health, suggested therapy, not interested now

## 2020-09-06 NOTE — Assessment & Plan Note (Signed)
Uncontrolled managed by Endo, pt compliat and ivolved in treatment  Kimberly Freeman is reminded of the importance of commitment to daily physical activity for 30 minutes or more, as able and the need to limit carbohydrate intake to 30 to 60 grams per meal to help with blood sugar control.   The need to take medication as prescribed, test blood sugar as directed, and to call between visits if there is a concern that blood sugar is uncontrolled is also discussed.   Kimberly Freeman is reminded of the importance of daily foot exam, annual eye examination, and good blood sugar, blood pressure and cholesterol control.  Diabetic Labs Latest Ref Rng & Units 09/05/2020 09/01/2020 08/26/2020 05/23/2020 05/23/2020  HbA1c 4.0 - 5.6 % - 9.2(A) - 9.6(A) 9.6(A)  Microalbumin mg/dL - - - - -  Micro/Creat Ratio <30 mcg/mg creat - - - - -  Chol 100 - 199 mg/dL 706 - - - -  HDL >23 mg/dL 46 - - - -  Calc LDL 0 - 99 mg/dL 91 - - - -  Triglycerides 0 - 149 mg/dL 762 - - - -  Creatinine 0.57 - 1.00 mg/dL 8.31(D) - 1.76(H) 6.07(P) -   BP/Weight 09/06/2020 09/02/2020 09/01/2020 09/01/2020 07/26/2020 07/12/2020 07/11/2020  Systolic BP 153 157 - 159 - 159 167  Diastolic BP 81 92 - 78 - 64 76  Wt. (Lbs) 203 203.4 202 202 204 204 205  BMI 31.79 31.86 31.64 31.64 31.95 31.95 32.11   Foot/eye exam completion dates Latest Ref Rng & Units 03/01/2020 12/15/2019  Eye Exam No Retinopathy No Retinopathy -  Foot exam Order - - -  Foot Form Completion - - Done

## 2020-09-06 NOTE — Assessment & Plan Note (Signed)
Hyperlipidemia:Low fat diet discussed and encouraged.   Lipid Panel  Lab Results  Component Value Date   CHOL 156 09/05/2020   HDL 46 09/05/2020   LDLCALC 91 09/05/2020   TRIG 106 09/05/2020   CHOLHDL 3.4 09/05/2020  currently at goal , though lower lDL may be more appropriate will see wht cardiology recommends

## 2020-09-06 NOTE — Assessment & Plan Note (Signed)
Normal uA. Due to incontinence, she will reach out to her health ins to see if supplies can be covered

## 2020-09-06 NOTE — Assessment & Plan Note (Signed)
Uncontrolled , increase amlodipine dose, re eval in 3 months, will see Cardiology before this DASH diet and commitment to daily physical activity for a minimum of 30 minutes discussed and encouraged, as a part of hypertension management. The importance of attaining a healthy weight is also discussed.  BP/Weight 09/06/2020 09/02/2020 09/01/2020 09/01/2020 07/26/2020 07/12/2020 07/11/2020  Systolic BP 153 157 - 159 - 159 167  Diastolic BP 81 92 - 78 - 64 76  Wt. (Lbs) 203 203.4 202 202 204 204 205  BMI 31.79 31.86 31.64 31.64 31.95 31.95 32.11

## 2020-09-06 NOTE — Patient Instructions (Addendum)
Follow-up in office with MD in 3 months call if you need me sooner.  New additional medication for blood pressure is amlodipine 2.5 mg daily.  Continue to take amlodipine 5 mg daily, so your total daily dose of amlodipine is 7.5 mg daily.  The new medication is sent to your mail order.  Continue all other blood pressure medication as before.  You are being referred to cardiology for reevaluation due to recent increase in fatigue with activity.You will have an EKG in the office today  You need to go to the ED if you develop chest pain or light headedness with activity  Continue to work on good health and call for help if needed   Please call your insurance to see if they will pay for incontinence supplies , let them know of our discussion  Best for 2022!

## 2020-09-07 ENCOUNTER — Encounter: Payer: Self-pay | Admitting: Cardiology

## 2020-09-07 ENCOUNTER — Ambulatory Visit (INDEPENDENT_AMBULATORY_CARE_PROVIDER_SITE_OTHER): Payer: Medicare Other | Admitting: Cardiology

## 2020-09-07 VITALS — BP 128/68 | HR 65 | Ht 67.0 in | Wt 202.2 lb

## 2020-09-07 DIAGNOSIS — E785 Hyperlipidemia, unspecified: Secondary | ICD-10-CM

## 2020-09-07 DIAGNOSIS — I10 Essential (primary) hypertension: Secondary | ICD-10-CM | POA: Diagnosis not present

## 2020-09-07 DIAGNOSIS — R9431 Abnormal electrocardiogram [ECG] [EKG]: Secondary | ICD-10-CM | POA: Diagnosis not present

## 2020-09-07 DIAGNOSIS — R5383 Other fatigue: Secondary | ICD-10-CM | POA: Insufficient documentation

## 2020-09-07 DIAGNOSIS — E1122 Type 2 diabetes mellitus with diabetic chronic kidney disease: Secondary | ICD-10-CM

## 2020-09-07 DIAGNOSIS — Z23 Encounter for immunization: Secondary | ICD-10-CM | POA: Diagnosis not present

## 2020-09-07 DIAGNOSIS — I2510 Atherosclerotic heart disease of native coronary artery without angina pectoris: Secondary | ICD-10-CM | POA: Diagnosis not present

## 2020-09-07 DIAGNOSIS — I517 Cardiomegaly: Secondary | ICD-10-CM | POA: Diagnosis not present

## 2020-09-07 DIAGNOSIS — Z794 Long term (current) use of insulin: Secondary | ICD-10-CM

## 2020-09-07 DIAGNOSIS — I4510 Unspecified right bundle-branch block: Secondary | ICD-10-CM | POA: Diagnosis not present

## 2020-09-07 DIAGNOSIS — N182 Chronic kidney disease, stage 2 (mild): Secondary | ICD-10-CM | POA: Diagnosis not present

## 2020-09-07 LAB — CMP14+EGFR
ALT: 29 IU/L (ref 0–32)
AST: 26 IU/L (ref 0–40)
Albumin/Globulin Ratio: 1.4 (ref 1.2–2.2)
Albumin: 4.2 g/dL (ref 3.6–4.6)
Alkaline Phosphatase: 134 IU/L — ABNORMAL HIGH (ref 44–121)
BUN/Creatinine Ratio: 14 (ref 12–28)
BUN: 17 mg/dL (ref 8–27)
Bilirubin Total: 0.4 mg/dL (ref 0.0–1.2)
CO2: 25 mmol/L (ref 20–29)
Calcium: 9.7 mg/dL (ref 8.7–10.3)
Chloride: 104 mmol/L (ref 96–106)
Creatinine, Ser: 1.22 mg/dL — ABNORMAL HIGH (ref 0.57–1.00)
GFR calc Af Amer: 48 mL/min/{1.73_m2} — ABNORMAL LOW (ref >59)
GFR calc non Af Amer: 42 mL/min/{1.73_m2} — ABNORMAL LOW (ref >59)
Globulin, Total: 3.1 g/dL (ref 1.5–4.5)
Glucose: 86 mg/dL (ref 65–99)
Potassium: 4.4 mmol/L (ref 3.5–5.2)
Sodium: 142 mmol/L (ref 134–144)
Total Protein: 7.3 g/dL (ref 6.0–8.5)

## 2020-09-07 LAB — LIPID PANEL
Chol/HDL Ratio: 3.4 ratio (ref 0.0–4.4)
Cholesterol, Total: 156 mg/dL (ref 100–199)
HDL: 46 mg/dL (ref >39)
LDL Chol Calc (NIH): 91 mg/dL (ref 0–99)
Triglycerides: 106 mg/dL (ref 0–149)
VLDL Cholesterol Cal: 19 mg/dL (ref 5–40)

## 2020-09-07 LAB — MICROALBUMIN, URINE: Microalbumin, Urine: 8 ug/mL

## 2020-09-07 NOTE — Assessment & Plan Note (Signed)
Controlled.  

## 2020-09-07 NOTE — Assessment & Plan Note (Signed)
Pt sent from her PCP with complaints of fatigue and abnormal EKG

## 2020-09-07 NOTE — Assessment & Plan Note (Signed)
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

## 2020-09-07 NOTE — Patient Instructions (Signed)
Medication Instructions:  Continue all current medications.   Labwork: none  Testing/Procedures: none  Follow-Up: 6 months   Any Other Special Instructions Will Be Listed Below (If Applicable).   If you need a refill on your cardiac medications before your next appointment, please call your pharmacy.  

## 2020-09-07 NOTE — Progress Notes (Signed)
Cardiology Office Note:    Date:  09/07/2020   ID:  Kimberly Freeman, DOB 03-26-1941, MRN [id number]  PCP:  Kimberly Perches, MD  Cardiologist:  Kimberly Freeman (new)  Electrophysiologist:  None   Referring MD: Kimberly Perches, MD   No chief complaint on file.   History of Present Illness:    Kimberly Freeman is a 79 y.o. female with a hx of severe LVH by EKG.  She is always had an abnormal EKG.  She did have a heart catheterization November 2020 that showed a 60% ramus intermedius lesion otherwise no significant coronary disease.  Echocardiogram showed her ejection fraction to be 70 to 75% with grade 1 diastolic dysfunction.  The patient recently saw her primary care provider.  The patient complained of some vague fatigue.  An EKG was done which showed sinus rhythm with a new right bundle branch block and diffuse T wave inversion.  The patient was referred to [place] for further evaluation.  Today in the office Kimberly Freeman denies any chest pain.  She does admit she has been under some stress, her husband apparently has Alzheimer's dementia.  She denies any unusual dyspnea.  She has not had syncope or near syncope or orthostatic symptoms.  Past Medical History:  Diagnosis Date  . Arthritis   . CAD (coronary artery disease) 08/2019   60% RI with other 20-30% lesions at cath  . Diabetes mellitus, type 2 (HCC)   . Essential hypertension   . GERD (gastroesophageal reflux disease)   . Hyperlipidemia     Past Surgical History:  Procedure Laterality Date  . ABDOMINAL HYSTERECTOMY    . BIOPSY  07/18/2017   Procedure: BIOPSY;  Surgeon: [name], MD;  Location: [facility];  Service: Endoscopy;;  gastric  . CHOLECYSTECTOMY  2004  . COLONOSCOPY N/A 07/18/2017   Dr. [name]: Internal and external hemorrhoids.  No future screening/surveillance colonoscopies due to age.  . ESOPHAGOGASTRODUODENOSCOPY N/A 07/18/2017   Dr. [name]: Small hiatal hernia, patchy mild inflammation characterized by  congestion, erosions, erythema in the cardia, gastric body and antrum.  Diffuse moderate inflammation in the duodenal bulb and second portion duodenum.  Biopsies benign.  No H. pylori.  Marland Kitchen LEFT HEART CATH AND CORONARY ANGIOGRAPHY N/A 09/21/2019   Procedure: LEFT HEART CATH AND CORONARY ANGIOGRAPHY;  Surgeon: [name], MD;  Location: [facility];  Service: Cardiovascular;  Laterality: N/A;    Current Medications: Current Meds  Medication Sig  . acetaminophen (TYLENOL) 500 MG tablet Take 500 mg by mouth daily as needed for mild pain or moderate pain. For pain   . amLODipine (NORVASC) 2.5 MG tablet Take 2.5 mg by mouth every evening.  Marland Kitchen amLODipine (NORVASC) 5 MG tablet Take 5 mg by mouth every morning.  Marland Kitchen aspirin 81 MG chewable tablet Chew 1 tablet (81 mg total) by mouth daily.  Marland Kitchen atorvastatin (LIPITOR) 80 MG tablet Take 1 tablet (80 mg total) by mouth daily at 6 PM.  . betamethasone dipropionate 0.05 % cream Apply topically 2 (two) times daily.  . calcium-vitamin D (OSCAL WITH D) 500-200 MG-UNIT tablet Take 1 tablet by mouth.  Marland Kitchen glipiZIDE (GLIPIZIDE XL) 2.5 MG 24 hr tablet Take 1 tablet (2.5 mg total) by mouth daily with breakfast.  . glucose blood (ONE TOUCH ULTRA TEST) test strip USE AS INSTRUCTED FOR TWICE DAILY TESTING  . Insulin Glargine (BASAGLAR KWIKPEN) 100 UNIT/ML Inject 0.5 mLs (50 Units total) into the skin at bedtime.  Marland Kitchen  Insulin Pen Needle (BD PEN NEEDLE MICRO U/F) 32G X 6 MM MISC USE TO INJECT INSULIN DAILY  . irbesartan (AVAPRO) 300 MG tablet TAKE 1 TABLET DAILY  . Lancets (ONETOUCH ULTRASOFT) lancets USE AS DIRECTED TWICE DAILY  . metoprolol tartrate (LOPRESSOR) 100 MG tablet Take 1 tablet (100 mg total) by mouth 2 (two) times daily.  . Multiple Vitamin (MULTIVITAMIN WITH MINERALS) TABS tablet Take 1 tablet by mouth daily.  . pantoprazole (PROTONIX) 40 MG tablet TAKE 1 TABLET BY MOUTH 30 MINUTES BEFORE FIRST MEAL  . polyethylene glycol (MIRALAX / GLYCOLAX) 17 g  packet Take 17 g by mouth daily as needed.  Marland Kitchen. spironolactone (ALDACTONE) 50 MG tablet Take 1 tablet (50 mg total) by mouth daily.  . vitamin B-12 (CYANOCOBALAMIN) 500 MCG tablet Take 500 mcg by mouth daily.     Allergies:   Poison oak extract [poison oak extract] and Sulfonamide derivatives   Social History   Socioeconomic History  . Marital status: Married    Spouse name: Kimberly Freeman   . Number of children: 0  . Years of education: 12+  . Highest education level: 12th grade  Occupational History  . Occupation: retired   Tobacco Use  . Smoking status: Never Smoker  . Smokeless tobacco: Never Used  Vaping Use  . Vaping Use: Never used  Substance and Sexual Activity  . Alcohol use: No    Alcohol/week: 0.0 standard drinks  . Drug use: No  . Sexual activity: Not Currently    Partners: Male  Other Topics Concern  . Not on file  Social History Narrative   Lives alone with husband    Social Determinants of Health   Financial Resource Strain: Low Risk   . Difficulty of Paying Living Expenses: Not very hard  Food Insecurity: No Food Insecurity  . Worried About Programme researcher, broadcasting/film/videounning Out of Food in the Last Year: Never true  . Ran Out of Food in the Last Year: Never true  Transportation Needs: No Transportation Needs  . Lack of Transportation (Medical): No  . Lack of Transportation (Non-Medical): No  Physical Activity: Inactive  . Days of Exercise per Week: 0 days  . Minutes of Exercise per Session: 0 min  Stress: No Stress Concern Present  . Feeling of Stress : Not at all  Social Connections: Moderately Isolated  . Frequency of Communication with Friends and Family: More than three times a week  . Frequency of Social Gatherings with Friends and Family: Twice a week  . Attends Religious Services: Never  . Active Member of Clubs or Organizations: No  . Attends BankerClub or Organization Meetings: Never  . Marital Status: Married     Family History: The patient's family history includes Cancer  in her mother; Diabetes in her mother and sister; Leukemia in her mother; Lung cancer (age of onset: 752) in her sister; Prostate cancer in her father. There is no history of Colon cancer.  ROS:   Please see the history of present illness.     All other systems reviewed and are negative.  EKGs/Labs/Other Studies Reviewed:    The following studies were reviewed today: Coronary angiogram Nov 2020-  Lat Ramus lesion is 60% stenosed.  Mid LAD lesion is 30% stenosed.  Prox RCA lesion is 20% stenosed.  Mid RCA lesion is 20% stenosed.   Echo 09/21/2019- IMPRESSIONS    1. Left ventricular ejection fraction, by visual estimation, is 70 to  75%. The left ventricle has hyperdynamic function. There is severely  increased left ventricular hypertrophy.  2. Left ventricular diastolic parameters are consistent with Grade I  diastolic dysfunction (impaired relaxation).  3. Global right ventricle has normal systolic function.The right  ventricular size is normal. Mildly increased right ventricular wall  thickness.  4. Left atrial size was mild-moderately dilated.  5. Right atrial size was normal.  6. Mild aortic valve annular calcification.  7. Mild mitral annular calcification.  8. The mitral valve is grossly normal. Trace mitral valve regurgitation.  9. The tricuspid valve is grossly normal. Tricuspid valve regurgitation  is mild.  10. The aortic valve is tricuspid. Aortic valve regurgitation is trivial.  Moderately sclerotic to mildly stenotic aortic valve.  11. The pulmonic valve was not well visualized. Pulmonic valve  regurgitation is trivial.  12. TR signal is inadequate for assessing pulmonary artery systolic  pressure.  13. The inferior vena cava is dilated in size with >50% respiratory  variability, suggesting right atrial pressure of 8 mmHg.    EKG:  EKG is not ordered today.  The ekg ordered today demonstrates NSR, RBBB, TWI, 1, AVL, V3-V6.  Recent  Labs: 09/21/2019: Magnesium 1.7 05/23/2020: TSH 4.360 08/26/2020: Hemoglobin 11.7; Platelets 204 09/05/2020: ALT 29; BUN 17; Creatinine, Ser 1.22; Potassium 4.4; Sodium 142  Recent Lipid Panel    Component Value Date/Time   CHOL 156 09/05/2020 0810   TRIG 106 09/05/2020 0810   HDL 46 09/05/2020 0810   CHOLHDL 3.4 09/05/2020 0810   CHOLHDL 3.3 12/15/2019 1048   VLDL 21 03/22/2017 0803   LDLCALC 91 09/05/2020 0810   LDLCALC 80 12/15/2019 1048    Physical Exam:    VS:  BP 128/68   Pulse 65   Ht 5\' 7"  (1.702 m)   Wt 202 lb 3.2 oz (91.7 kg)   SpO2 94%   BMI 31.67 kg/m     Wt Readings from Last 3 Encounters:  09/07/20 202 lb 3.2 oz (91.7 kg)  09/06/20 203 lb (92.1 kg)  09/02/20 203 lb 6.4 oz (92.3 kg)     GEN: Well nourished, well developed in no acute distress HEENT: Normal NECK: No JVD; transmitted murmur LYMPHATICS: No lymphadenopathy CARDIAC: RRR, 2/6 systolic murmur LSB, no rubs, gallops RESPIRATORY:  Clear to auscultation without rales, wheezing or rhonchi  ABDOMEN: Soft, non-tender, non-distended MUSCULOSKELETAL:  No edema; No deformity  SKIN: Warm and dry NEUROLOGIC:  Alert and oriented x 3 PSYCHIATRIC:  Normal affect   ASSESSMENT:    Fatigue Pt sent from her PCP with complaints of fatigue and abnormal EKG  Nonspecific abnormal electrocardiogram (ECG) (EKG) TWI secondary to LVH  CAD (coronary artery disease) 50-60% RI at cath Nov 2011- aggressive medical Rx  Essential hypertension Controlled  RBBB Noted on her EKG 09/06/2020- this appears to be new but the patient has no symptoms to suggest angina so this is of questionable significance.   PLAN:    Same Rx- f/u in 6 months.    Medication Adjustments/Labs and Tests Ordered: Current medicines are reviewed at length with the patient today.  Concerns regarding medicines are outlined above.  No orders of the defined types were placed in this encounter.  No orders of the defined types were placed in  this encounter.   Patient Instructions  Medication Instructions:  Continue all current medications.  Labwork: none  Testing/Procedures: none  Follow-Up: 6 months   Any Other Special Instructions Will Be Listed Below (If Applicable).  If you need a refill on your cardiac medications before your next appointment, please  call your pharmacy.     Signed, Corine Shelter, PA-C  09/07/2020 3:26 PM    [facility]

## 2020-09-07 NOTE — Assessment & Plan Note (Signed)
Noted on her EKG 09/06/2020- this appears to be new but the patient has no symptoms to suggest angina so this is of questionable significance.

## 2020-09-07 NOTE — Assessment & Plan Note (Signed)
50-60% RI at cath Nov 2011- aggressive medical Rx

## 2020-09-08 ENCOUNTER — Encounter: Payer: Self-pay | Admitting: Family Medicine

## 2020-09-08 NOTE — Assessment & Plan Note (Signed)
Increased t wave inversion in lateral leads with new exertional fatigue , worse in past several weeks. Refer to Cardiology asap

## 2020-09-18 ENCOUNTER — Other Ambulatory Visit: Payer: Self-pay | Admitting: Physician Assistant

## 2020-09-20 ENCOUNTER — Encounter: Payer: Self-pay | Admitting: Nurse Practitioner

## 2020-09-20 ENCOUNTER — Other Ambulatory Visit: Payer: Self-pay

## 2020-09-20 ENCOUNTER — Ambulatory Visit (INDEPENDENT_AMBULATORY_CARE_PROVIDER_SITE_OTHER): Payer: Medicare Other | Admitting: Nurse Practitioner

## 2020-09-20 ENCOUNTER — Encounter: Payer: Self-pay | Admitting: Nutrition

## 2020-09-20 ENCOUNTER — Encounter: Payer: Medicare Other | Attending: Nurse Practitioner | Admitting: Nutrition

## 2020-09-20 VITALS — BP 146/75 | HR 70 | Ht 67.0 in | Wt 202.0 lb

## 2020-09-20 DIAGNOSIS — Z794 Long term (current) use of insulin: Secondary | ICD-10-CM | POA: Insufficient documentation

## 2020-09-20 DIAGNOSIS — I10 Essential (primary) hypertension: Secondary | ICD-10-CM

## 2020-09-20 DIAGNOSIS — E669 Obesity, unspecified: Secondary | ICD-10-CM

## 2020-09-20 DIAGNOSIS — E1122 Type 2 diabetes mellitus with diabetic chronic kidney disease: Secondary | ICD-10-CM

## 2020-09-20 DIAGNOSIS — N182 Chronic kidney disease, stage 2 (mild): Secondary | ICD-10-CM | POA: Diagnosis not present

## 2020-09-20 DIAGNOSIS — I739 Peripheral vascular disease, unspecified: Secondary | ICD-10-CM

## 2020-09-20 DIAGNOSIS — E782 Mixed hyperlipidemia: Secondary | ICD-10-CM | POA: Diagnosis not present

## 2020-09-20 DIAGNOSIS — E785 Hyperlipidemia, unspecified: Secondary | ICD-10-CM | POA: Diagnosis not present

## 2020-09-20 MED ORDER — BASAGLAR KWIKPEN 100 UNIT/ML ~~LOC~~ SOPN: 45.0000 [IU] | PEN_INJECTOR | Freq: Every day | SUBCUTANEOUS | 2 refills | Status: DC

## 2020-09-20 NOTE — Patient Instructions (Signed)

## 2020-09-20 NOTE — Patient Instructions (Addendum)
Goals Increase riding bike to 3 times per week. Keep working on water intake Prevent low blood sugars. Get A1C down 7%.

## 2020-09-20 NOTE — Telephone Encounter (Signed)
This is a [place] pt, Dr. McDowell 

## 2020-09-20 NOTE — Progress Notes (Signed)
09/20/2020, 9:11 AM  Endocrinology follow-up note  Subjective:    Patient ID: Kimberly Freeman, female    DOB: 01/31/1941.  Kimberly Freeman is being seen in consultation for management of currently uncontrolled symptomatic diabetes requested by  [name], MD.   Past Medical History:  Diagnosis Date  . Arthritis   . CAD (coronary artery disease) 08/2019   60% RI with other 20-30% lesions at cath  . Diabetes mellitus, type 2 (HCC)   . Essential hypertension   . GERD (gastroesophageal reflux disease)   . Hyperlipidemia     Past Surgical History:  Procedure Laterality Date  . ABDOMINAL HYSTERECTOMY    . BIOPSY  07/18/2017   Procedure: BIOPSY;  Surgeon: [name], MD;  Location: [facility];  Service: Endoscopy;;  gastric  . CHOLECYSTECTOMY  2004  . COLONOSCOPY N/A 07/18/2017   Dr. [name]: Internal and external hemorrhoids.  No future screening/surveillance colonoscopies due to age.  . ESOPHAGOGASTRODUODENOSCOPY N/A 07/18/2017   Dr. [name]: Small hiatal hernia, patchy mild inflammation characterized by congestion, erosions, erythema in the cardia, gastric body and antrum.  Diffuse moderate inflammation in the duodenal bulb and second portion duodenum.  Biopsies benign.  No H. pylori.  Marland Kitchen LEFT HEART CATH AND CORONARY ANGIOGRAPHY N/A 09/21/2019   Procedure: LEFT HEART CATH AND CORONARY ANGIOGRAPHY;  Surgeon: [name], MD;  Location: [facility];  Service: Cardiovascular;  Laterality: N/A;    Social History   Socioeconomic History  . Marital status: Married    Spouse name: [name]   . Number of children: 0  . Years of education: 12+  . Highest education level: 12th grade  Occupational History  . Occupation: retired   Tobacco Use  . Smoking status: Never Smoker  . Smokeless tobacco: Never Used  Vaping Use  . Vaping Use: Never used  Substance and Sexual Activity  . Alcohol  use: No    Alcohol/week: 0.0 standard drinks  . Drug use: No  . Sexual activity: Not Currently    Partners: Male  Other Topics Concern  . Not on file  Social History Narrative   Lives alone with husband    Social Determinants of Health   Financial Resource Strain: Low Risk   . Difficulty of Paying Living Expenses: Not very hard  Food Insecurity: No Food Insecurity  . Worried About Programme researcher, broadcasting/film/video in the Last Year: Never true  . Ran Out of Food in the Last Year: Never true  Transportation Needs: No Transportation Needs  . Lack of Transportation (Medical): No  . Lack of Transportation (Non-Medical): No  Physical Activity: Inactive  . Days of Exercise per Week: 0 days  . Minutes of Exercise per Session: 0 min  Stress: No Stress Concern Present  . Feeling of Stress : Not at all  Social Connections: Moderately Isolated  . Frequency of Communication with Friends and Family: More than three times a week  . Frequency of Social Gatherings with Friends and Family: Twice a week  . Attends Religious Services: Never  . Active Member of Clubs or Organizations: No  . Attends Banker Meetings: Never  .  Marital Status: Married    Family History  Problem Relation Age of Onset  . Leukemia Mother   . Cancer Mother   . Diabetes Mother   . Prostate cancer Father   . Lung cancer Sister 3  . Diabetes Sister   . Colon cancer Neg Hx     Outpatient Encounter Medications as of 09/20/2020  Medication Sig  . acetaminophen (TYLENOL) 500 MG tablet Take 500 mg by mouth daily as needed for mild pain or moderate pain. For pain   . amLODipine (NORVASC) 2.5 MG tablet Take 2.5 mg by mouth every evening.  Marland Kitchen amLODipine (NORVASC) 5 MG tablet Take 5 mg by mouth every morning.  Marland Kitchen aspirin 81 MG chewable tablet Chew 1 tablet (81 mg total) by mouth daily.  Marland Kitchen atorvastatin (LIPITOR) 80 MG tablet Take 1 tablet (80 mg total) by mouth daily at 6 PM.  . betamethasone dipropionate 0.05 % cream Apply  topically 2 (two) times daily.  . calcium-vitamin D (OSCAL WITH D) 500-200 MG-UNIT tablet Take 1 tablet by mouth.  Marland Kitchen glipiZIDE (GLIPIZIDE XL) 2.5 MG 24 hr tablet Take 1 tablet (2.5 mg total) by mouth daily with breakfast.  . glucose blood (ONE TOUCH ULTRA TEST) test strip USE AS INSTRUCTED FOR TWICE DAILY TESTING  . Insulin Glargine (BASAGLAR KWIKPEN) 100 UNIT/ML Inject 45 Units into the skin at bedtime.  . Insulin Pen Needle (BD PEN NEEDLE MICRO U/F) 32G X 6 MM MISC USE TO INJECT INSULIN DAILY  . irbesartan (AVAPRO) 300 MG tablet TAKE 1 TABLET DAILY  . Lancets (ONETOUCH ULTRASOFT) lancets USE AS DIRECTED TWICE DAILY  . metoprolol tartrate (LOPRESSOR) 100 MG tablet TAKE 1 TABLET TWICE A DAY  . Multiple Vitamin (MULTIVITAMIN WITH MINERALS) TABS tablet Take 1 tablet by mouth daily.  . pantoprazole (PROTONIX) 40 MG tablet TAKE 1 TABLET BY MOUTH 30 MINUTES BEFORE FIRST MEAL  . polyethylene glycol (MIRALAX / GLYCOLAX) 17 g packet Take 17 g by mouth daily as needed.  Marland Kitchen spironolactone (ALDACTONE) 50 MG tablet Take 1 tablet (50 mg total) by mouth daily.  . vitamin B-12 (CYANOCOBALAMIN) 500 MCG tablet Take 500 mcg by mouth daily.  . [DISCONTINUED] Insulin Glargine (BASAGLAR KWIKPEN) 100 UNIT/ML Inject 0.5 mLs (50 Units total) into the skin at bedtime.   No facility-administered encounter medications on file as of 09/20/2020.    ALLERGIES: Allergies  Allergen Reactions  . Poison Oak Extract [Poison Oak Extract] Hives  . Sulfonamide Derivatives Itching    Burning sensation all over body    VACCINATION STATUS: Immunization History  Administered Date(s) Administered  . Fluad Quad(high Dose 65+) 07/27/2019, 06/23/2020  . Influenza Split 08/01/2011, 07/31/2012  . Influenza Whole 07/29/2007, 07/20/2008, 07/21/2009, 07/19/2010  . Influenza, High Dose Seasonal PF 07/23/2018  . Influenza,inj,Quad PF,6+ Mos 08/07/2013, 06/15/2014, 07/19/2015, 06/28/2016, 07/25/2017  . Moderna SARS-COVID-2 Vaccination  12/22/2019, 01/19/2020  . Pneumococcal Conjugate-13 10/12/2014  . Pneumococcal Polysaccharide-23 03/16/2004, 08/29/2012  . Td 03/16/2004  . Tdap 03/31/2012  . Zoster 01/02/2007    Diabetes She presents for her follow-up diabetic visit. She has type 2 diabetes mellitus. Onset time: She was diagnosed at approximate age of 1 years. Her disease course has been improving. Hypoglycemia symptoms include nervousness/anxiousness and tremors. Pertinent negatives for hypoglycemia include no confusion, headaches, pallor or seizures. Associated symptoms include fatigue. Pertinent negatives for diabetes include no chest pain, no polydipsia, no polyphagia and no polyuria. There are no hypoglycemic complications. Symptoms are improving. Diabetic complications include heart disease and nephropathy. Risk  factors for coronary artery disease include diabetes mellitus, dyslipidemia, family history, hypertension, obesity, sedentary lifestyle and post-menopausal. Current diabetic treatment includes insulin injections and oral agent (monotherapy). She is compliant with treatment most of the time. Her weight is fluctuating minimally. She is following a generally healthy diet. When asked about meal planning, she reported none. She has had a previous visit with a dietitian. She participates in exercise intermittently. Her home blood glucose trend is decreasing steadily. Her breakfast blood glucose range is generally 70-90 mg/dl. Her bedtime blood glucose range is generally 140-180 mg/dl. (She presents today with her meter and logs showing adequate re-engagement in her care with significantly improved glycemic profile overall.  Her fasting glycemic profile is tightening and her postprandial glycemic profile is at goal.  She has changed her diet since last visit with A1c of 9.2%.) An ACE inhibitor/angiotensin II receptor blocker is not being taken. She sees a podiatrist.Eye exam is current.  Hyperlipidemia This is a chronic problem.  The current episode started more than 1 year ago. The problem is controlled. Recent lipid tests were reviewed and are normal. Exacerbating diseases include chronic renal disease, diabetes and obesity. Factors aggravating her hyperlipidemia include beta blockers. Pertinent negatives include no chest pain, myalgias or shortness of breath. Current antihyperlipidemic treatment includes statins. The current treatment provides mild improvement of lipids. There are no compliance problems.  Risk factors for coronary artery disease include diabetes mellitus, dyslipidemia, obesity, hypertension, a sedentary lifestyle and post-menopausal.  Hypertension This is a chronic problem. The current episode started more than 1 year ago. The problem is unchanged. The problem is controlled. Pertinent negatives include no chest pain, headaches, palpitations or shortness of breath. There are no associated agents to hypertension. Risk factors for coronary artery disease include dyslipidemia, diabetes mellitus, sedentary lifestyle, obesity and post-menopausal state. Past treatments include beta blockers, calcium channel blockers and diuretics. The current treatment provides mild improvement. There are no compliance problems.  Hypertensive end-organ damage includes CAD/MI. Identifiable causes of hypertension include chronic renal disease.    Review of systems  Constitutional: + Minimally fluctuating body weight,  current Body mass index is 31.64 kg/m. , no fatigue, no subjective hyperthermia, no subjective hypothermia Eyes: no blurry vision, no xerophthalmia ENT: no sore throat, no nodules palpated in throat, no dysphagia/odynophagia, no hoarseness Cardiovascular: no chest pain, no shortness of breath, no palpitations, no leg swelling Respiratory: no cough, no shortness of breath Gastrointestinal: no nausea/vomiting/diarrhea Musculoskeletal: no muscle/joint aches Skin: no rashes, no hyperemia Neurological: no tremors, no  numbness, no tingling, no dizziness Psychiatric: no depression, no anxiety   Objective:    BP (!) 146/75 (BP Location: Left Arm, Patient Position: Sitting)   Pulse 70   Ht 5\' 7"  (1.702 m)   Wt 202 lb (91.6 kg)   BMI 31.64 kg/m   Wt Readings from Last 3 Encounters:  09/20/20 202 lb (91.6 kg)  09/07/20 202 lb 3.2 oz (91.7 kg)  09/06/20 203 lb (92.1 kg)    BP Readings from Last 3 Encounters:  09/20/20 (!) 146/75  09/07/20 128/68  09/06/20 (!) 153/81    Physical Exam- Limited  Constitutional:  Body mass index is 31.64 kg/m. , not in acute distress, normal state of mind Eyes:  EOMI, no exophthalmos Neck: Supple Cardiovascular: RRR, no murmers, rubs, or gallops, no edema Respiratory: Adequate breathing efforts, no crackles, rales, rhonchi, or wheezing Musculoskeletal: no gross deformities, strength intact in all four extremities, no gross restriction of joint movements Skin:  no rashes,  no hyperemia Neurological: no tremor with outstretched hands  CMP     Component Value Date/Time   NA 142 09/05/2020 0810   K 4.4 09/05/2020 0810   CL 104 09/05/2020 0810   CO2 25 09/05/2020 0810   GLUCOSE 86 09/05/2020 0810   GLUCOSE 302 (H) 08/26/2020 1137   BUN 17 09/05/2020 0810   CREATININE 1.22 (H) 09/05/2020 0810   CREATININE 1.14 (H) 12/15/2019 1048   CALCIUM 9.7 09/05/2020 0810   PROT 7.3 09/05/2020 0810   ALBUMIN 4.2 09/05/2020 0810   AST 26 09/05/2020 0810   ALT 29 09/05/2020 0810   ALKPHOS 134 (H) 09/05/2020 0810   BILITOT 0.4 09/05/2020 0810   GFRNONAA 42 (L) 09/05/2020 0810   GFRNONAA 45 (L) 08/26/2020 1137   GFRNONAA 45 (L) 12/15/2019 1048   GFRAA 48 (L) 09/05/2020 0810   GFRAA 53 (L) 12/15/2019 1048     Diabetic Labs (most recent): Lab Results  Component Value Date   HGBA1C 9.2 (A) 09/01/2020   HGBA1C 9.6 (A) 05/23/2020   HGBA1C 9.6 05/23/2020   HGBA1C 9.6 (A) 05/23/2020   HGBA1C 9.6 (A) 05/23/2020     Lipid Panel ( most recent) Lipid Panel      Component Value Date/Time   CHOL 156 09/05/2020 0810   TRIG 106 09/05/2020 0810   HDL 46 09/05/2020 0810   CHOLHDL 3.4 09/05/2020 0810   CHOLHDL 3.3 12/15/2019 1048   VLDL 21 03/22/2017 0803   LDLCALC 91 09/05/2020 0810   LDLCALC 80 12/15/2019 1048   LABVLDL 19 09/05/2020 0810      Lab Results  Component Value Date   TSH 4.360 05/23/2020   TSH 6.85 (H) 07/24/2019   TSH 5.19 (H) 08/04/2018   TSH 3.81 04/10/2016   TSH 2.410 12/13/2015   TSH 5.289 (H) 11/14/2015   TSH 3.963 06/14/2014   TSH 3.427 06/03/2013   TSH 3.333 01/22/2012   TSH 3.737 05/15/2010   FREET4 1.0 12/15/2019   FREET4 0.8 07/24/2019   FREET4 0.9 08/04/2018   FREET4 0.78 12/13/2015   FREET4 0.84 11/14/2015       Assessment & Plan:   1) Type 2 diabetes mellitus with stage 2 chronic kidney disease, with long-term current use of insulin (HCC)  - Kimberly Freeman has currently uncontrolled symptomatic type 2 DM since  79 years of age. Recent labs reviewed.  She presents today with her meter and logs showing adequate re-engagement in her care with significantly improved glycemic profile overall.  Her fasting glycemic profile is tightening and her postprandial glycemic profile is at goal.  She has changed her diet since last visit with A1c of 9.2%.  - I had a long discussion with her about the progressive nature of diabetes and the pathology behind its complications. -her diabetes is complicated by stage 2 CKD, obesity /sedentary life, and she remains at a high risk for more acute and chronic complications which include CAD, CVA, CKD, retinopathy, and neuropathy. These are all discussed in detail with her.  - Nutritional counseling repeated at each appointment due to patients tendency to fall back in to old habits.  - The patient admits there is a room for improvement in their diet and drink choices. -  Suggestion is made for the patient to avoid simple carbohydrates from their diet including Cakes, Sweet  Desserts / Pastries, Ice Cream, Soda (diet and regular), Sweet Tea, Candies, Chips, Cookies, Sweet Pastries,  Store Bought Juices, Alcohol in Excess of  1-2 drinks a  day, Artificial Sweeteners, Coffee Creamer, and "Sugar-free" Products. This will help patient to have stable blood glucose profile and potentially avoid unintended weight gain.   - I encouraged the patient to switch to  unprocessed or minimally processed complex starch and increased protein intake (animal or plant source), fruits, and vegetables.   - Patient is advised to stick to a routine mealtimes to eat 3 meals  a day and avoid unnecessary snacks ( to snack only to correct hypoglycemia).  - I have approached her with the following individualized plan to manage  her diabetes and patient agrees:   - she will continue to need at least basal insulin in order for her to achieve control of diabetes to target.   - Given her tight fasting glycemic profile, she is advised to lower her Basaglar to 45 units SQ nightly and to continue her Glipizide 2.5 mg XL daily with breakfast.  -I encouraged her to restart monitoring her glucose twice daily, before breakfast and before bed, and call the clinic if she has readings less than 70 or greater than 200 for 3 tests in a row.  -She will be considered for incretin therapy during her next visit.  - Specific targets for  A1c;  LDL, HDL,  and Triglycerides were discussed with the patient.  2) Blood Pressure /Hypertension:  Her blood pressure is controlled to target for her age.  She is advised to continue Norvasc 5 mg po daily, Irbesartan 300 mg po daily, Metoprolol 100 mg po twice daily, and Aldactone 50 mg po daily.  3) Lipids/Hyperlipidemia:    Her most recent lipid panel from 09/05/20 shows controlled LDL at 91.  She is advised to continue Atorvastatin 80 mg po daily at bedtime.  Side effects and precautions discussed with her.  4)  Weight/Diet:  Her Body mass index is 31.64 kg/m.  -    clearly complicating her diabetes care.   she is  a candidate for weight loss. I discussed with her the fact that loss of 5 - 10% of her  current body weight will have the most impact on her diabetes management.  Exercise, and detailed carbohydrates information provided  -  detailed on discharge instructions.  5) Chronic Care/Health Maintenance: -she  is on Statin medications and  is encouraged to initiate and continue to follow up with Ophthalmology, Dentist,  Podiatrist at least yearly or according to recommendations, and advised to   stay away from smoking. I have recommended yearly flu vaccine and pneumonia vaccine at least every 5 years; moderate intensity exercise for up to 150 minutes weekly; and  sleep for at least 7 hours a day.  - she is  advised to maintain close follow up with [name], MD for primary care needs, as well as her other providers for optimal and coordinated care.  - Time spent on this patient care encounter:  25 min, of which > 50% was spent in  counseling and the rest reviewing her blood glucose logs , discussing her hypoglycemia and hyperglycemia episodes, reviewing her current and  previous labs / studies  ( including abstraction from other facilities) and medications  doses and developing a  long term treatment plan and documenting her care.   Please refer to Patient Instructions for Blood Glucose Monitoring and Insulin/Medications Dosing Guide"  in media tab for additional information. Please  also refer to " Patient Self Inventory" in the Media  tab for reviewed elements of pertinent patient history.  Norvel Richards  Holwerda participated in the discussions, expressed understanding, and voiced agreement with the above plans.  All questions were answered to her satisfaction. she is encouraged to contact clinic should she have any questions or concerns prior to her return visit.   Follow up plan: - Return in about 3 months (around 12/19/2020) for Diabetes follow up with  A1c in office, No previsit labs, Bring glucometer and logs, ABI next visit.  Ronny Bacon, [facility] [address] [place], [place] [address] Phone: [phone] Fax: [phone]  09/20/2020, 9:11 AM

## 2020-09-20 NOTE — Progress Notes (Addendum)
  Medical Nutrition Therapy:  Appt start time: 0930 end time:  0945 Assessment:  Primary concerns today: Diabetes Type 2, Obesity. Lives with her husband who has beginning stages of dementia and CHF.  Follow up: Cut out sodas. No weight today. Machine is down.  FBS: 71-110 mg/dl. Bedtime:  180-190's Saw Whitney today, reduced basaglar to 45 units a day now, still takign 5 mg of Glipizide.  Using her exercise bike about 2 twice a week. 7 Day avg:        14 day avg 132 ng/dl, 14 day   avg 354 mg/dl and 30 day is 562 mg/dl.  Changes: Has been eating smaller portions.   Lab Results  Component Value Date   HGBA1C 9.2 (A) 09/01/2020    Preferred Learning Style:   No preference indicated   Learning Readiness:   Ready  Change in progress   MEDICATIONS:    DIETARY INTAKE:.    24-hr recall:  B (  9AM): Cherrios Medley-oat flakeswith milk, lactose milk, unsweet tea.  Snk ( AM):   L ( 1 PM): [place] leftovers [place] salad for dinner,  D ( 6/7 PM): string bean, rib, white potatoes, Unsweet tea Snk ( PM):  Beverages: water and unsweet tea  Usual physical activity: Has exercise bike  Estimated energy needs: 1200  calories 135 g carbohydrates 80 g protein 35 g fat  Progress Towards Goal(s):  In progress.   Nutritional Diagnosis:  NB-1.1 Food and nutrition-related knowledge deficit As related to Diabetes Type 2.  As evidenced by A1C 9.6%.    Intervention:  Nutrition and Diabetes education provided on My Plate, CHO counting, meal planning, portion sizes, timing of meals, avoiding snacks between meals unless having a low blood sugar, target ranges for A1C and blood sugars, signs/symptoms and treatment of hyper/hypoglycemia, monitoring blood sugars, taking medications as prescribed, benefits of exercising 30 minutes per day and prevention of complications of DM.  Goals  Goals Increase riding bike to 3 times per week. Keep working on water intake Prevent low blood sugars. Get  A1C down 7%.   Teaching Method Utilized:  Visual Auditory Hands on  Handouts given during visit include:  The Plate Method   Meal Plan Card    Barriers to learning/adherence to lifestyle change: none  Demonstrated degree of understanding via:  Teach Back   Monitoring/Evaluation:  Dietary intake, exercise, , and body weight in 3 month(s).

## 2020-09-27 ENCOUNTER — Encounter: Payer: Self-pay | Admitting: Family Medicine

## 2020-09-27 ENCOUNTER — Ambulatory Visit (INDEPENDENT_AMBULATORY_CARE_PROVIDER_SITE_OTHER): Payer: Medicare Other | Admitting: Family Medicine

## 2020-09-27 ENCOUNTER — Other Ambulatory Visit: Payer: Self-pay

## 2020-09-27 VITALS — BP 128/78 | HR 84 | Temp 98.0°F | Ht 67.0 in | Wt 201.0 lb

## 2020-09-27 DIAGNOSIS — Z0000 Encounter for general adult medical examination without abnormal findings: Secondary | ICD-10-CM

## 2020-09-27 DIAGNOSIS — E1142 Type 2 diabetes mellitus with diabetic polyneuropathy: Secondary | ICD-10-CM | POA: Diagnosis not present

## 2020-09-27 DIAGNOSIS — B351 Tinea unguium: Secondary | ICD-10-CM | POA: Diagnosis not present

## 2020-09-27 NOTE — Patient Instructions (Addendum)
[name] , Thank you for taking time to come for your Medicare Wellness Visit. I appreciate your ongoing commitment to your health goals. Please review the following plan we discussed and let me know if I can assist you in the future.   Safe travels to [facility]! Mery Christmas and Happy New Year :)  Screening recommendations/referrals: Colonoscopy: Complete Mammogram: Complete Bone Density: Complete Recommended yearly ophthalmology/optometry visit for glaucoma screening and checkup Recommended yearly dental visit for hygiene and checkup  Vaccinations: Influenza vaccine: Fall 2022 Pneumococcal vaccine: Complete Tdap vaccine: Due Shingles vaccine: Complete    Advanced directives: No  Conditions/risks identified: None  Next appointment: 12/08/20 @ 9:40 am   Preventive Care 65 Years and Older, Female Preventive care refers to lifestyle choices and visits with your health care provider that can promote health and wellness. What does preventive care include?  A yearly physical exam. This is also called an annual well check.  Dental exams once or twice a year.  Routine eye exams. Ask your health care provider how often you should have your eyes checked.  Personal lifestyle choices, including:  Daily care of your teeth and gums.  Regular physical activity.  Eating a healthy diet.  Avoiding tobacco and drug use.  Limiting alcohol use.  Practicing safe sex.  Taking low-dose aspirin every day.  Taking vitamin and mineral supplements as recommended by your health care provider. What happens during an annual well check? The services and screenings done by your health care provider during your annual well check will depend on your age, overall health, lifestyle risk factors, and family history of disease. Counseling  Your health care provider may ask you questions about your:  Alcohol use.  Tobacco use.  Drug use.  Emotional well-being.  Home and relationship  well-being.  Sexual activity.  Eating habits.  History of falls.  Memory and ability to understand (cognition).  Work and work Astronomer.  Reproductive health. Screening  You may have the following tests or measurements:  Height, weight, and BMI.  Blood pressure.  Lipid and cholesterol levels. These may be checked every 5 years, or more frequently if you are over 80 years old.  Skin check.  Lung cancer screening. You may have this screening every year starting at age 60 if you have a 30-pack-year history of smoking and currently smoke or have quit within the past 15 years.  Fecal occult blood test (FOBT) of the stool. You may have this test every year starting at age 66.  Flexible sigmoidoscopy or colonoscopy. You may have a sigmoidoscopy every 5 years or a colonoscopy every 10 years starting at age 81.  Hepatitis C blood test.  Hepatitis B blood test.  Sexually transmitted disease (STD) testing.  Diabetes screening. This is done by checking your blood sugar (glucose) after you have not eaten for a while (fasting). You may have this done every 1-3 years.  Bone density scan. This is done to screen for osteoporosis. You may have this done starting at age 42.  Mammogram. This may be done every 1-2 years. Talk to your health care provider about how often you should have regular mammograms. Talk with your health care provider about your test results, treatment options, and if necessary, the need for more tests. Vaccines  Your health care provider may recommend certain vaccines, such as:  Influenza vaccine. This is recommended every year.  Tetanus, diphtheria, and acellular pertussis (Tdap, Td) vaccine. You may need a Td booster every 10 years.  Zoster vaccine. You may need this after age 56.  Pneumococcal 13-valent conjugate (PCV13) vaccine. One dose is recommended after age 76.  Pneumococcal polysaccharide (PPSV23) vaccine. One dose is recommended after age  43. Talk to your health care provider about which screenings and vaccines you need and how often you need them. This information is not intended to replace advice given to you by your health care provider. Make sure you discuss any questions you have with your health care provider. Document Released: 11/04/2015 Document Revised: 06/27/2016 Document Reviewed: 08/09/2015 Elsevier Interactive Patient Education  2017 [employer] Prevention in the Home Falls can cause injuries. They can happen to people of all ages. There are many things you can do to make your home safe and to help prevent falls. What can I do on the outside of my home?  Regularly fix the edges of walkways and driveways and fix any cracks.  Remove anything that might make you trip as you walk through a door, such as a raised step or threshold.  Trim any bushes or trees on the path to your home.  Use bright outdoor lighting.  Clear any walking paths of anything that might make someone trip, such as rocks or tools.  Regularly check to see if handrails are loose or broken. Make sure that both sides of any steps have handrails.  Any raised decks and porches should have guardrails on the edges.  Have any leaves, snow, or ice cleared regularly.  Use sand or salt on walking paths during winter.  Clean up any spills in your garage right away. This includes oil or grease spills. What can I do in the bathroom?  Use night lights.  Install grab bars by the toilet and in the tub and shower. Do not use towel bars as grab bars.  Use non-skid mats or decals in the tub or shower.  If you need to sit down in the shower, use a plastic, non-slip stool.  Keep the floor dry. Clean up any water that spills on the floor as soon as it happens.  Remove soap buildup in the tub or shower regularly.  Attach bath mats securely with double-sided non-slip rug tape.  Do not have throw rugs and other things on the floor that can make  you trip. What can I do in the bedroom?  Use night lights.  Make sure that you have a light by your bed that is easy to reach.  Do not use any sheets or blankets that are too big for your bed. They should not hang down onto the floor.  Have a firm chair that has side arms. You can use this for support while you get dressed.  Do not have throw rugs and other things on the floor that can make you trip. What can I do in the kitchen?  Clean up any spills right away.  Avoid walking on wet floors.  Keep items that you use a lot in easy-to-reach places.  If you need to reach something above you, use a strong step stool that has a grab bar.  Keep electrical cords out of the way.  Do not use floor polish or wax that makes floors slippery. If you must use wax, use non-skid floor wax.  Do not have throw rugs and other things on the floor that can make you trip. What can I do with my stairs?  Do not leave any items on the stairs.  Make sure that there are  handrails on both sides of the stairs and use them. Fix handrails that are broken or loose. Make sure that handrails are as long as the stairways.  Check any carpeting to make sure that it is firmly attached to the stairs. Fix any carpet that is loose or worn.  Avoid having throw rugs at the top or bottom of the stairs. If you do have throw rugs, attach them to the floor with carpet tape.  Make sure that you have a light switch at the top of the stairs and the bottom of the stairs. If you do not have them, ask someone to add them for you. What else can I do to help prevent falls?  Wear shoes that:  Do not have high heels.  Have rubber bottoms.  Are comfortable and fit you well.  Are closed at the toe. Do not wear sandals.  If you use a stepladder:  Make sure that it is fully opened. Do not climb a closed stepladder.  Make sure that both sides of the stepladder are locked into place.  Ask someone to hold it for you, if  possible.  Clearly mark and make sure that you can see:  Any grab bars or handrails.  First and last steps.  Where the edge of each step is.  Use tools that help you move around (mobility aids) if they are needed. These include:  Canes.  Walkers.  Scooters.  Crutches.  Turn on the lights when you go into a dark area. Replace any light bulbs as soon as they burn out.  Set up your furniture so you have a clear path. Avoid moving your furniture around.  If any of your floors are uneven, fix them.  If there are any pets around you, be aware of where they are.  Review your medicines with your doctor. Some medicines can make you feel dizzy. This can increase your chance of falling. Ask your doctor what other things that you can do to help prevent falls. This information is not intended to replace advice given to you by your health care provider. Make sure you discuss any questions you have with your health care provider. Document Released: 08/04/2009 Document Revised: 03/15/2016 Document Reviewed: 11/12/2014 Elsevier Interactive Patient Education  2017 [employer].

## 2020-09-27 NOTE — Progress Notes (Signed)
Subjective:   Kimberly Freeman is a 79 y.o. female who presents for Medicare Annual (Subsequent) preventive examination.  Review of Systems Cardiac Risk Factors include: advanced age (>64men, >6 women);diabetes mellitus;obesity (BMI >30kg/m2)     Objective:    Today's Vitals   09/27/20 0933  BP: 128/78  Pulse: 84  Temp: 98 F (36.7 C)  TempSrc: Temporal  SpO2: 96%  Weight: 201 lb (91.2 kg)  Height: 5\' 7"  (1.702 m)  PainSc: 0-No pain   Body mass index is 31.48 kg/m.  Advanced Directives 09/27/2020 09/02/2020 02/26/2020 09/21/2019 09/20/2019 08/20/2019 12/08/2018  Does Patient Have a Medical Advance Directive? No No No No No No No  Type of Advance Directive - - - - - - -  Does patient want to make changes to medical advance directive? - - - - - - -  Copy of Healthcare Power of Attorney in Chart? - - - - - - -  Would patient like information on creating a medical advance directive? No - Patient declined No - Patient declined No - Patient declined No - Patient declined - No - Patient declined -    Current Medications (verified) Outpatient Encounter Medications as of 09/27/2020  Medication Sig  . acetaminophen (TYLENOL) 500 MG tablet Take 500 mg by mouth daily as needed for mild pain or moderate pain. For pain   . amLODipine (NORVASC) 2.5 MG tablet Take 2.5 mg by mouth every evening.  14/04/2020 amLODipine (NORVASC) 5 MG tablet Take 5 mg by mouth every morning.  Marland Kitchen aspirin 81 MG chewable tablet Chew 1 tablet (81 mg total) by mouth daily.  Marland Kitchen atorvastatin (LIPITOR) 80 MG tablet Take 1 tablet (80 mg total) by mouth daily at 6 PM.  . betamethasone dipropionate 0.05 % cream Apply topically 2 (two) times daily.  . calcium-vitamin D (OSCAL WITH D) 500-200 MG-UNIT tablet Take 1 tablet by mouth.  Marland Kitchen glipiZIDE (GLIPIZIDE XL) 2.5 MG 24 hr tablet Take 1 tablet (2.5 mg total) by mouth daily with breakfast.  . glucose blood (ONE TOUCH ULTRA TEST) test strip USE AS INSTRUCTED FOR TWICE DAILY TESTING  .  Insulin Glargine (BASAGLAR KWIKPEN) 100 UNIT/ML Inject 45 Units into the skin at bedtime.  . Insulin Pen Needle (BD PEN NEEDLE MICRO U/F) 32G X 6 MM MISC USE TO INJECT INSULIN DAILY  . irbesartan (AVAPRO) 300 MG tablet TAKE 1 TABLET DAILY  . Lancets (ONETOUCH ULTRASOFT) lancets USE AS DIRECTED TWICE DAILY  . metoprolol tartrate (LOPRESSOR) 100 MG tablet TAKE 1 TABLET TWICE A DAY  . Multiple Vitamin (MULTIVITAMIN WITH MINERALS) TABS tablet Take 1 tablet by mouth daily.  . pantoprazole (PROTONIX) 40 MG tablet TAKE 1 TABLET BY MOUTH 30 MINUTES BEFORE FIRST MEAL  . polyethylene glycol (MIRALAX / GLYCOLAX) 17 g packet Take 17 g by mouth daily as needed.  Marland Kitchen spironolactone (ALDACTONE) 50 MG tablet Take 1 tablet (50 mg total) by mouth daily.  . vitamin B-12 (CYANOCOBALAMIN) 500 MCG tablet Take 500 mcg by mouth daily.   No facility-administered encounter medications on file as of 09/27/2020.    Allergies (verified) Poison oak extract [poison oak extract] and Sulfonamide derivatives   History: Past Medical History:  Diagnosis Date  . Arthritis   . CAD (coronary artery disease) 08/2019   60% RI with other 20-30% lesions at cath  . Diabetes mellitus, type 2 (HCC)   . Essential hypertension   . GERD (gastroesophageal reflux disease)   . Hyperlipidemia    Past  Surgical History:  Procedure Laterality Date  . ABDOMINAL HYSTERECTOMY    . BIOPSY  07/18/2017   Procedure: BIOPSY;  Surgeon: [name], MD;  Location: [facility];  Service: Endoscopy;;  gastric  . CHOLECYSTECTOMY  2004  . COLONOSCOPY N/A 07/18/2017   Dr. [name]: Internal and external hemorrhoids.  No future screening/surveillance colonoscopies due to age.  . ESOPHAGOGASTRODUODENOSCOPY N/A 07/18/2017   Dr. [name]: Small hiatal hernia, patchy mild inflammation characterized by congestion, erosions, erythema in the cardia, gastric body and antrum.  Diffuse moderate inflammation in the duodenal bulb and second portion duodenum.   Biopsies benign.  No H. pylori.  Marland Kitchen LEFT HEART CATH AND CORONARY ANGIOGRAPHY N/A 09/21/2019   Procedure: LEFT HEART CATH AND CORONARY ANGIOGRAPHY;  Surgeon: [name], MD;  Location: [facility];  Service: Cardiovascular;  Laterality: N/A;   Family History  Problem Relation Age of Onset  . Leukemia Mother   . Cancer Mother   . Diabetes Mother   . Prostate cancer Father   . Lung cancer Sister 2  . Diabetes Sister   . Colon cancer Neg Hx    Social History   Socioeconomic History  . Marital status: Married    Spouse name: [name]   . Number of children: 0  . Years of education: 12+  . Highest education level: 12th grade  Occupational History  . Occupation: retired   Tobacco Use  . Smoking status: Never Smoker  . Smokeless tobacco: Never Used  Vaping Use  . Vaping Use: Never used  Substance and Sexual Activity  . Alcohol use: No    Alcohol/week: 0.0 standard drinks  . Drug use: No  . Sexual activity: Not Currently    Partners: Male  Other Topics Concern  . Not on file  Social History Narrative   Lives alone with husband    Social Determinants of Health   Financial Resource Strain: Low Risk   . Difficulty of Paying Living Expenses: Not hard at all  Food Insecurity: No Food Insecurity  . Worried About Programme researcher, broadcasting/film/video in the Last Year: Never true  . Ran Out of Food in the Last Year: Never true  Transportation Needs: No Transportation Needs  . Lack of Transportation (Medical): No  . Lack of Transportation (Non-Medical): No  Physical Activity: Insufficiently Active  . Days of Exercise per Week: 3 days  . Minutes of Exercise per Session: 30 min  Stress: No Stress Concern Present  . Feeling of Stress : Not at all  Social Connections: Socially Integrated  . Frequency of Communication with Friends and Family: More than three times a week  . Frequency of Social Gatherings with Friends and Family: More than three times a week  . Attends Religious  Services: More than 4 times per year  . Active Member of Clubs or Organizations: Yes  . Attends Banker Meetings: More than 4 times per year  . Marital Status: Married    Tobacco Counseling Counseling given: Yes   Clinical Intake:  Pre-visit preparation completed: Yes  Pain : No/denies pain Pain Score: 0-No pain     BMI - recorded: 31.64 Nutritional Status: BMI > 30  Obese Nutritional Risks: None Diabetes: Yes CBG done?: No Did pt. bring in CBG monitor from home?: No  How often do you need to have someone help you when you read instructions, pamphlets, or other written materials from your doctor or pharmacy?: 1 - Never What is the last  grade level you completed in school?: [employer] 4 years  Diabetic? yes  Interpreter Needed?: No  Information entered by :: Jerilynn Mages, LPN   Activities of Daily Living In your present state of health, do you have any difficulty performing the following activities: 09/27/2020  Hearing? N  Vision? N  Difficulty concentrating or making decisions? N  Walking or climbing stairs? N  Dressing or bathing? N  Doing errands, shopping? N  Preparing Food and eating ? N  Using the Toilet? N  In the past six months, have you accidently leaked urine? N  Do you have problems with loss of bowel control? N  Managing your Medications? N  Managing your Finances? N  Housekeeping or managing your Housekeeping? N  Some recent data might be hidden    Patient Care Team: [name], MD as PCP - General Laqueta Linden, MD (Inactive) as PCP - Cardiology (Cardiology) Laqueta Linden, MD (Inactive) as Attending Physician (Cardiology) Ferman Hamming, DPM as Consulting Physician (Podiatry) [name], Darleene Cleaver, MD (Inactive) as Consulting Physician (Gastroenterology)  Indicate any recent Medical Services you may have received from other than [facility] providers in the past year (date may be approximate).     Assessment:    This is a routine wellness examination for Fahima.  Hearing/Vision screen No exam data present  Dietary issues and exercise activities discussed: Current Exercise Habits: Home exercise routine, Type of exercise: walking, Time (Minutes): 30, Frequency (Times/Week): 3, Weekly Exercise (Minutes/Week): 90, Intensity: Mild  Goals    . DIET - REDUCE SUGAR INTAKE    . Exercise 5x per week (60 min per time)     Starting 10/04/2016 patient would like to start back on her exercise bicycle 5 times a week for 60 minutes at a time.      Depression Screen PHQ 2/9 Scores 09/27/2020 09/27/2020 09/02/2020 07/26/2020 05/23/2020 03/31/2020 12/01/2019  PHQ - 2 Score 0 0 0 0 0 0 0  PHQ- 9 Score - - - - - 0 -    Fall Risk Fall Risk  09/27/2020 09/06/2020 07/26/2020 07/12/2020 05/23/2020  Falls in the past year? 0 0 0 0 0  Number falls in past yr: 0 0 0 0 0  Injury with Fall? 0 0 0 0 0  Risk for fall due to : No Fall Risks - - - No Fall Risks  Follow up Falls evaluation completed - - - Falls evaluation completed    FALL RISK PREVENTION PERTAINING TO THE HOME:  Any stairs in or around the home? Yes  If so, are there any without handrails? No  Home free of loose throw rugs in walkways, pet beds, electrical cords, etc? Yes  Adequate lighting in your home to reduce risk of falls? Yes   ASSISTIVE DEVICES UTILIZED TO PREVENT FALLS:  Life alert? No  Use of a cane, walker or w/c? No  Grab bars in the bathroom? No  Shower chair or bench in shower? No  Elevated toilet seat or a handicapped toilet? No   TIMED UP AND GO:  Was the test performed? No .  Length of time to ambulate n/a   Cognitive Function:     6CIT Screen 09/27/2020 09/24/2019 09/22/2018 09/18/2017 10/04/2016  What Year? 0 points 0 points 0 points 0 points 0 points  What month? 0 points 0 points 0 points 0 points 0 points  What time? 0 points 0 points 0 points 0 points 0 points  Count back from 20 0  points 0 points 0 points 0 points 0 points    Months in reverse 0 points 0 points 0 points 0 points 0 points  Repeat phrase 2 points 0 points 2 points 0 points 0 points  Total Score 2 0 2 0 0    Immunizations Immunization History  Administered Date(s) Administered  . Fluad Quad(high Dose 65+) 07/27/2019, 06/23/2020  . Influenza Split 08/01/2011, 07/31/2012  . Influenza Whole 07/29/2007, 07/20/2008, 07/21/2009, 07/19/2010  . Influenza, High Dose Seasonal PF 07/23/2018  . Influenza,inj,Quad PF,6+ Mos 08/07/2013, 06/15/2014, 07/19/2015, 06/28/2016, 07/25/2017  . Moderna SARS-COVID-2 Vaccination 12/22/2019, 01/19/2020  . Pneumococcal Conjugate-13 10/12/2014  . Pneumococcal Polysaccharide-23 03/16/2004, 08/29/2012  . Td 03/16/2004  . Tdap 03/31/2012  . Zoster 01/02/2007    TDAP status: Up to date  Flu Vaccine status: Up to date  Pneumococcal vaccine status: Up to date  Covid-19 vaccine status: Completed vaccines  Qualifies for Shingles Vaccine? Yes   Zostavax completed No   Shingrix Completed?: No.    Education has been provided regarding the importance of this vaccine. Patient has been advised to call insurance company to determine out of pocket expense if they have not yet received this vaccine. Advised may also receive vaccine at local pharmacy or Health Dept. Verbalized acceptance and understanding.  Screening Tests Health Maintenance  Topic Date Due  . FOOT EXAM  12/19/2020  . HEMOGLOBIN A1C  03/01/2021  . OPHTHALMOLOGY EXAM  03/01/2021  . TETANUS/TDAP  03/31/2022  . INFLUENZA VACCINE  Completed  . DEXA SCAN  Completed  . COVID-19 Vaccine  Completed  . PNA vac Low Risk Adult  Completed    Health Maintenance  There are no preventive care reminders to display for this patient.  Colorectal cancer screening: Type of screening: Colonoscopy. Completed 07/18/17. Repeat every 10 years  Mammogram status: Completed 08/22/20. Repeat every year  Bone Density status: Completed 08/14/18. Results reflect: Bone density  results: NORMAL. Repeat every 5 years.  Lung Cancer Screening: (Low Dose CT Chest recommended if Age 30-80 years, 30 pack-year currently smoking OR have quit w/in 15years.) does not qualify.   Lung Cancer Screening Referral: n/a  Additional Screening:  Hepatitis C Screening: does not qualify  Vision Screening: Recommended annual ophthalmology exams for early detection of glaucoma and other disorders of the eye. Is the patient up to date with their annual eye exam?  Yes  Who is the provider or what is the name of the office in which the patient attends annual eye exams? [facility] in [place] If pt is not established with a provider, would they like to be referred to a provider to establish care? n/a   Dental Screening: Recommended annual dental exams for proper oral hygiene  Community Resource Referral / Chronic Care Management: CRR required this visit?  No   CCM required this visit?  No      Plan:      1. Encounter for Medicare annual wellness exam   I have personally reviewed and noted the following in the patient's chart:   . Medical and social history . Use of alcohol, tobacco or illicit drugs  . Current medications and supplements . Functional ability and status . Nutritional status . Physical activity . Advanced directives . List of other physicians . Hospitalizations, surgeries, and ER visits in previous 12 months . Vitals . Screenings to include cognitive, depression, and falls . Referrals and appointments  In addition, I have reviewed and discussed with patient certain preventive protocols, quality metrics,  and best practice recommendations. A written personalized care plan for preventive services as well as general preventive health recommendations were provided to patient.     Freddy FinnerHannah M Hartleigh Edmonston, NP   09/27/2020   Nurse Notes: AWV conducted by nurse in office with patient consent. Patient and provider both in the office as well. Visit took 25 minutes to  complete.    Patient in office today for visit. Doing well. No additional concerns or questions. Agree with the above documentation.

## 2020-10-04 ENCOUNTER — Encounter: Payer: Self-pay | Admitting: Orthopaedic Surgery

## 2020-10-04 ENCOUNTER — Ambulatory Visit (INDEPENDENT_AMBULATORY_CARE_PROVIDER_SITE_OTHER): Payer: Medicare Other | Admitting: Orthopaedic Surgery

## 2020-10-04 ENCOUNTER — Other Ambulatory Visit: Payer: Self-pay

## 2020-10-04 VITALS — Ht 67.0 in | Wt 200.0 lb

## 2020-10-04 DIAGNOSIS — G8929 Other chronic pain: Secondary | ICD-10-CM | POA: Diagnosis not present

## 2020-10-04 DIAGNOSIS — M25562 Pain in left knee: Secondary | ICD-10-CM

## 2020-10-04 NOTE — Progress Notes (Signed)
PROCEDURE NOTE:  The patient requests injections of the left knee , verbal consent was obtained.  The left knee was prepped appropriately after time out was performed.   Sterile technique was observed and injection of 1 cc of Depo-Medrol 40 mg with several cc's of plain xylocaine. Anesthesia was provided by ethyl chloride and a 20-gauge needle was used to inject the knee area. The injection was tolerated well.  A band aid dressing was applied.  The patient was advised to apply ice later today and tomorrow to the injection sight as needed.  See as needed.  Electronically Signed Darreld Mclean, MD 12/14/20219:14 AM

## 2020-11-19 ENCOUNTER — Other Ambulatory Visit: Payer: Self-pay | Admitting: "Endocrinology

## 2020-12-05 ENCOUNTER — Other Ambulatory Visit: Payer: Self-pay

## 2020-12-05 MED ORDER — IRBESARTAN 300 MG PO TABS
300.0000 mg | ORAL_TABLET | Freq: Every day | ORAL | 3 refills | Status: DC
Start: 2020-12-05 — End: 2021-12-19

## 2020-12-05 MED ORDER — SPIRONOLACTONE 50 MG PO TABS
50.0000 mg | ORAL_TABLET | Freq: Every day | ORAL | 3 refills | Status: DC
Start: 2020-12-05 — End: 2021-11-09

## 2020-12-05 MED ORDER — PANTOPRAZOLE SODIUM 40 MG PO TBEC
DELAYED_RELEASE_TABLET | ORAL | 3 refills | Status: DC
Start: 2020-12-05 — End: 2021-11-30

## 2020-12-06 DIAGNOSIS — B351 Tinea unguium: Secondary | ICD-10-CM | POA: Diagnosis not present

## 2020-12-06 DIAGNOSIS — E1142 Type 2 diabetes mellitus with diabetic polyneuropathy: Secondary | ICD-10-CM | POA: Diagnosis not present

## 2020-12-08 ENCOUNTER — Other Ambulatory Visit: Payer: Self-pay

## 2020-12-08 ENCOUNTER — Telehealth: Payer: Medicare Other | Admitting: Family Medicine

## 2020-12-08 ENCOUNTER — Encounter: Payer: Self-pay | Admitting: Family Medicine

## 2020-12-08 VITALS — BP 137/78 | Ht 67.0 in | Wt 196.0 lb

## 2020-12-08 DIAGNOSIS — N182 Chronic kidney disease, stage 2 (mild): Secondary | ICD-10-CM | POA: Diagnosis not present

## 2020-12-08 DIAGNOSIS — Z0100 Encounter for examination of eyes and vision without abnormal findings: Secondary | ICD-10-CM | POA: Diagnosis not present

## 2020-12-08 DIAGNOSIS — E669 Obesity, unspecified: Secondary | ICD-10-CM

## 2020-12-08 DIAGNOSIS — I10 Essential (primary) hypertension: Secondary | ICD-10-CM

## 2020-12-08 DIAGNOSIS — E785 Hyperlipidemia, unspecified: Secondary | ICD-10-CM

## 2020-12-08 DIAGNOSIS — E119 Type 2 diabetes mellitus without complications: Secondary | ICD-10-CM

## 2020-12-08 DIAGNOSIS — I129 Hypertensive chronic kidney disease with stage 1 through stage 4 chronic kidney disease, or unspecified chronic kidney disease: Secondary | ICD-10-CM

## 2020-12-08 DIAGNOSIS — E1122 Type 2 diabetes mellitus with diabetic chronic kidney disease: Secondary | ICD-10-CM

## 2020-12-08 DIAGNOSIS — K219 Gastro-esophageal reflux disease without esophagitis: Secondary | ICD-10-CM

## 2020-12-08 DIAGNOSIS — I480 Paroxysmal atrial fibrillation: Secondary | ICD-10-CM | POA: Diagnosis not present

## 2020-12-08 DIAGNOSIS — Z135 Encounter for screening for eye and ear disorders: Secondary | ICD-10-CM

## 2020-12-08 DIAGNOSIS — Z794 Long term (current) use of insulin: Secondary | ICD-10-CM

## 2020-12-08 NOTE — Patient Instructions (Addendum)
F/U in early September, with flu vaccine, call if you need me before  Non fasting chem 7 and EGFR Feb 28   Congrats on improved blood sugar   Continue to exercise regularly  You are referred for diabetic eye exam , Dr Jorja Loa  It is important that you exercise regularly at least 30 minutes 5 times a week. If you develop chest pain, have severe difficulty breathing, or feel very tired, stop exercising immediately and seek medical attention  Think about what you will eat, plan ahead. Choose " clean, green, fresh or frozen" over canned, processed or packaged foods which are more sugary, salty and fatty. 70 to 75% of food eaten should be vegetables and fruit. Three meals at set times with snacks allowed between meals, but they must be fruit or vegetables. Aim to eat over a 12 hour period , example 7 am to 7 pm, and STOP after  your last meal of the day. Drink water,generally about 64 ounces per day, no other drink is as healthy. Fruit juice is best enjoyed in a healthy way, by EATING the fruit. Thanks for choosing [facility], we consider it a privelige to serve you.

## 2020-12-08 NOTE — Progress Notes (Signed)
Virtual Visit via Telephone Note  I connected with Kimberly Freeman on 12/08/20 at  9:40 AM EST by telephone and verified that I am speaking with the correct person using two identifiers.  Location: Patient: home Provider: office   I discussed the limitations, risks, security and privacy concerns of performing an evaluation and management service by telephone and the availability of in person appointments. I also discussed with the patient that there may be a patient responsible charge related to this service. The patient expressed understanding and agreed to proceed.   History of Present Illness: F/U chronic problems and address any new or current concerns. Review and update medications and allergies. Review recent lab and radiologic data . Update routine health maintainace. Review an encourage improved health habits to include nutrition, exercise and  sleep .  Denies recent fever or chills. Denies sinus pressure, nasal congestion, ear pain or sore throat. Denies chest congestion, productive cough or wheezing. Denies chest pains, palpitations and leg swelling Denies abdominal pain, nausea, vomiting,diarrhea or constipation.   Denies dysuria, frequency, hesitancy or incontinence. Denies joint pain, swelling and limitation in mobility. Denies headaches, seizures, numbness, or tingling. Denies depression, anxiety or insomnia. Denies skin break down or rash. Blood sugar is improving Denies polyuria, polydipsia, blurred vision , or hypoglycemic episodes.       Observations/Objective: BP 137/78   Ht 5\' 7"  (1.702 m)   Wt 196 lb (88.9 kg)   BMI 30.70 kg/m  Good communication with no confusion and intact memory. Alert and oriented x 3 No signs of respiratory distress during speech    Assessment and Plan:  Type 2 diabetes mellitus with stage 2 chronic kidney disease, with long-term current use of insulin (HCC) Kimberly Freeman is reminded of the importance of commitment to daily  physical activity for 30 minutes or more, as able and the need to limit carbohydrate intake to 30 to 60 grams per meal to help with blood sugar control.   The need to take medication as prescribed, test blood sugar as directed, and to call between visits if there is a concern that blood sugar is uncontrolled is also discussed.   Kimberly Freeman is reminded of the importance of daily foot exam, annual eye examination, and good blood sugar, blood pressure and cholesterol control.  Diabetic Labs Latest Ref Rng & Units 09/05/2020 09/01/2020 08/26/2020 05/23/2020 05/23/2020  HbA1c 4.0 - 5.6 % - 9.2(A) - 9.6(A) 9.6(A)  Microalbumin mg/dL - - - - -  Micro/Creat Ratio <30 mcg/mg creat - - - - -  Chol 100 - 199 mg/dL 07/23/2020 - - - -  HDL 096 mg/dL 46 - - - -  Calc LDL 0 - 99 mg/dL 91 - - - -  Triglycerides 0 - 149 mg/dL >28 - - - -  Creatinine 0.57 - 1.00 mg/dL 366) - 2.94(T) 6.54(Y) -   BP/Weight 12/08/2020 10/04/2020 09/27/2020 09/20/2020 09/07/2020 09/06/2020 09/02/2020  Systolic BP 137 - 128 146 128 13/09/2020 157  Diastolic BP 78 - 78 75 68 81 92  Wt. (Lbs) 196 200 201 202 202.2 203 203.4  BMI 30.7 31.32 31.48 31.64 31.67 31.79 31.86   Foot/eye exam completion dates Latest Ref Rng & Units 03/01/2020 12/15/2019  Eye Exam No Retinopathy No Retinopathy -  Foot exam Order - - -  Foot Form Completion - - Done  reports improvement , has upcoming Endo appt      Essential hypertension DASH diet and commitment to daily physical activity for a  minimum of 30 minutes discussed and encouraged, as a part of hypertension management. The importance of attaining a healthy weight is also discussed.  BP/Weight 12/08/2020 10/04/2020 09/27/2020 09/20/2020 09/07/2020 09/06/2020 09/02/2020  Systolic BP 137 - 128 146 128 809 157  Diastolic BP 78 - 78 75 68 81 92  Wt. (Lbs) 196 200 201 202 202.2 203 203.4  BMI 30.7 31.32 31.48 31.64 31.67 31.79 31.86       Dyslipidemia, goal LDL below 70 Hyperlipidemia:Low fat diet  discussed and encouraged.   Lipid Panel  Lab Results  Component Value Date   CHOL 156 09/05/2020   HDL 46 09/05/2020   LDLCALC 91 09/05/2020   TRIG 106 09/05/2020   CHOLHDL 3.4 09/05/2020  Controlled, no change in medication      CAD (coronary artery disease) No c/o  Paroxysmal atrial fibrillation (HCC) Rate controlled on medication  Obesity (BMI 30-39.9)  Patient re-educated about  the importance of commitment to a  minimum of 150 minutes of exercise per week as able.  The importance of healthy food choices with portion control discussed, as well as eating regularly and within a 12 hour window most days. The need to choose "clean , green" food 50 to 75% of the time is discussed, as well as to make water the primary drink and set a goal of 64 ounces water daily.    Weight /BMI 12/08/2020 10/04/2020 09/27/2020  WEIGHT 196 lb 200 lb 201 lb  HEIGHT 5\' 7"  5\' 7"  5\' 7"   BMI 30.7 kg/m2 31.32 kg/m2 31.48 kg/m2      Esophageal reflux Controlled, no change in medication    Follow Up Instructions:    I discussed the assessment and treatment plan with the patient. The patient was provided an opportunity to ask questions and all were answered. The patient agreed with the plan and demonstrated an understanding of the instructions.   The patient was advised to call back or seek an in-person evaluation if the symptoms worsen or if the condition fails to improve as anticipated.  I provided 15 minutes of non-face-to-face time during this encounter.   , MD

## 2020-12-11 ENCOUNTER — Encounter: Payer: Self-pay | Admitting: Family Medicine

## 2020-12-11 NOTE — Assessment & Plan Note (Signed)
No c/o

## 2020-12-11 NOTE — Assessment & Plan Note (Signed)
Rate controlled on medication 

## 2020-12-11 NOTE — Assessment & Plan Note (Signed)
Hyperlipidemia:Low fat diet discussed and encouraged.   Lipid Panel  Lab Results  Component Value Date   CHOL 156 09/05/2020   HDL 46 09/05/2020   LDLCALC 91 09/05/2020   TRIG 106 09/05/2020   CHOLHDL 3.4 09/05/2020  Controlled, no change in medication

## 2020-12-11 NOTE — Assessment & Plan Note (Signed)
Controlled, no change in medication  

## 2020-12-11 NOTE — Assessment & Plan Note (Signed)
Kimberly Freeman is reminded of the importance of commitment to daily physical activity for 30 minutes or more, as able and the need to limit carbohydrate intake to 30 to 60 grams per meal to help with blood sugar control.   The need to take medication as prescribed, test blood sugar as directed, and to call between visits if there is a concern that blood sugar is uncontrolled is also discussed.   Kimberly Freeman is reminded of the importance of daily foot exam, annual eye examination, and good blood sugar, blood pressure and cholesterol control.  Diabetic Labs Latest Ref Rng & Units 09/05/2020 09/01/2020 08/26/2020 05/23/2020 05/23/2020  HbA1c 4.0 - 5.6 % - 9.2(A) - 9.6(A) 9.6(A)  Microalbumin mg/dL - - - - -  Micro/Creat Ratio <30 mcg/mg creat - - - - -  Chol 100 - 199 mg/dL 854 - - - -  HDL >62 mg/dL 46 - - - -  Calc LDL 0 - 99 mg/dL 91 - - - -  Triglycerides 0 - 149 mg/dL 703 - - - -  Creatinine 0.57 - 1.00 mg/dL 5.00(X) - 3.81(W) 2.99(B) -   BP/Weight 12/08/2020 10/04/2020 09/27/2020 09/20/2020 09/07/2020 09/06/2020 09/02/2020  Systolic BP 137 - 128 146 128 716 157  Diastolic BP 78 - 78 75 68 81 92  Wt. (Lbs) 196 200 201 202 202.2 203 203.4  BMI 30.7 31.32 31.48 31.64 31.67 31.79 31.86   Foot/eye exam completion dates Latest Ref Rng & Units 03/01/2020 12/15/2019  Eye Exam No Retinopathy No Retinopathy -  Foot exam Order - - -  Foot Form Completion - - Done  reports improvement , has upcoming Endo appt

## 2020-12-11 NOTE — Assessment & Plan Note (Signed)
  Patient re-educated about  the importance of commitment to a  minimum of 150 minutes of exercise per week as able.  The importance of healthy food choices with portion control discussed, as well as eating regularly and within a 12 hour window most days. The need to choose "clean , green" food 50 to 75% of the time is discussed, as well as to make water the primary drink and set a goal of 64 ounces water daily.    Weight /BMI 12/08/2020 10/04/2020 09/27/2020  WEIGHT 196 lb 200 lb 201 lb  HEIGHT 5\' 7"  5\' 7"  5\' 7"   BMI 30.7 kg/m2 31.32 kg/m2 31.48 kg/m2

## 2020-12-11 NOTE — Assessment & Plan Note (Signed)
DASH diet and commitment to daily physical activity for a minimum of 30 minutes discussed and encouraged, as a part of hypertension management. The importance of attaining a healthy weight is also discussed.  BP/Weight 12/08/2020 10/04/2020 09/27/2020 09/20/2020 09/07/2020 09/06/2020 09/02/2020  Systolic BP 137 - 128 146 128 861 157  Diastolic BP 78 - 78 75 68 81 92  Wt. (Lbs) 196 200 201 202 202.2 203 203.4  BMI 30.7 31.32 31.48 31.64 31.67 31.79 31.86

## 2020-12-19 ENCOUNTER — Other Ambulatory Visit: Payer: Self-pay

## 2020-12-19 ENCOUNTER — Ambulatory Visit (INDEPENDENT_AMBULATORY_CARE_PROVIDER_SITE_OTHER): Payer: Medicare Other

## 2020-12-19 ENCOUNTER — Telehealth: Payer: Self-pay

## 2020-12-19 ENCOUNTER — Other Ambulatory Visit: Payer: Self-pay | Admitting: Family Medicine

## 2020-12-19 DIAGNOSIS — N182 Chronic kidney disease, stage 2 (mild): Secondary | ICD-10-CM | POA: Diagnosis not present

## 2020-12-19 DIAGNOSIS — R350 Frequency of micturition: Secondary | ICD-10-CM

## 2020-12-19 DIAGNOSIS — I10 Essential (primary) hypertension: Secondary | ICD-10-CM | POA: Diagnosis not present

## 2020-12-19 DIAGNOSIS — E1122 Type 2 diabetes mellitus with diabetic chronic kidney disease: Secondary | ICD-10-CM | POA: Diagnosis not present

## 2020-12-19 DIAGNOSIS — Z794 Long term (current) use of insulin: Secondary | ICD-10-CM | POA: Diagnosis not present

## 2020-12-19 LAB — POCT URINALYSIS DIP (CLINITEK)
Bilirubin, UA: NEGATIVE
Blood, UA: NEGATIVE
Glucose, UA: NEGATIVE mg/dL
Ketones, POC UA: NEGATIVE mg/dL
Leukocytes, UA: NEGATIVE
Nitrite, UA: NEGATIVE
POC PROTEIN,UA: NEGATIVE
Spec Grav, UA: 1.015 (ref 1.010–1.025)
Urobilinogen, UA: 0.2 E.U./dL
pH, UA: 5 (ref 5.0–8.0)

## 2020-12-19 NOTE — Telephone Encounter (Signed)
I can't check her urine without a ov. Needs appt scheduled first- can be virtual- and then can leave urine when she comes

## 2020-12-19 NOTE — Telephone Encounter (Signed)
Patient coming in for labs today and is requesting a urine be added to the order she states she has been urinating more frequently ph# [phone]

## 2020-12-20 ENCOUNTER — Telehealth: Payer: Medicare Other | Admitting: Family Medicine

## 2020-12-20 ENCOUNTER — Other Ambulatory Visit: Payer: Self-pay

## 2020-12-20 LAB — BASIC METABOLIC PANEL
BUN/Creatinine Ratio: 20 (ref 12–28)
BUN: 28 mg/dL — ABNORMAL HIGH (ref 8–27)
CO2: 22 mmol/L (ref 20–29)
Calcium: 9.6 mg/dL (ref 8.7–10.3)
Chloride: 102 mmol/L (ref 96–106)
Creatinine, Ser: 1.38 mg/dL — ABNORMAL HIGH (ref 0.57–1.00)
Glucose: 275 mg/dL — ABNORMAL HIGH (ref 65–99)
Potassium: 4.6 mmol/L (ref 3.5–5.2)
Sodium: 141 mmol/L (ref 134–144)
eGFR: 38 mL/min/{1.73_m2} — ABNORMAL LOW (ref >59)

## 2020-12-21 ENCOUNTER — Encounter: Payer: Medicare Other | Attending: "Endocrinology | Admitting: Nutrition

## 2020-12-21 ENCOUNTER — Other Ambulatory Visit: Payer: Self-pay

## 2020-12-21 ENCOUNTER — Encounter: Payer: Self-pay | Admitting: Nutrition

## 2020-12-21 ENCOUNTER — Ambulatory Visit (INDEPENDENT_AMBULATORY_CARE_PROVIDER_SITE_OTHER): Payer: Medicare Other | Admitting: Nurse Practitioner

## 2020-12-21 ENCOUNTER — Encounter: Payer: Self-pay | Admitting: Nurse Practitioner

## 2020-12-21 VITALS — BP 168/78 | HR 68 | Ht 67.0 in | Wt 199.4 lb

## 2020-12-21 VITALS — Ht 67.0 in | Wt 199.0 lb

## 2020-12-21 DIAGNOSIS — Z794 Long term (current) use of insulin: Secondary | ICD-10-CM | POA: Diagnosis not present

## 2020-12-21 DIAGNOSIS — N182 Chronic kidney disease, stage 2 (mild): Secondary | ICD-10-CM

## 2020-12-21 DIAGNOSIS — E782 Mixed hyperlipidemia: Secondary | ICD-10-CM

## 2020-12-21 DIAGNOSIS — E1122 Type 2 diabetes mellitus with diabetic chronic kidney disease: Secondary | ICD-10-CM

## 2020-12-21 DIAGNOSIS — E785 Hyperlipidemia, unspecified: Secondary | ICD-10-CM

## 2020-12-21 DIAGNOSIS — I10 Essential (primary) hypertension: Secondary | ICD-10-CM | POA: Diagnosis not present

## 2020-12-21 DIAGNOSIS — E66812 Obesity, class 2: Secondary | ICD-10-CM

## 2020-12-21 LAB — POCT GLYCOSYLATED HEMOGLOBIN (HGB A1C): HbA1c, POC (controlled diabetic range): 7.5 % — AB (ref 0.0–7.0)

## 2020-12-21 NOTE — Patient Instructions (Signed)
  Goals Increase riding bike to 3 times per week. Prevent low blood sugars. Choose whole wheat bread  Add low carb veggies with lunch Keep up the goal. Lose 5 lbs.

## 2020-12-21 NOTE — Progress Notes (Signed)
12/21/2020, 9:44 AM  Endocrinology follow-up note  Subjective:    Patient ID: Kimberly Freeman, female    DOB: 06-01-1941.  Kimberly Freeman is being seen in consultation for management of currently uncontrolled symptomatic diabetes requested by  [name], MD.   Past Medical History:  Diagnosis Date  . Arthritis   . CAD (coronary artery disease) 08/2019   60% RI with other 20-30% lesions at cath  . Diabetes mellitus, type 2 (HCC)   . Essential hypertension   . GERD (gastroesophageal reflux disease)   . Hyperlipidemia   . Unstable angina [facility])     Past Surgical History:  Procedure Laterality Date  . ABDOMINAL HYSTERECTOMY    . BIOPSY  07/18/2017   Procedure: BIOPSY;  Surgeon: [name], MD;  Location: [facility];  Service: Endoscopy;;  gastric  . CHOLECYSTECTOMY  2004  . COLONOSCOPY N/A 07/18/2017   Dr. [name]: Internal and external hemorrhoids.  No future screening/surveillance colonoscopies due to age.  . ESOPHAGOGASTRODUODENOSCOPY N/A 07/18/2017   Dr. [name]: Small hiatal hernia, patchy mild inflammation characterized by congestion, erosions, erythema in the cardia, gastric body and antrum.  Diffuse moderate inflammation in the duodenal bulb and second portion duodenum.  Biopsies benign.  No H. pylori.  Marland Kitchen LEFT HEART CATH AND CORONARY ANGIOGRAPHY N/A 09/21/2019   Procedure: LEFT HEART CATH AND CORONARY ANGIOGRAPHY;  Surgeon: [name], MD;  Location: [facility] INVASIVE CV LAB;  Service: Cardiovascular;  Laterality: N/A;    Social History   Socioeconomic History  . Marital status: Married    Spouse name: [name]   . Number of children: 0  . Years of education: 12+  . Highest education level: 12th grade  Occupational History  . Occupation: retired   Tobacco Use  . Smoking status: Never Smoker  . Smokeless tobacco: Never Used  Vaping Use  . Vaping Use: Never used  Substance and  Sexual Activity  . Alcohol use: No    Alcohol/week: 0.0 standard drinks  . Drug use: No  . Sexual activity: Not Currently    Partners: Male  Other Topics Concern  . Not on file  Social History Narrative   Lives alone with husband    Social Determinants of Health   Financial Resource Strain: Low Risk   . Difficulty of Paying Living Expenses: Not hard at all  Food Insecurity: No Food Insecurity  . Worried About Programme researcher, broadcasting/film/video in the Last Year: Never true  . Ran Out of Food in the Last Year: Never true  Transportation Needs: No Transportation Needs  . Lack of Transportation (Medical): No  . Lack of Transportation (Non-Medical): No  Physical Activity: Insufficiently Active  . Days of Exercise per Week: 3 days  . Minutes of Exercise per Session: 30 min  Stress: No Stress Concern Present  . Feeling of Stress : Not at all  Social Connections: Socially Integrated  . Frequency of Communication with Friends and Family: More than three times a week  . Frequency of Social Gatherings with Friends and Family: More than three times a week  . Attends Religious Services: More than 4 times per year  .  Active Member of Clubs or Organizations: Yes  . Attends Banker Meetings: More than 4 times per year  . Marital Status: Married    Family History  Problem Relation Age of Onset  . Leukemia Mother   . Cancer Mother   . Diabetes Mother   . Prostate cancer Father   . Lung cancer Sister 83  . Diabetes Sister   . Colon cancer Neg Hx     Outpatient Encounter Medications as of 12/21/2020  Medication Sig  . acetaminophen (TYLENOL) 500 MG tablet Take 500 mg by mouth daily as needed for mild pain or moderate pain. For pain  . amLODipine (NORVASC) 2.5 MG tablet Take 2.5 mg by mouth every evening.  Marland Kitchen amLODipine (NORVASC) 5 MG tablet Take 5 mg by mouth every morning.  Marland Kitchen aspirin 81 MG chewable tablet Chew 1 tablet (81 mg total) by mouth daily.  Marland Kitchen atorvastatin (LIPITOR) 80 MG tablet  TAKE 1 TABLET DAILY AT 6 P.M.  . betamethasone dipropionate 0.05 % cream Apply topically 2 (two) times daily.  . calcium-vitamin D (OSCAL WITH D) 500-200 MG-UNIT tablet Take 1 tablet by mouth.  Marland Kitchen glipiZIDE (GLIPIZIDE XL) 2.5 MG 24 hr tablet Take 1 tablet (2.5 mg total) by mouth daily with breakfast.  . glucose blood (ONE TOUCH ULTRA TEST) test strip USE AS INSTRUCTED FOR TWICE DAILY TESTING  . Insulin Glargine (BASAGLAR KWIKPEN) 100 UNIT/ML Inject 45 Units into the skin at bedtime.  . Insulin Pen Needle (BD PEN NEEDLE MICRO U/F) 32G X 6 MM MISC USE TO INJECT INSULIN DAILY  . irbesartan (AVAPRO) 300 MG tablet Take 1 tablet (300 mg total) by mouth daily.  . Lancets (ONETOUCH ULTRASOFT) lancets USE AS DIRECTED TWICE DAILY  . metoprolol tartrate (LOPRESSOR) 100 MG tablet TAKE 1 TABLET TWICE A DAY  . Multiple Vitamin (MULTIVITAMIN WITH MINERALS) TABS tablet Take 1 tablet by mouth daily.  . pantoprazole (PROTONIX) 40 MG tablet TAKE 1 TABLET BY MOUTH 30 MINUTES BEFORE FIRST MEAL  . polyethylene glycol (MIRALAX / GLYCOLAX) 17 g packet Take 17 g by mouth daily as needed.  Marland Kitchen spironolactone (ALDACTONE) 50 MG tablet Take 1 tablet (50 mg total) by mouth daily.  . vitamin B-12 (CYANOCOBALAMIN) 500 MCG tablet Take 500 mcg by mouth daily.   No facility-administered encounter medications on file as of 12/21/2020.    ALLERGIES: Allergies  Allergen Reactions  . Poison Oak Extract [Poison Oak Extract] Hives  . Sulfonamide Derivatives Itching    Burning sensation all over body    VACCINATION STATUS: Immunization History  Administered Date(s) Administered  . Fluad Quad(high Dose 65+) 07/27/2019, 06/23/2020  . Influenza Split 08/01/2011, 07/31/2012  . Influenza Whole 07/29/2007, 07/20/2008, 07/21/2009, 07/19/2010  . Influenza, High Dose Seasonal PF 07/23/2018  . Influenza,inj,Quad PF,6+ Mos 08/07/2013, 06/15/2014, 07/19/2015, 06/28/2016, 07/25/2017  . Moderna Sars-Covid-2 Vaccination 12/22/2019,  01/19/2020, 09/25/2020  . Pneumococcal Conjugate-13 10/12/2014  . Pneumococcal Polysaccharide-23 03/16/2004, 08/29/2012  . Td 03/16/2004  . Tdap 03/31/2012  . Zoster 01/02/2007    Diabetes She presents for her follow-up diabetic visit. She has type 2 diabetes mellitus. Onset time: She was diagnosed at approximate age of 24 years. Her disease course has been improving. Hypoglycemia symptoms include nervousness/anxiousness and tremors. Pertinent negatives for hypoglycemia include no confusion, headaches, pallor or seizures. Associated symptoms include fatigue. Pertinent negatives for diabetes include no chest pain, no polydipsia, no polyphagia and no polyuria. There are no hypoglycemic complications. Symptoms are improving. Diabetic complications include heart disease and  nephropathy. Risk factors for coronary artery disease include diabetes mellitus, dyslipidemia, family history, hypertension, obesity, sedentary lifestyle and post-menopausal. Current diabetic treatment includes insulin injections and oral agent (monotherapy). She is compliant with treatment most of the time. Her weight is fluctuating minimally. She is following a generally healthy diet. When asked about meal planning, she reported none. She has had a previous visit with a dietitian. She participates in exercise intermittently. Her home blood glucose trend is decreasing steadily. Her breakfast blood glucose range is generally 90-110 mg/dl. (She presents today with her meter and logs showing at target fasting glycemic profile.  Her POCT A1c today is 7.5% improving from last visit of 9.2%.  She denies any significant hypoglycemia.) An ACE inhibitor/angiotensin II receptor blocker is not being taken. She sees a podiatrist.Eye exam is current.  Hyperlipidemia This is a chronic problem. The current episode started more than 1 year ago. The problem is controlled. Recent lipid tests were reviewed and are normal. Exacerbating diseases include  chronic renal disease, diabetes and obesity. Factors aggravating her hyperlipidemia include beta blockers. Pertinent negatives include no chest pain, myalgias or shortness of breath. Current antihyperlipidemic treatment includes statins. The current treatment provides mild improvement of lipids. There are no compliance problems.  Risk factors for coronary artery disease include diabetes mellitus, dyslipidemia, obesity, hypertension, a sedentary lifestyle and post-menopausal.  Hypertension This is a chronic problem. The current episode started more than 1 year ago. The problem is unchanged. The problem is controlled. Pertinent negatives include no chest pain, headaches, palpitations or shortness of breath. There are no associated agents to hypertension. Risk factors for coronary artery disease include sedentary lifestyle, obesity, post-menopausal state, diabetes mellitus and dyslipidemia. Past treatments include calcium channel blockers, beta blockers and diuretics. The current treatment provides mild improvement. There are no compliance problems.  Hypertensive end-organ damage includes kidney disease and CAD/MI. Identifiable causes of hypertension include chronic renal disease.    Review of systems  Constitutional: + Minimally fluctuating body weight,  current Body mass index is 31.23 kg/m. , no fatigue, no subjective hyperthermia, no subjective hypothermia Eyes: no blurry vision, no xerophthalmia ENT: no sore throat, no nodules palpated in throat, no dysphagia/odynophagia, no hoarseness Cardiovascular: no chest pain, no shortness of breath, no palpitations, no leg swelling Respiratory: no cough, no shortness of breath Gastrointestinal: no nausea/vomiting/diarrhea Musculoskeletal: no muscle/joint aches Skin: no rashes, no hyperemia Neurological: no tremors, no numbness, no tingling, no dizziness Psychiatric: no depression, no anxiety   Objective:    BP (!) 168/78 (BP Location: Left Arm)    Pulse 68   Ht 5\' 7"  (1.702 m)   Wt 199 lb 6.4 oz (90.4 kg)   BMI 31.23 kg/m   Wt Readings from Last 3 Encounters:  12/21/20 199 lb 6.4 oz (90.4 kg)  12/08/20 196 lb (88.9 kg)  10/04/20 200 lb (90.7 kg)    BP Readings from Last 3 Encounters:  12/21/20 (!) 168/78  12/08/20 137/78  09/27/20 128/78     Physical Exam- Limited  Constitutional:  Body mass index is 31.23 kg/m. , not in acute distress, normal state of mind Eyes:  EOMI, no exophthalmos Neck: Supple Thyroid: No gross goiter Cardiovascular: RRR, no murmers, rubs, or gallops, no edema Respiratory: Adequate breathing efforts, no crackles, rales, rhonchi, or wheezing Musculoskeletal: no gross deformities, strength intact in all four extremities, no gross restriction of joint movements Skin:  no rashes, no hyperemia Neurological: no tremor with outstretched hands   Foot exam:   No rashes,  ulcers, cuts, calluses, onychodystrophy.   Good pulses bilat.  Good sensation to 10 g monofilament bilat.    POCT ABI Results 12/21/20   Right ABI:  1.09      Left ABI:  1.17  Right leg systolic / diastolic: 183/81 mmHg Left leg systolic / diastolic: 196/86 mmHg  Arm systolic / diastolic: 168/78 mmHG  Detailed report will be scanned into patient chart.  CMP     Component Value Date/Time   NA 141 12/19/2020 1140   K 4.6 12/19/2020 1140   CL 102 12/19/2020 1140   CO2 22 12/19/2020 1140   GLUCOSE 275 (H) 12/19/2020 1140   GLUCOSE 302 (H) 08/26/2020 1137   BUN 28 (H) 12/19/2020 1140   CREATININE 1.38 (H) 12/19/2020 1140   CREATININE 1.14 (H) 12/15/2019 1048   CALCIUM 9.6 12/19/2020 1140   PROT 7.3 09/05/2020 0810   ALBUMIN 4.2 09/05/2020 0810   AST 26 09/05/2020 0810   ALT 29 09/05/2020 0810   ALKPHOS 134 (H) 09/05/2020 0810   BILITOT 0.4 09/05/2020 0810   GFRNONAA 42 (L) 09/05/2020 0810   GFRNONAA 45 (L) 08/26/2020 1137   GFRNONAA 45 (L) 12/15/2019 1048   GFRAA 48 (L) 09/05/2020 0810   GFRAA 53 (L) 12/15/2019  1048     Diabetic Labs (most recent): Lab Results  Component Value Date   HGBA1C 9.2 (A) 09/01/2020   HGBA1C 9.6 (A) 05/23/2020   HGBA1C 9.6 05/23/2020   HGBA1C 9.6 (A) 05/23/2020   HGBA1C 9.6 (A) 05/23/2020     Lipid Panel ( most recent) Lipid Panel     Component Value Date/Time   CHOL 156 09/05/2020 0810   TRIG 106 09/05/2020 0810   HDL 46 09/05/2020 0810   CHOLHDL 3.4 09/05/2020 0810   CHOLHDL 3.3 12/15/2019 1048   VLDL 21 03/22/2017 0803   LDLCALC 91 09/05/2020 0810   LDLCALC 80 12/15/2019 1048   LABVLDL 19 09/05/2020 0810      Lab Results  Component Value Date   TSH 4.360 05/23/2020   TSH 6.85 (H) 07/24/2019   TSH 5.19 (H) 08/04/2018   TSH 3.81 04/10/2016   TSH 2.410 12/13/2015   TSH 5.289 (H) 11/14/2015   TSH 3.963 06/14/2014   TSH 3.427 06/03/2013   TSH 3.333 01/22/2012   TSH 3.737 05/15/2010   FREET4 1.0 12/15/2019   FREET4 0.8 07/24/2019   FREET4 0.9 08/04/2018   FREET4 0.78 12/13/2015   FREET4 0.84 11/14/2015       Assessment & Plan:   1) Type 2 diabetes mellitus with stage 2 chronic kidney disease, with long-term current use of insulin (HCC)  - ETHA STAMBAUGH has currently uncontrolled symptomatic type 2 DM since  80 years of age. Recent labs reviewed.  She presents today with her meter and logs showing at target fasting glycemic profile.  Her POCT A1c today is 7.5% improving from last visit of 9.2%.  She denies any significant hypoglycemia.  Analysis of her meter shows 7-day average of 122, 14-day average of 120, 30-day average of 113.  - I had a long discussion with her about the progressive nature of diabetes and the pathology behind its complications. -her diabetes is complicated by stage 2 CKD, obesity /sedentary life, and she remains at a high risk for more acute and chronic complications which include CAD, CVA, CKD, retinopathy, and neuropathy. These are all discussed in detail with her.  - Nutritional counseling repeated at each  appointment due to patients tendency to fall back  in to old habits.  - The patient admits there is a room for improvement in their diet and drink choices. -  Suggestion is made for the patient to avoid simple carbohydrates from their diet including Cakes, Sweet Desserts / Pastries, Ice Cream, Soda (diet and regular), Sweet Tea, Candies, Chips, Cookies, Sweet Pastries,  Store Bought Juices, Alcohol in Excess of  1-2 drinks a day, Artificial Sweeteners, Coffee Creamer, and "Sugar-free" Products. This will help patient to have stable blood glucose profile and potentially avoid unintended weight gain.   - I encouraged the patient to switch to  unprocessed or minimally processed complex starch and increased protein intake (animal or plant source), fruits, and vegetables.   - Patient is advised to stick to a routine mealtimes to eat 3 meals  a day and avoid unnecessary snacks ( to snack only to correct hypoglycemia).  - I have approached her with the following individualized plan to manage  her diabetes and patient agrees:   - she will continue to need at least basal insulin in order for her to achieve control of diabetes to target.    - Given her stable glycemic control and absence of hypoglycemia, she will be continued on her current medication regimen of Basaglar 45 units SQ nightly and Glipizide 2.5 mg XL po daily with breakfast.   -She is encouraged to continue monitoring glucose twice daily, before breakfast and before bed, and to call the clinic if she has readings less than 70 or greater than 300 for 3 tests in a row.  -She will be considered for incretin therapy during her next visit.  - Specific targets for  A1c;  LDL, HDL,  and Triglycerides were discussed with the patient.  2) Blood Pressure /Hypertension:  Her blood pressure is controlled to target for her age.  She is advised to continue Norvasc 5 mg po daily, Irbesartan 300 mg po daily, Metoprolol 100 mg po twice daily, and Aldactone  50 mg po daily.  3) Lipids/Hyperlipidemia:    Her most recent lipid panel from 09/05/20 shows controlled LDL at 91.  She is advised to continue Atorvastatin 80 mg po daily at bedtime.  Side effects and precautions discussed with her.  4)  Weight/Diet:  Her Body mass index is 31.23 kg/m.  -   clearly complicating her diabetes care.   she is  a candidate for weight loss. I discussed with her the fact that loss of 5 - 10% of her  current body weight will have the most impact on her diabetes management.  Exercise, and detailed carbohydrates information provided  -  detailed on discharge instructions.  5) Chronic Care/Health Maintenance: -she  is on Statin medications and  is encouraged to initiate and continue to follow up with Ophthalmology, Dentist,  Podiatrist at least yearly or according to recommendations, and advised to   stay away from smoking. I have recommended yearly flu vaccine and pneumonia vaccine at least every 5 years; moderate intensity exercise for up to 150 minutes weekly; and  sleep for at least 7 hours a day.  - she is  advised to maintain close follow up with Kerri PerchesSimpson, Margaret E, MD for primary care needs, as well as her other providers for optimal and coordinated care.  - Time spent on this patient care encounter:  40 min, of which > 50% was spent in  counseling and the rest reviewing her blood glucose logs , discussing her hypoglycemia and hyperglycemia episodes, reviewing her current and  previous labs / studies  ( including abstraction from other facilities) and medications  doses and developing a  long term treatment plan and documenting her care.   Please refer to Patient Instructions for Blood Glucose Monitoring and Insulin/Medications Dosing Guide"  in media tab for additional information. Please  also refer to " Patient Self Inventory" in the Media  tab for reviewed elements of pertinent patient history.  Mauro Kaufmann participated in the discussions, expressed  understanding, and voiced agreement with the above plans.  All questions were answered to her satisfaction. she is encouraged to contact clinic should she have any questions or concerns prior to her return visit.   Follow up plan: - Return in about 4 months (around 04/22/2021) for Diabetes follow up with A1c in office, Previsit labs, Bring glucometer and logs.  Ronny Bacon, [facility] [address] [place], [place] [address] Phone: [phone] Fax: [phone]  12/21/2020, 9:44 AM

## 2020-12-21 NOTE — Progress Notes (Signed)
  Medical Nutrition Therapy:  Appt start time: 1000 end time:  1030 am Assessment:  Primary concerns today: Diabetes Type 2, Obesity.   BS are much better. A1C 7.5% down from 9%. Changes made: eating smaller portions. Has cut out snacks and grazing. Drinking a lot more water. Feels better. Saw Ronny Bacon, FNP today. Basaglar going to stay at 45 units at night and reduced Glipizide to 2.5 mg/dl.   Lab Results  Component Value Date   HGBA1C 7.5 (A) 12/21/2020    Preferred Learning Style:   No preference indicated   Learning Readiness:   Ready  Change in progress   MEDICATIONS:    DIETARY INTAKE:.    24-hr recall:  B (  9AM): Cherrios Medley-oat flakeswith milk, lactose milk, unsweet tea.  Snk ( AM):   L ( 1 PM): [place] sandwich,  With little mayo,  water D ( 6/7 PM):[place], spinach, baked potato, strawberies, water' Snk ( PM):  Beverages: water and unsweet tea  Usual physical activity:  On exercise bike  Estimated energy needs: 1200  calories 135 g carbohydrates 80 g protein 35 g fat  Progress Towards Goal(s):  In progress.   Nutritional Diagnosis:  NB-1.1 Food and nutrition-related knowledge deficit As related to Diabetes Type 2.  As evidenced by A1C 9.6%.    Intervention:  Nutrition and Diabetes education provided on My Plate, CHO counting, meal planning, portion sizes, timing of meals, avoiding snacks between meals unless having a low blood sugar, target ranges for A1C and blood sugars, signs/symptoms and treatment of hyper/hypoglycemia, monitoring blood sugars, taking medications as prescribed, benefits of exercising 30 minutes per day and prevention of complications of DM.  Goals  Goals Increase riding bike to 3 times per week. Prevent low blood sugars. Choose whole wheat bread  Add low carb veggies with lunch Keep up the goal. Lose 5 lbs.   Teaching Method Utilized:  Visual Auditory Hands on  Handouts given during visit include:  The  Plate Method   Meal Plan Card    Barriers to learning/adherence to lifestyle change: none  Demonstrated degree of understanding via:  Teach Back   Monitoring/Evaluation:  Dietary intake, exercise, , and body weight in 3-6 month(s).

## 2020-12-21 NOTE — Patient Instructions (Signed)

## 2021-01-16 ENCOUNTER — Other Ambulatory Visit: Payer: Self-pay

## 2021-01-16 MED ORDER — EASY TOUCH SAFETY LANCETS 28G MISC: 3 refills | Status: DC

## 2021-01-24 ENCOUNTER — Other Ambulatory Visit (HOSPITAL_COMMUNITY): Payer: Self-pay

## 2021-01-24 DIAGNOSIS — D500 Iron deficiency anemia secondary to blood loss (chronic): Secondary | ICD-10-CM

## 2021-01-24 DIAGNOSIS — D649 Anemia, unspecified: Secondary | ICD-10-CM

## 2021-01-25 ENCOUNTER — Inpatient Hospital Stay (HOSPITAL_COMMUNITY): Payer: Medicare Other | Attending: Hematology

## 2021-01-25 ENCOUNTER — Other Ambulatory Visit: Payer: Self-pay

## 2021-01-25 DIAGNOSIS — D500 Iron deficiency anemia secondary to blood loss (chronic): Secondary | ICD-10-CM

## 2021-01-25 DIAGNOSIS — Z79899 Other long term (current) drug therapy: Secondary | ICD-10-CM | POA: Diagnosis not present

## 2021-01-25 DIAGNOSIS — D649 Anemia, unspecified: Secondary | ICD-10-CM

## 2021-01-25 DIAGNOSIS — D631 Anemia in chronic kidney disease: Secondary | ICD-10-CM | POA: Diagnosis not present

## 2021-01-25 DIAGNOSIS — N183 Chronic kidney disease, stage 3 unspecified: Secondary | ICD-10-CM | POA: Insufficient documentation

## 2021-01-25 LAB — CBC WITH DIFFERENTIAL/PLATELET
Abs Immature Granulocytes: 0.01 10*3/uL (ref 0.00–0.07)
Basophils Absolute: 0 10*3/uL (ref 0.0–0.1)
Basophils Relative: 1 %
Eosinophils Absolute: 0.2 10*3/uL (ref 0.0–0.5)
Eosinophils Relative: 3 %
HCT: 37.5 % (ref 36.0–46.0)
Hemoglobin: 11.7 g/dL — ABNORMAL LOW (ref 12.0–15.0)
Immature Granulocytes: 0 %
Lymphocytes Relative: 34 %
Lymphs Abs: 2.1 10*3/uL (ref 0.7–4.0)
MCH: 29.8 pg (ref 26.0–34.0)
MCHC: 31.2 g/dL (ref 30.0–36.0)
MCV: 95.4 fL (ref 80.0–100.0)
Monocytes Absolute: 0.6 10*3/uL (ref 0.1–1.0)
Monocytes Relative: 11 %
Neutro Abs: 3.1 10*3/uL (ref 1.7–7.7)
Neutrophils Relative %: 51 %
Platelets: 210 10*3/uL (ref 150–400)
RBC: 3.93 MIL/uL (ref 3.87–5.11)
RDW: 12.5 % (ref 11.5–15.5)
WBC: 6.1 10*3/uL (ref 4.0–10.5)
nRBC: 0 % (ref 0.0–0.2)

## 2021-01-25 LAB — COMPREHENSIVE METABOLIC PANEL
ALT: 30 U/L (ref 0–44)
AST: 27 U/L (ref 15–41)
Albumin: 3.9 g/dL (ref 3.5–5.0)
Alkaline Phosphatase: 87 U/L (ref 38–126)
Anion gap: 8 (ref 5–15)
BUN: 34 mg/dL — ABNORMAL HIGH (ref 8–23)
CO2: 24 mmol/L (ref 22–32)
Calcium: 9.6 mg/dL (ref 8.9–10.3)
Chloride: 107 mmol/L (ref 98–111)
Creatinine, Ser: 1.26 mg/dL — ABNORMAL HIGH (ref 0.44–1.00)
GFR, Estimated: 43 mL/min — ABNORMAL LOW (ref >60)
Glucose, Bld: 201 mg/dL — ABNORMAL HIGH (ref 70–99)
Potassium: 4.6 mmol/L (ref 3.5–5.1)
Sodium: 139 mmol/L (ref 135–145)
Total Bilirubin: 0.7 mg/dL (ref 0.3–1.2)
Total Protein: 7.4 g/dL (ref 6.5–8.1)

## 2021-01-25 LAB — LACTATE DEHYDROGENASE: LDH: 174 U/L (ref 98–192)

## 2021-01-25 LAB — FERRITIN: Ferritin: 184 ng/mL (ref 11–307)

## 2021-01-25 LAB — VITAMIN D 25 HYDROXY (VIT D DEFICIENCY, FRACTURES): Vit D, 25-Hydroxy: 59.3 ng/mL (ref 30–100)

## 2021-01-25 LAB — IRON AND TIBC
Iron: 104 ug/dL (ref 28–170)
Saturation Ratios: 29 % (ref 10.4–31.8)
TIBC: 357 ug/dL (ref 250–450)
UIBC: 253 ug/dL

## 2021-01-25 LAB — FOLATE: Folate: 102 ng/mL (ref >5.9)

## 2021-01-25 LAB — VITAMIN B12: Vitamin B-12: 1940 pg/mL — ABNORMAL HIGH (ref 180–914)

## 2021-01-29 DIAGNOSIS — Z23 Encounter for immunization: Secondary | ICD-10-CM | POA: Diagnosis not present

## 2021-02-01 ENCOUNTER — Inpatient Hospital Stay (HOSPITAL_BASED_OUTPATIENT_CLINIC_OR_DEPARTMENT_OTHER): Payer: Medicare Other | Admitting: Physician Assistant

## 2021-02-01 DIAGNOSIS — R7989 Other specified abnormal findings of blood chemistry: Secondary | ICD-10-CM

## 2021-02-01 DIAGNOSIS — D500 Iron deficiency anemia secondary to blood loss (chronic): Secondary | ICD-10-CM

## 2021-02-01 NOTE — Progress Notes (Signed)
Virtual Visit via Telephone Note [facility]  I connected with Kimberly Freeman  on 02/01/21  at  3:27 PM  by telephone and verified that I am speaking with the correct person using two identifiers.  Location: Patient: Home Provider: [facility]   I discussed the limitations, risks, security and privacy concerns of performing an evaluation and management service by telephone and the availability of in person appointments. I also discussed with the patient that there may be a patient responsible charge related to this service. The patient expressed understanding and agreed to proceed.   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  She was last seen by NP Faythe Casa on 09/02/2020.  She reports that she has been doing well since her last visit.  She is not taking any iron tablets at home.  She has mild constipation, but uses Miralax, which appears to be working.  She has no other complaints today.  She denies any signs or symptoms of blood loss; no hematemesis, hematochezia, melena, epistaxis, hemoptysis, hematuria.  She denies any easy bruising or bleeding.  She denies any recent chest pain on exertion, shortness of breath on minimal exertion, presyncopal episodes, or palpitations.  She reports that her energy level is normal, but she does some days when she feels a bit more fatigued, and her appetite is 100%.  She is eating well to maintain her stable weight at this time.  We discussed her most recent lab results, with hemoglobin 11.7 (normocytic with MCV 95.4).  Ferritin within target range at 184; iron and TIBC within normal range.  Vitamin D and folate within normal limits.  Vitamin B12 somewhat elevated at 1940.  CMP shows creatinine 1.26 and EGFR 43, in keeping with her CKD stage  III.   Observations/Objective: Review of Systems  Constitutional: Negative for chills, diaphoresis, fever, malaise/fatigue and weight loss.  Eyes: Negative for blurred vision.  Respiratory: Negative for cough, hemoptysis and shortness of breath.   Cardiovascular: Negative for chest pain, palpitations and leg swelling.  Gastrointestinal: Positive for constipation. Negative for abdominal pain, blood in stool, diarrhea, melena, nausea and vomiting.  Genitourinary: Negative.   Skin: Negative for rash.  Neurological: Negative for dizziness and headaches.  Endo/Heme/Allergies: Does not bruise/bleed easily.     PHYSICAL EXAM (per limitations of virtual telephone visit): The patient is alert and oriented x 3, exhibiting adequate mentation, good mood, and ability to speak in full sentences and execute sound judgement.   Assessment: 1.  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 pylori -Colonoscopy (07/18/2017): Internal and external hemorrhoids -SPEP and IFE checked in 2018 was negative for M spike or monoclonal gammopathy -Patient denies any current bright red bleeding per rectum or melena.  She denies current B symptoms or any recent hospitalizations or infections. -Most recent labs obtained 01/25/2021: Hgb 11.7 (normocytic with MCV 95.4).  Ferritin within target range at 184; iron and TIBC within normal range.  Vitamin D and folate within normal limits.  Vitamin B12 somewhat elevated at 1940.   -Patient is not currently taking any iron supplementation  2.  Chronic kidney disease, stage III -CMP (01/25/2021) shows creatinine 1.26 and EGFR 43, in  keeping with her CKD stage III. -Discussed with patient that she may want to ask her PCP for referral to nephrologist -Patient encouraged to drink plenty of water and avoid use of NSAIDs  3.   History of elevated LFTs  -LFTs were noted to be minimally elevated at her last visit November 2021 -Appears to be self-limited and has resolved without intervention -Unknown etiology, but no further work-up needed at this time   Plan: 1.  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 The patient was provided an opportunity to ask questions and all were answered. The patient agreed with the plan and demonstrated an understanding of the instructions.   The patient was advised to call back or seek an in-person evaluation if the symptoms worsen or if the condition fails to improve as anticipated.  I provided 15 minutes of non-face-to-face time during this encounter.   [name], PA-C

## 2021-02-08 ENCOUNTER — Encounter: Payer: Self-pay | Admitting: Cardiology

## 2021-02-08 ENCOUNTER — Ambulatory Visit (INDEPENDENT_AMBULATORY_CARE_PROVIDER_SITE_OTHER): Payer: Medicare Other | Admitting: Cardiology

## 2021-02-08 VITALS — BP 144/60 | HR 64 | Ht 67.0 in | Wt 207.0 lb

## 2021-02-08 DIAGNOSIS — I517 Cardiomegaly: Secondary | ICD-10-CM | POA: Diagnosis not present

## 2021-02-08 DIAGNOSIS — I25119 Atherosclerotic heart disease of native coronary artery with unspecified angina pectoris: Secondary | ICD-10-CM | POA: Diagnosis not present

## 2021-02-08 NOTE — Progress Notes (Signed)
Cardiology Office Note  Date: 02/08/2021   ID: Kimberly Freeman, Kimberly Freeman 1940-10-23, MRN [id number]  PCP:  [name], MD  Cardiologist:  Nona Dell, MD Electrophysiologist:  None   Chief Complaint  Patient presents with  . Cardiac follow-up    History of Present Illness: Kimberly Freeman is an 80 y.o. female former patient of Dr. Purvis Sheffield now presenting to establish follow-up with me.  I reviewed her records and updated the chart.  She was last seen by Mr. Lenn Cal in November 2021.  She is here for a routine visit.  Reports no exertional angina or shortness of breath beyond NYHA class II.  She does have caregiver stress, her husband is dealing with dementia.  She does have a stationary bicycle at home, also enjoys walking.  Has not been as regular with activity, we discussed a basic exercise plan.  I went over her medications which are listed below.  She reports compliance with therapy, no obvious intolerances.  Lab work from PCP is noted below.  Past Medical History:  Diagnosis Date  . Arthritis   . CAD (coronary artery disease) 08/2019   60% RI with other 20-30% lesions at cath  . Diabetes mellitus, type 2 (HCC)   . Essential hypertension   . GERD (gastroesophageal reflux disease)   . Hyperlipidemia   . LVH (left ventricular hypertrophy)     Past Surgical History:  Procedure Laterality Date  . ABDOMINAL HYSTERECTOMY    . BIOPSY  07/18/2017   Procedure: BIOPSY;  Surgeon: [name], MD;  Location: [facility];  Service: Endoscopy;;  gastric  . CHOLECYSTECTOMY  2004  . COLONOSCOPY N/A 07/18/2017   Dr. [name]: Internal and external hemorrhoids.  No future screening/surveillance colonoscopies due to age.  . ESOPHAGOGASTRODUODENOSCOPY N/A 07/18/2017   Dr. [name]: Small hiatal hernia, patchy mild inflammation characterized by congestion, erosions, erythema in the cardia, gastric body and antrum.  Diffuse moderate inflammation in the duodenal bulb and  second portion duodenum.  Biopsies benign.  No H. pylori.  Marland Kitchen LEFT HEART CATH AND CORONARY ANGIOGRAPHY N/A 09/21/2019   Procedure: LEFT HEART CATH AND CORONARY ANGIOGRAPHY;  Surgeon: [name], MD;  Location: [facility] INVASIVE CV LAB;  Service: Cardiovascular;  Laterality: N/A;    Current Outpatient Medications  Medication Sig Dispense Refill  . acetaminophen (TYLENOL) 500 MG tablet Take 500 mg by mouth daily as needed for mild pain or moderate pain. For pain    . amLODipine (NORVASC) 2.5 MG tablet Take 2.5 mg by mouth every evening.    Marland Kitchen amLODipine (NORVASC) 5 MG tablet Take 5 mg by mouth every morning.    Marland Kitchen aspirin 81 MG chewable tablet Chew 1 tablet (81 mg total) by mouth daily. 30 tablet 0  . atorvastatin (LIPITOR) 80 MG tablet TAKE 1 TABLET DAILY AT 6 P.M. 90 tablet 3  . betamethasone dipropionate 0.05 % cream Apply topically 2 (two) times daily. 45 g 1  . calcium-vitamin D (OSCAL WITH D) 500-200 MG-UNIT tablet Take 1 tablet by mouth.    . Easy Touch Safety Lancets 28G MISC Twice daily testing dx E11.65 300 each 3  . glipiZIDE (GLIPIZIDE XL) 2.5 MG 24 hr tablet Take 1 tablet (2.5 mg total) by mouth daily with breakfast. 90 tablet 3  . glucose blood (ONE TOUCH ULTRA TEST) test strip USE AS INSTRUCTED FOR TWICE DAILY TESTING 200 each 3  . Insulin Glargine (BASAGLAR KWIKPEN) 100 UNIT/ML Inject 45 Units into the  skin at bedtime. 45 mL 0  . Insulin Pen Needle (BD PEN NEEDLE MICRO U/F) 32G X 6 MM MISC USE TO INJECT INSULIN DAILY 100 each 3  . irbesartan (AVAPRO) 300 MG tablet Take 1 tablet (300 mg total) by mouth daily. 90 tablet 3  . Lancets (ONETOUCH ULTRASOFT) lancets USE AS DIRECTED TWICE DAILY 200 each 3  . metoprolol tartrate (LOPRESSOR) 100 MG tablet TAKE 1 TABLET TWICE A DAY 180 tablet 3  . Multiple Vitamin (MULTIVITAMIN WITH MINERALS) TABS tablet Take 1 tablet by mouth daily.    . pantoprazole (PROTONIX) 40 MG tablet TAKE 1 TABLET BY MOUTH 30 MINUTES BEFORE FIRST MEAL 90 tablet 3  .  polyethylene glycol (MIRALAX / GLYCOLAX) 17 g packet Take 17 g by mouth daily as needed.    Marland Kitchen spironolactone (ALDACTONE) 50 MG tablet Take 1 tablet (50 mg total) by mouth daily. 90 tablet 3  . vitamin B-12 (CYANOCOBALAMIN) 500 MCG tablet Take 500 mcg by mouth daily.     No current facility-administered medications for this visit.   Allergies:  Poison oak extract [poison oak extract] and Sulfonamide derivatives   ROS: No palpitations or syncope.  Physical Exam: VS:  BP (!) 144/60   Pulse 64   Ht 5\' 7"  (1.702 m)   Wt 207 lb (93.9 kg)   SpO2 98%   BMI 32.42 kg/m , BMI Body mass index is 32.42 kg/m.  Wt Readings from Last 3 Encounters:  02/08/21 207 lb (93.9 kg)  12/21/20 199 lb (90.3 kg)  12/21/20 199 lb 6.4 oz (90.4 kg)    General: Patient appears comfortable at rest. HEENT: Conjunctiva and lids normal, wearing a mask. Neck: Supple, no elevated JVP or carotid bruits, no thyromegaly. Lungs: Clear to auscultation, nonlabored breathing at rest. Cardiac: Regular rate and rhythm, no S3, 3/6 systolic murmur. Extremities: No pitting edema, distal pulses 2+.  ECG:  An ECG dated 09/06/2020 was personally reviewed today and demonstrated:  Sinus rhythm with right bundle branch block and repolarization abnormalities.  Recent Labwork: 05/23/2020: TSH 4.360 01/25/2021: ALT 30; AST 27; BUN 34; Creatinine, Ser 1.26; Hemoglobin 11.7; Platelets 210; Potassium 4.6; Sodium 139     Component Value Date/Time   CHOL 156 09/05/2020 0810   TRIG 106 09/05/2020 0810   HDL 46 09/05/2020 0810   CHOLHDL 3.4 09/05/2020 0810   CHOLHDL 3.3 12/15/2019 1048   VLDL 21 03/22/2017 0803   LDLCALC 91 09/05/2020 0810   LDLCALC 80 12/15/2019 1048    Other Studies Reviewed Today:  Cardiac catheterization 09/21/2019:  Lat Ramus lesion is 60% stenosed.  Mid LAD lesion is 30% stenosed.  Prox RCA lesion is 20% stenosed.  Mid RCA lesion is 20% stenosed.   Mild to moderate CAD with 30% smooth narrowing in the  mid LAD; 50 to 60% proximal stenosis in inferior branch of a ramus intermediate vessel; and mild 20% proximal and mid RCA stenoses and a dominant RCA.  LV EDP 21 mm.  The echo Doppler study done earlier today showed vigorous LV contractility with an EF at 70 to 75% with severe LVH.  Echocardiogram 09/21/2019: 1. Left ventricular ejection fraction, by visual estimation, is 70 to  75%. The left ventricle has hyperdynamic function. There is severely  increased left ventricular hypertrophy.  2. Left ventricular diastolic parameters are consistent with Grade I  diastolic dysfunction (impaired relaxation).  3. Global right ventricle has normal systolic function.The right  ventricular size is normal. Mildly increased right ventricular wall  thickness.  4. Left atrial size was mild-moderately dilated.  5. Right atrial size was normal.  6. Mild aortic valve annular calcification.  7. Mild mitral annular calcification.  8. The mitral valve is grossly normal. Trace mitral valve regurgitation.  9. The tricuspid valve is grossly normal. Tricuspid valve regurgitation  is mild.  10. The aortic valve is tricuspid. Aortic valve regurgitation is trivial.  Moderately sclerotic to mildly stenotic aortic valve.  11. The pulmonic valve was not well visualized. Pulmonic valve  regurgitation is trivial.  12. TR signal is inadequate for assessing pulmonary artery systolic  pressure.  13. The inferior vena cava is dilated in size with >50% respiratory  variability, suggesting right atrial pressure of 8 mmHg.   Assessment and Plan:  1.  CAD, moderate disease involving ramus intermedius branch by cardiac catheterization in 2020.  No active angina symptoms.  We will continue with observation on aspirin, Norvasc, Lipitor, Lopressor, and Avapro.  Encouraged regular exercise plan.  2.  Severe left ventricular hypertrophy with vigorous LVEF at 70 to 75%.  This along with mildly stenotic aortic valve  corresponds to cardiac murmur.  Would focus on blood pressure control, currently on multimodal therapy including relatively high-dose beta-blocker.  She has associated ECG abnormalities due to repolarization changes.  No history of sudden unexplained syncope.  Medication Adjustments/Labs and Tests Ordered: Current medicines are reviewed at length with the patient today.  Concerns regarding medicines are outlined above.   Tests Ordered: No orders of the defined types were placed in this encounter.   Medication Changes: No orders of the defined types were placed in this encounter.   Disposition:  Follow up 1 year in the [place] office.  Signed, Jonelle Sidle, MD, [facility] 02/08/2021 11:33 AM    [facility] [address] [place], [place], [place] [address] Phone: [phone]; Fax: [phone]

## 2021-02-08 NOTE — Patient Instructions (Addendum)
Medication Instructions:   Your physician recommends that you continue on your current medications as directed. Please refer to the Current Medication list given to you today.  Labwork:  none  Testing/Procedures:  none  Follow-Up:  Your physician recommends that you schedule a follow-up appointment in: 1 year at the [place] office. You will receive a reminder letter in the mail in about 10 months reminding you to call and schedule your appointment. If you don't receive this letter, please contact our office.  Any Other Special Instructions Will Be Listed Below (If Applicable).  If you need a refill on your cardiac medications before your next appointment, please call your pharmacy.

## 2021-02-14 DIAGNOSIS — B351 Tinea unguium: Secondary | ICD-10-CM | POA: Diagnosis not present

## 2021-02-14 DIAGNOSIS — E1142 Type 2 diabetes mellitus with diabetic polyneuropathy: Secondary | ICD-10-CM | POA: Diagnosis not present

## 2021-02-28 ENCOUNTER — Encounter: Payer: Self-pay | Admitting: Orthopaedic Surgery

## 2021-02-28 ENCOUNTER — Other Ambulatory Visit: Payer: Self-pay

## 2021-02-28 ENCOUNTER — Ambulatory Visit (INDEPENDENT_AMBULATORY_CARE_PROVIDER_SITE_OTHER): Payer: Medicare Other | Admitting: Orthopaedic Surgery

## 2021-02-28 DIAGNOSIS — G8929 Other chronic pain: Secondary | ICD-10-CM | POA: Diagnosis not present

## 2021-02-28 DIAGNOSIS — M25562 Pain in left knee: Secondary | ICD-10-CM | POA: Diagnosis not present

## 2021-02-28 NOTE — Progress Notes (Signed)
PROCEDURE NOTE:  The patient requests injections of the left knee , verbal consent was obtained.  The left knee was prepped appropriately after time out was performed.   Sterile technique was observed and injection of 1 cc of Celestone 6 mg with several cc's of plain xylocaine. Anesthesia was provided by ethyl chloride and a 20-gauge needle was used to inject the knee area. The injection was tolerated well.  A band aid dressing was applied.  The patient was advised to apply ice later today and tomorrow to the injection sight as needed.  Return prn.  Call if any problem.  Precautions discussed.   Electronically Signed Darreld Mclean, MD 5/10/20222:22 PM

## 2021-03-06 ENCOUNTER — Other Ambulatory Visit: Payer: Self-pay | Admitting: Family Medicine

## 2021-03-06 ENCOUNTER — Telehealth: Payer: Self-pay

## 2021-03-06 ENCOUNTER — Other Ambulatory Visit: Payer: Self-pay

## 2021-03-06 MED ORDER — BASAGLAR KWIKPEN 100 UNIT/ML ~~LOC~~ SOPN: 45.0000 [IU] | PEN_INJECTOR | Freq: Every day | SUBCUTANEOUS | 3 refills | Status: DC

## 2021-03-06 NOTE — Telephone Encounter (Signed)
Refill sent.

## 2021-03-06 NOTE — Telephone Encounter (Signed)
Patient called left voicemail [facility]. Patient did not say which insulin. Please advise call back # [phone].

## 2021-03-24 ENCOUNTER — Ambulatory Visit: Payer: Medicare Other | Admitting: Cardiology

## 2021-04-17 ENCOUNTER — Other Ambulatory Visit: Payer: Self-pay | Admitting: Family Medicine

## 2021-04-25 ENCOUNTER — Ambulatory Visit: Payer: Medicare Other | Admitting: Nurse Practitioner

## 2021-04-25 DIAGNOSIS — E1142 Type 2 diabetes mellitus with diabetic polyneuropathy: Secondary | ICD-10-CM | POA: Diagnosis not present

## 2021-04-25 DIAGNOSIS — B351 Tinea unguium: Secondary | ICD-10-CM | POA: Diagnosis not present

## 2021-04-28 DIAGNOSIS — Z794 Long term (current) use of insulin: Secondary | ICD-10-CM | POA: Diagnosis not present

## 2021-04-28 DIAGNOSIS — E1122 Type 2 diabetes mellitus with diabetic chronic kidney disease: Secondary | ICD-10-CM | POA: Diagnosis not present

## 2021-04-28 DIAGNOSIS — N182 Chronic kidney disease, stage 2 (mild): Secondary | ICD-10-CM | POA: Diagnosis not present

## 2021-04-29 LAB — COMPREHENSIVE METABOLIC PANEL
ALT: 30 IU/L (ref 0–32)
AST: 28 IU/L (ref 0–40)
Albumin/Globulin Ratio: 1.6 (ref 1.2–2.2)
Albumin: 4.4 g/dL (ref 3.6–4.6)
Alkaline Phosphatase: 108 IU/L (ref 44–121)
BUN/Creatinine Ratio: 24 (ref 12–28)
BUN: 32 mg/dL — ABNORMAL HIGH (ref 8–27)
Bilirubin Total: 0.3 mg/dL (ref 0.0–1.2)
CO2: 22 mmol/L (ref 20–29)
Calcium: 9.8 mg/dL (ref 8.7–10.3)
Chloride: 105 mmol/L (ref 96–106)
Creatinine, Ser: 1.36 mg/dL — ABNORMAL HIGH (ref 0.57–1.00)
Globulin, Total: 2.7 g/dL (ref 1.5–4.5)
Glucose: 110 mg/dL — ABNORMAL HIGH (ref 65–99)
Potassium: 4.5 mmol/L (ref 3.5–5.2)
Sodium: 142 mmol/L (ref 134–144)
Total Protein: 7.1 g/dL (ref 6.0–8.5)
eGFR: 39 mL/min/{1.73_m2} — ABNORMAL LOW (ref >59)

## 2021-04-29 LAB — T4, FREE: Free T4: 0.75 ng/dL — ABNORMAL LOW (ref 0.82–1.77)

## 2021-04-29 LAB — TSH: TSH: 7.64 u[IU]/mL — ABNORMAL HIGH (ref 0.450–4.500)

## 2021-05-04 ENCOUNTER — Ambulatory Visit (INDEPENDENT_AMBULATORY_CARE_PROVIDER_SITE_OTHER): Payer: Medicare Other | Admitting: Nurse Practitioner

## 2021-05-04 ENCOUNTER — Encounter: Payer: Self-pay | Admitting: Nurse Practitioner

## 2021-05-04 VITALS — BP 148/71 | HR 63 | Ht 67.0 in | Wt 206.0 lb

## 2021-05-04 DIAGNOSIS — R7989 Other specified abnormal findings of blood chemistry: Secondary | ICD-10-CM | POA: Diagnosis not present

## 2021-05-04 DIAGNOSIS — Z794 Long term (current) use of insulin: Secondary | ICD-10-CM | POA: Diagnosis not present

## 2021-05-04 DIAGNOSIS — E782 Mixed hyperlipidemia: Secondary | ICD-10-CM | POA: Diagnosis not present

## 2021-05-04 DIAGNOSIS — N182 Chronic kidney disease, stage 2 (mild): Secondary | ICD-10-CM | POA: Diagnosis not present

## 2021-05-04 DIAGNOSIS — E1122 Type 2 diabetes mellitus with diabetic chronic kidney disease: Secondary | ICD-10-CM | POA: Diagnosis not present

## 2021-05-04 DIAGNOSIS — I10 Essential (primary) hypertension: Secondary | ICD-10-CM

## 2021-05-04 DIAGNOSIS — E039 Hypothyroidism, unspecified: Secondary | ICD-10-CM

## 2021-05-04 LAB — POCT GLYCOSYLATED HEMOGLOBIN (HGB A1C): Hemoglobin A1C: 7.6 % — AB (ref 4.0–5.6)

## 2021-05-04 MED ORDER — BASAGLAR KWIKPEN 100 UNIT/ML ~~LOC~~ SOPN: 38.0000 [IU] | PEN_INJECTOR | Freq: Every day | SUBCUTANEOUS | 3 refills | Status: DC

## 2021-05-04 NOTE — Progress Notes (Signed)
05/04/2021, 12:32 PM  Endocrinology follow-up note  Subjective:    Patient ID: Kimberly Freeman, female    DOB: 05-Nov-1940.  Kimberly Freeman is being seen in consultation for management of currently uncontrolled symptomatic diabetes requested by  [name], MD.   Past Medical History:  Diagnosis Date   Arthritis    CAD (coronary artery disease) 08/2019   60% RI with other 20-30% lesions at cath   Diabetes mellitus, type 2 (HCC)    Essential hypertension    GERD (gastroesophageal reflux disease)    Hyperlipidemia    LVH (left ventricular hypertrophy)     Past Surgical History:  Procedure Laterality Date   ABDOMINAL HYSTERECTOMY     BIOPSY  07/18/2017   Procedure: BIOPSY;  Surgeon: [name], MD;  Location: [facility];  Service: Endoscopy;;  gastric   CHOLECYSTECTOMY  2004   COLONOSCOPY N/A 07/18/2017   Dr. [name]: Internal and external hemorrhoids.  No future screening/surveillance colonoscopies due to age.   ESOPHAGOGASTRODUODENOSCOPY N/A 07/18/2017   Dr. [name]: Small hiatal hernia, patchy mild inflammation characterized by congestion, erosions, erythema in the cardia, gastric body and antrum.  Diffuse moderate inflammation in the duodenal bulb and second portion duodenum.  Biopsies benign.  No H. pylori.   LEFT HEART CATH AND CORONARY ANGIOGRAPHY N/A 09/21/2019   Procedure: LEFT HEART CATH AND CORONARY ANGIOGRAPHY;  Surgeon: [name], MD;  Location: [facility];  Service: Cardiovascular;  Laterality: N/A;    Social History   Socioeconomic History   Marital status: Married    Spouse name: [name]    Number of children: 0   Years of education: 12+   Highest education level: 12th grade  Occupational History   Occupation: retired   Tobacco Use   Smoking status: Never   Smokeless tobacco: Never  Vaping Use   Vaping Use: Never used  Substance and Sexual Activity    Alcohol use: No    Alcohol/week: 0.0 standard drinks   Drug use: No   Sexual activity: Not on file  Other Topics Concern   Not on file  Social History Narrative   Lives alone with husband    Social Determinants of Health   Financial Resource Strain: Low Risk    Difficulty of Paying Living Expenses: Not hard at all  Food Insecurity: No Food Insecurity   Worried About Programme researcher, broadcasting/film/video in the Last Year: Never true   Ran Out of Food in the Last Year: Never true  Transportation Needs: No Transportation Needs   Lack of Transportation (Medical): No   Lack of Transportation (Non-Medical): No  Physical Activity: Insufficiently Active   Days of Exercise per Week: 3 days   Minutes of Exercise per Session: 30 min  Stress: No Stress Concern Present   Feeling of Stress : Not at all  Social Connections: Socially Integrated   Frequency of Communication with Friends and Family: More than three times a week   Frequency of Social Gatherings with Friends and Family: More than three times a week   Attends Religious Services: More than 4 times per year   Active Member of [employer]  or Organizations: Yes   Attends Engineer, structural: More than 4 times per year   Marital Status: Married    Family History  Problem Relation Age of Onset   Leukemia Mother    Cancer Mother    Diabetes Mother    Prostate cancer Father    Lung cancer Sister 55   Diabetes Sister    Colon cancer Neg Hx     Outpatient Encounter Medications as of 05/04/2021  Medication Sig   acetaminophen (TYLENOL) 500 MG tablet Take 500 mg by mouth daily as needed for mild pain or moderate pain. For pain   amLODipine (NORVASC) 2.5 MG tablet TAKE 1 TABLET DAILY (ADD AMLODIPINE 2.5 MG TABLET DAILY TO CURRENT 5 MG TABLET DAILY SO TOTAL DOSE IS 7.5 MG DAILY)   amLODipine (NORVASC) 5 MG tablet TAKE 1 TABLET DAILY (DOSE INCREASE EFFECTIVE 07/12/2020)   aspirin 81 MG chewable tablet Chew 1 tablet (81 mg total) by mouth daily.    atorvastatin (LIPITOR) 80 MG tablet TAKE 1 TABLET DAILY AT 6 P.M.   betamethasone dipropionate 0.05 % cream Apply topically 2 (two) times daily.   calcium-vitamin D (OSCAL WITH D) 500-200 MG-UNIT tablet Take 1 tablet by mouth.   Easy Touch Safety Lancets 28G MISC Twice daily testing dx E11.65   glipiZIDE (GLIPIZIDE XL) 2.5 MG 24 hr tablet Take 1 tablet (2.5 mg total) by mouth daily with breakfast.   glucose blood (ONE TOUCH ULTRA TEST) test strip USE AS INSTRUCTED FOR TWICE DAILY TESTING   Insulin Glargine (BASAGLAR KWIKPEN) 100 UNIT/ML Inject 38 Units into the skin at bedtime.   Insulin Pen Needle (BD PEN NEEDLE MICRO U/F) 32G X 6 MM MISC USE TO INJECT INSULIN DAILY   irbesartan (AVAPRO) 300 MG tablet Take 1 tablet (300 mg total) by mouth daily.   Lancets (ONETOUCH ULTRASOFT) lancets USE AS DIRECTED TWICE DAILY   metoprolol tartrate (LOPRESSOR) 100 MG tablet TAKE 1 TABLET TWICE A DAY   Multiple Vitamin (MULTIVITAMIN WITH MINERALS) TABS tablet Take 1 tablet by mouth daily.   pantoprazole (PROTONIX) 40 MG tablet TAKE 1 TABLET BY MOUTH 30 MINUTES BEFORE FIRST MEAL   polyethylene glycol (MIRALAX / GLYCOLAX) 17 g packet Take 17 g by mouth daily as needed.   spironolactone (ALDACTONE) 50 MG tablet Take 1 tablet (50 mg total) by mouth daily.   vitamin B-12 (CYANOCOBALAMIN) 500 MCG tablet Take 500 mcg by mouth daily.   [DISCONTINUED] Insulin Glargine (BASAGLAR KWIKPEN) 100 UNIT/ML Inject 45 Units into the skin at bedtime.   No facility-administered encounter medications on file as of 05/04/2021.    ALLERGIES: Allergies  Allergen Reactions   Poison Oak Extract [Poison Oak Extract] Hives   Sulfonamide Derivatives Itching    Burning sensation all over body    VACCINATION STATUS: Immunization History  Administered Date(s) Administered   Fluad Quad(high Dose 65+) 07/27/2019, 06/23/2020   Influenza Split 08/01/2011, 07/31/2012   Influenza Whole 07/29/2007, 07/20/2008, 07/21/2009, 07/19/2010    Influenza, High Dose Seasonal PF 07/23/2018   Influenza,inj,Quad PF,6+ Mos 08/07/2013, 06/15/2014, 07/19/2015, 06/28/2016, 07/25/2017   Moderna Sars-Covid-2 Vaccination 12/22/2019, 01/19/2020, 09/25/2020   Pneumococcal Conjugate-13 10/12/2014   Pneumococcal Polysaccharide-23 03/16/2004, 08/29/2012   Td 03/16/2004   Tdap 03/31/2012   Zoster, Live 01/02/2007    Diabetes She presents for her follow-up diabetic visit. She has type 2 diabetes mellitus. Onset time: She was diagnosed at approximate age of 2 years. Her disease course has been stable. Hypoglycemia symptoms include nervousness/anxiousness and tremors.  Pertinent negatives for hypoglycemia include no confusion, headaches, pallor or seizures. Associated symptoms include fatigue. Pertinent negatives for diabetes include no chest pain, no polydipsia, no polyphagia and no polyuria. There are no hypoglycemic complications. Symptoms are stable. Diabetic complications include heart disease and nephropathy. Risk factors for coronary artery disease include diabetes mellitus, dyslipidemia, family history, hypertension, obesity, sedentary lifestyle and post-menopausal. Current diabetic treatment includes insulin injections and oral agent (monotherapy). She is compliant with treatment most of the time. Her weight is fluctuating minimally. She is following a generally healthy diet. When asked about meal planning, she reported none. She has had a previous visit with a dietitian. She participates in exercise intermittently. Her home blood glucose trend is decreasing steadily. Her breakfast blood glucose range is generally 70-90 mg/dl. Her bedtime blood glucose range is generally 110-130 mg/dl. (She presents today with her meter and logs showing tight fasting and at goal postprandial glucose readings.  Her POCT A1c today is 7.6%, essentially unchanged from previous visit.  She does report some nocturnal hypoglycemia.) An ACE inhibitor/angiotensin II receptor  blocker is not being taken. She sees a podiatrist.Eye exam is current.  Hyperlipidemia This is a chronic problem. The current episode started more than 1 year ago. The problem is controlled. Recent lipid tests were reviewed and are normal. Exacerbating diseases include chronic renal disease, diabetes and obesity. Factors aggravating her hyperlipidemia include beta blockers. Pertinent negatives include no chest pain, myalgias or shortness of breath. Current antihyperlipidemic treatment includes statins. The current treatment provides mild improvement of lipids. There are no compliance problems.  Risk factors for coronary artery disease include diabetes mellitus, dyslipidemia, obesity, hypertension, a sedentary lifestyle and post-menopausal.  Hypertension This is a chronic problem. The current episode started more than 1 year ago. The problem is unchanged. The problem is controlled. Pertinent negatives include no chest pain, headaches, palpitations or shortness of breath. There are no associated agents to hypertension. Risk factors for coronary artery disease include sedentary lifestyle, obesity, post-menopausal state, diabetes mellitus and dyslipidemia. Past treatments include calcium channel blockers, beta blockers and diuretics. The current treatment provides mild improvement. There are no compliance problems.  Hypertensive end-organ damage includes kidney disease and CAD/MI. Identifiable causes of hypertension include chronic renal disease.   Review of systems  Constitutional: + Minimally fluctuating body weight,  current Body mass index is 32.26 kg/m. , no fatigue, no subjective hyperthermia, no subjective hypothermia Eyes: no blurry vision, no xerophthalmia ENT: no sore throat, no nodules palpated in throat, no dysphagia/odynophagia, no hoarseness Cardiovascular: no chest pain, no shortness of breath, no palpitations, no leg swelling Respiratory: no cough, no shortness of breath Gastrointestinal:  no nausea/vomiting/diarrhea Musculoskeletal: no muscle/joint aches Skin: no rashes, no hyperemia Neurological: no tremors, no numbness, no tingling, no dizziness Psychiatric: no depression, no anxiety   Objective:    BP (!) 148/71   Pulse 63   Ht 5\' 7"  (1.702 m)   Wt 206 lb (93.4 kg)   BMI 32.26 kg/m   Wt Readings from Last 3 Encounters:  05/04/21 206 lb (93.4 kg)  02/08/21 207 lb (93.9 kg)  12/21/20 199 lb (90.3 kg)    BP Readings from Last 3 Encounters:  05/04/21 (!) 148/71  02/08/21 (!) 144/60  12/21/20 (!) 168/78    Review of systems  Constitutional: + Minimally fluctuating body weight,  current Body mass index is 32.26 kg/m. , no fatigue, no subjective hyperthermia, no subjective hypothermia Eyes: no blurry vision, no xerophthalmia ENT: no sore throat, no nodules  palpated in throat, no dysphagia/odynophagia, no hoarseness Cardiovascular: no chest pain, no shortness of breath, no palpitations, no leg swelling Respiratory: no cough, no shortness of breath Gastrointestinal: no nausea/vomiting/diarrhea Musculoskeletal: no muscle/joint aches Skin: no rashes, no hyperemia Neurological: no tremors, no numbness, no tingling, no dizziness Psychiatric: no depression, no anxiety    CMP     Component Value Date/Time   NA 142 04/28/2021 0835   K 4.5 04/28/2021 0835   CL 105 04/28/2021 0835   CO2 22 04/28/2021 0835   GLUCOSE 110 (H) 04/28/2021 0835   GLUCOSE 201 (H) 01/25/2021 1316   BUN 32 (H) 04/28/2021 0835   CREATININE 1.36 (H) 04/28/2021 0835   CREATININE 1.14 (H) 12/15/2019 1048   CALCIUM 9.8 04/28/2021 0835   PROT 7.1 04/28/2021 0835   ALBUMIN 4.4 04/28/2021 0835   AST 28 04/28/2021 0835   ALT 30 04/28/2021 0835   ALKPHOS 108 04/28/2021 0835   BILITOT 0.3 04/28/2021 0835   GFRNONAA 43 (L) 01/25/2021 1316   GFRNONAA 45 (L) 12/15/2019 1048   GFRAA 48 (L) 09/05/2020 0810   GFRAA 53 (L) 12/15/2019 1048     Diabetic Labs (most recent): Lab Results   Component Value Date   HGBA1C 7.6 (A) 05/04/2021   HGBA1C 7.5 (A) 12/21/2020   HGBA1C 9.2 (A) 09/01/2020     Lipid Panel ( most recent) Lipid Panel     Component Value Date/Time   CHOL 156 09/05/2020 0810   TRIG 106 09/05/2020 0810   HDL 46 09/05/2020 0810   CHOLHDL 3.4 09/05/2020 0810   CHOLHDL 3.3 12/15/2019 1048   VLDL 21 03/22/2017 0803   LDLCALC 91 09/05/2020 0810   LDLCALC 80 12/15/2019 1048   LABVLDL 19 09/05/2020 0810      Lab Results  Component Value Date   TSH 7.640 (H) 04/28/2021   TSH 4.360 05/23/2020   TSH 6.85 (H) 07/24/2019   TSH 5.19 (H) 08/04/2018   TSH 3.81 04/10/2016   TSH 2.410 12/13/2015   TSH 5.289 (H) 11/14/2015   TSH 3.963 06/14/2014   TSH 3.427 06/03/2013   TSH 3.333 01/22/2012   FREET4 0.75 (L) 04/28/2021   FREET4 1.0 12/15/2019   FREET4 0.8 07/24/2019   FREET4 0.9 08/04/2018   FREET4 0.78 12/13/2015   FREET4 0.84 11/14/2015       Assessment & Plan:   1) Type 2 diabetes mellitus with stage 2 chronic kidney disease, with long-term current use of insulin (HCC)  - Kimberly Freeman has currently uncontrolled symptomatic type 2 DM since  80 years of age. Recent labs reviewed.  She presents today with her meter and logs showing tight fasting and at goal postprandial glucose readings.  Her POCT A1c today is 7.6%, essentially unchanged from previous visit.  She does report some nocturnal hypoglycemia.  - I had a long discussion with her about the progressive nature of diabetes and the pathology behind its complications. -her diabetes is complicated by stage 2 CKD, obesity /sedentary life, and she remains at a high risk for more acute and chronic complications which include CAD, CVA, CKD, retinopathy, and neuropathy. These are all discussed in detail with her.  - Nutritional counseling repeated at each appointment due to patients tendency to fall back in to old habits.  - The patient admits there is a room for improvement in their diet and  drink choices. -  Suggestion is made for the patient to avoid simple carbohydrates from their diet including Cakes, Sweet Desserts / Pastries,  Ice Cream, Soda (diet and regular), Sweet Tea, Candies, Chips, Cookies, Sweet Pastries, Store Bought Juices, Alcohol in Excess of 1-2 drinks a day, Artificial Sweeteners, Coffee Creamer, and "Sugar-free" Products. This will help patient to have stable blood glucose profile and potentially avoid unintended weight gain.   - I encouraged the patient to switch to unprocessed or minimally processed complex starch and increased protein intake (animal or plant source), fruits, and vegetables.   - Patient is advised to stick to a routine mealtimes to eat 3 meals a day and avoid unnecessary snacks (to snack only to correct hypoglycemia).  - I have approached her with the following individualized plan to manage  her diabetes and patient agrees:   - she will continue to need at least basal insulin in order for her to achieve control of diabetes to target.    - Based on her tight fasting glycemic profile, she will tolerate slight reduction of her Basaglar to 38 units SQ nightly.  She is advised to continue Glipizide 2.5 mg XL daily with breakfast.    -She is encouraged to continue monitoring glucose twice daily, before breakfast and before bed, and to call the clinic if she has readings less than 70 or greater than 300 for 3 tests in a row.  -She will be considered for incretin therapy during her next visit.  - Specific targets for  A1c;  LDL, HDL,  and Triglycerides were discussed with the patient.  2) Blood Pressure /Hypertension:  Her blood pressure is controlled to target for her age.  She is advised to continue Norvasc 7.5 mg po daily, Irbesartan 300 mg po daily, Metoprolol 100 mg po twice daily, and Aldactone 50 mg po daily.  3) Lipids/Hyperlipidemia:    Her most recent lipid panel from 09/05/20 shows controlled LDL at 91.  She is advised to continue  Atorvastatin 80 mg po daily at bedtime.  Side effects and precautions discussed with her.  4)  Weight/Diet:  Her Body mass index is 32.26 kg/m.  -   clearly complicating her diabetes care.   she is  a candidate for weight loss. I discussed with her the fact that loss of 5 - 10% of her  current body weight will have the most impact on her diabetes management.  Exercise, and detailed carbohydrates information provided  -  detailed on discharge instructions.  5) Chronic Care/Health Maintenance: -she is on Statin medications and  is encouraged to initiate and continue to follow up with Ophthalmology, Dentist,  Podiatrist at least yearly or according to recommendations, and advised to   stay away from smoking. I have recommended yearly flu vaccine and pneumonia vaccine at least every 5 years; moderate intensity exercise for up to 150 minutes weekly; and  sleep for at least 7 hours a day.  - she is advised to maintain close follow up with [name], MD for primary care needs, as well as her other providers for optimal and coordinated care.    I spent 40 minutes in the care of the patient today including review of labs from CMP, Lipids, Thyroid Function, Hematology (current and previous including abstractions from other facilities); face-to-face time discussing  her blood glucose readings/logs, discussing hypoglycemia and hyperglycemia episodes and symptoms, medications doses, her options of short and long term treatment based on the latest standards of care / guidelines;  discussion about incorporating lifestyle medicine;  and documenting the encounter.    Please refer to Patient Instructions for Blood Glucose Monitoring  and Insulin/Medications Dosing Guide"  in media tab for additional information. Please  also refer to " Patient Self Inventory" in the Media  tab for reviewed elements of pertinent patient history.  Mauro Kaufmann participated in the discussions, expressed understanding, and  voiced agreement with the above plans.  All questions were answered to her satisfaction. she is encouraged to contact clinic should she have any questions or concerns prior to her return visit.   Follow up plan: - Return in about 4 months (around 09/04/2021) for Diabetes F/U with A1c in office, Thyroid follow up, Previsit labs, Bring meter and logs.  Ronny Bacon, [facility] [address] [place], [place] [address] Phone: [phone] Fax: [phone]  05/04/2021, 12:32 PM

## 2021-05-04 NOTE — Patient Instructions (Signed)

## 2021-06-08 ENCOUNTER — Telehealth: Payer: Self-pay

## 2021-06-08 NOTE — Telephone Encounter (Signed)
Huntley Dec from [facility] called the [employer] Pen is not covered by her insurance.  The other alternative will be: Levemir flextouch pen 3mg  - 5's for 100 units ml 2. Tresiba flex 3 mg - 5's 100 units ml  This must be filled by end of today, patient is out of this.  Call back # [phone] Refence # [phone]

## 2021-06-09 ENCOUNTER — Telehealth: Payer: Self-pay

## 2021-06-09 MED ORDER — INSULIN DEGLUDEC 100 UNIT/ML ~~LOC~~ SOPN: 38.0000 [IU] | PEN_INJECTOR | Freq: Every day | SUBCUTANEOUS | 0 refills | Status: DC

## 2021-06-09 NOTE — Telephone Encounter (Signed)
Pt informed

## 2021-06-09 NOTE — Telephone Encounter (Signed)
Yes maam.  Its ok to change

## 2021-06-09 NOTE — Telephone Encounter (Signed)
Patient called ask that the nurse return her call about the change of her insulin to [place]. Call back # [phone].

## 2021-06-09 NOTE — Telephone Encounter (Signed)
Pt is needing a refill on her basaglar but the insurance will no longer cover. She says they will cover [place]. Ok to change?

## 2021-06-12 NOTE — Telephone Encounter (Signed)
Aware that endo prescribes the insulin medications

## 2021-06-22 ENCOUNTER — Encounter: Payer: Self-pay | Admitting: Orthopaedic Surgery

## 2021-06-22 ENCOUNTER — Other Ambulatory Visit: Payer: Self-pay

## 2021-06-22 ENCOUNTER — Ambulatory Visit (INDEPENDENT_AMBULATORY_CARE_PROVIDER_SITE_OTHER): Payer: Medicare Other | Admitting: Orthopaedic Surgery

## 2021-06-22 DIAGNOSIS — G8929 Other chronic pain: Secondary | ICD-10-CM

## 2021-06-22 DIAGNOSIS — M25561 Pain in right knee: Secondary | ICD-10-CM

## 2021-06-22 NOTE — Progress Notes (Signed)
PROCEDURE NOTE:  The patient requests injections of the right knee , verbal consent was obtained.  The right knee was prepped appropriately after time out was performed.   Sterile technique was observed and injection of 1 cc of Celestone 6mg  with several cc's of plain xylocaine. Anesthesia was provided by ethyl chloride and a 20-gauge needle was used to inject the knee area. The injection was tolerated well.  A band aid dressing was applied.  The patient was advised to apply ice later today and tomorrow to the injection sight as needed.   Return as needed.  Call if any problem.  Precautions discussed.  Electronically Signed , MD 9/1/20229:22 AM

## 2021-06-29 ENCOUNTER — Other Ambulatory Visit (HOSPITAL_COMMUNITY): Payer: Self-pay | Admitting: Family Medicine

## 2021-06-29 ENCOUNTER — Ambulatory Visit: Payer: Medicare Other | Admitting: Family Medicine

## 2021-06-29 ENCOUNTER — Encounter: Payer: Self-pay | Admitting: Family Medicine

## 2021-06-29 ENCOUNTER — Other Ambulatory Visit: Payer: Self-pay

## 2021-06-29 VITALS — BP 143/65 | HR 58 | Resp 16 | Ht 67.0 in | Wt 206.0 lb

## 2021-06-29 DIAGNOSIS — I10 Essential (primary) hypertension: Secondary | ICD-10-CM | POA: Diagnosis not present

## 2021-06-29 DIAGNOSIS — Z794 Long term (current) use of insulin: Secondary | ICD-10-CM

## 2021-06-29 DIAGNOSIS — E785 Hyperlipidemia, unspecified: Secondary | ICD-10-CM

## 2021-06-29 DIAGNOSIS — Z23 Encounter for immunization: Secondary | ICD-10-CM | POA: Diagnosis not present

## 2021-06-29 DIAGNOSIS — E1122 Type 2 diabetes mellitus with diabetic chronic kidney disease: Secondary | ICD-10-CM | POA: Diagnosis not present

## 2021-06-29 DIAGNOSIS — E669 Obesity, unspecified: Secondary | ICD-10-CM

## 2021-06-29 DIAGNOSIS — Z1231 Encounter for screening mammogram for malignant neoplasm of breast: Secondary | ICD-10-CM

## 2021-06-29 DIAGNOSIS — N182 Chronic kidney disease, stage 2 (mild): Secondary | ICD-10-CM

## 2021-06-29 DIAGNOSIS — I25119 Atherosclerotic heart disease of native coronary artery with unspecified angina pectoris: Secondary | ICD-10-CM

## 2021-06-29 NOTE — Patient Instructions (Addendum)
Follow-up in office with MD in 6 months call if you need me sooner.  Flu vaccine in office today.  Please get COVID booster as well as Shingrix vaccines at your pharmacy as soon as possible.  Please work on 6 to 8 pound weight loss in the next 6 months this will improve your blood sugar and blood pressure.  Labs as soon as possible lipid panel Chem-7 and EGFR   Please commit to 64 ounces of water or fluid every day as best able.  Please stop  vitamin B12 your numbers are twice normal range.    Mammogram to be scheduled for November 2 or after at checkout.  Congrats on excellent blood sugar keep it up.  Please continue your daily exercise commitment for 30 minutes at least 5 days/week.

## 2021-06-29 NOTE — Progress Notes (Signed)
Kimberly Freeman     MRN: [id number]      DOB: [birth year]   HPI Kimberly Freeman is here for follow up and re-evaluation of chronic medical conditions, medication management and review of any available recent lab and radiology data.  Preventive health is updated, specifically  Cancer screening and Immunization.   Questions or concerns regarding consultations or procedures which the PT has had in the interim are  addressed. The PT denies any adverse reactions to current medications since the last visit.  Denies polyuria, polydipsia, blurred vision , or hypoglycemic episodes.  ROS Denies recent fever or chills. Denies sinus pressure, nasal congestion, ear pain or sore throat. Denies chest congestion, productive cough or wheezing. Denies chest pains, palpitations and leg swelling Denies abdominal pain, nausea, vomiting,diarrhea or constipation.   Denies dysuria, frequency, hesitancy or incontinence. Denies uncontrolled  joint pain,  and limitation in mobility. Denies headaches, seizures, numbness, or tingling. Denies depression, anxiety or insomnia. Denies skin break down or rash.   PE  BP (!) 143/65   Pulse (!) 58   Resp 16   Ht 5\' 7"  (1.702 m)   Wt 206 lb (93.4 kg)   SpO2 94%   BMI 32.26 kg/m   Patient alert and oriented and in no cardiopulmonary distress.  HEENT: No facial asymmetry, EOMI,     Neck supple .  Chest: Clear to auscultation bilaterally.  CVS: S1, S2 no murmurs, no S3.Regular rate.  ABD: Soft non tender.   Ext: No edema  MS: decreased  ROM spine, shoulders, hips and knees.  Skin: Intact, no ulcerations or rash noted.  Psych: Good eye contact, normal affect. Memory intact not anxious or depressed appearing.  CNS: CN 2-12 intact, power,  normal throughout.no focal deficits noted.   Assessment & Plan  Essential hypertension Elevated and not at goal DASH diet and commitment to daily physical activity for a minimum of 30 minutes discussed and encouraged,  as a part of hypertension management. The importance of attaining a healthy weight is also discussed.  BP/Weight 06/29/2021 05/04/2021 02/08/2021 12/21/2020 12/21/2020 12/08/2020 10/04/2020  Systolic BP 143 148 144 - 168 10/06/2020 -  Diastolic BP 65 71 60 - 78 78 -  Wt. (Lbs) 206 206 207 199 199.4 196 200  BMI 32.26 32.26 32.42 31.17 31.23 30.7 31.32       Obesity (BMI 30-39.9)  Patient re-educated about  the importance of commitment to a  minimum of 150 minutes of exercise per week as able.  The importance of healthy food choices with portion control discussed, as well as eating regularly and within a 12 hour window most days. The need to choose "clean , green" food 50 to 75% of the time is discussed, as well as to make water the primary drink and set a goal of 64 ounces water daily.    Weight /BMI 06/29/2021 05/04/2021 02/08/2021  WEIGHT 206 lb 206 lb 207 lb  HEIGHT 5\' 7"  5\' 7"  5\' 7"   BMI 32.26 kg/m2 32.26 kg/m2 32.42 kg/m2      Type 2 diabetes mellitus with stage 2 chronic kidney disease, with long-term current use of insulin (HCC) Kimberly Freeman is reminded of the importance of commitment to daily physical activity for 30 minutes or more, as able and the need to limit carbohydrate intake to 30 to 60 grams per meal to help with blood sugar control.   Kimberly Freeman is reminded of the importance of daily foot exam, annual eye examination, and good  blood sugar, blood pressure and cholesterol control.  Not at goal, managed by Endo, work on diet  Diabetic Labs Latest Ref Rng & Units 06/30/2021 05/04/2021 04/28/2021 01/25/2021 12/21/2020  HbA1c 4.0 - 5.6 % - 7.6(A) - - 7.5(A)  Microalbumin mg/dL - - - - -  Micro/Creat Ratio <30 mcg/mg creat - - - - -  Chol 100 - 199 mg/dL 696 - - - -  HDL >78 mg/dL 47 - - - -  Calc LDL 0 - 99 mg/dL 90 - - - -  Triglycerides 0 - 149 mg/dL 938 - - - -  Creatinine 0.57 - 1.00 mg/dL 1.01(B) - 5.10(C) 5.85(I) -   BP/Weight 06/29/2021 05/04/2021 02/08/2021 12/21/2020 12/21/2020  12/08/2020 10/04/2020  Systolic BP 143 148 144 - 168 778 -  Diastolic BP 65 71 60 - 78 78 -  Wt. (Lbs) 206 206 207 199 199.4 196 200  BMI 32.26 32.26 32.42 31.17 31.23 30.7 31.32   Foot/eye exam completion dates Latest Ref Rng & Units 03/01/2020 12/15/2019  Eye Exam No Retinopathy No Retinopathy -  Foot exam Order - - -  Foot Form Completion - - Done         Dyslipidemia, goal LDL below 70 Hyperlipidemia:Low fat diet discussed and encouraged.   Lipid Panel  Lab Results  Component Value Date   CHOL 156 06/30/2021   HDL 47 06/30/2021   LDLCALC 90 06/30/2021   TRIG 106 06/30/2021   CHOLHDL 3.3 06/30/2021

## 2021-06-30 DIAGNOSIS — I10 Essential (primary) hypertension: Secondary | ICD-10-CM | POA: Diagnosis not present

## 2021-06-30 DIAGNOSIS — E785 Hyperlipidemia, unspecified: Secondary | ICD-10-CM | POA: Diagnosis not present

## 2021-07-01 LAB — LIPID PANEL
Chol/HDL Ratio: 3.3 ratio (ref 0.0–4.4)
Cholesterol, Total: 156 mg/dL (ref 100–199)
HDL: 47 mg/dL (ref >39)
LDL Chol Calc (NIH): 90 mg/dL (ref 0–99)
Triglycerides: 106 mg/dL (ref 0–149)
VLDL Cholesterol Cal: 19 mg/dL (ref 5–40)

## 2021-07-01 LAB — CMP14+EGFR
ALT: 29 IU/L (ref 0–32)
AST: 22 IU/L (ref 0–40)
Albumin/Globulin Ratio: 1.9 (ref 1.2–2.2)
Albumin: 4.5 g/dL (ref 3.6–4.6)
Alkaline Phosphatase: 94 IU/L (ref 44–121)
BUN/Creatinine Ratio: 18 (ref 12–28)
BUN: 25 mg/dL (ref 8–27)
Bilirubin Total: 0.3 mg/dL (ref 0.0–1.2)
CO2: 25 mmol/L (ref 20–29)
Calcium: 9.9 mg/dL (ref 8.7–10.3)
Chloride: 105 mmol/L (ref 96–106)
Creatinine, Ser: 1.37 mg/dL — ABNORMAL HIGH (ref 0.57–1.00)
Globulin, Total: 2.4 g/dL (ref 1.5–4.5)
Glucose: 84 mg/dL (ref 65–99)
Potassium: 4.9 mmol/L (ref 3.5–5.2)
Sodium: 144 mmol/L (ref 134–144)
Total Protein: 6.9 g/dL (ref 6.0–8.5)
eGFR: 39 mL/min/{1.73_m2} — ABNORMAL LOW (ref >59)

## 2021-07-02 ENCOUNTER — Encounter: Payer: Self-pay | Admitting: Family Medicine

## 2021-07-02 NOTE — Assessment & Plan Note (Signed)
Hyperlipidemia:Low fat diet discussed and encouraged.   Lipid Panel  Lab Results  Component Value Date   CHOL 156 06/30/2021   HDL 47 06/30/2021   LDLCALC 90 06/30/2021   TRIG 106 06/30/2021   CHOLHDL 3.3 06/30/2021

## 2021-07-02 NOTE — Assessment & Plan Note (Signed)
Elevated and not at goal DASH diet and commitment to daily physical activity for a minimum of 30 minutes discussed and encouraged, as a part of hypertension management. The importance of attaining a healthy weight is also discussed.  BP/Weight 06/29/2021 05/04/2021 02/08/2021 12/21/2020 12/21/2020 12/08/2020 10/04/2020  Systolic BP 143 148 144 - 168 929 -  Diastolic BP 65 71 60 - 78 78 -  Wt. (Lbs) 206 206 207 199 199.4 196 200  BMI 32.26 32.26 32.42 31.17 31.23 30.7 31.32

## 2021-07-02 NOTE — Assessment & Plan Note (Signed)
  Patient re-educated about  the importance of commitment to a  minimum of 150 minutes of exercise per week as able.  The importance of healthy food choices with portion control discussed, as well as eating regularly and within a 12 hour window most days. The need to choose "clean , green" food 50 to 75% of the time is discussed, as well as to make water the primary drink and set a goal of 64 ounces water daily.    Weight /BMI 06/29/2021 05/04/2021 02/08/2021  WEIGHT 206 lb 206 lb 207 lb  HEIGHT 5\' 7"  5\' 7"  5\' 7"   BMI 32.26 kg/m2 32.26 kg/m2 32.42 kg/m2

## 2021-07-02 NOTE — Assessment & Plan Note (Signed)
Kimberly Freeman is reminded of the importance of commitment to daily physical activity for 30 minutes or more, as able and the need to limit carbohydrate intake to 30 to 60 grams per meal to help with blood sugar control.   Kimberly Freeman is reminded of the importance of daily foot exam, annual eye examination, and good blood sugar, blood pressure and cholesterol control.  Not at goal, managed by Endo, work on diet  Diabetic Labs Latest Ref Rng & Units 06/30/2021 05/04/2021 04/28/2021 01/25/2021 12/21/2020  HbA1c 4.0 - 5.6 % - 7.6(A) - - 7.5(A)  Microalbumin mg/dL - - - - -  Micro/Creat Ratio <30 mcg/mg creat - - - - -  Chol 100 - 199 mg/dL 621 - - - -  HDL >30 mg/dL 47 - - - -  Calc LDL 0 - 99 mg/dL 90 - - - -  Triglycerides 0 - 149 mg/dL 865 - - - -  Creatinine 0.57 - 1.00 mg/dL 7.84(O) - 9.62(X) 5.28(U) -   BP/Weight 06/29/2021 05/04/2021 02/08/2021 12/21/2020 12/21/2020 12/08/2020 10/04/2020  Systolic BP 143 148 144 - 168 132 -  Diastolic BP 65 71 60 - 78 78 -  Wt. (Lbs) 206 206 207 199 199.4 196 200  BMI 32.26 32.26 32.42 31.17 31.23 30.7 31.32   Foot/eye exam completion dates Latest Ref Rng & Units 03/01/2020 12/15/2019  Eye Exam No Retinopathy No Retinopathy -  Foot exam Order - - -  Foot Form Completion - - Done

## 2021-07-04 DIAGNOSIS — E1142 Type 2 diabetes mellitus with diabetic polyneuropathy: Secondary | ICD-10-CM | POA: Diagnosis not present

## 2021-07-04 DIAGNOSIS — B351 Tinea unguium: Secondary | ICD-10-CM | POA: Diagnosis not present

## 2021-07-26 DIAGNOSIS — Z23 Encounter for immunization: Secondary | ICD-10-CM | POA: Diagnosis not present

## 2021-07-28 ENCOUNTER — Other Ambulatory Visit: Payer: Self-pay | Admitting: Family Medicine

## 2021-08-02 ENCOUNTER — Inpatient Hospital Stay (HOSPITAL_COMMUNITY): Payer: Medicare Other | Attending: Hematology

## 2021-08-02 ENCOUNTER — Other Ambulatory Visit: Payer: Self-pay

## 2021-08-02 DIAGNOSIS — D500 Iron deficiency anemia secondary to blood loss (chronic): Secondary | ICD-10-CM

## 2021-08-02 DIAGNOSIS — N183 Chronic kidney disease, stage 3 unspecified: Secondary | ICD-10-CM | POA: Diagnosis not present

## 2021-08-02 DIAGNOSIS — D631 Anemia in chronic kidney disease: Secondary | ICD-10-CM | POA: Insufficient documentation

## 2021-08-02 LAB — CBC WITH DIFFERENTIAL/PLATELET
Abs Immature Granulocytes: 0.02 10*3/uL (ref 0.00–0.07)
Basophils Absolute: 0 10*3/uL (ref 0.0–0.1)
Basophils Relative: 1 %
Eosinophils Absolute: 0.3 10*3/uL (ref 0.0–0.5)
Eosinophils Relative: 4 %
HCT: 34.8 % — ABNORMAL LOW (ref 36.0–46.0)
Hemoglobin: 11.3 g/dL — ABNORMAL LOW (ref 12.0–15.0)
Immature Granulocytes: 0 %
Lymphocytes Relative: 33 %
Lymphs Abs: 2.7 10*3/uL (ref 0.7–4.0)
MCH: 31.4 pg (ref 26.0–34.0)
MCHC: 32.5 g/dL (ref 30.0–36.0)
MCV: 96.7 fL (ref 80.0–100.0)
Monocytes Absolute: 0.9 10*3/uL (ref 0.1–1.0)
Monocytes Relative: 11 %
Neutro Abs: 4.3 10*3/uL (ref 1.7–7.7)
Neutrophils Relative %: 51 %
Platelets: 210 10*3/uL (ref 150–400)
RBC: 3.6 MIL/uL — ABNORMAL LOW (ref 3.87–5.11)
RDW: 12.7 % (ref 11.5–15.5)
WBC: 8.3 10*3/uL (ref 4.0–10.5)
nRBC: 0 % (ref 0.0–0.2)

## 2021-08-02 LAB — IRON AND TIBC
Iron: 79 ug/dL (ref 28–170)
Saturation Ratios: 24 % (ref 10.4–31.8)
TIBC: 331 ug/dL (ref 250–450)
UIBC: 252 ug/dL

## 2021-08-02 LAB — FERRITIN: Ferritin: 225 ng/mL (ref 11–307)

## 2021-08-03 LAB — ERYTHROPOIETIN: Erythropoietin: 16.2 m[IU]/mL (ref 2.6–18.5)

## 2021-08-04 ENCOUNTER — Other Ambulatory Visit: Payer: Self-pay | Admitting: Family Medicine

## 2021-08-08 NOTE — Progress Notes (Signed)
[place], [place] [address]   CLINIC:  Medical Oncology/Hematology  PCP:  Fayrene Helper, MD [address] [place] [address] [phone]   REASON FOR VISIT:  Follow-up for anemia of iron deficiency and CKD  CURRENT THERAPY: Observation  INTERVAL HISTORY:  [name] 80 y.o. female returns for routine follow-up of her anemia secondary to CKD stage III and iron deficiency.  She was last evaluated by Tarri Abernethy PA-C via telemedicine visit on 02/01/2021.  At today's visit, she reports that she is doing well.  She denies any recent hospitalizations, new diagnoses, or changes in her baseline health status.  She reports that she is no longer taking oral iron or B12 supplements at home.  She has not noted any major blood loss such as hematemesis, hematochezia, or melena.  She denies any fatigue, pica, restless legs, chest pain, dyspnea exertion, lightheadedness, or syncope.  She has no complaints at the time of today's visit.  She has 75% energy and 100% appetite. She endorses that she is maintaining a stable weight.   REVIEW OF SYSTEMS:  Review of Systems  Constitutional:  Negative for appetite change, chills, diaphoresis, fatigue, fever and unexpected weight change.  HENT:   Negative for lump/mass and nosebleeds.   Eyes:  Negative for eye problems.  Respiratory:  Negative for cough, hemoptysis and shortness of breath.   Cardiovascular:  Negative for chest pain, leg swelling and palpitations.  Gastrointestinal:  Negative for abdominal pain, blood in stool, constipation, diarrhea, nausea and vomiting.  Genitourinary:  Negative for hematuria.   Skin: Negative.   Neurological:  Negative for dizziness, headaches and light-headedness.  Hematological:  Does not bruise/bleed easily.     PAST MEDICAL/SURGICAL HISTORY:  Past Medical History:  Diagnosis Date   Arthritis    CAD (coronary artery disease) 08/2019   60% RI  with other 20-30% lesions at cath   Diabetes mellitus, type 2 (HCC)    Essential hypertension    GERD (gastroesophageal reflux disease)    Hyperlipidemia    LVH (left ventricular hypertrophy)    Past Surgical History:  Procedure Laterality Date   ABDOMINAL HYSTERECTOMY     BIOPSY  07/18/2017   Procedure: BIOPSY;  Surgeon: Danie Binder, MD;  Location: [facility];  Service: Endoscopy;;  gastric   CHOLECYSTECTOMY  2004   COLONOSCOPY N/A 07/18/2017   Dr. Oneida Alar: Internal and external hemorrhoids.  No future screening/surveillance colonoscopies due to age.   ESOPHAGOGASTRODUODENOSCOPY N/A 07/18/2017   Dr. Oneida Alar: Small hiatal hernia, patchy mild inflammation characterized by congestion, erosions, erythema in the cardia, gastric body and antrum.  Diffuse moderate inflammation in the duodenal bulb and second portion duodenum.  Biopsies benign.  No H. pylori.   LEFT HEART CATH AND CORONARY ANGIOGRAPHY N/A 09/21/2019   Procedure: LEFT HEART CATH AND CORONARY ANGIOGRAPHY;  Surgeon: Troy Sine, MD;  Location: [facility];  Service: Cardiovascular;  Laterality: N/A;     SOCIAL HISTORY:  Social History   Socioeconomic History   Marital status: Married    Spouse name: Winferd Humphrey    Number of children: 0   Years of education: 12+   Highest education level: 12th grade  Occupational History   Occupation: retired   Tobacco Use   Smoking status: Never   Smokeless tobacco: Never  Vaping Use   Vaping Use: Never used  Substance and Sexual Activity   Alcohol use: No  Alcohol/week: 0.0 standard drinks   Drug use: No   Sexual activity: Not on file  Other Topics Concern   Not on file  Social History Narrative   Lives alone with husband    Social Determinants of Health   Financial Resource Strain: Low Risk    Difficulty of Paying Living Expenses: Not hard at all  Food Insecurity: No Food Insecurity   Worried About Charity fundraiser in the Last Year: Never true   Youth worker in the Last Year: Never true  Transportation Needs: No Transportation Needs   Lack of Transportation (Medical): No   Lack of Transportation (Non-Medical): No  Physical Activity: Insufficiently Active   Days of Exercise per Week: 3 days   Minutes of Exercise per Session: 30 min  Stress: No Stress Concern Present   Feeling of Stress : Not at all  Social Connections: Socially Integrated   Frequency of Communication with Friends and Family: More than three times a week   Frequency of Social Gatherings with Friends and Family: More than three times a week   Attends Religious Services: More than 4 times per year   Active Member of [employer] or Organizations: Yes   Attends Music therapist: More than 4 times per year   Marital Status: Married  Human resources officer Violence: Not At Risk   Fear of Current or Ex-Partner: No   Emotionally Abused: No   Physically Abused: No   Sexually Abused: No    FAMILY HISTORY:  Family History  Problem Relation Age of Onset   Leukemia Mother    Cancer Mother    Diabetes Mother    Prostate cancer Father    Lung cancer Sister 9   Diabetes Sister    Colon cancer Neg Hx     CURRENT MEDICATIONS:  Outpatient Encounter Medications as of 08/09/2021  Medication Sig   acetaminophen (TYLENOL) 500 MG tablet Take 500 mg by mouth daily as needed for mild pain or moderate pain. For pain   amLODipine (NORVASC) 2.5 MG tablet TAKE 1 TABLET DAILY (ADD AMLODIPINE 2.5 MG TABLET DAILY TO CURRENT 5 MG TABLET DAILY SO TOTAL DOSE IS 7.5 MG DAILY)   amLODipine (NORVASC) 5 MG tablet TAKE 1 TABLET DAILY (DOSE INCREASE EFFECTIVE 07/12/2020)   aspirin 81 MG chewable tablet Chew 1 tablet (81 mg total) by mouth daily.   atorvastatin (LIPITOR) 80 MG tablet TAKE 1 TABLET DAILY AT 6 P.M.   BD PEN NEEDLE MICRO U/F 32G X 6 MM MISC USE TO INJECT INSULIN DAILY   betamethasone dipropionate 0.05 % cream Apply topically 2 (two) times daily.   calcium-vitamin D (OSCAL WITH D)  500-200 MG-UNIT tablet Take 1 tablet by mouth.   Easy Touch Safety Lancets 28G MISC Twice daily testing dx E11.65   glipiZIDE (GLIPIZIDE XL) 2.5 MG 24 hr tablet Take 1 tablet (2.5 mg total) by mouth daily with breakfast.   insulin degludec (TRESIBA) 100 UNIT/ML FlexTouch Pen Inject 38 Units into the skin daily.   irbesartan (AVAPRO) 300 MG tablet Take 1 tablet (300 mg total) by mouth daily.   Lancets (ONETOUCH ULTRASOFT) lancets USE AS DIRECTED TWICE DAILY   metoprolol tartrate (LOPRESSOR) 100 MG tablet TAKE 1 TABLET TWICE A DAY   Multiple Vitamin (MULTIVITAMIN WITH MINERALS) TABS tablet Take 1 tablet by mouth daily.   ONETOUCH ULTRA test strip USE AS INSTRUCTED FOR TWICE A DAY TESTING   pantoprazole (PROTONIX) 40 MG tablet TAKE 1 TABLET BY MOUTH  [place] (Patient not taking: Reported on 06/29/2021)   polyethylene glycol (MIRALAX / GLYCOLAX) 17 g packet Take 17 g by mouth daily as needed.   spironolactone (ALDACTONE) 50 MG tablet Take 1 tablet (50 mg total) by mouth daily.   vitamin B-12 (CYANOCOBALAMIN) 500 MCG tablet Take 500 mcg by mouth daily.   No facility-administered encounter medications on file as of 08/09/2021.    ALLERGIES:  Allergies  Allergen Reactions   Poison Oak Extract [Poison Oak Extract] Hives   Sulfonamide Derivatives Itching    Burning sensation all over body   Levemir [Insulin Detemir] Rash     PHYSICAL EXAM:  ECOG PERFORMANCE STATUS: 0 - Asymptomatic  There were no vitals filed for this visit. There were no vitals filed for this visit. Physical Exam Constitutional:      Appearance: Normal appearance. She is obese.  HENT:     Head: Normocephalic and atraumatic.     Mouth/Throat:     Mouth: Mucous membranes are moist.  Eyes:     Extraocular Movements: Extraocular movements intact.     Pupils: Pupils are equal, round, and reactive to light.  Cardiovascular:     Rate and Rhythm: Normal rate and regular rhythm.     Pulses: Normal pulses.      Heart sounds: Normal heart sounds.  Pulmonary:     Effort: Pulmonary effort is normal.     Breath sounds: Normal breath sounds.  Abdominal:     General: Bowel sounds are normal.     Palpations: Abdomen is soft.     Tenderness: There is no abdominal tenderness.  Musculoskeletal:        General: No swelling.     Right lower leg: No edema.     Left lower leg: No edema.  Lymphadenopathy:     Cervical: No cervical adenopathy.  Skin:    General: Skin is warm and dry.  Neurological:     General: No focal deficit present.     Mental Status: She is alert and oriented to person, place, and time.  Psychiatric:        Mood and Affect: Mood normal.        Behavior: Behavior normal.     LABORATORY DATA:  I have reviewed the labs as listed.  CBC    Component Value Date/Time   WBC 8.3 08/02/2021 1328   RBC 3.60 (L) 08/02/2021 1328   HGB 11.3 (L) 08/02/2021 1328   HCT 34.8 (L) 08/02/2021 1328   PLT 210 08/02/2021 1328   MCV 96.7 08/02/2021 1328   MCH 31.4 08/02/2021 1328   MCHC 32.5 08/02/2021 1328   RDW 12.7 08/02/2021 1328   LYMPHSABS 2.7 08/02/2021 1328   MONOABS 0.9 08/02/2021 1328   EOSABS 0.3 08/02/2021 1328   BASOSABS 0.0 08/02/2021 1328   CMP Latest Ref Rng & Units 06/30/2021 04/28/2021 01/25/2021  Glucose 65 - 99 mg/dL 84 110(H) 201(H)  BUN 8 - 27 mg/dL 25 32(H) 34(H)  Creatinine 0.57 - 1.00 mg/dL 1.37(H) 1.36(H) 1.26(H)  Sodium 134 - 144 mmol/L 144 142 139  Potassium 3.5 - 5.2 mmol/L 4.9 4.5 4.6  Chloride 96 - 106 mmol/L 105 105 107  CO2 20 - 29 mmol/L _0 Calcium 8.7 - 10.3 mg/dL 9.9 9.8 9.6  Total Protein 6.0 - 8.5 g/dL 6.9 7.1 7.4  Total Bilirubin 0.0 - 1.2 mg/dL 0.3 0.3 0.7  Alkaline Phos 44 - 121 IU/L 94 108 87  AST 0 -  40 IU/L _0 ALT 0 - 32 IU/L _1 DIAGNOSTIC IMAGING:  I have independently reviewed the relevant imaging and discussed with the patient.  ASSESSMENT & PLAN: 1.  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 pylori - Colonoscopy (07/18/2017): Internal and external hemorrhoids - SPEP and IFE in 2018 were negative for monoclonal gammopathy - No major bleeding events such as hematemesis, hematochezia, or melena - Most recent labs (08/02/2021): Ferritin 225, iron saturation 24%, Hgb 11.3/MCV 96.7 - Anemia is primarily secondary to CKD stage III, but hemoglobin remains at goal.  If Hgb drops to 9.0-10.0, would consider starting patient on ESA. - PLAN: Repeat labs (CBC, CMP, iron panel, B12, folate) in 6 months with phone visit after.    2.  Chronic kidney disease, stage III - CMP (06/30/2021) shows creatinine 1.37 and EGFR 39, in keeping with her CKD stage IIIb. - Kidney function appears to be at her baseline CKD - PLAN: Continue to encourage adequate water intake and avoidance of NSAIDs.  Patient can discuss with PCP regarding whether or not she needs referral to nephrologist.   3.  History of elevated LFTs  - LFTs were noted to be minimally elevated at her last visit November 2021 - Appears to be self-limited and has resolved without intervention - PLAN: Unknown etiology, but no further work-up needed at this time.   PLAN SUMMARY & DISPOSITION: -Labs in 6 months - Phone visit after labs  All questions were answered. The patient knows to call the clinic with any problems, questions or concerns.  Medical decision making: Low  Time spent on visit: I spent 15 minutes counseling the patient face to face. The total time spent in the appointment was 25 minutes and more than 50% was on counseling.   [name], PA-C  08/09/2021 12:59 PM

## 2021-08-09 ENCOUNTER — Other Ambulatory Visit: Payer: Self-pay

## 2021-08-09 ENCOUNTER — Ambulatory Visit (HOSPITAL_COMMUNITY): Payer: Medicare Other | Admitting: Physician Assistant

## 2021-08-09 ENCOUNTER — Other Ambulatory Visit: Payer: Self-pay | Admitting: Nurse Practitioner

## 2021-08-09 ENCOUNTER — Inpatient Hospital Stay (HOSPITAL_BASED_OUTPATIENT_CLINIC_OR_DEPARTMENT_OTHER): Payer: Medicare Other | Admitting: Physician Assistant

## 2021-08-09 VITALS — BP 167/55 | HR 66 | Temp 97.9°F | Resp 18 | Wt 210.3 lb

## 2021-08-09 DIAGNOSIS — N183 Chronic kidney disease, stage 3 unspecified: Secondary | ICD-10-CM | POA: Diagnosis not present

## 2021-08-09 DIAGNOSIS — D631 Anemia in chronic kidney disease: Secondary | ICD-10-CM

## 2021-08-09 DIAGNOSIS — R748 Abnormal levels of other serum enzymes: Secondary | ICD-10-CM

## 2021-08-09 DIAGNOSIS — R7989 Other specified abnormal findings of blood chemistry: Secondary | ICD-10-CM

## 2021-08-09 NOTE — Patient Instructions (Signed)
[facility] at [facility] Discharge Instructions  You were seen today by Rojelio Brenner PA-C for your anemia.  As we have discussed your low blood count ("anemia") related to your chronic kidney disease.  Your hemoglobin remains mildly low, but within your normal baseline range.  You do not need any treatment for that at this time, we will continue to monitor periodically.  LABS: Return in 6 months for repeat labs  OTHER TESTS: No other tests at this time  MEDICATIONS: No changes to home medications  FOLLOW-UP APPOINTMENT: Phone visit in 6 months, after labs   Thank you for choosing [facility] at [facility] to provide your oncology and hematology care.  To afford each patient quality time with our provider, please arrive at least 15 minutes before your scheduled appointment time.   If you have a lab appointment with the Cancer Center please come in thru the Main Entrance and check in at the main information desk.  You need to re-schedule your appointment should you arrive 10 or more minutes late.  We strive to give you quality time with our providers, and arriving late affects you and other patients whose appointments are after yours.  Also, if you no show three or more times for appointments you may be dismissed from the clinic at the providers discretion.     Again, thank you for choosing [facility].  Our hope is that these requests will decrease the amount of time that you wait before being seen by our physicians.       _____________________________________________________________  Should you have questions after your visit to [facility], please contact our office at [phone] and follow the prompts.  Our office hours are 8:00 a.m. and 4:30 p.m. Monday - Friday.  Please note that voicemails left after 4:00 p.m. may not be returned until the following business day.  We are closed weekends and major holidays.   You do have access to a nurse 24-7, just call the main number to the clinic [phone] and do not press any options, hold on the line and a nurse will answer the phone.    For prescription refill requests, have your pharmacy contact our office and allow 72 hours.    Due to Covid, you will need to wear a mask upon entering the hospital. If you do not have a mask, a mask will be given to you at the Main Entrance upon arrival. For doctor visits, patients may have 1 support person age 82 or older with them. For treatment visits, patients can not have anyone with them due to social distancing guidelines and our immunocompromised population.

## 2021-08-10 ENCOUNTER — Other Ambulatory Visit: Payer: Self-pay | Admitting: Nurse Practitioner

## 2021-08-21 ENCOUNTER — Telehealth: Payer: Self-pay | Admitting: Cardiology

## 2021-08-21 MED ORDER — METOPROLOL TARTRATE 100 MG PO TABS: 100.0000 mg | ORAL_TABLET | Freq: Two times a day (BID) | ORAL | 3 refills | Status: DC

## 2021-08-21 NOTE — Telephone Encounter (Signed)
*  STAT* If patient is at the pharmacy, call can be transferred to refill team.   1. Which medications need to be refilled? (please list name of each medication and dose if known) metoprolol tartrate (LOPRESSOR) 100 MG tablet  2. Which pharmacy/location (including street and city if local pharmacy) is medication to be sent to? [id number] - [place], [place] - [address]  3. Do they need a 30 day or 90 day supply? 90 day  Only has  2 days left

## 2021-08-24 ENCOUNTER — Ambulatory Visit (HOSPITAL_COMMUNITY): Payer: Medicare Other

## 2021-08-29 DIAGNOSIS — R7989 Other specified abnormal findings of blood chemistry: Secondary | ICD-10-CM | POA: Diagnosis not present

## 2021-08-29 DIAGNOSIS — E039 Hypothyroidism, unspecified: Secondary | ICD-10-CM | POA: Diagnosis not present

## 2021-08-30 ENCOUNTER — Other Ambulatory Visit: Payer: Self-pay

## 2021-08-30 ENCOUNTER — Encounter (HOSPITAL_COMMUNITY): Payer: Self-pay

## 2021-08-30 ENCOUNTER — Ambulatory Visit (HOSPITAL_COMMUNITY)
Admission: RE | Admit: 2021-08-30 | Discharge: 2021-08-30 | Disposition: A | Discharge status: Home / Self Care | Payer: Medicare Other | Source: Ambulatory Visit | Attending: Family Medicine | Admitting: Family Medicine

## 2021-08-30 DIAGNOSIS — Z1231 Encounter for screening mammogram for malignant neoplasm of breast: Secondary | ICD-10-CM | POA: Diagnosis not present

## 2021-08-30 LAB — TSH: TSH: 8.4 u[IU]/mL — ABNORMAL HIGH (ref 0.450–4.500)

## 2021-08-30 LAB — T4, FREE: Free T4: 0.75 ng/dL — ABNORMAL LOW (ref 0.82–1.77)

## 2021-09-04 ENCOUNTER — Other Ambulatory Visit: Payer: Self-pay

## 2021-09-04 ENCOUNTER — Ambulatory Visit (INDEPENDENT_AMBULATORY_CARE_PROVIDER_SITE_OTHER): Payer: Medicare Other | Admitting: Nurse Practitioner

## 2021-09-04 ENCOUNTER — Encounter: Payer: Self-pay | Admitting: Nurse Practitioner

## 2021-09-04 VITALS — BP 145/69 | HR 64 | Ht 67.0 in | Wt 209.2 lb

## 2021-09-04 DIAGNOSIS — E1122 Type 2 diabetes mellitus with diabetic chronic kidney disease: Secondary | ICD-10-CM | POA: Diagnosis not present

## 2021-09-04 DIAGNOSIS — E782 Mixed hyperlipidemia: Secondary | ICD-10-CM

## 2021-09-04 DIAGNOSIS — E039 Hypothyroidism, unspecified: Secondary | ICD-10-CM

## 2021-09-04 DIAGNOSIS — I10 Essential (primary) hypertension: Secondary | ICD-10-CM | POA: Diagnosis not present

## 2021-09-04 DIAGNOSIS — N182 Chronic kidney disease, stage 2 (mild): Secondary | ICD-10-CM | POA: Diagnosis not present

## 2021-09-04 DIAGNOSIS — Z794 Long term (current) use of insulin: Secondary | ICD-10-CM | POA: Diagnosis not present

## 2021-09-04 MED ORDER — LEVOTHYROXINE SODIUM 25 MCG PO TABS: 25.0000 ug | ORAL_TABLET | Freq: Every day | ORAL | 1 refills | Status: DC

## 2021-09-04 NOTE — Progress Notes (Signed)
09/04/2021, 9:19 AM  Endocrinology follow-up note  Subjective:    Patient ID: Kimberly Freeman, female    DOB: 01/02/1941.  Kimberly Freeman is being seen in consultation for management of currently uncontrolled symptomatic diabetes requested by  [name], MD.   Past Medical History:  Diagnosis Date   Arthritis    CAD (coronary artery disease) 08/2019   60% RI with other 20-30% lesions at cath   Diabetes mellitus, type 2 (HCC)    Essential hypertension    GERD (gastroesophageal reflux disease)    Hyperlipidemia    LVH (left ventricular hypertrophy)     Past Surgical History:  Procedure Laterality Date   ABDOMINAL HYSTERECTOMY     BIOPSY  07/18/2017   Procedure: BIOPSY;  Surgeon: [name], MD;  Location: [facility];  Service: Endoscopy;;  gastric   CHOLECYSTECTOMY  2004   COLONOSCOPY N/A 07/18/2017   Dr. [name]: Internal and external hemorrhoids.  No future screening/surveillance colonoscopies due to age.   ESOPHAGOGASTRODUODENOSCOPY N/A 07/18/2017   Dr. [name]: Small hiatal hernia, patchy mild inflammation characterized by congestion, erosions, erythema in the cardia, gastric body and antrum.  Diffuse moderate inflammation in the duodenal bulb and second portion duodenum.  Biopsies benign.  No H. pylori.   LEFT HEART CATH AND CORONARY ANGIOGRAPHY N/A 09/21/2019   Procedure: LEFT HEART CATH AND CORONARY ANGIOGRAPHY;  Surgeon: [name], MD;  Location: [facility];  Service: Cardiovascular;  Laterality: N/A;    Social History   Socioeconomic History   Marital status: Married    Spouse name: [name]    Number of children: 0   Years of education: 12+   Highest education level: 12th grade  Occupational History   Occupation: retired   Tobacco Use   Smoking status: Never   Smokeless tobacco: Never  Vaping Use   Vaping Use: Never used  Substance and Sexual Activity    Alcohol use: No    Alcohol/week: 0.0 standard drinks   Drug use: No   Sexual activity: Not on file  Other Topics Concern   Not on file  Social History Narrative   Lives alone with husband    Social Determinants of Health   Financial Resource Strain: Low Risk    Difficulty of Paying Living Expenses: Not hard at all  Food Insecurity: No Food Insecurity   Worried About Programme researcher, broadcasting/film/video in the Last Year: Never true   Ran Out of Food in the Last Year: Never true  Transportation Needs: No Transportation Needs   Lack of Transportation (Medical): No   Lack of Transportation (Non-Medical): No  Physical Activity: Insufficiently Active   Days of Exercise per Week: 3 days   Minutes of Exercise per Session: 30 min  Stress: No Stress Concern Present   Feeling of Stress : Not at all  Social Connections: Socially Integrated   Frequency of Communication with Friends and Family: More than three times a week   Frequency of Social Gatherings with Friends and Family: More than three times a week   Attends Religious Services: More than 4 times per year   Active Member of [employer]  or Organizations: Yes   Attends Music therapist: More than 4 times per year   Marital Status: Married    Family History  Problem Relation Age of Onset   Leukemia Mother    Cancer Mother    Diabetes Mother    Prostate cancer Father    Lung cancer Sister 74   Diabetes Sister    Colon cancer Neg Hx     Outpatient Encounter Medications as of 09/04/2021  Medication Sig   acetaminophen (TYLENOL) 500 MG tablet Take 500 mg by mouth daily as needed for mild pain or moderate pain. For pain   amLODipine (NORVASC) 2.5 MG tablet TAKE 1 TABLET DAILY (ADD AMLODIPINE 2.5 MG TABLET DAILY TO CURRENT 5 MG TABLET DAILY SO TOTAL DOSE IS 7.5 MG DAILY)   amLODipine (NORVASC) 5 MG tablet TAKE 1 TABLET DAILY (DOSE INCREASE EFFECTIVE 07/12/2020)   aspirin 81 MG chewable tablet Chew 1 tablet (81 mg total) by mouth daily.    atorvastatin (LIPITOR) 80 MG tablet TAKE 1 TABLET DAILY AT 6 P.M.   BD PEN NEEDLE MICRO U/F 32G X 6 MM MISC USE TO INJECT INSULIN DAILY   betamethasone dipropionate 0.05 % cream Apply topically 2 (two) times daily.   calcium-vitamin D (OSCAL WITH D) 500-200 MG-UNIT tablet Take 1 tablet by mouth.   Easy Touch Safety Lancets 28G MISC Twice daily testing dx E11.65   glipiZIDE (GLUCOTROL XL) 2.5 MG 24 hr tablet TAKE 1 TABLET DAILY WITH BREAKFAST   irbesartan (AVAPRO) 300 MG tablet Take 1 tablet (300 mg total) by mouth daily.   Lancets (ONETOUCH ULTRASOFT) lancets USE AS DIRECTED TWICE DAILY   levothyroxine (SYNTHROID) 25 MCG tablet Take 1 tablet (25 mcg total) by mouth daily.   metoprolol tartrate (LOPRESSOR) 100 MG tablet Take 1 tablet (100 mg total) by mouth 2 (two) times daily.   Multiple Vitamin (MULTIVITAMIN WITH MINERALS) TABS tablet Take 1 tablet by mouth daily.   ONETOUCH ULTRA test strip USE AS INSTRUCTED FOR TWICE A DAY TESTING   pantoprazole (PROTONIX) 40 MG tablet TAKE 1 TABLET BY MOUTH 30 MINUTES BEFORE FIRST MEAL   polyethylene glycol (MIRALAX / GLYCOLAX) 17 g packet Take 17 g by mouth daily as needed.   spironolactone (ALDACTONE) 50 MG tablet Take 1 tablet (50 mg total) by mouth daily.   TRESIBA FLEXTOUCH 100 UNIT/ML FlexTouch Pen INJECT 38 UNITS UNDER THE SKIN DAILY   vitamin B-12 (CYANOCOBALAMIN) 500 MCG tablet Take 500 mcg by mouth daily.   No facility-administered encounter medications on file as of 09/04/2021.    ALLERGIES: Allergies  Allergen Reactions   Poison Oak Extract [Poison Oak Extract] Hives   Sulfonamide Derivatives Itching    Burning sensation all over body   Levemir [Insulin Detemir] Rash    VACCINATION STATUS: Immunization History  Administered Date(s) Administered   Fluad Quad(high Dose 65+) 07/27/2019, 06/23/2020, 06/29/2021   Influenza Split 08/01/2011, 07/31/2012   Influenza Whole 07/29/2007, 07/20/2008, 07/21/2009, 07/19/2010   Influenza, High  Dose Seasonal PF 07/23/2018   Influenza,inj,Quad PF,6+ Mos 08/07/2013, 06/15/2014, 07/19/2015, 06/28/2016, 07/25/2017   Moderna Sars-Covid-2 Vaccination 12/22/2019, 01/19/2020, 09/25/2020, 01/29/2021   Pneumococcal Conjugate-13 10/12/2014   Pneumococcal Polysaccharide-23 03/16/2004, 08/29/2012   Td 03/16/2004   Tdap 03/31/2012   Zoster, Live 01/02/2007    Diabetes She presents for her follow-up diabetic visit. She has type 2 diabetes mellitus. Onset time: She was diagnosed at approximate age of 42 years. Her disease course has been stable. There are no hypoglycemic associated  symptoms. Pertinent negatives for hypoglycemia include no confusion, headaches, pallor or seizures. Associated symptoms include fatigue. Pertinent negatives for diabetes include no chest pain, no polydipsia, no polyphagia and no polyuria. There are no hypoglycemic complications. Symptoms are stable. Diabetic complications include heart disease and nephropathy. Risk factors for coronary artery disease include diabetes mellitus, dyslipidemia, family history, hypertension, obesity, sedentary lifestyle and post-menopausal. Current diabetic treatment includes insulin injections and oral agent (monotherapy). She is compliant with treatment most of the time. Her weight is fluctuating minimally. She is following a generally healthy diet. When asked about meal planning, she reported none. She has had a previous visit with a dietitian. She participates in exercise intermittently. Her home blood glucose trend is fluctuating minimally. Her breakfast blood glucose range is generally 110-130 mg/dl. Her bedtime blood glucose range is generally 130-140 mg/dl. (She presents today with her meter and logs showing at target fasting and postprandial glycemic profile.  Unable to perform A1c in office today due to technical difficulties.  Analysis of her meter shows 7-day average of 137, 14- day average of 130, 30-day average of 130.  She denies any  hypoglycemia.) An ACE inhibitor/angiotensin II receptor blocker is not being taken. She sees a podiatrist.Eye exam is current.  Hyperlipidemia This is a chronic problem. The current episode started more than 1 year ago. The problem is controlled. Recent lipid tests were reviewed and are normal. Exacerbating diseases include chronic renal disease, diabetes and obesity. Factors aggravating her hyperlipidemia include beta blockers. Pertinent negatives include no chest pain, myalgias or shortness of breath. Current antihyperlipidemic treatment includes statins. The current treatment provides mild improvement of lipids. There are no compliance problems.  Risk factors for coronary artery disease include diabetes mellitus, dyslipidemia, obesity, hypertension, a sedentary lifestyle and post-menopausal.  Hypertension This is a chronic problem. The current episode started more than 1 year ago. The problem is unchanged. The problem is controlled. Pertinent negatives include no chest pain, headaches, palpitations or shortness of breath. There are no associated agents to hypertension. Risk factors for coronary artery disease include sedentary lifestyle, obesity, post-menopausal state, diabetes mellitus and dyslipidemia. Past treatments include calcium channel blockers, beta blockers and diuretics. The current treatment provides mild improvement. There are no compliance problems.  Hypertensive end-organ damage includes kidney disease and CAD/MI. Identifiable causes of hypertension include chronic renal disease.   Review of systems  Constitutional: + Minimally fluctuating body weight,  current Body mass index is 32.77 kg/m. , + fatigue, no subjective hyperthermia, no subjective hypothermia Eyes: no blurry vision, no xerophthalmia ENT: no sore throat, no nodules palpated in throat, no dysphagia/odynophagia, no hoarseness Cardiovascular: no chest pain, no shortness of breath, no palpitations, no leg  swelling Respiratory: no cough, no shortness of breath Gastrointestinal: no nausea/vomiting/diarrhea Musculoskeletal: no muscle/joint aches Skin: no rashes, no hyperemia Neurological: no tremors, no numbness, no tingling, no dizziness Psychiatric: no depression, no anxiety   Objective:    BP (!) 145/69   Pulse 64   Ht 5\' 7"  (1.702 m)   Wt 209 lb 3.2 oz (94.9 kg)   BMI 32.77 kg/m   Wt Readings from Last 3 Encounters:  09/04/21 209 lb 3.2 oz (94.9 kg)  08/09/21 210 lb 4.8 oz (95.4 kg)  06/29/21 206 lb (93.4 kg)    BP Readings from Last 3 Encounters:  09/04/21 (!) 145/69  08/09/21 (!) 167/55  06/29/21 (!) 143/65     Physical Exam- Limited  Constitutional:  Body mass index is 32.77 kg/m. , not in  acute distress, normal state of mind Eyes:  EOMI, no exophthalmos Neck: Supple Cardiovascular: RRR, no murmurs, rubs, or gallops, no edema Respiratory: Adequate breathing efforts, no crackles, rales, rhonchi, or wheezing Musculoskeletal: no gross deformities, strength intact in all four extremities, no gross restriction of joint movements Skin:  no rashes, no hyperemia Neurological: no tremor with outstretched hands    CMP     Component Value Date/Time   NA 144 06/30/2021 0837   K 4.9 06/30/2021 0837   CL 105 06/30/2021 0837   CO2 25 06/30/2021 0837   GLUCOSE 84 06/30/2021 0837   GLUCOSE 201 (H) 01/25/2021 1316   BUN 25 06/30/2021 0837   CREATININE 1.37 (H) 06/30/2021 0837   CREATININE 1.14 (H) 12/15/2019 1048   CALCIUM 9.9 06/30/2021 0837   PROT 6.9 06/30/2021 0837   ALBUMIN 4.5 06/30/2021 0837   AST 22 06/30/2021 0837   ALT 29 06/30/2021 0837   ALKPHOS 94 06/30/2021 0837   BILITOT 0.3 06/30/2021 0837   GFRNONAA 43 (L) 01/25/2021 1316   GFRNONAA 45 (L) 12/15/2019 1048   GFRAA 48 (L) 09/05/2020 0810   GFRAA 53 (L) 12/15/2019 1048     Diabetic Labs (most recent): Lab Results  Component Value Date   HGBA1C 7.6 (A) 05/04/2021   HGBA1C 7.5 (A) 12/21/2020    HGBA1C 9.2 (A) 09/01/2020     Lipid Panel ( most recent) Lipid Panel     Component Value Date/Time   CHOL 156 06/30/2021 0837   TRIG 106 06/30/2021 0837   HDL 47 06/30/2021 0837   CHOLHDL 3.3 06/30/2021 0837   CHOLHDL 3.3 12/15/2019 1048   VLDL 21 03/22/2017 0803   LDLCALC 90 06/30/2021 0837   LDLCALC 80 12/15/2019 1048   LABVLDL 19 06/30/2021 0837      Lab Results  Component Value Date   TSH 8.400 (H) 08/29/2021   TSH 7.640 (H) 04/28/2021   TSH 4.360 05/23/2020   TSH 6.85 (H) 07/24/2019   TSH 5.19 (H) 08/04/2018   TSH 3.81 04/10/2016   TSH 2.410 12/13/2015   TSH 5.289 (H) 11/14/2015   TSH 3.963 06/14/2014   TSH 3.427 06/03/2013   FREET4 0.75 (L) 08/29/2021   FREET4 0.75 (L) 04/28/2021   FREET4 1.0 12/15/2019   FREET4 0.8 07/24/2019   FREET4 0.9 08/04/2018   FREET4 0.78 12/13/2015   FREET4 0.84 11/14/2015       Assessment & Plan:   1) Type 2 diabetes mellitus with stage 2 chronic kidney disease, with long-term current use of insulin (HCC)  - Kimberly Freeman has currently uncontrolled symptomatic type 2 DM since  80 years of age. Recent labs reviewed.  She presents today with her meter and logs showing at target fasting and postprandial glycemic profile.  Unable to perform A1c in office today due to technical difficulties.  Analysis of her meter shows 7-day average of 137, 14- day average of 130, 30-day average of 130.  She denies any hypoglycemia.  - I had a long discussion with her about the progressive nature of diabetes and the pathology behind its complications. -her diabetes is complicated by stage 2 CKD, obesity /sedentary life, and she remains at a high risk for more acute and chronic complications which include CAD, CVA, CKD, retinopathy, and neuropathy. These are all discussed in detail with her.  - Nutritional counseling repeated at each appointment due to patients tendency to fall back in to old habits.  - The patient admits there is a room for  improvement in their diet and drink choices. -  Suggestion is made for the patient to avoid simple carbohydrates from their diet including Cakes, Sweet Desserts / Pastries, Ice Cream, Soda (diet and regular), Sweet Tea, Candies, Chips, Cookies, Sweet Pastries, Store Bought Juices, Alcohol in Excess of 1-2 drinks a day, Artificial Sweeteners, Coffee Creamer, and "Sugar-free" Products. This will help patient to have stable blood glucose profile and potentially avoid unintended weight gain.   - I encouraged the patient to switch to unprocessed or minimally processed complex starch and increased protein intake (animal or plant source), fruits, and vegetables.   - Patient is advised to stick to a routine mealtimes to eat 3 meals a day and avoid unnecessary snacks (to snack only to correct hypoglycemia).  - I have approached her with the following individualized plan to manage  her diabetes and patient agrees:   - she will continue to need at least basal insulin in order for her to achieve control of diabetes to target.    - Based on her stable glycemic profile, she is advised to continue her Tresiba 38 units sQ nightly and Glipizide 2.5 mg XL daily with breakfast.    -She is encouraged to continue monitoring glucose twice daily, before breakfast and before bed, and to call the clinic if she has readings less than 70 or greater than 300 for 3 tests in a row.  -She will be considered for incretin therapy during her next visit.  - Specific targets for  A1c;  LDL, HDL,  and Triglycerides were discussed with the patient.  2) Blood Pressure /Hypertension:  Her blood pressure is controlled to target for her age.  She is advised to continue Norvasc 7.5 mg po daily, Irbesartan 300 mg po daily, Metoprolol 100 mg po twice daily, and Aldactone 50 mg po daily.  3) Lipids/Hyperlipidemia:    Her most recent lipid panel from 06/30/21 shows controlled LDL at 90.  She is advised to continue Atorvastatin 80 mg po daily  at bedtime.  Side effects and precautions discussed with her.  4)  Weight/Diet:  Her Body mass index is 32.77 kg/m.  -   clearly complicating her diabetes care.   she is  a candidate for weight loss. I discussed with her the fact that loss of 5 - 10% of her  current body weight will have the most impact on her diabetes management.  Exercise, and detailed carbohydrates information provided  -  detailed on discharge instructions.  5) Hypothyroidism- unspecified Her last two thyroid function tests have been abnormal.  She will benefit from addition of low dose Levothyroxine 25 mcg po daily before breakfast.  We discussed proper medication administration.  Will recheck TFTs prior to next visit and adjust dose further if needed.   - The correct intake of thyroid hormone (Levothyroxine, Synthroid), is on empty stomach first thing in the morning, with water, separated by at least 30 minutes from breakfast and other medications,  and separated by more than 4 hours from calcium, iron, multivitamins, acid reflux medications (PPIs).  - This medication is a life-long medication and will be needed to correct thyroid hormone imbalances for the rest of your life.  The dose may change from time to time, based on thyroid blood work.  - It is extremely important to be consistent taking this medication, near the same time each morning.  -AVOID TAKING PRODUCTS CONTAINING BIOTIN (commonly found in Hair, Skin, Nails vitamins) AS IT INTERFERES WITH THE VALIDITY OF THYROID  FUNCTION BLOOD TESTS.  6) Chronic Care/Health Maintenance: -she is on Statin medications and  is encouraged to initiate and continue to follow up with Ophthalmology, Dentist,  Podiatrist at least yearly or according to recommendations, and advised to stay away from smoking. I have recommended yearly flu vaccine and pneumonia vaccine at least every 5 years; moderate intensity exercise for up to 150 minutes weekly; and  sleep for at least 7 hours a  day.  - she is advised to maintain close follow up with Fayrene Helper, MD for primary care needs, as well as her other providers for optimal and coordinated care.     I spent 30 minutes in the care of the patient today including review of labs from [facility], Lipids, Thyroid Function, Hematology (current and previous including abstractions from other facilities); face-to-face time discussing  her blood glucose readings/logs, discussing hypoglycemia and hyperglycemia episodes and symptoms, medications doses, her options of short and long term treatment based on the latest standards of care / guidelines;  discussion about incorporating lifestyle medicine;  and documenting the encounter.    Please refer to Patient Instructions for Blood Glucose Monitoring and Insulin/Medications Dosing Guide"  in media tab for additional information. Please  also refer to " Patient Self Inventory" in the Media  tab for reviewed elements of pertinent patient history.  [name] participated in the discussions, expressed understanding, and voiced agreement with the above plans.  All questions were answered to her satisfaction. she is encouraged to contact clinic should she have any questions or concerns prior to her return visit.   Follow up plan: - Return in about 4 months (around 01/02/2022) for Diabetes F/U with A1c in office, Thyroid follow up, Previsit labs, Bring meter and logs.  Rayetta Pigg, [facility] [address] [place], [place] [address] Phone: [phone] Fax: [phone]  09/04/2021, 9:19 AM

## 2021-09-04 NOTE — Patient Instructions (Signed)

## 2021-09-19 ENCOUNTER — Other Ambulatory Visit: Payer: Self-pay | Admitting: Cardiology

## 2021-09-19 DIAGNOSIS — E1142 Type 2 diabetes mellitus with diabetic polyneuropathy: Secondary | ICD-10-CM | POA: Diagnosis not present

## 2021-09-19 DIAGNOSIS — B351 Tinea unguium: Secondary | ICD-10-CM | POA: Diagnosis not present

## 2021-09-26 DIAGNOSIS — E119 Type 2 diabetes mellitus without complications: Secondary | ICD-10-CM | POA: Diagnosis not present

## 2021-09-28 ENCOUNTER — Encounter: Payer: Medicare Other | Admitting: Family Medicine

## 2021-09-30 ENCOUNTER — Other Ambulatory Visit: Payer: Self-pay

## 2021-09-30 ENCOUNTER — Ambulatory Visit (INDEPENDENT_AMBULATORY_CARE_PROVIDER_SITE_OTHER): Payer: Medicare Other | Admitting: *Deleted

## 2021-09-30 DIAGNOSIS — Z0000 Encounter for general adult medical examination without abnormal findings: Secondary | ICD-10-CM | POA: Diagnosis not present

## 2021-09-30 NOTE — Patient Instructions (Signed)
Kimberly Freeman , Thank you for taking time to come for your Medicare Wellness Visit. I appreciate your ongoing commitment to your health goals. Please review the following plan we discussed and let me know if I can assist you in the future.   Screenings Recommended yearly ophthalmology/optometry visit for glaucoma screening and checkup Recommended yearly dental visit for hygiene and checkup  Vaccinations: Influenza vaccine: completed Pneumococcal vaccine: completed Tdap vaccine: completed Shingles vaccine: due now    Advanced directives: information provided  Conditions/risks identified: diabetes  Next appointment: 1 year  Preventive Care 82 Years and Older, Female Preventive care refers to lifestyle choices and visits with your health care provider that can promote health and wellness. What does preventive care include? A yearly physical exam. This is also called an annual well check. Dental exams once or twice a year. Routine eye exams. Ask your health care provider how often you should have your eyes checked. Personal lifestyle choices, including: Daily care of your teeth and gums. Regular physical activity. Eating a healthy diet. Avoiding tobacco and drug use. Limiting alcohol use. Practicing safe sex. Taking low doses of aspirin every day. Taking vitamin and mineral supplements as recommended by your health care provider. What happens during an annual well check? The services and screenings done by your health care provider during your annual well check will depend on your age, overall health, lifestyle risk factors, and family history of disease. Counseling  Your health care provider may ask you questions about your: Alcohol use. Tobacco use. Drug use. Emotional well-being. Home and relationship well-being. Sexual activity. Eating habits. History of falls. Memory and ability to understand (cognition). Work and work Astronomer. Screening  You may have the following  tests or measurements: Height, weight, and BMI. Blood pressure. Lipid and cholesterol levels. These may be checked every 5 years, or more frequently if you are over 52 years old. Skin check. Lung cancer screening. You may have this screening every year starting at age 30 if you have a 30-pack-year history of smoking and currently smoke or have quit within the past 15 years. Fecal occult blood test (FOBT) of the stool. You may have this test every year starting at age 32. Flexible sigmoidoscopy or colonoscopy. You may have a sigmoidoscopy every 5 years or a colonoscopy every 10 years starting at age 63. Prostate cancer screening. Recommendations will vary depending on your family history and other risks. Hepatitis C blood test. Hepatitis B blood test. Sexually transmitted disease (STD) testing. Diabetes screening. This is done by checking your blood sugar (glucose) after you have not eaten for a while (fasting). You may have this done every 1-3 years. Abdominal aortic aneurysm (AAA) screening. You may need this if you are a current or former smoker. Osteoporosis. You may be screened starting at age 70 if you are at high risk. Talk with your health care provider about your test results, treatment options, and if necessary, the need for more tests. Vaccines  Your health care provider may recommend certain vaccines, such as: Influenza vaccine. This is recommended every year. Tetanus, diphtheria, and acellular pertussis (Tdap, Td) vaccine. You may need a Td booster every 10 years. Zoster vaccine. You may need this after age 34. Pneumococcal 13-valent conjugate (PCV13) vaccine. One dose is recommended after age 68. Pneumococcal polysaccharide (PPSV23) vaccine. One dose is recommended after age 29. Talk to your health care provider about which screenings and vaccines you need and how often you need them. This information is not intended  to replace advice given to you by your health care provider.  Make sure you discuss any questions you have with your health care provider. Document Released: 11/04/2015 Document Revised: 06/27/2016 Document Reviewed: 08/09/2015 Elsevier Interactive Patient Education  2017 [place] Prevention in the Home Falls can cause injuries. They can happen to people of all ages. There are many things you can do to make your home safe and to help prevent falls. What can I do on the outside of my home? Regularly fix the edges of walkways and driveways and fix any cracks. Remove anything that might make you trip as you walk through a door, such as a raised step or threshold. Trim any bushes or trees on the path to your home. Use bright outdoor lighting. Clear any walking paths of anything that might make someone trip, such as rocks or tools. Regularly check to see if handrails are loose or broken. Make sure that both sides of any steps have handrails. Any raised decks and porches should have guardrails on the edges. Have any leaves, snow, or ice cleared regularly. Use sand or salt on walking paths during winter. Clean up any spills in your garage right away. This includes oil or grease spills. What can I do in the bathroom? Use night lights. Install grab bars by the toilet and in the tub and shower. Do not use towel bars as grab bars. Use non-skid mats or decals in the tub or shower. If you need to sit down in the shower, use a plastic, non-slip stool. Keep the floor dry. Clean up any water that spills on the floor as soon as it happens. Remove soap buildup in the tub or shower regularly. Attach bath mats securely with double-sided non-slip rug tape. Do not have throw rugs and other things on the floor that can make you trip. What can I do in the bedroom? Use night lights. Make sure that you have a light by your bed that is easy to reach. Do not use any sheets or blankets that are too big for your bed. They should not hang down onto the floor. Have a  firm chair that has side arms. You can use this for support while you get dressed. Do not have throw rugs and other things on the floor that can make you trip. What can I do in the kitchen? Clean up any spills right away. Avoid walking on wet floors. Keep items that you use a lot in easy-to-reach places. If you need to reach something above you, use a strong step stool that has a grab bar. Keep electrical cords out of the way. Do not use floor polish or wax that makes floors slippery. If you must use wax, use non-skid floor wax. Do not have throw rugs and other things on the floor that can make you trip. What can I do with my stairs? Do not leave any items on the stairs. Make sure that there are handrails on both sides of the stairs and use them. Fix handrails that are broken or loose. Make sure that handrails are as long as the stairways. Check any carpeting to make sure that it is firmly attached to the stairs. Fix any carpet that is loose or worn. Avoid having throw rugs at the top or bottom of the stairs. If you do have throw rugs, attach them to the floor with carpet tape. Make sure that you have a light switch at the top of the stairs and  the bottom of the stairs. If you do not have them, ask someone to add them for you. What else can I do to help prevent falls? Wear shoes that: Do not have high heels. Have rubber bottoms. Are comfortable and fit you well. Are closed at the toe. Do not wear sandals. If you use a stepladder: Make sure that it is fully opened. Do not climb a closed stepladder. Make sure that both sides of the stepladder are locked into place. Ask someone to hold it for you, if possible. Clearly mark and make sure that you can see: Any grab bars or handrails. First and last steps. Where the edge of each step is. Use tools that help you move around (mobility aids) if they are needed. These include: Canes. Walkers. Scooters. Crutches. Turn on the lights when you  go into a dark area. Replace any light bulbs as soon as they burn out. Set up your furniture so you have a clear path. Avoid moving your furniture around. If any of your floors are uneven, fix them. If there are any pets around you, be aware of where they are. Review your medicines with your doctor. Some medicines can make you feel dizzy. This can increase your chance of falling. Ask your doctor what other things that you can do to help prevent falls. This information is not intended to replace advice given to you by your health care provider. Make sure you discuss any questions you have with your health care provider. Document Released: 08/04/2009 Document Revised: 03/15/2016 Document Reviewed: 11/12/2014 Elsevier Interactive Patient Education  2017 [employer].

## 2021-09-30 NOTE — Progress Notes (Signed)
Subjective:   Kimberly Freeman is a 80 y.o. female who presents for Medicare Annual (Subsequent) preventive examination.  I connected with  Kimberly Freeman on 09/30/21 by a audio enabled telemedicine application and verified that I am speaking with the correct person using two identifiers.  Patient Location: Home  Provider Location: Other:  home  I discussed the limitations of evaluation and management by telemedicine. The patient expressed understanding and agreed to proceed.   Review of Systems     [name] , Thank you for taking time to come for your Medicare Wellness Visit. I appreciate your ongoing commitment to your health goals. Please review the following plan we discussed and let me know if I can assist you in the future.   These are the goals we discussed:  Goals      DIET - REDUCE SUGAR INTAKE     Exercise 5x per week (60 min per time)     Starting 10/04/2016 patient would like to start back on her exercise bicycle 5 times a week for 60 minutes at a time.        This is a list of the screening recommended for you and due dates:  Health Maintenance  Topic Date Due   Zoster (Shingles) Vaccine (1 of 2) Never done   Eye exam for diabetics  03/01/2021   COVID-19 Vaccine (5 - Booster for Moderna series) 03/26/2021   Hemoglobin A1C  11/04/2021   Complete foot exam   12/21/2021   Tetanus Vaccine  03/31/2022   Pneumonia Vaccine  Completed   Flu Shot  Completed   DEXA scan (bone density measurement)  Completed   HPV Vaccine  Aged Out          Objective:    There were no vitals filed for this visit. There is no height or weight on file to calculate BMI.  Advanced Directives 08/09/2021 02/01/2021 09/27/2020 09/02/2020 02/26/2020 09/21/2019 09/20/2019  Does Patient Have a Medical Advance Directive? No No No No No No No  Type of Advance Directive - - - - - - -  Does patient want to make changes to medical advance directive? - - - - - - -  Copy of Healthcare Power of  Attorney in Chart? - - - - - - -  Would patient like information on creating a medical advance directive? No - Patient declined No - Patient declined No - Patient declined No - Patient declined No - Patient declined No - Patient declined -    Current Medications (verified) Outpatient Encounter Medications as of 09/30/2021  Medication Sig   acetaminophen (TYLENOL) 500 MG tablet Take 500 mg by mouth daily as needed for mild pain or moderate pain. For pain   amLODipine (NORVASC) 2.5 MG tablet TAKE 1 TABLET DAILY (ADD AMLODIPINE 2.5 MG TABLET DAILY TO CURRENT 5 MG TABLET DAILY SO TOTAL DOSE IS 7.5 MG DAILY)   amLODipine (NORVASC) 5 MG tablet TAKE 1 TABLET DAILY (DOSE INCREASE EFFECTIVE 07/12/2020)   aspirin 81 MG chewable tablet Chew 1 tablet (81 mg total) by mouth daily.   atorvastatin (LIPITOR) 80 MG tablet TAKE 1 TABLET DAILY AT 6 P.M.   BD PEN NEEDLE MICRO U/F 32G X 6 MM MISC USE TO INJECT INSULIN DAILY   betamethasone dipropionate 0.05 % cream Apply topically 2 (two) times daily.   calcium-vitamin D (OSCAL WITH D) 500-200 MG-UNIT tablet Take 1 tablet by mouth.   Easy Touch Safety Lancets 28G MISC Twice daily  testing dx E11.65   glipiZIDE (GLUCOTROL XL) 2.5 MG 24 hr tablet TAKE 1 TABLET DAILY WITH BREAKFAST   irbesartan (AVAPRO) 300 MG tablet Take 1 tablet (300 mg total) by mouth daily.   Lancets (ONETOUCH ULTRASOFT) lancets USE AS DIRECTED TWICE DAILY   levothyroxine (SYNTHROID) 25 MCG tablet Take 1 tablet (25 mcg total) by mouth daily.   metoprolol tartrate (LOPRESSOR) 100 MG tablet TAKE 1 TABLET TWICE A DAY   Multiple Vitamin (MULTIVITAMIN WITH MINERALS) TABS tablet Take 1 tablet by mouth daily.   ONETOUCH ULTRA test strip USE AS INSTRUCTED FOR TWICE A DAY TESTING   pantoprazole (PROTONIX) 40 MG tablet TAKE 1 TABLET BY MOUTH 30 MINUTES BEFORE FIRST MEAL   polyethylene glycol (MIRALAX / GLYCOLAX) 17 g packet Take 17 g by mouth daily as needed.   spironolactone (ALDACTONE) 50 MG tablet Take  1 tablet (50 mg total) by mouth daily.   TRESIBA FLEXTOUCH 100 UNIT/ML FlexTouch Pen INJECT 38 UNITS UNDER THE SKIN DAILY   vitamin B-12 (CYANOCOBALAMIN) 500 MCG tablet Take 500 mcg by mouth daily.   No facility-administered encounter medications on file as of 09/30/2021.    Allergies (verified) Poison oak extract [poison oak extract], Sulfonamide derivatives, and Levemir [insulin detemir]   History: Past Medical History:  Diagnosis Date   Arthritis    CAD (coronary artery disease) 08/2019   60% RI with other 20-30% lesions at cath   Diabetes mellitus, type 2 ([place])    Essential hypertension    GERD (gastroesophageal reflux disease)    Hyperlipidemia    LVH (left ventricular hypertrophy)    Past Surgical History:  Procedure Laterality Date   ABDOMINAL HYSTERECTOMY     BIOPSY  07/18/2017   Procedure: BIOPSY;  Surgeon: Danie Binder, MD;  Location: [facility];  Service: Endoscopy;;  gastric   CHOLECYSTECTOMY  2004   COLONOSCOPY N/A 07/18/2017   Dr. Oneida Alar: Internal and external hemorrhoids.  No future screening/surveillance colonoscopies due to age.   ESOPHAGOGASTRODUODENOSCOPY N/A 07/18/2017   Dr. Oneida Alar: Small hiatal hernia, patchy mild inflammation characterized by congestion, erosions, erythema in the cardia, gastric body and antrum.  Diffuse moderate inflammation in the duodenal bulb and second portion duodenum.  Biopsies benign.  No H. pylori.   LEFT HEART CATH AND CORONARY ANGIOGRAPHY N/A 09/21/2019   Procedure: LEFT HEART CATH AND CORONARY ANGIOGRAPHY;  Surgeon: Troy Sine, MD;  Location: [facility];  Service: Cardiovascular;  Laterality: N/A;   Family History  Problem Relation Age of Onset   Leukemia Mother    Cancer Mother    Diabetes Mother    Prostate cancer Father    Lung cancer Sister 40   Diabetes Sister    Colon cancer Neg Hx    Social History   Socioeconomic History   Marital status: Married    Spouse name: Kimberly Freeman    Number of  children: 0   Years of education: 12+   Highest education level: 12th grade  Occupational History   Occupation: retired   Tobacco Use   Smoking status: Never   Smokeless tobacco: Never  Vaping Use   Vaping Use: Never used  Substance and Sexual Activity   Alcohol use: No    Alcohol/week: 0.0 standard drinks   Drug use: No   Sexual activity: Not on file  Other Topics Concern   Not on file  Social History Narrative   Lives alone with husband    Social Determinants of Health  Financial Resource Strain: Low Risk    Difficulty of Paying Living Expenses: Not hard at all  Food Insecurity: No Food Insecurity   Worried About Charity fundraiser in the Last Year: Never true   Ran Out of Food in the Last Year: Never true  Transportation Needs: No Transportation Needs   Lack of Transportation (Medical): No   Lack of Transportation (Non-Medical): No  Physical Activity: Insufficiently Active   Days of Exercise per Week: 3 days   Minutes of Exercise per Session: 30 min  Stress: No Stress Concern Present   Feeling of Stress : Not at all  Social Connections: Socially Integrated   Frequency of Communication with Friends and Family: More than three times a week   Frequency of Social Gatherings with Friends and Family: More than three times a week   Attends Religious Services: More than 4 times per year   Active Member of [employer] or Organizations: Yes   Attends Music therapist: More than 4 times per year   Marital Status: Married    Tobacco Counseling Counseling given: Not Answered   Clinical Intake:                 Diabetic?Nutrition Risk Assessment:  Has the patient had any N/V/D within the last 2 months?  No  Does the patient have any non-healing wounds?  No  Has the patient had any unintentional weight loss or weight gain?  No   Diabetes:  Is the patient diabetic?  Yes  If diabetic, was a CBG obtained today?  No  Did the patient bring in their  glucometer from home?  No  How often do you monitor your CBG's? Twice a day.   Financial Strains and Diabetes Management:  Are you having any financial strains with the device, your supplies or your medication? No .  Does the patient want to be seen by Chronic Care Management for management of their diabetes?  No  Would the patient like to be referred to a Nutritionist or for Diabetic Management?  No   Diabetic Exams:  Diabetic Eye Exam: Completed Dec 6 22 Diabetic Foot Exam: Completed 12-21-20          Activities of Daily Living No flowsheet data found.  Patient Care Team: Fayrene Helper, MD as PCP - General Domenic Polite Aloha Gell, MD as PCP - Cardiology (Cardiology) Caprice Beaver, DPM as Consulting Physician (Podiatry)  Indicate any recent Medical Services you may have received from other than [facility] providers in the past year (date may be approximate).     Assessment:   This is a routine wellness examination for Edis.  Hearing/Vision screen No results found.  Dietary issues and exercise activities discussed:     Goals Addressed   None   Depression Screen PHQ 2/9 Scores 06/29/2021 09/27/2020 09/27/2020 09/02/2020 07/26/2020 05/23/2020 03/31/2020  PHQ - 2 Score 0 0 0 0 0 0 0  PHQ- 9 Score - - - - - - 0    Fall Risk Fall Risk  06/29/2021 05/04/2021 12/08/2020 09/27/2020 09/06/2020  Falls in the past year? 0 0 0 0 0  Number falls in past yr: 0 - 0 0 0  Injury with Fall? 0 - 0 0 0  Risk for fall due to : - - - No Fall Risks -  Follow up - Falls evaluation completed - Falls evaluation completed -    FALL RISK PREVENTION PERTAINING TO THE HOME:  Any stairs in or around  the home? Yes  If so, are there any without handrails? Yes  Home free of loose throw rugs in walkways, pet beds, electrical cords, etc? Yes  Adequate lighting in your home to reduce risk of falls? Yes   ASSISTIVE DEVICES UTILIZED TO PREVENT FALLS:  Life alert? No  Use of a cane, walker or w/c? No   Grab bars in the bathroom? Yes  Shower chair or bench in shower? No  Elevated toilet seat or a handicapped toilet? No     Cognitive Function:     6CIT Screen 09/27/2020 09/24/2019 09/22/2018 09/18/2017 10/04/2016  What Year? 0 points 0 points 0 points 0 points 0 points  What month? 0 points 0 points 0 points 0 points 0 points  What time? 0 points 0 points 0 points 0 points 0 points  Count back from 20 0 points 0 points 0 points 0 points 0 points  Months in reverse 0 points 0 points 0 points 0 points 0 points  Repeat phrase 2 points 0 points 2 points 0 points 0 points  Total Score 2 0 2 0 0    Immunizations Immunization History  Administered Date(s) Administered   Fluad Quad(high Dose 65+) 07/27/2019, 06/23/2020, 06/29/2021   Influenza Split 08/01/2011, 07/31/2012   Influenza Whole 07/29/2007, 07/20/2008, 07/21/2009, 07/19/2010   Influenza, High Dose Seasonal PF 07/23/2018   Influenza,inj,Quad PF,6+ Mos 08/07/2013, 06/15/2014, 07/19/2015, 06/28/2016, 07/25/2017   Moderna Sars-Covid-2 Vaccination 12/22/2019, 01/19/2020, 09/25/2020, 01/29/2021   Pneumococcal Conjugate-13 10/12/2014   Pneumococcal Polysaccharide-23 03/16/2004, 08/29/2012   Td 03/16/2004   Tdap 03/31/2012   Zoster, Live 01/02/2007    TDAP status: Up to date  Flu Vaccine status: Up to date  Pneumococcal vaccine status: Up to date  Covid-19 vaccine status: Completed vaccines  Qualifies for Shingles Vaccine? Yes   Zostavax completed No   Shingrix Completed?: No.    Education has been provided regarding the importance of this vaccine. Patient has been advised to call insurance company to determine out of pocket expense if they have not yet received this vaccine. Advised may also receive vaccine at local pharmacy or Health Dept. Verbalized acceptance and understanding.  Screening Tests Health Maintenance  Topic Date Due   Zoster Vaccines- Shingrix (1 of 2) Never done   OPHTHALMOLOGY EXAM  03/01/2021    COVID-19 Vaccine (5 - Booster for Moderna series) 03/26/2021   HEMOGLOBIN A1C  11/04/2021   FOOT EXAM  12/21/2021   TETANUS/TDAP  03/31/2022   Pneumonia Vaccine 91+ Years old  Completed   INFLUENZA VACCINE  Completed   DEXA SCAN  Completed   HPV VACCINES  Aged Out    Health Maintenance  Health Maintenance Due  Topic Date Due   Zoster Vaccines- Shingrix (1 of 2) Never done   OPHTHALMOLOGY EXAM  03/01/2021   COVID-19 Vaccine (5 - Booster for Moderna series) 03/26/2021    Colorectal cancer screening: No longer required.   Mammogram status: Completed 08-30-21. Repeat every year  Bone Density status: Completed 08-14-18. Results reflect: Bone density results: NORMAL. Repeat every 5 years.  Lung Cancer Screening: (Low Dose CT Chest recommended if Age 76-80 years, 30 pack-year currently smoking OR have quit w/in 15years.) does not qualify.    Additional Screening:  Hepatitis C Screening: does qualify; Completed   Vision Screening: Recommended annual ophthalmology exams for early detection of glaucoma and other disorders of the eye. Is the patient up to date with their annual eye exam?  Yes  Who is the provider or  what is the name of the office in which the patient attends annual eye exams? [facility] If pt is not established with a provider, would they like to be referred to a provider to establish care? No .   Dental Screening: Recommended annual dental exams for proper oral hygiene  Community Resource Referral / Chronic Care Management: CRR required this visit?  No   CCM required this visit?  No      Plan:     I have personally reviewed and noted the following in the patient's chart:   Medical and social history Use of alcohol, tobacco or illicit drugs  Current medications and supplements including opioid prescriptions.  Functional ability and status Nutritional status Physical activity Advanced directives List of other physicians Hospitalizations,  surgeries, and ER visits in previous 12 months Vitals Screenings to include cognitive, depression, and falls Referrals and appointments  In addition, I have reviewed and discussed with patient certain preventive protocols, quality metrics, and best practice recommendations. A written personalized care plan for preventive services as well as general preventive health recommendations were provided to patient.     Shelda Altes, CMA   09/30/2021   Nurse Notes:  Ms. Denner , Thank you for taking time to come for your Medicare Wellness Visit. I appreciate your ongoing commitment to your health goals. Please review the following plan we discussed and let me know if I can assist you in the future.   These are the goals we discussed:  Goals      DIET - REDUCE SUGAR INTAKE     Exercise 5x per week (60 min per time)     Starting 10/04/2016 patient would like to start back on her exercise bicycle 5 times a week for 60 minutes at a time.        This is a list of the screening recommended for you and due dates:  Health Maintenance  Topic Date Due   Zoster (Shingles) Vaccine (1 of 2) Never done   Eye exam for diabetics  03/01/2021   COVID-19 Vaccine (5 - Booster for Moderna series) 03/26/2021   Hemoglobin A1C  11/04/2021   Complete foot exam   12/21/2021   Tetanus Vaccine  03/31/2022   Pneumonia Vaccine  Completed   Flu Shot  Completed   DEXA scan (bone density measurement)  Completed   HPV Vaccine  Aged Out

## 2021-10-05 ENCOUNTER — Ambulatory Visit (INDEPENDENT_AMBULATORY_CARE_PROVIDER_SITE_OTHER): Payer: Medicare Other | Admitting: Orthopaedic Surgery

## 2021-10-05 ENCOUNTER — Encounter: Payer: Self-pay | Admitting: Orthopaedic Surgery

## 2021-10-05 ENCOUNTER — Other Ambulatory Visit: Payer: Self-pay

## 2021-10-05 DIAGNOSIS — G8929 Other chronic pain: Secondary | ICD-10-CM | POA: Diagnosis not present

## 2021-10-05 DIAGNOSIS — M25561 Pain in right knee: Secondary | ICD-10-CM | POA: Diagnosis not present

## 2021-10-05 NOTE — Progress Notes (Signed)
PROCEDURE NOTE:  The patient requests injections of the right knee , verbal consent was obtained.  The right knee was prepped appropriately after time out was performed.   Sterile technique was observed and injection of 1 cc of DepoMedrol 40mg  with several cc's of plain xylocaine. Anesthesia was provided by ethyl chloride and a 20-gauge needle was used to inject the knee area. The injection was tolerated well.  A band aid dressing was applied.  The patient was advised to apply ice later today and tomorrow to the injection sight as needed.   Encounter Diagnosis  Name Primary?   Chronic pain of right knee Yes   Return prn.  Call if any problem.  Precautions discussed.  She is going to [facility] for the holidays.  Electronically Signed [facility], MD 12/15/20228:48 AM

## 2021-11-08 ENCOUNTER — Other Ambulatory Visit: Payer: Self-pay | Admitting: Nurse Practitioner

## 2021-11-09 ENCOUNTER — Other Ambulatory Visit: Payer: Self-pay | Admitting: Family Medicine

## 2021-11-30 ENCOUNTER — Other Ambulatory Visit: Payer: Self-pay | Admitting: Family Medicine

## 2021-12-05 DIAGNOSIS — E1142 Type 2 diabetes mellitus with diabetic polyneuropathy: Secondary | ICD-10-CM | POA: Diagnosis not present

## 2021-12-05 DIAGNOSIS — B351 Tinea unguium: Secondary | ICD-10-CM | POA: Diagnosis not present

## 2021-12-14 ENCOUNTER — Other Ambulatory Visit: Payer: Self-pay | Admitting: Family Medicine

## 2021-12-19 ENCOUNTER — Other Ambulatory Visit: Payer: Self-pay | Admitting: Family Medicine

## 2021-12-25 DIAGNOSIS — E039 Hypothyroidism, unspecified: Secondary | ICD-10-CM | POA: Diagnosis not present

## 2021-12-26 LAB — T4, FREE: Free T4: 0.83 ng/dL (ref 0.82–1.77)

## 2021-12-26 LAB — TSH: TSH: 4.29 u[IU]/mL (ref 0.450–4.500)

## 2021-12-27 ENCOUNTER — Ambulatory Visit: Payer: Medicare Other | Admitting: Family Medicine

## 2021-12-27 ENCOUNTER — Encounter: Payer: Self-pay | Admitting: Family Medicine

## 2021-12-27 ENCOUNTER — Other Ambulatory Visit: Payer: Self-pay

## 2021-12-27 VITALS — BP 169/73 | HR 69 | Ht 67.0 in | Wt 210.0 lb

## 2021-12-27 DIAGNOSIS — E559 Vitamin D deficiency, unspecified: Secondary | ICD-10-CM

## 2021-12-27 DIAGNOSIS — F439 Reaction to severe stress, unspecified: Secondary | ICD-10-CM

## 2021-12-27 DIAGNOSIS — I480 Paroxysmal atrial fibrillation: Secondary | ICD-10-CM

## 2021-12-27 DIAGNOSIS — E669 Obesity, unspecified: Secondary | ICD-10-CM | POA: Diagnosis not present

## 2021-12-27 DIAGNOSIS — N182 Chronic kidney disease, stage 2 (mild): Secondary | ICD-10-CM | POA: Diagnosis not present

## 2021-12-27 DIAGNOSIS — Z794 Long term (current) use of insulin: Secondary | ICD-10-CM | POA: Diagnosis not present

## 2021-12-27 DIAGNOSIS — E785 Hyperlipidemia, unspecified: Secondary | ICD-10-CM | POA: Diagnosis not present

## 2021-12-27 DIAGNOSIS — E1122 Type 2 diabetes mellitus with diabetic chronic kidney disease: Secondary | ICD-10-CM

## 2021-12-27 DIAGNOSIS — I10 Essential (primary) hypertension: Secondary | ICD-10-CM | POA: Diagnosis not present

## 2021-12-27 MED ORDER — AMLODIPINE BESYLATE 10 MG PO TABS: 10.0000 mg | ORAL_TABLET | Freq: Every day | ORAL | 3 refills | Status: DC

## 2021-12-27 NOTE — Patient Instructions (Addendum)
Follow-up in 8 to 10 weeks call if you need me sooner. ? ?Dose increase in your blood pressure medicine amlodipine to 10 mg 1 daily.  Stop amlodipine 5 mg and 2.5 mg tablets once you get this new dose.  Continue your other blood pressure medications as before.   ? ? ? ?Please get your Shingrix vaccines the indicated and will help to prevent severe shingles should you get this. ? ?Fasting lipid CMP and EGFR and CB and vit D  5 days before your next visit ? ?Please follow through on exercise commitment ? ?Thanks for choosing [facility], we consider it a privelige to serve you. ? ?

## 2021-12-29 ENCOUNTER — Encounter: Payer: Self-pay | Admitting: Family Medicine

## 2021-12-29 NOTE — Assessment & Plan Note (Signed)
Uncontrolled , increase amlodipine to 10 mg daily ?DASH diet and commitment to daily physical activity for a minimum of 30 minutes discussed and encouraged, as a part of hypertension management. ?The importance of attaining a healthy weight is also discussed. ? ?BP/Weight 12/27/2021 09/04/2021 08/09/2021 06/29/2021 05/04/2021 02/08/2021 12/21/2020  ?Systolic BP 169 145 167 143 148 144 -  ?Diastolic BP 73 69 55 65 71 60 -  ?Wt. (Lbs) 210.04 209.2 210.3 206 206 207 199  ?BMI 32.9 32.77 32.94 32.26 32.26 32.42 31.17  ? ? ? ? ?

## 2021-12-29 NOTE — Assessment & Plan Note (Signed)
Rate controlled on current meds and not on blood thinner ?

## 2021-12-29 NOTE — Progress Notes (Signed)
? ?SANOE FRON     MRN: [id number]      DOB: Mar 30, 1941 ? ? ?HPI ?Kimberly Freeman is here for follow up and re-evaluation of chronic medical conditions, medication management and review of any available recent lab and radiology data.  ?Preventive health is updated, specifically  Cancer screening and Immunization.   ?Questions or concerns regarding consultations or procedures which the PT has had in the interim are  addressed. ?The PT denies any adverse reactions to current medications since the last visit.  ?There are no new concerns.  ?There are no specific complaints  ? ?ROS ?Denies recent fever or chills. ?Denies sinus pressure, nasal congestion, ear pain or sore throat. ?Denies chest congestion, productive cough or wheezing. ?Denies chest pains, palpitations and leg swelling ?Denies abdominal pain, nausea, vomiting,diarrhea or constipation.   ?Denies dysuria, frequency, hesitancy or incontinence. ?Denies joint pain, swelling and limitation in mobility. ?Denies headaches, seizures, numbness, or tingling. ?Denies depression, anxiety or insomnia. ?Denies skin break down or rash. ? ? ?PE ? ?BP (!) 169/73   Pulse 69   Ht 5\' 7"  (1.702 m)   Wt 210 lb 0.6 oz (95.3 kg)   SpO2 92%   BMI 32.90 kg/m?  ? ?Patient alert and oriented and in no cardiopulmonary distress. ? ?HEENT: No facial asymmetry, EOMI,     Neck supple . ? ?Chest: Clear to auscultation bilaterally. ? ?CVS: S1, S2 no murmurs, no S3.Regular rate. ? ?ABD: Soft non tender.  ? ?Ext: No edema ? ?MS: Adequate ROM spine, shoulders, hips and knees. ? ?Skin: Intact, no ulcerations or rash noted. ? ?Psych: Good eye contact, normal affect. Memory intact not anxious or depressed appearing. ? ?CNS: CN 2-12 intact, power,  normal throughout.no focal deficits noted. ? ? ?Assessment & Plan ? ?Essential hypertension ?Uncontrolled , increase amlodipine to 10 mg daily ?DASH diet and commitment to daily physical activity for a minimum of 30 minutes discussed and encouraged,  as a part of hypertension management. ?The importance of attaining a healthy weight is also discussed. ? ?BP/Weight 12/27/2021 09/04/2021 08/09/2021 06/29/2021 05/04/2021 02/08/2021 12/21/2020  ?Systolic BP 123XX123 [id number] A999333 148 144 -  ?Diastolic BP 73 69 55 65 71 60 -  ?Wt. (Lbs) 210.04 209.2 210.3 206 206 207 199  ?BMI 32.9 32.77 32.94 32.26 32.26 32.42 31.17  ? ? ? ? ? ?Dyslipidemia, goal LDL below 70 ?Hyperlipidemia:Low fat diet discussed and encouraged. ? ? ?Lipid Panel  ?Lab Results  ?Component Value Date  ? CHOL 156 06/30/2021  ? HDL 47 06/30/2021  ? [place] 90 06/30/2021  ? TRIG 106 06/30/2021  ? CHOLHDL 3.3 06/30/2021  ? ? ? ?Controlled, no change in medication ?Updated lab needed at/ before next visit. ? ? ?Obesity (BMI 30-39.9) ? ?Patient re-educated about  the importance of commitment to a  minimum of 150 minutes of exercise per week as able. ? ?The importance of healthy food choices with portion control discussed, as well as eating regularly and within a 12 hour window most days. ?The need to choose "clean , green" food 50 to 75% of the time is discussed, as well as to make water the primary drink and set a goal of 64 ounces water daily. ? ?  ?Weight /BMI 12/27/2021 09/04/2021 08/09/2021  ?WEIGHT 210 lb 0.6 oz 209 lb 3.2 oz 210 lb 4.8 oz  ?HEIGHT 5\' 7"  5\' 7"  -  ?BMI 32.9 kg/m2 32.77 kg/m2 32.94 kg/m2  ? ? ? ? ?Paroxysmal atrial fibrillation (HCC) ?Rate  controlled on current meds and not on blood thinner ? ?Stress at home ?Improved, spouse's mental health stabilized  ? ?Type 2 diabetes mellitus with stage 2 chronic kidney disease, with long-term current use of insulin ([place]) ?Kimberly Freeman is reminded of the importance of commitment to daily physical activity for 30 minutes or more, as able and the need to limit carbohydrate intake to 30 to 60 grams per meal to help with blood sugar control.  ? ?The need to take medication as prescribed, test blood sugar as directed, and to call between visits if there is a concern  that blood sugar is uncontrolled is also discussed.  ? ?Kimberly Freeman is reminded of the importance of daily foot exam, annual eye examination, and good blood sugar, blood pressure and cholesterol control. ?Managed by Endo, updated lab needed ?Diabetic Labs Latest Ref Rng & Units 06/30/2021 05/04/2021 04/28/2021 01/25/2021 12/21/2020  ?HbA1c 4.0 - 5.6 % - 7.6(A) - - 7.5(A)  ?Microalbumin mg/dL - - - - -  ?Micro/Creat Ratio <30 mcg/mg creat - - - - -  ?Chol 100 - 199 mg/dL 156 - - - -  ?HDL >39 mg/dL 47 - - - -  ?Calc LDL 0 - 99 mg/dL 90 - - - -  ?Triglycerides 0 - 149 mg/dL 106 - - - -  ?Creatinine 0.57 - 1.00 mg/dL 1.37(H) - 1.36(H) 1.26(H) -  ? ?BP/Weight 12/27/2021 09/04/2021 08/09/2021 06/29/2021 05/04/2021 02/08/2021 12/21/2020  ?Systolic BP 123XX123 [id number] [id number] A999333 148 144 -  ?Diastolic BP 73 69 55 65 71 60 -  ?Wt. (Lbs) 210.04 209.2 210.3 206 206 207 199  ?BMI 32.9 32.77 32.94 32.26 32.26 32.42 31.17  ? ?Foot/eye exam completion dates Latest Ref Rng & Units 12/27/2021 03/01/2020  ?Eye Exam No Retinopathy - No Retinopathy  ?Foot exam Order - - -  ?Foot Form Completion - Done -  ? ? ? ? ? ? ?

## 2021-12-29 NOTE — Assessment & Plan Note (Signed)
?  Patient re-educated about  the importance of commitment to a  minimum of 150 minutes of exercise per week as able. ? ?The importance of healthy food choices with portion control discussed, as well as eating regularly and within a 12 hour window most days. ?The need to choose "clean , green" food 50 to 75% of the time is discussed, as well as to make water the primary drink and set a goal of 64 ounces water daily. ? ?  ?Weight /BMI 12/27/2021 09/04/2021 08/09/2021  ?WEIGHT 210 lb 0.6 oz 209 lb 3.2 oz 210 lb 4.8 oz  ?HEIGHT 5\' 7"  5\' 7"  -  ?BMI 32.9 kg/m2 32.77 kg/m2 32.94 kg/m2  ? ? ? ?

## 2021-12-29 NOTE — Assessment & Plan Note (Signed)
Hyperlipidemia:Low fat diet discussed and encouraged. ? ? ?Lipid Panel  ?Lab Results  ?Component Value Date  ? CHOL 156 06/30/2021  ? HDL 47 06/30/2021  ? [place] 90 06/30/2021  ? TRIG 106 06/30/2021  ? CHOLHDL 3.3 06/30/2021  ? ? ? ?Controlled, no change in medication ?Updated lab needed at/ before next visit. ? ?

## 2021-12-29 NOTE — Assessment & Plan Note (Signed)
Improved, spouse's mental health stabilized  ?

## 2021-12-29 NOTE — Assessment & Plan Note (Signed)
[name] is reminded of the importance of commitment to daily physical activity for 30 minutes or more, as able and the need to limit carbohydrate intake to 30 to 60 grams per meal to help with blood sugar control.  ? ?The need to take medication as prescribed, test blood sugar as directed, and to call between visits if there is a concern that blood sugar is uncontrolled is also discussed.  ? ?[name] is reminded of the importance of daily foot exam, annual eye examination, and good blood sugar, blood pressure and cholesterol control. ?Managed by Endo, updated lab needed ?Diabetic Labs Latest Ref Rng & Units 06/30/2021 05/04/2021 04/28/2021 01/25/2021 12/21/2020  ?HbA1c 4.0 - 5.6 % - 7.6(A) - - 7.5(A)  ?Microalbumin mg/dL - - - - -  ?Micro/Creat Ratio <30 mcg/mg creat - - - - -  ?Chol 100 - 199 mg/dL 157 - - - -  ?HDL >26 mg/dL 47 - - - -  ?Calc LDL 0 - 99 mg/dL 90 - - - -  ?Triglycerides 0 - 149 mg/dL 203 - - - -  ?Creatinine 0.57 - 1.00 mg/dL 5.59(R) - 4.16(L) 8.45(X) -  ? ?BP/Weight 12/27/2021 09/04/2021 08/09/2021 06/29/2021 05/04/2021 02/08/2021 12/21/2020  ?Systolic BP 169 145 167 143 148 144 -  ?Diastolic BP 73 69 55 65 71 60 -  ?Wt. (Lbs) 210.04 209.2 210.3 206 206 207 199  ?BMI 32.9 32.77 32.94 32.26 32.26 32.42 31.17  ? ?Foot/eye exam completion dates Latest Ref Rng & Units 12/27/2021 03/01/2020  ?Eye Exam No Retinopathy - No Retinopathy  ?Foot exam Order - - -  ?Foot Form Completion - Done -  ? ? ? ? ? ?

## 2022-01-02 ENCOUNTER — Ambulatory Visit (INDEPENDENT_AMBULATORY_CARE_PROVIDER_SITE_OTHER): Payer: Medicare Other | Admitting: Nurse Practitioner

## 2022-01-02 ENCOUNTER — Other Ambulatory Visit: Payer: Self-pay

## 2022-01-02 ENCOUNTER — Encounter: Payer: Self-pay | Admitting: Nurse Practitioner

## 2022-01-02 VITALS — BP 127/67 | HR 65 | Ht 67.0 in | Wt 209.2 lb

## 2022-01-02 DIAGNOSIS — E039 Hypothyroidism, unspecified: Secondary | ICD-10-CM

## 2022-01-02 DIAGNOSIS — N182 Chronic kidney disease, stage 2 (mild): Secondary | ICD-10-CM | POA: Diagnosis not present

## 2022-01-02 DIAGNOSIS — E559 Vitamin D deficiency, unspecified: Secondary | ICD-10-CM | POA: Diagnosis not present

## 2022-01-02 DIAGNOSIS — E1122 Type 2 diabetes mellitus with diabetic chronic kidney disease: Secondary | ICD-10-CM

## 2022-01-02 DIAGNOSIS — E782 Mixed hyperlipidemia: Secondary | ICD-10-CM | POA: Diagnosis not present

## 2022-01-02 DIAGNOSIS — Z794 Long term (current) use of insulin: Secondary | ICD-10-CM

## 2022-01-02 DIAGNOSIS — I10 Essential (primary) hypertension: Secondary | ICD-10-CM

## 2022-01-02 LAB — POCT GLYCOSYLATED HEMOGLOBIN (HGB A1C): HbA1c, POC (controlled diabetic range): 10.2 % — AB (ref 0.0–7.0)

## 2022-01-02 MED ORDER — GLIPIZIDE ER 2.5 MG PO TB24: 2.5000 mg | ORAL_TABLET | Freq: Every day | ORAL | 1 refills | Status: DC

## 2022-01-02 NOTE — Progress Notes (Signed)
? ?                                                  ?     01/02/2022, 10:04 AM ? ?Endocrinology follow-up note ? ?Subjective:  ? ? Patient ID: Kimberly Freeman, female    DOB: Sep 25, 1941.  ?Kimberly Freeman is being seen in consultation for management of currently uncontrolled symptomatic diabetes requested by  Fayrene Helper, MD. ? ? ?Past Medical History:  ?Diagnosis Date  ? Arthritis   ? CAD (coronary artery disease) 08/2019  ? 60% RI with other 20-30% lesions at cath  ? Diabetes mellitus, type 2 ([place])   ? Essential hypertension   ? GERD (gastroesophageal reflux disease)   ? Hyperlipidemia   ? LVH (left ventricular hypertrophy)   ? ? ?Past Surgical History:  ?Procedure Laterality Date  ? ABDOMINAL HYSTERECTOMY    ? BIOPSY  07/18/2017  ? Procedure: BIOPSY;  Surgeon: Danie Binder, MD;  Location: [facility];  Service: Endoscopy;;  gastric  ? CHOLECYSTECTOMY  2004  ? COLONOSCOPY N/A 07/18/2017  ? Dr. Oneida Alar: Internal and external hemorrhoids.  No future screening/surveillance colonoscopies due to age.  ? ESOPHAGOGASTRODUODENOSCOPY N/A 07/18/2017  ? Dr. Oneida Alar: Small hiatal hernia, patchy mild inflammation characterized by congestion, erosions, erythema in the cardia, gastric body and antrum.  Diffuse moderate inflammation in the duodenal bulb and second portion duodenum.  Biopsies benign.  No H. pylori.  ? LEFT HEART CATH AND CORONARY ANGIOGRAPHY N/A 09/21/2019  ? Procedure: LEFT HEART CATH AND CORONARY ANGIOGRAPHY;  Surgeon: Troy Sine, MD;  Location: [place] [facility];  Service: Cardiovascular;  Laterality: N/A;  ? ? ?Social History  ? ?Socioeconomic History  ? Marital status: Married  ?  Spouse name: Winferd Humphrey   ? Number of children: 0  ? Years of education: 12+  ? Highest education level: 12th grade  ?Occupational History  ? Occupation: retired   ?Tobacco Use  ? Smoking status: Never  ? Smokeless tobacco: Never  ?Vaping Use  ? Vaping Use: Never used  ?Substance and Sexual Activity  ?  Alcohol use: No  ?  Alcohol/week: 0.0 standard drinks  ? Drug use: No  ? Sexual activity: Not on file  ?Other Topics Concern  ? Not on file  ?Social History Narrative  ? Lives alone with husband   ? ?Social Determinants of Health  ? ?Financial Resource Strain: Low Risk   ? Difficulty of Paying Living Expenses: Not hard at all  ?Food Insecurity: No Food Insecurity  ? Worried About Charity fundraiser in the Last Year: Never true  ? Ran Out of Food in the Last Year: Never true  ?Transportation Needs: No Transportation Needs  ? Lack of Transportation (Medical): No  ? Lack of Transportation (Non-Medical): No  ?Physical Activity: Insufficiently Active  ? Days of Exercise per Week: 3 days  ? Minutes of Exercise per Session: 30 min  ?Stress: No Stress Concern Present  ? Feeling of Stress : Not at all  ?Social Connections: Socially Integrated  ? Frequency of Communication with Friends and Family: More than three times a week  ? Frequency of Social Gatherings with Friends and Family: More than three times a week  ? Attends Religious Services: More than 4 times per year  ? Active Member of Clubs  or Organizations: Yes  ? Attends Archivist Meetings: More than 4 times per year  ? Marital Status: Married  ? ? ?Family History  ?Problem Relation Age of Onset  ? Leukemia Mother   ? Cancer Mother   ? Diabetes Mother   ? Prostate cancer Father   ? Lung cancer Sister 77  ? Diabetes Sister   ? Colon cancer Neg Hx   ? ? ?Outpatient Encounter Medications as of 01/02/2022  ?Medication Sig  ? acetaminophen (TYLENOL) 500 MG tablet Take 500 mg by mouth daily as needed for mild pain or moderate pain. For pain  ? amLODipine (NORVASC) 10 MG tablet Take 1 tablet (10 mg total) by mouth daily.  ? aspirin 81 MG chewable tablet Chew 1 tablet (81 mg total) by mouth daily.  ? atorvastatin (LIPITOR) 80 MG tablet TAKE 1 TABLET DAILY AT 6 P.M.  ? BD PEN NEEDLE MICRO U/F 32G X 6 MM MISC USE TO INJECT INSULIN DAILY  ? betamethasone dipropionate  0.05 % cream Apply topically 2 (two) times daily.  ? calcium-vitamin D (OSCAL WITH D) 500-200 MG-UNIT tablet Take 1 tablet by mouth.  ? Easy Touch Safety Lancets 28G MISC Twice daily testing dx E11.65  ? irbesartan (AVAPRO) 300 MG tablet TAKE 1 TABLET DAILY  ? Lancets (ONETOUCH ULTRASOFT) lancets USE AS DIRECTED TWICE DAILY  ? levothyroxine (SYNTHROID) 25 MCG tablet Take 1 tablet (25 mcg total) by mouth daily.  ? metoprolol tartrate (LOPRESSOR) 100 MG tablet TAKE 1 TABLET TWICE A DAY  ? Multiple Vitamin (MULTIVITAMIN WITH MINERALS) TABS tablet Take 1 tablet by mouth daily.  ? ONETOUCH ULTRA test strip USE AS INSTRUCTED FOR TWICE A DAY TESTING  ? pantoprazole (PROTONIX) 40 MG tablet TAKE 1 TABLET 30 MINUTES BEFORE FIRST MEAL  ? polyethylene glycol (MIRALAX / GLYCOLAX) 17 g packet Take 17 g by mouth daily as needed.  ? spironolactone (ALDACTONE) 50 MG tablet TAKE 1 TABLET DAILY  ? TRESIBA FLEXTOUCH 100 UNIT/ML FlexTouch Pen INJECT 38 UNITS UNDER THE SKIN DAILY  ? vitamin B-12 (CYANOCOBALAMIN) 500 MCG tablet Take 500 mcg by mouth daily.  ? [DISCONTINUED] glipiZIDE (GLUCOTROL XL) 2.5 MG 24 hr tablet TAKE 1 TABLET DAILY WITH BREAKFAST  ? glipiZIDE (GLUCOTROL XL) 2.5 MG 24 hr tablet Take 1 tablet (2.5 mg total) by mouth daily with breakfast.  ? ?No facility-administered encounter medications on file as of 01/02/2022.  ? ? ?ALLERGIES: ?Allergies  ?Allergen Reactions  ? Poison Oak Extract [Poison Oak Extract] Hives  ? Sulfonamide Derivatives Itching  ?  Burning sensation all over body  ? Levemir [Insulin Detemir] Rash  ? ? ?VACCINATION STATUS: ?Immunization History  ?Administered Date(s) Administered  ? Fluad Quad(high Dose 65+) 07/27/2019, 06/23/2020, 06/29/2021  ? Influenza Split 08/01/2011, 07/31/2012  ? Influenza Whole 07/29/2007, 07/20/2008, 07/21/2009, 07/19/2010  ? Influenza, High Dose Seasonal PF 07/23/2018  ? Influenza,inj,Quad PF,6+ Mos 08/07/2013, 06/15/2014, 07/19/2015, 06/28/2016, 07/25/2017  ? Moderna SARS-COV2  Booster Vaccination 07/26/2021  ? Moderna Sars-Covid-2 Vaccination 12/22/2019, 01/19/2020, 09/25/2020, 01/29/2021  ? Pneumococcal Conjugate-13 10/12/2014  ? Pneumococcal Polysaccharide-23 03/16/2004, 08/29/2012  ? Td 03/16/2004  ? Tdap 03/31/2012  ? Zoster, Live 01/02/2007  ? ? ?Diabetes ?She presents for her follow-up diabetic visit. She has type 2 diabetes mellitus. Onset time: She was diagnosed at approximate age of 42 years. Her disease course has been stable. There are no hypoglycemic associated symptoms. Pertinent negatives for hypoglycemia include no confusion, headaches, pallor or seizures. Associated symptoms include fatigue. Pertinent negatives  for diabetes include no chest pain, no polydipsia, no polyphagia and no polyuria. There are no hypoglycemic complications. Symptoms are stable. Diabetic complications include heart disease and nephropathy. Risk factors for coronary artery disease include diabetes mellitus, dyslipidemia, family history, hypertension, obesity, sedentary lifestyle and post-menopausal. Current diabetic treatment includes insulin injections and oral agent (monotherapy). She is compliant with treatment most of the time. Her weight is fluctuating minimally. She is following a generally healthy diet. When asked about meal planning, she reported none. She has had a previous visit with a dietitian. She participates in exercise intermittently. Her home blood glucose trend is fluctuating minimally. Her breakfast blood glucose range is generally 110-130 mg/dl. Her bedtime blood glucose range is generally 130-140 mg/dl. (She presents today with her meter and logs showing stable, at goal, glycemic profile overall.  Her POCT A1c today is 10.2%, worsening from previous visit of 7.6% but she does have chronic anemia which may skew these results.  Analysis of her meter shows 7-day average of 116, 14-day average of 124, and 30-day average of 133.  She denies any hypoglycemia.) An ACE  inhibitor/angiotensin II receptor blocker is not being taken. She sees a podiatrist.Eye exam is current.  ?Hyperlipidemia ?This is a chronic problem. The current episode started more than 1 year ago. The problem is contr

## 2022-01-02 NOTE — Patient Instructions (Signed)

## 2022-02-02 ENCOUNTER — Inpatient Hospital Stay (HOSPITAL_COMMUNITY): Payer: Medicare Other

## 2022-02-05 ENCOUNTER — Inpatient Hospital Stay (HOSPITAL_COMMUNITY): Payer: Medicare Other | Attending: Hematology

## 2022-02-05 DIAGNOSIS — R748 Abnormal levels of other serum enzymes: Secondary | ICD-10-CM

## 2022-02-05 DIAGNOSIS — E611 Iron deficiency: Secondary | ICD-10-CM | POA: Insufficient documentation

## 2022-02-05 DIAGNOSIS — N183 Chronic kidney disease, stage 3 unspecified: Secondary | ICD-10-CM | POA: Diagnosis not present

## 2022-02-05 DIAGNOSIS — D631 Anemia in chronic kidney disease: Secondary | ICD-10-CM | POA: Diagnosis not present

## 2022-02-05 DIAGNOSIS — R7989 Other specified abnormal findings of blood chemistry: Secondary | ICD-10-CM | POA: Diagnosis not present

## 2022-02-05 LAB — CBC WITH DIFFERENTIAL/PLATELET
Abs Immature Granulocytes: 0.01 10*3/uL (ref 0.00–0.07)
Basophils Absolute: 0 10*3/uL (ref 0.0–0.1)
Basophils Relative: 1 %
Eosinophils Absolute: 0.1 10*3/uL (ref 0.0–0.5)
Eosinophils Relative: 3 %
HCT: 35 % — ABNORMAL LOW (ref 36.0–46.0)
Hemoglobin: 11.2 g/dL — ABNORMAL LOW (ref 12.0–15.0)
Immature Granulocytes: 0 %
Lymphocytes Relative: 38 %
Lymphs Abs: 2 10*3/uL (ref 0.7–4.0)
MCH: 30 pg (ref 26.0–34.0)
MCHC: 32 g/dL (ref 30.0–36.0)
MCV: 93.8 fL (ref 80.0–100.0)
Monocytes Absolute: 0.6 10*3/uL (ref 0.1–1.0)
Monocytes Relative: 11 %
Neutro Abs: 2.5 10*3/uL (ref 1.7–7.7)
Neutrophils Relative %: 47 %
Platelets: 186 10*3/uL (ref 150–400)
RBC: 3.73 MIL/uL — ABNORMAL LOW (ref 3.87–5.11)
RDW: 12.9 % (ref 11.5–15.5)
WBC: 5.2 10*3/uL (ref 4.0–10.5)
nRBC: 0 % (ref 0.0–0.2)

## 2022-02-05 LAB — COMPREHENSIVE METABOLIC PANEL
ALT: 51 U/L — ABNORMAL HIGH (ref 0–44)
AST: 34 U/L (ref 15–41)
Albumin: 3.7 g/dL (ref 3.5–5.0)
Alkaline Phosphatase: 88 U/L (ref 38–126)
Anion gap: 6 (ref 5–15)
BUN: 22 mg/dL (ref 8–23)
CO2: 28 mmol/L (ref 22–32)
Calcium: 9.3 mg/dL (ref 8.9–10.3)
Chloride: 104 mmol/L (ref 98–111)
Creatinine, Ser: 1.15 mg/dL — ABNORMAL HIGH (ref 0.44–1.00)
GFR, Estimated: 48 mL/min — ABNORMAL LOW (ref >60)
Glucose, Bld: 346 mg/dL — ABNORMAL HIGH (ref 70–99)
Potassium: 4.5 mmol/L (ref 3.5–5.1)
Sodium: 138 mmol/L (ref 135–145)
Total Bilirubin: 0.6 mg/dL (ref 0.3–1.2)
Total Protein: 6.9 g/dL (ref 6.5–8.1)

## 2022-02-05 LAB — IRON AND TIBC
Iron: 96 ug/dL (ref 28–170)
Saturation Ratios: 31 % (ref 10.4–31.8)
TIBC: 312 ug/dL (ref 250–450)
UIBC: 216 ug/dL

## 2022-02-05 LAB — FERRITIN: Ferritin: 367 ng/mL — ABNORMAL HIGH (ref 11–307)

## 2022-02-07 LAB — METHYLMALONIC ACID, SERUM: Methylmalonic Acid, Quantitative: 197 nmol/L (ref 0–378)

## 2022-02-09 ENCOUNTER — Telehealth (HOSPITAL_COMMUNITY): Payer: Medicare Other | Admitting: Physician Assistant

## 2022-02-12 NOTE — Progress Notes (Signed)
? ?Virtual Visit via Telephone Note ?[place] ? ?I connected with Kimberly Freeman  on 02/13/22 at  1:40 PM  by telephone and verified that I am speaking with the correct person using two identifiers. ? ?Location: ?Patient: Home ?Provider: [facility] ?  ?I discussed the limitations, risks, security and privacy concerns of performing an evaluation and management service by telephone and the availability of in person appointments. I also discussed with the patient that there may be a patient responsible charge related to this service. The patient expressed understanding and agreed to proceed. ? ? ?REASON FOR VISIT:  ?Follow-up for anemia of iron deficiency and CKD ?  ?CURRENT THERAPY: Observation ?  ?INTERVAL HISTORY:  ?Kimberly Freeman 81 y.o. female returns for routine follow-up of her anemia secondary to CKD stage III and iron deficiency.  She was last seen by Tarri Abernethy PA-C on 08/09/2021. ? ?At today's visit, she reports that she is doing well .  She denies any recent hospitalizations, new diagnoses, or changes in her baseline health status. ? ?She reports that she is no longer taking oral iron or B12 supplements at home.   ?She has not noted any major blood loss such as hematemesis, hematochezia, or melena. ?She denies any abnormal fatigue, pica, restless legs, chest pain, dyspnea exertion, lightheadedness, or syncope.  She has no complaints at the time of today's visit. ?  ?She has 75% energy and  100% appetite. She endorses that she is maintaining a stable weight. ? ?  ?OBSERVATIONS/OBJECTIVE: ?Review of Systems  ?Constitutional:  Negative for chills, diaphoresis, fever, malaise/fatigue and weight loss.  ?Respiratory:  Positive for cough (occasional). Negative for shortness of breath.   ?Cardiovascular:  Negative for chest pain and palpitations.  ?Gastrointestinal:  Negative for abdominal pain, blood in stool, melena, nausea and vomiting.  ?Neurological:  Positive for headaches  (occasional). Negative for dizziness.   ? ?PHYSICAL EXAM (per limitations of virtual telephone visit): The patient is alert and oriented x 3, exhibiting adequate mentation, good mood, and ability to speak in full sentences and execute sound judgement. ? ? ?ASSESSMENT & PLAN: ?1.  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 pylori ?- Colonoscopy (07/18/2017): Internal and external hemorrhoids ?- SPEP and IFE in 2018 were negative for monoclonal gammopathy ?- No major bleeding events such as hematemesis, hematochezia, or melena  ?- Energy is at baseline.  No pica. ?- Most recent labs (02/05/2022): Hgb 11.3/MCV 93.8, ferritin 367, iron saturation 31% ?- Anemia is primarily secondary to CKD stage III, but hemoglobin remains at goal.  If Hgb drops to 9.0-10.0, would consider starting patient on ESA. ?- PLAN: Repeat labs (CBC, CMP, iron panel, B12, folate) in 6 months with phone visit after.   ?  ?2.  Chronic kidney disease, stage III ?- CMP (02/05/2022) shows creatinine 1.15/GFR 48, in keeping with her CKD stage IIIb. ?- Kidney function appears to be at her baseline CKD ?- PLAN: Continue to encourage adequate water intake and avoidance of NSAIDs.  Patient can discuss with PCP regarding whether or not she needs referral to nephrologist. ?  ?3.  History of elevated LFTs  ?- LFTs were noted to be minimally elevated at her last visit November 2021 ?- Appears to be self-limited and has resolved without intervention ?- PLAN: Unknown etiology, but no further  work-up needed at this time. ? ? ?FOLLOW UP INSTRUCTIONS: ?Labs in 6 months ?RTC after labs ? ?  ?I discussed the assessment and treatment plan with the patient. The patient was provided an opportunity to ask questions and all were answered. The patient agreed with the plan and demonstrated an understanding of the  instructions. ?  ?The patient was advised to call back or seek an in-person evaluation if the symptoms worsen or if the condition fails to improve as anticipated. ? ?I provided 15 minutes of non-face-to-face time during this encounter. ? ? ?[name], PA-C ?02/13/22 1:56 PM  ?

## 2022-02-13 ENCOUNTER — Inpatient Hospital Stay (HOSPITAL_BASED_OUTPATIENT_CLINIC_OR_DEPARTMENT_OTHER): Payer: Medicare Other | Admitting: Physician Assistant

## 2022-02-13 ENCOUNTER — Other Ambulatory Visit: Payer: Self-pay | Admitting: Nurse Practitioner

## 2022-02-13 DIAGNOSIS — D631 Anemia in chronic kidney disease: Secondary | ICD-10-CM | POA: Diagnosis not present

## 2022-02-13 DIAGNOSIS — R7989 Other specified abnormal findings of blood chemistry: Secondary | ICD-10-CM

## 2022-02-13 DIAGNOSIS — N183 Chronic kidney disease, stage 3 unspecified: Secondary | ICD-10-CM

## 2022-02-13 DIAGNOSIS — D649 Anemia, unspecified: Secondary | ICD-10-CM

## 2022-02-15 DIAGNOSIS — B351 Tinea unguium: Secondary | ICD-10-CM | POA: Diagnosis not present

## 2022-02-15 DIAGNOSIS — E1142 Type 2 diabetes mellitus with diabetic polyneuropathy: Secondary | ICD-10-CM | POA: Diagnosis not present

## 2022-02-19 DIAGNOSIS — Z794 Long term (current) use of insulin: Secondary | ICD-10-CM | POA: Diagnosis not present

## 2022-02-19 DIAGNOSIS — E1122 Type 2 diabetes mellitus with diabetic chronic kidney disease: Secondary | ICD-10-CM | POA: Diagnosis not present

## 2022-02-19 DIAGNOSIS — E785 Hyperlipidemia, unspecified: Secondary | ICD-10-CM | POA: Diagnosis not present

## 2022-02-19 DIAGNOSIS — N182 Chronic kidney disease, stage 2 (mild): Secondary | ICD-10-CM | POA: Diagnosis not present

## 2022-02-19 DIAGNOSIS — E559 Vitamin D deficiency, unspecified: Secondary | ICD-10-CM | POA: Diagnosis not present

## 2022-02-20 LAB — CBC
Hematocrit: 35.5 % (ref 34.0–46.6)
Hemoglobin: 11.7 g/dL (ref 11.1–15.9)
MCH: 29.6 pg (ref 26.6–33.0)
MCHC: 33 g/dL (ref 31.5–35.7)
MCV: 90 fL (ref 79–97)
Platelets: 213 10*3/uL (ref 150–450)
RBC: 3.95 x10E6/uL (ref 3.77–5.28)
RDW: 12.2 % (ref 11.7–15.4)
WBC: 5.9 10*3/uL (ref 3.4–10.8)

## 2022-02-20 LAB — CMP14+EGFR
ALT: 24 IU/L (ref 0–32)
AST: 20 IU/L (ref 0–40)
Albumin/Globulin Ratio: 1.8 (ref 1.2–2.2)
Albumin: 4.2 g/dL (ref 3.6–4.6)
Alkaline Phosphatase: 113 IU/L (ref 44–121)
BUN/Creatinine Ratio: 16 (ref 12–28)
BUN: 18 mg/dL (ref 8–27)
Bilirubin Total: 0.3 mg/dL (ref 0.0–1.2)
CO2: 24 mmol/L (ref 20–29)
Calcium: 10.3 mg/dL (ref 8.7–10.3)
Chloride: 107 mmol/L — ABNORMAL HIGH (ref 96–106)
Creatinine, Ser: 1.15 mg/dL — ABNORMAL HIGH (ref 0.57–1.00)
Globulin, Total: 2.3 g/dL (ref 1.5–4.5)
Glucose: 121 mg/dL — ABNORMAL HIGH (ref 70–99)
Potassium: 4.2 mmol/L (ref 3.5–5.2)
Sodium: 145 mmol/L — ABNORMAL HIGH (ref 134–144)
Total Protein: 6.5 g/dL (ref 6.0–8.5)
eGFR: 48 mL/min/{1.73_m2} — ABNORMAL LOW (ref >59)

## 2022-02-20 LAB — VITAMIN D 25 HYDROXY (VIT D DEFICIENCY, FRACTURES): Vit D, 25-Hydroxy: 44.8 ng/mL (ref 30.0–100.0)

## 2022-02-22 ENCOUNTER — Ambulatory Visit: Payer: Medicare Other | Admitting: Family Medicine

## 2022-02-22 ENCOUNTER — Encounter: Payer: Self-pay | Admitting: Family Medicine

## 2022-02-22 VITALS — BP 138/70 | HR 65 | Ht 67.0 in | Wt 212.1 lb

## 2022-02-22 DIAGNOSIS — I2510 Atherosclerotic heart disease of native coronary artery without angina pectoris: Secondary | ICD-10-CM

## 2022-02-22 DIAGNOSIS — I5032 Chronic diastolic (congestive) heart failure: Secondary | ICD-10-CM

## 2022-02-22 DIAGNOSIS — I130 Hypertensive heart and chronic kidney disease with heart failure and stage 1 through stage 4 chronic kidney disease, or unspecified chronic kidney disease: Secondary | ICD-10-CM | POA: Diagnosis not present

## 2022-02-22 DIAGNOSIS — Z794 Long term (current) use of insulin: Secondary | ICD-10-CM | POA: Diagnosis not present

## 2022-02-22 DIAGNOSIS — N1831 Chronic kidney disease, stage 3a: Secondary | ICD-10-CM

## 2022-02-22 DIAGNOSIS — E1165 Type 2 diabetes mellitus with hyperglycemia: Secondary | ICD-10-CM | POA: Diagnosis not present

## 2022-02-22 DIAGNOSIS — E785 Hyperlipidemia, unspecified: Secondary | ICD-10-CM | POA: Diagnosis not present

## 2022-02-22 DIAGNOSIS — E669 Obesity, unspecified: Secondary | ICD-10-CM

## 2022-02-22 NOTE — Assessment & Plan Note (Signed)
Controlled, no change in medication ?DASH diet and commitment to daily physical activity for a minimum of 30 minutes discussed and encouraged, as a part of hypertension management. ?The importance of attaining a healthy weight is also discussed. ? ? ?  02/22/2022  ? 10:57 AM 02/22/2022  ? 10:25 AM 01/02/2022  ?  9:27 AM 12/27/2021  ?  9:53 AM 09/04/2021  ?  8:55 AM 08/09/2021  ? 11:43 AM 06/29/2021  ?  9:53 AM  ?BP/Weight  ?Systolic BP 0000000 [id number] 145 167 143  ?Diastolic BP 70 73 67 73 69 55 65  ?Wt. (Lbs)  212.08 209.2 210.04 209.2 210.3 206  ?BMI  33.22 kg/m2 32.77 kg/m2 32.9 kg/m2 32.77 kg/m2 32.94 kg/m2 32.26 kg/m2  ? ? ? ? ?

## 2022-02-22 NOTE — Progress Notes (Signed)
? ?Kimberly Freeman     MRN: [id number]      DOB: 1941/06/01 ? ? ?HPI ?Ms. Stick is here for follow up and re-evaluation of chronic medical conditions, medication management and review of any available recent lab and radiology data.  ?Preventive health is updated, specifically  Cancer screening and Immunization.   ?Blood sugar markedly elevated when recently checked by Endo, and numbers in meter not concordant with the HBA1C ?The PT denies any adverse reactions to current medications since the last visit.  ? ?ROS ?Denies recent fever or chills. ?Denies sinus pressure, nasal congestion, ear pain or sore throat. ?Denies chest congestion, productive cough or wheezing. ?Denies chest pains, palpitations and leg swelling ?Denies abdominal pain, nausea, vomiting,diarrhea or constipation.   ?Denies dysuria, frequency, hesitancy or incontinence. ?Denies  uncontrolled joint pain, swelling and limitation in mobility. ?Denies headaches, seizures, numbness, or tingling. ?Denies depression, anxiety or insomnia. ?Denies skin break down or rash. ? ? ?PE ? ?BP 138/70   Pulse 65   Ht 5\' 7"  (1.702 m)   Wt 212 lb 1.3 oz (96.2 kg)   SpO2 95%   BMI 33.22 kg/m?  ? ?Patient alert and oriented and in no cardiopulmonary distress. ? ?HEENT: No facial asymmetry, EOMI,     Neck supple . ? ?Chest: Clear to auscultation bilaterally. ? ?CVS: S1, S2 no murmurs, no S3.Regular rate. ? ?ABD: Soft non tender.  ? ?Ext: No edema ? ?MS: Adequate  though reduced ROM spine, shoulders, hips and knees. ? ?Skin: Intact, no ulcerations or rash noted. ? ?Psych: Good eye contact, normal affect. Memory intact not anxious or depressed appearing. ? ?CNS: CN 2-12 intact, power,  normal throughout.no focal deficits noted. ? ? ?Assessment & Plan ? ?Uncontrolled type 2 diabetes mellitus with hyperglycemia, with long-term current use of insulin ([place]) ?Uncontrolled and deteriorated,manged by Endo will reach out due toconcern ?[name] is reminded of the importance  of commitment to daily physical activity for 30 minutes or more, as able and the need to limit carbohydrate intake to 30 to 60 grams per meal to help with blood sugar control.  ? ?The need to take medication as prescribed, test blood sugar as directed, and to call between visits if there is a concern that blood sugar is uncontrolled is also discussed.  ? ?[name] is reminded of the importance of daily foot exam, annual eye examination, and good blood sugar, blood pressure and cholesterol control. ? ? ?  Latest Ref Rng & Units 02/19/2022  ? 10:38 AM 02/05/2022  ? 11:11 AM 01/02/2022  ?  9:47 AM 06/30/2021  ?  8:37 AM 05/04/2021  ?  9:18 AM  ?Diabetic Labs  ?HbA1c 0.0 - 7.0 %   10.2    7.6    ?Chol 100 - 199 mg/dL    156     ?HDL >39 mg/dL    47     ?Calc LDL 0 - 99 mg/dL    90     ?Triglycerides 0 - 149 mg/dL    106     ?Creatinine 0.57 - 1.00 mg/dL 1.15   1.15    1.37     ? ? ?  02/22/2022  ? 10:57 AM 02/22/2022  ? 10:25 AM 01/02/2022  ?  9:27 AM 12/27/2021  ?  9:53 AM 09/04/2021  ?  8:55 AM 08/09/2021  ? 11:43 AM 06/29/2021  ?  9:53 AM  ?BP/Weight  ?Systolic BP 0000000 [id number] 145 167 143  ?  Diastolic BP 70 73 67 73 69 55 65  ?Wt. (Lbs)  212.08 209.2 210.04 209.2 210.3 206  ?BMI  33.22 kg/m2 32.77 kg/m2 32.9 kg/m2 32.77 kg/m2 32.94 kg/m2 32.26 kg/m2  ? ? ?  Latest Ref Rng & Units 12/27/2021  ?  9:40 AM 03/01/2020  ? 12:00 AM  ?Foot/eye exam completion dates  ?Eye Exam No Retinopathy  No Retinopathy       ?Foot Form Completion  Done   ?  ? This result is from an external source.  ? ? ? ? ? ? 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 ?The importance of attaining a healthy weight is also discussed. ? ? ?  02/22/2022  ? 10:57 AM 02/22/2022  ? 10:25 AM 01/02/2022  ?  9:27 AM  12/27/2021  ?  9:53 AM 09/04/2021  ?  8:55 AM 08/09/2021  ? 11:43 AM 06/29/2021  ?  9:53 AM  ?BP/Weight  ?Systolic BP 0000000 [id number] 145 167 143  ?Diastolic BP 70 73 67 73 69 55 65  ?Wt. (Lbs)  212.08 209.2 210.04 209.2 210.3 206  ?BMI  33.22 kg/m2 32.77 kg/m2 32.9 kg/m2 32.77 kg/m2 32.94 kg/m2 32.26 kg/m2  ? ? ? ? ? ?Dyslipidemia, goal LDL below 70 ?Hyperlipidemia:Low fat diet discussed and encouraged. ? ? ?Lipid Panel  ?Lab Results  ?Component Value Date  ? CHOL 156 06/30/2021  ? HDL 47 06/30/2021  ? [place] 90 06/30/2021  ? TRIG 106 06/30/2021  ? CHOLHDL 3.3 06/30/2021  ? ? ? ? wil add lipid panel to update ? ?CAD (coronary artery disease) ?Stable denies recent chest pain, or exercise intolerance ? ?Obesity (BMI 30-39.9) ? ?Patient re-educated about  the importance of commitment to a  minimum of 150 minutes of exercise per week as able. ? ?The importance of healthy food choices with portion control discussed, as well as eating regularly and within a 12 hour window most days. ?The need to choose "clean , green" food 50 to 75% of the time is discussed, as well as to make water the primary drink and set a goal of 64 ounces water daily. ? ?  ? ?  02/22/2022  ? 10:25 AM 01/02/2022  ?  9:27 AM 12/27/2021  ?  9:53 AM  ?Weight /BMI  ?Weight 212 lb 1.3 oz 209 lb 3.2 oz 210 lb 0.6 oz  ?Height 5\' 7"  (1.702 m) 5\' 7"  (1.702 m) 5\' 7"  (1.702 m)  ?BMI 33.22 kg/m2 32.77 kg/m2 32.9 kg/m2  ? ? ? ? ? ? ?

## 2022-02-22 NOTE — Assessment & Plan Note (Signed)
Hyperlipidemia:Low fat diet discussed and encouraged. ? ? ?Lipid Panel  ?Lab Results  ?Component Value Date  ? CHOL 156 06/30/2021  ? HDL 47 06/30/2021  ? LDLCALC 90 06/30/2021  ? TRIG 106 06/30/2021  ? CHOLHDL 3.3 06/30/2021  ? ? ? ? wil add lipid panel to update ?

## 2022-02-22 NOTE — Assessment & Plan Note (Signed)
Stable denies recent chest pain, or exercise intolerance ?

## 2022-02-22 NOTE — Patient Instructions (Signed)
F/U in mid October, call if you need me sooner ? ? ?Nurse Please add lipid panel to recent lab ? ?Blood pressure controlled  ? ?Continue regular exercise ? ?Please get shingrix vaccines at your pharmacy ? ?Thanks for choosing [facility], we consider it a privelige to serve you. ? ? ?

## 2022-02-22 NOTE — Assessment & Plan Note (Signed)
?  Patient re-educated about  the importance of commitment to a  minimum of 150 minutes of exercise per week as able. ? ?The importance of healthy food choices with portion control discussed, as well as eating regularly and within a 12 hour window most days. ?The need to choose "clean , green" food 50 to 75% of the time is discussed, as well as to make water the primary drink and set a goal of 64 ounces water daily. ? ?  ? ?  02/22/2022  ? 10:25 AM 01/02/2022  ?  9:27 AM 12/27/2021  ?  9:53 AM  ?Weight /BMI  ?Weight 212 lb 1.3 oz 209 lb 3.2 oz 210 lb 0.6 oz  ?Height 5\' 7"  (1.702 m) 5\' 7"  (1.702 m) 5\' 7"  (1.702 m)  ?BMI 33.22 kg/m2 32.77 kg/m2 32.9 kg/m2  ? ? ? ?

## 2022-02-22 NOTE — Assessment & Plan Note (Signed)
Uncontrolled and deteriorated,manged by Endo will reach out due toconcern ?Kimberly Freeman is reminded of the importance of commitment to daily physical activity for 30 minutes or more, as able and the need to limit carbohydrate intake to 30 to 60 grams per meal to help with blood sugar control.  ? ?The need to take medication as prescribed, test blood sugar as directed, and to call between visits if there is a concern that blood sugar is uncontrolled is also discussed.  ? ?Kimberly Freeman is reminded of the importance of daily foot exam, annual eye examination, and good blood sugar, blood pressure and cholesterol control. ? ? ?  Latest Ref Rng & Units 02/19/2022  ? 10:38 AM 02/05/2022  ? 11:11 AM 01/02/2022  ?  9:47 AM 06/30/2021  ?  8:37 AM 05/04/2021  ?  9:18 AM  ?Diabetic Labs  ?HbA1c 0.0 - 7.0 %   10.2    7.6    ?Chol 100 - 199 mg/dL    466     ?HDL >39 mg/dL    47     ?Calc LDL 0 - 99 mg/dL    90     ?Triglycerides 0 - 149 mg/dL    599     ?Creatinine 0.57 - 1.00 mg/dL 3.57   0.17    7.93     ? ? ?  02/22/2022  ? 10:57 AM 02/22/2022  ? 10:25 AM 01/02/2022  ?  9:27 AM 12/27/2021  ?  9:53 AM 09/04/2021  ?  8:55 AM 08/09/2021  ? 11:43 AM 06/29/2021  ?  9:53 AM  ?BP/Weight  ?Systolic BP 138 149 127 169 145 167 143  ?Diastolic BP 70 73 67 73 69 55 65  ?Wt. (Lbs)  212.08 209.2 210.04 209.2 210.3 206  ?BMI  33.22 kg/m2 32.77 kg/m2 32.9 kg/m2 32.77 kg/m2 32.94 kg/m2 32.26 kg/m2  ? ? ?  Latest Ref Rng & Units 12/27/2021  ?  9:40 AM 03/01/2020  ? 12:00 AM  ?Foot/eye exam completion dates  ?Eye Exam No Retinopathy  No Retinopathy       ?Foot Form Completion  Done   ?  ? This result is from an external source.  ? ? ? ? ? ?

## 2022-02-24 LAB — SPECIMEN STATUS REPORT

## 2022-02-24 LAB — LIPID PANEL W/O CHOL/HDL RATIO
Cholesterol, Total: 154 mg/dL (ref 100–199)
HDL: 41 mg/dL (ref >39)
LDL Chol Calc (NIH): 93 mg/dL (ref 0–99)
Triglycerides: 108 mg/dL (ref 0–149)
VLDL Cholesterol Cal: 20 mg/dL (ref 5–40)

## 2022-04-03 ENCOUNTER — Other Ambulatory Visit: Payer: Self-pay | Admitting: Nurse Practitioner

## 2022-04-19 ENCOUNTER — Telehealth: Payer: Self-pay | Admitting: Cardiology

## 2022-04-19 MED ORDER — METOPROLOL TARTRATE 100 MG PO TABS: 100.0000 mg | ORAL_TABLET | Freq: Two times a day (BID) | ORAL | 0 refills | Status: DC

## 2022-04-19 NOTE — Telephone Encounter (Signed)
Informed that metoprolol rx has been sent to requested pharmacy

## 2022-04-19 NOTE — Telephone Encounter (Signed)
*  STAT* If patient is at the pharmacy, call can be transferred to refill team.   1. Which medications need to be refilled? (please list name of each medication and dose if known) metoprolol tartrate (LOPRESSOR) 100 MG tablet  2. Which pharmacy/location (including street and city if local pharmacy) is medication to be sent to?   [facility] - [address], [place], [place] [address]  3. Do they need a 30 day or 90 day supply? 90  Pt is out of town and completely out of medication. She ask that prescription be sent to new pharmacy above. Pt made an appt with Dr. Diona Browner 06/20/22 @ 9:20am.

## 2022-05-04 ENCOUNTER — Ambulatory Visit: Payer: Medicare Other | Admitting: Nurse Practitioner

## 2022-05-14 DIAGNOSIS — E1122 Type 2 diabetes mellitus with diabetic chronic kidney disease: Secondary | ICD-10-CM | POA: Diagnosis not present

## 2022-05-14 DIAGNOSIS — E559 Vitamin D deficiency, unspecified: Secondary | ICD-10-CM | POA: Diagnosis not present

## 2022-05-14 DIAGNOSIS — N182 Chronic kidney disease, stage 2 (mild): Secondary | ICD-10-CM | POA: Diagnosis not present

## 2022-05-14 DIAGNOSIS — E039 Hypothyroidism, unspecified: Secondary | ICD-10-CM | POA: Diagnosis not present

## 2022-05-14 DIAGNOSIS — Z794 Long term (current) use of insulin: Secondary | ICD-10-CM | POA: Diagnosis not present

## 2022-05-15 DIAGNOSIS — E1142 Type 2 diabetes mellitus with diabetic polyneuropathy: Secondary | ICD-10-CM | POA: Diagnosis not present

## 2022-05-15 DIAGNOSIS — Z794 Long term (current) use of insulin: Secondary | ICD-10-CM | POA: Diagnosis not present

## 2022-05-15 DIAGNOSIS — Z7984 Long term (current) use of oral hypoglycemic drugs: Secondary | ICD-10-CM | POA: Diagnosis not present

## 2022-05-15 DIAGNOSIS — B351 Tinea unguium: Secondary | ICD-10-CM | POA: Diagnosis not present

## 2022-05-15 LAB — T4, FREE: Free T4: 0.79 ng/dL — ABNORMAL LOW (ref 0.82–1.77)

## 2022-05-15 LAB — TSH: TSH: 4.71 u[IU]/mL — ABNORMAL HIGH (ref 0.450–4.500)

## 2022-05-15 LAB — COMPREHENSIVE METABOLIC PANEL
ALT: 30 IU/L (ref 0–32)
AST: 25 IU/L (ref 0–40)
Albumin/Globulin Ratio: 1.5 (ref 1.2–2.2)
Albumin: 4.3 g/dL (ref 3.7–4.7)
Alkaline Phosphatase: 105 IU/L (ref 44–121)
BUN/Creatinine Ratio: 16 (ref 12–28)
BUN: 20 mg/dL (ref 8–27)
Bilirubin Total: 0.4 mg/dL (ref 0.0–1.2)
CO2: 22 mmol/L (ref 20–29)
Calcium: 10 mg/dL (ref 8.7–10.3)
Chloride: 107 mmol/L — ABNORMAL HIGH (ref 96–106)
Creatinine, Ser: 1.23 mg/dL — ABNORMAL HIGH (ref 0.57–1.00)
Globulin, Total: 2.8 g/dL (ref 1.5–4.5)
Glucose: 104 mg/dL — ABNORMAL HIGH (ref 70–99)
Potassium: 4.2 mmol/L (ref 3.5–5.2)
Sodium: 145 mmol/L — ABNORMAL HIGH (ref 134–144)
Total Protein: 7.1 g/dL (ref 6.0–8.5)
eGFR: 44 mL/min/{1.73_m2} — ABNORMAL LOW (ref >59)

## 2022-05-15 LAB — VITAMIN D 25 HYDROXY (VIT D DEFICIENCY, FRACTURES): Vit D, 25-Hydroxy: 47.3 ng/mL (ref 30.0–100.0)

## 2022-05-17 ENCOUNTER — Encounter: Payer: Self-pay | Admitting: Nurse Practitioner

## 2022-05-17 ENCOUNTER — Ambulatory Visit (INDEPENDENT_AMBULATORY_CARE_PROVIDER_SITE_OTHER): Payer: Medicare Other | Admitting: Nurse Practitioner

## 2022-05-17 VITALS — BP 137/68 | HR 56 | Ht 67.0 in | Wt 207.0 lb

## 2022-05-17 DIAGNOSIS — E1122 Type 2 diabetes mellitus with diabetic chronic kidney disease: Secondary | ICD-10-CM

## 2022-05-17 DIAGNOSIS — I10 Essential (primary) hypertension: Secondary | ICD-10-CM | POA: Diagnosis not present

## 2022-05-17 DIAGNOSIS — E559 Vitamin D deficiency, unspecified: Secondary | ICD-10-CM

## 2022-05-17 DIAGNOSIS — E782 Mixed hyperlipidemia: Secondary | ICD-10-CM

## 2022-05-17 DIAGNOSIS — N182 Chronic kidney disease, stage 2 (mild): Secondary | ICD-10-CM

## 2022-05-17 DIAGNOSIS — Z794 Long term (current) use of insulin: Secondary | ICD-10-CM

## 2022-05-17 DIAGNOSIS — E039 Hypothyroidism, unspecified: Secondary | ICD-10-CM

## 2022-05-17 LAB — POCT GLYCOSYLATED HEMOGLOBIN (HGB A1C): HbA1c POC (<> result, manual entry): 9.9 % (ref 4.0–5.6)

## 2022-05-17 MED ORDER — GLIPIZIDE ER 2.5 MG PO TB24: 2.5000 mg | ORAL_TABLET | Freq: Every day | ORAL | 1 refills | Status: DC

## 2022-05-17 MED ORDER — TRESIBA FLEXTOUCH 100 UNIT/ML ~~LOC~~ SOPN
38.0000 [IU] | PEN_INJECTOR | Freq: Every day | SUBCUTANEOUS | 3 refills | Status: DC
Start: 2022-05-17 — End: 2023-01-16

## 2022-05-17 MED ORDER — LEVOTHYROXINE SODIUM 50 MCG PO TABS: 50.0000 ug | ORAL_TABLET | Freq: Every day | ORAL | 1 refills | Status: DC

## 2022-05-17 NOTE — Progress Notes (Signed)
05/17/2022, 11:08 AM  Endocrinology follow-up note  Subjective:    Patient ID: Kimberly Freeman, female    DOB: 1941/08/02.  Kimberly Freeman is being seen in consultation for management of currently uncontrolled symptomatic diabetes requested by  Fayrene Helper, MD.   Past Medical History:  Diagnosis Date   Arthritis    CAD (coronary artery disease) 08/2019   60% RI with other 20-30% lesions at cath   Diabetes mellitus, type 2 ([place])    Essential hypertension    GERD (gastroesophageal reflux disease)    Hyperlipidemia    LVH (left ventricular hypertrophy)     Past Surgical History:  Procedure Laterality Date   ABDOMINAL HYSTERECTOMY     BIOPSY  07/18/2017   Procedure: BIOPSY;  Surgeon: Danie Binder, MD;  Location: [facility];  Service: Endoscopy;;  gastric   CHOLECYSTECTOMY  2004   COLONOSCOPY N/A 07/18/2017   Dr. Oneida Alar: Internal and external hemorrhoids.  No future screening/surveillance colonoscopies due to age.   ESOPHAGOGASTRODUODENOSCOPY N/A 07/18/2017   Dr. Oneida Alar: Small hiatal hernia, patchy mild inflammation characterized by congestion, erosions, erythema in the cardia, gastric body and antrum.  Diffuse moderate inflammation in the duodenal bulb and second portion duodenum.  Biopsies benign.  No H. pylori.   LEFT HEART CATH AND CORONARY ANGIOGRAPHY N/A 09/21/2019   Procedure: LEFT HEART CATH AND CORONARY ANGIOGRAPHY;  Surgeon: Troy Sine, MD;  Location: [facility];  Service: Cardiovascular;  Laterality: N/A;    Social History   Socioeconomic History   Marital status: Married    Spouse name: Winferd Humphrey    Number of children: 0   Years of education: 12+   Highest education level: 12th grade  Occupational History   Occupation: retired   Tobacco Use   Smoking status: Never   Smokeless tobacco: Never  Vaping Use   Vaping Use: Never used  Substance and Sexual Activity    Alcohol use: No    Alcohol/week: 0.0 standard drinks of alcohol   Drug use: No   Sexual activity: Not on file  Other Topics Concern   Not on file  Social History Narrative   Lives alone with husband    Social Determinants of Health   Financial Resource Strain: Low Risk  (09/30/2021)   Overall Financial Resource Strain (CARDIA)    Difficulty of Paying Living Expenses: Not hard at all  Food Insecurity: No Food Insecurity (09/30/2021)   Hunger Vital Sign    Worried About Running Out of Food in the Last Year: Never true    Ran Out of Food in the Last Year: Never true  Transportation Needs: No Transportation Needs (09/30/2021)   PRAPARE - Hydrologist (Medical): No    Lack of Transportation (Non-Medical): No  Physical Activity: Insufficiently Active (09/30/2021)   Exercise Vital Sign    Days of Exercise per Week: 3 days    Minutes of Exercise per Session: 30 min  Stress: No Stress Concern Present (09/30/2021)   [place]    Feeling of Stress : Not at all  Social Connections: [place] (  09/30/2021)   Social Connection and Isolation Panel [NHANES]    Frequency of Communication with Friends and Family: More than three times a week    Frequency of Social Gatherings with Friends and Family: More than three times a week    Attends Religious Services: More than 4 times per year    Active Member of [employer] or Organizations: Yes    Attends Engineer, structural: More than 4 times per year    Marital Status: Married    Family History  Problem Relation Age of Onset   Leukemia Mother    Cancer Mother    Diabetes Mother    Prostate cancer Father    Lung cancer Sister 11   Diabetes Sister    Colon cancer Neg Hx     Outpatient Encounter Medications as of 05/17/2022  Medication Sig   acetaminophen (TYLENOL) 500 MG tablet Take 500 mg by mouth daily as needed for mild pain or  moderate pain. For pain   amLODipine (NORVASC) 10 MG tablet Take 1 tablet (10 mg total) by mouth daily.   aspirin 81 MG chewable tablet Chew 1 tablet (81 mg total) by mouth daily.   atorvastatin (LIPITOR) 80 MG tablet TAKE 1 TABLET DAILY AT 6 P.M.   BD PEN NEEDLE MICRO U/F 32G X 6 MM MISC USE TO INJECT INSULIN DAILY   betamethasone dipropionate 0.05 % cream Apply topically 2 (two) times daily.   calcium-vitamin D (OSCAL WITH D) 500-200 MG-UNIT tablet Take 1 tablet by mouth.   Easy Touch Safety Lancets 28G MISC Twice daily testing dx E11.65   glipiZIDE (GLUCOTROL XL) 2.5 MG 24 hr tablet Take 1 tablet (2.5 mg total) by mouth daily with breakfast.   insulin degludec (TRESIBA FLEXTOUCH) 100 UNIT/ML FlexTouch Pen Inject 38 Units into the skin at bedtime.   irbesartan (AVAPRO) 300 MG tablet TAKE 1 TABLET DAILY   Lancets (ONETOUCH ULTRASOFT) lancets USE AS DIRECTED TWICE DAILY   levothyroxine (SYNTHROID) 50 MCG tablet Take 1 tablet (50 mcg total) by mouth daily before breakfast.   metoprolol tartrate (LOPRESSOR) 100 MG tablet Take 1 tablet (100 mg total) by mouth 2 (two) times daily.   Multiple Vitamin (MULTIVITAMIN WITH MINERALS) TABS tablet Take 1 tablet by mouth daily.   ONETOUCH ULTRA test strip USE AS INSTRUCTED FOR TWICE A DAY TESTING   polyethylene glycol (MIRALAX / GLYCOLAX) 17 g packet Take 17 g by mouth daily as needed.   spironolactone (ALDACTONE) 50 MG tablet TAKE 1 TABLET DAILY   [DISCONTINUED] glipiZIDE (GLUCOTROL XL) 2.5 MG 24 hr tablet Take 1 tablet (2.5 mg total) by mouth daily with breakfast.   [DISCONTINUED] levothyroxine (SYNTHROID) 25 MCG tablet TAKE 1 TABLET DAILY   [DISCONTINUED] TRESIBA FLEXTOUCH 100 UNIT/ML FlexTouch Pen INJECT 38 UNITS UNDER THE SKIN DAILY   No facility-administered encounter medications on file as of 05/17/2022.    ALLERGIES: Allergies  Allergen Reactions   Poison Oak Extract [Poison Oak Extract] Hives   Sulfonamide Derivatives Itching    Burning  sensation all over body   Levemir [Insulin Detemir] Rash    VACCINATION STATUS: Immunization History  Administered Date(s) Administered   Fluad Quad(high Dose 65+) 07/27/2019, 06/23/2020, 06/29/2021   Influenza Split 08/01/2011, 07/31/2012   Influenza Whole 07/29/2007, 07/20/2008, 07/21/2009, 07/19/2010   Influenza, High Dose Seasonal PF 07/23/2018   Influenza,inj,Quad PF,6+ Mos 08/07/2013, 06/15/2014, 07/19/2015, 06/28/2016, 07/25/2017   Moderna SARS-COV2 Booster Vaccination 07/26/2021   Moderna Sars-Covid-2 Vaccination 12/22/2019, 01/19/2020, 09/25/2020, 01/29/2021   Pneumococcal  Conjugate-13 10/12/2014   Pneumococcal Polysaccharide-23 03/16/2004, 08/29/2012   Td 03/16/2004   Tdap 03/31/2012   Zoster, Live 01/02/2007    Diabetes She presents for her follow-up diabetic visit. She has type 2 diabetes mellitus. Onset time: She was diagnosed at approximate age of 63 years. Her disease course has been improving. There are no hypoglycemic associated symptoms. Pertinent negatives for hypoglycemia include no confusion, headaches, pallor or seizures. Associated symptoms include fatigue. Pertinent negatives for diabetes include no chest pain, no polydipsia, no polyphagia and no polyuria. There are no hypoglycemic complications. Symptoms are stable. Diabetic complications include heart disease and nephropathy. Risk factors for coronary artery disease include diabetes mellitus, dyslipidemia, family history, hypertension, obesity, sedentary lifestyle and post-menopausal. Current diabetic treatment includes insulin injections and oral agent (monotherapy). She is compliant with treatment most of the time. Her weight is fluctuating minimally. She is following a generally healthy diet. When asked about meal planning, she reported none. She has had a previous visit with a dietitian. She participates in exercise intermittently. Her home blood glucose trend is decreasing steadily. Her breakfast blood glucose  range is generally 110-130 mg/dl. Her bedtime blood glucose range is generally 130-140 mg/dl. (She presents today with her meter and logs showing at target glycemic profile overall.  Her POCT A1c today is 9.9%, improving from last visit of 10.2%.  She notes she did go on vacation and splurge with her diet, but is getting back on track.  Analysis of her meter shows 7-day average of 116 (with 7 readings), 14-day average of 118 (with 10 readings), and 30-day average of 130 (with 24 readings).  She says she has another meter she uses at home as well.) An ACE inhibitor/angiotensin II receptor blocker is not being taken. She sees a podiatrist.Eye exam is current.  Hyperlipidemia This is a chronic problem. The current episode started more than 1 year ago. The problem is controlled. Recent lipid tests were reviewed and are normal. Exacerbating diseases include chronic renal disease, diabetes and obesity. Factors aggravating her hyperlipidemia include beta blockers. Pertinent negatives include no chest pain, myalgias or shortness of breath. Current antihyperlipidemic treatment includes statins. The current treatment provides mild improvement of lipids. There are no compliance problems.  Risk factors for coronary artery disease include diabetes mellitus, dyslipidemia, obesity, hypertension, a sedentary lifestyle and post-menopausal.  Hypertension This is a chronic problem. The current episode started more than 1 year ago. The problem has been resolved since onset. The problem is controlled. Pertinent negatives include no chest pain, headaches, palpitations or shortness of breath. There are no associated agents to hypertension. Risk factors for coronary artery disease include sedentary lifestyle, obesity, post-menopausal state, diabetes mellitus and dyslipidemia. Past treatments include calcium channel blockers, beta blockers and diuretics. The current treatment provides mild improvement. There are no compliance problems.   Hypertensive end-organ damage includes kidney disease and CAD/MI. Identifiable causes of hypertension include chronic renal disease.    Review of systems  Constitutional: + Minimally fluctuating body weight,  current Body mass index is 32.42 kg/m. , no fatigue, no subjective hyperthermia, no subjective hypothermia Eyes: no blurry vision, no xerophthalmia ENT: no sore throat, no nodules palpated in throat, no dysphagia/odynophagia, no hoarseness Cardiovascular: no chest pain, no shortness of breath, no palpitations, no leg swelling Respiratory: no cough, no shortness of breath Gastrointestinal: no nausea/vomiting/diarrhea Musculoskeletal: no muscle/joint aches Skin: no rashes, no hyperemia Neurological: no tremors, no numbness, no tingling, no dizziness Psychiatric: no depression, no anxiety   Objective:  BP 137/68   Pulse (!) 56   Ht 5\' 7"  (1.702 m)   Wt 207 lb (93.9 kg)   BMI 32.42 kg/m   Wt Readings from Last 3 Encounters:  05/17/22 207 lb (93.9 kg)  02/22/22 212 lb 1.3 oz (96.2 kg)  01/02/22 209 lb 3.2 oz (94.9 kg)    BP Readings from Last 3 Encounters:  05/17/22 137/68  02/22/22 138/70  01/02/22 127/67      Physical Exam- Limited  Constitutional:  Body mass index is 32.42 kg/m. , not in acute distress, normal state of mind Eyes:  EOMI, no exophthalmos Neck: Supple Cardiovascular: RRR, no murmurs, rubs, or gallops, no edema Respiratory: Adequate breathing efforts, no crackles, rales, rhonchi, or wheezing Musculoskeletal: no gross deformities, strength intact in all four extremities, no gross restriction of joint movements Skin:  no rashes, no hyperemia Neurological: no tremor with outstretched hands    CMP     Component Value Date/Time   NA 145 (H) 05/14/2022 0911   K 4.2 05/14/2022 0911   CL 107 (H) 05/14/2022 0911   CO2 22 05/14/2022 0911   GLUCOSE 104 (H) 05/14/2022 0911   GLUCOSE 346 (H) 02/05/2022 1111   BUN 20 05/14/2022 0911   CREATININE 1.23  (H) 05/14/2022 0911   CREATININE 1.14 (H) 12/15/2019 1048   CALCIUM 10.0 05/14/2022 0911   PROT 7.1 05/14/2022 0911   ALBUMIN 4.3 05/14/2022 0911   AST 25 05/14/2022 0911   ALT 30 05/14/2022 0911   ALKPHOS 105 05/14/2022 0911   BILITOT 0.4 05/14/2022 0911   GFRNONAA 48 (L) 02/05/2022 1111   GFRNONAA 45 (L) 12/15/2019 1048   GFRAA 48 (L) 09/05/2020 0810   GFRAA 53 (L) 12/15/2019 1048     Diabetic Labs (most recent): Lab Results  Component Value Date   HGBA1C 9.9 05/17/2022   HGBA1C 10.2 (A) 01/02/2022   HGBA1C 7.6 (A) 05/04/2021   MICROALBUR 1.5 07/24/2019   MICROALBUR 0.7 04/02/2018   MICROALBUR 8.8 (H) 11/27/2017     Lipid Panel ( most recent) Lipid Panel     Component Value Date/Time   CHOL 154 02/19/2022 1038   TRIG 108 02/19/2022 1038   HDL 41 02/19/2022 1038   CHOLHDL 3.3 06/30/2021 0837   CHOLHDL 3.3 12/15/2019 1048   VLDL 21 03/22/2017 0803   LDLCALC 93 02/19/2022 1038   LDLCALC 80 12/15/2019 1048   LABVLDL 20 02/19/2022 1038      Lab Results  Component Value Date   TSH 4.710 (H) 05/14/2022   TSH 4.290 12/25/2021   TSH 8.400 (H) 08/29/2021   TSH 7.640 (H) 04/28/2021   TSH 4.360 05/23/2020   TSH 6.85 (H) 07/24/2019   TSH 5.19 (H) 08/04/2018   TSH 3.81 04/10/2016   TSH 2.410 12/13/2015   TSH 5.289 (H) 11/14/2015   FREET4 0.79 (L) 05/14/2022   FREET4 0.83 12/25/2021   FREET4 0.75 (L) 08/29/2021   FREET4 0.75 (L) 04/28/2021   FREET4 1.0 12/15/2019   FREET4 0.8 07/24/2019   FREET4 0.9 08/04/2018   FREET4 0.78 12/13/2015   FREET4 0.84 11/14/2015       Assessment & Plan:   1) Type 2 diabetes mellitus with stage 2 chronic kidney disease, with long-term current use of insulin (HCC)  - TASANEE KENASTON has currently uncontrolled symptomatic type 2 DM since  81 years of age. Recent labs reviewed.  She presents today with her meter and logs showing at target glycemic profile overall.  Her POCT A1c today  is 9.9%, improving from last visit of 10.2%.   She notes she did go on vacation and splurge with her diet, but is getting back on track.  Analysis of her meter shows 7-day average of 116 (with 7 readings), 14-day average of 118 (with 10 readings), and 30-day average of 130 (with 24 readings).  She says she has another meter she uses at home as well.  - I had a long discussion with her about the progressive nature of diabetes and the pathology behind its complications. -her diabetes is complicated by stage 2 CKD, obesity /sedentary life, and she remains at a high risk for more acute and chronic complications which include CAD, CVA, CKD, retinopathy, and neuropathy. These are all discussed in detail with her.  - Nutritional counseling repeated at each appointment due to patients tendency to fall back in to old habits.  - The patient admits there is a room for improvement in their diet and drink choices. -  Suggestion is made for the patient to avoid simple carbohydrates from their diet including Cakes, Sweet Desserts / Pastries, Ice Cream, Soda (diet and regular), Sweet Tea, Candies, Chips, Cookies, Sweet Pastries, Store Bought Juices, Alcohol in Excess of 1-2 drinks a day, Artificial Sweeteners, Coffee Creamer, and "Sugar-free" Products. This will help patient to have stable blood glucose profile and potentially avoid unintended weight gain.   - I encouraged the patient to switch to unprocessed or minimally processed complex starch and increased protein intake (animal or plant source), fruits, and vegetables.   - Patient is advised to stick to a routine mealtimes to eat 3 meals a day and avoid unnecessary snacks (to snack only to correct hypoglycemia).  - I have approached her with the following individualized plan to manage  her diabetes and patient agrees:   - she will continue to need at least basal insulin in order for her to achieve control of diabetes to target.    -Based on her stable glycemic profile, she is advised to continue her  Tresiba 38 units sQ nightly and Glipizide 2.5 mg XL daily with breakfast.    -She is encouraged to continue monitoring glucose twice daily, before breakfast and before bed, and to call the clinic if she has readings less than 70 or greater than 300 for 3 tests in a row.  -She will be considered for incretin therapy during her next visit.  - Specific targets for  A1c;  LDL, HDL,  and Triglycerides were discussed with the patient.  2) Blood Pressure /Hypertension:  Her blood pressure is controlled to target for her age.  She is advised to continue Norvasc 10 mg po daily, Irbesartan 300 mg po daily, Metoprolol 100 mg po twice daily, and Aldactone 50 mg po daily.  3) Lipids/Hyperlipidemia:    Her most recent lipid panel from 02/19/22 shows controlled LDL at 93.  She is advised to continue Atorvastatin 80 mg po daily at bedtime.  Side effects and precautions discussed with her.  4)  Weight/Diet:  Her Body mass index is 32.42 kg/m.  -   clearly complicating her diabetes care.   she is  a candidate for weight loss. I discussed with her the fact that loss of 5 - 10% of her  current body weight will have the most impact on her diabetes management.  Exercise, and detailed carbohydrates information provided  -  detailed on discharge instructions.  5) Hypothyroidism- unspecified Her previsit thyroid function tests are consistent with slight under-replacement.  She  is advised to increase her Levothyroxine to 50 mcg po daily before breakfast.    - The correct intake of thyroid hormone (Levothyroxine, Synthroid), is on empty stomach first thing in the morning, with water, separated by at least 30 minutes from breakfast and other medications,  and separated by more than 4 hours from calcium, iron, multivitamins, acid reflux medications (PPIs).  - This medication is a life-long medication and will be needed to correct thyroid hormone imbalances for the rest of your life.  The dose may change from time to time,  based on thyroid blood work.  - It is extremely important to be consistent taking this medication, near the same time each morning.  -AVOID TAKING PRODUCTS CONTAINING BIOTIN (commonly found in Hair, Skin, Nails vitamins) AS IT INTERFERES WITH THE VALIDITY OF THYROID FUNCTION BLOOD TESTS.  6) Chronic Care/Health Maintenance: -she is on Statin medications and  is encouraged to initiate and continue to follow up with Ophthalmology, Dentist,  Podiatrist at least yearly or according to recommendations, and advised to stay away from smoking. I have recommended yearly flu vaccine and pneumonia vaccine at least every 5 years; moderate intensity exercise for up to 150 minutes weekly; and  sleep for at least 7 hours a day.  - she is advised to maintain close follow up with Fayrene Helper, MD for primary care needs, as well as her other providers for optimal and coordinated care.      I spent 40 minutes in the care of the patient today including review of labs from [facility], Lipids, Thyroid Function, Hematology (current and previous including abstractions from other facilities); face-to-face time discussing  her blood glucose readings/logs, discussing hypoglycemia and hyperglycemia episodes and symptoms, medications doses, her options of short and long term treatment based on the latest standards of care / guidelines;  discussion about incorporating lifestyle medicine;  and documenting the encounter. Risk reduction counseling performed per USPSTF guidelines to reduce obesity and cardiovascular risk factors.     Please refer to Patient Instructions for Blood Glucose Monitoring and Insulin/Medications Dosing Guide"  in media tab for additional information. Please  also refer to " Patient Self Inventory" in the Media  tab for reviewed elements of pertinent patient history.  [name] participated in the discussions, expressed understanding, and voiced agreement with the above plans.  All questions were  answered to her satisfaction. she is encouraged to contact clinic should she have any questions or concerns prior to her return visit.   Follow up plan: - Return in about 4 months (around 09/17/2022) for Diabetes F/U with A1c in office, Thyroid follow up, Previsit labs, Bring meter and logs.  Rayetta Pigg, [facility] [address] [place], [place] [address] Phone: [phone] Fax: [phone]  05/17/2022, 11:08 AM

## 2022-05-17 NOTE — Patient Instructions (Signed)

## 2022-06-06 ENCOUNTER — Ambulatory Visit (INDEPENDENT_AMBULATORY_CARE_PROVIDER_SITE_OTHER): Payer: Medicare Other | Admitting: Orthopaedic Surgery

## 2022-06-06 ENCOUNTER — Encounter: Payer: Self-pay | Admitting: Orthopaedic Surgery

## 2022-06-06 DIAGNOSIS — G8929 Other chronic pain: Secondary | ICD-10-CM

## 2022-06-06 DIAGNOSIS — M25561 Pain in right knee: Secondary | ICD-10-CM

## 2022-06-06 MED ORDER — METHYLPREDNISOLONE ACETATE 40 MG/ML IJ SUSP
40.0000 mg | Freq: Once | INTRAMUSCULAR | Status: AC
  Administered 2022-06-06: 40 mg via INTRA_ARTICULAR

## 2022-06-06 NOTE — Progress Notes (Signed)
PROCEDURE NOTE:  The patient requests injections of the right knee , verbal consent was obtained.  The right knee was prepped appropriately after time out was performed.   Sterile technique was observed and injection of 1 cc of DepoMedrol 40mg  with several cc's of plain xylocaine. Anesthesia was provided by ethyl chloride and a 20-gauge needle was used to inject the knee area. The injection was tolerated well.  A band aid dressing was applied.  The patient was advised to apply ice later today and tomorrow to the injection sight as needed.  Encounter Diagnosis  Name Primary?   Chronic pain of right knee Yes   Return prn.  Call if any problem.  Precautions discussed.  Electronically Signed , MD 8/16/20239:59 AM

## 2022-06-06 NOTE — Addendum Note (Signed)
Addended by: Recardo Evangelist A on: 06/06/2022 02:23 PM   Modules accepted: Orders

## 2022-06-18 ENCOUNTER — Other Ambulatory Visit: Payer: Self-pay | Admitting: Family Medicine

## 2022-06-19 NOTE — Progress Notes (Unsigned)
Cardiology Office Note  Date: 06/20/2022   ID: Kimberly, Freeman 02-Aug-1941, MRN [id number]  PCP:  [name], MD  Cardiologist:  Nona Dell, MD Electrophysiologist:  None   Chief Complaint  Patient presents with   Cardiac follow-up    History of Present Illness: Kimberly Freeman is an 81 y.o. female last seen in April 2022.  She is here for a routine visit.  Reports no angina symptoms on current medications.  She has an exercise bicycle that she uses occasionally at home, does have limitations related to knee arthritis however.  She continues to follow regularly with Dr. Lodema Hong.  I reviewed her medications which are outlined below.  She reports no intolerances.  I personally reviewed her ECG today which shows sinus rhythm with right bundle branch block and diffuse repolarization abnormalities.  Last echocardiogram was in November 2020 as detailed below.  She had evidence of mild aortic stenosis at that time.  We discussed getting an updated study.  I rechecked her blood pressure today at 138/62.  Past Medical History:  Diagnosis Date   Arthritis    CAD (coronary artery disease) 08/2019   60% RI with other 20-30% lesions at cath   Diabetes mellitus, type 2 (HCC)    Essential hypertension    GERD (gastroesophageal reflux disease)    Hyperlipidemia    LVH (left ventricular hypertrophy)     Past Surgical History:  Procedure Laterality Date   ABDOMINAL HYSTERECTOMY     BIOPSY  07/18/2017   Procedure: BIOPSY;  Surgeon: [name], MD;  Location: [facility];  Service: Endoscopy;;  gastric   CHOLECYSTECTOMY  2004   COLONOSCOPY N/A 07/18/2017   Dr. [name]: Internal and external hemorrhoids.  No future screening/surveillance colonoscopies due to age.   ESOPHAGOGASTRODUODENOSCOPY N/A 07/18/2017   Dr. [name]: Small hiatal hernia, patchy mild inflammation characterized by congestion, erosions, erythema in the cardia, gastric body and antrum.  Diffuse moderate  inflammation in the duodenal bulb and second portion duodenum.  Biopsies benign.  No H. pylori.   LEFT HEART CATH AND CORONARY ANGIOGRAPHY N/A 09/21/2019   Procedure: LEFT HEART CATH AND CORONARY ANGIOGRAPHY;  Surgeon: [name], MD;  Location: [facility];  Service: Cardiovascular;  Laterality: N/A;    Current Outpatient Medications  Medication Sig Dispense Refill   acetaminophen (TYLENOL) 500 MG tablet Take 500 mg by mouth daily as needed for mild pain or moderate pain. For pain     amLODipine (NORVASC) 10 MG tablet Take 1 tablet (10 mg total) by mouth daily. 90 tablet 3   aspirin 81 MG chewable tablet Chew 1 tablet (81 mg total) by mouth daily. 30 tablet 0   atorvastatin (LIPITOR) 80 MG tablet TAKE 1 TABLET DAILY AT 6 P.M. 90 tablet 3   BD PEN NEEDLE MICRO U/F 32G X 6 MM MISC USE TO INJECT INSULIN DAILY 100 each 3   betamethasone dipropionate 0.05 % cream Apply topically 2 (two) times daily. 45 g 1   calcium-vitamin D (OSCAL WITH D) 500-200 MG-UNIT tablet Take 1 tablet by mouth.     Easy Touch Lancets 28G MISC USE TO TEST TWICE A DAY 300 each 3   glipiZIDE (GLUCOTROL XL) 2.5 MG 24 hr tablet Take 1 tablet (2.5 mg total) by mouth daily with breakfast. 90 tablet 1   insulin degludec (TRESIBA FLEXTOUCH) 100 UNIT/ML FlexTouch Pen Inject 38 Units into the skin at bedtime. 30 mL 3   irbesartan (AVAPRO)  300 MG tablet TAKE 1 TABLET DAILY 90 tablet 3   Lancets (ONETOUCH ULTRASOFT) lancets USE AS DIRECTED TWICE DAILY 200 each 3   levothyroxine (SYNTHROID) 50 MCG tablet Take 1 tablet (50 mcg total) by mouth daily before breakfast. 90 tablet 1   metoprolol tartrate (LOPRESSOR) 100 MG tablet Take 1 tablet (100 mg total) by mouth 2 (two) times daily. 180 tablet 0   Multiple Vitamin (MULTIVITAMIN WITH MINERALS) TABS tablet Take 1 tablet by mouth daily.     ONETOUCH ULTRA test strip USE AS INSTRUCTED FOR TWICE A DAY TESTING 200 strip 3   polyethylene glycol (MIRALAX / GLYCOLAX) 17 g packet Take  17 g by mouth daily as needed.     spironolactone (ALDACTONE) 50 MG tablet TAKE 1 TABLET DAILY 90 tablet 3   No current facility-administered medications for this visit.   Allergies:  Poison oak extract [poison oak extract], Sulfonamide derivatives, and Levemir [insulin detemir]   ROS: No palpitations or syncope.  Physical Exam: VS:  BP 138/62   Pulse 61   Ht 5\' 7"  (1.702 m)   Wt 206 lb 3.2 oz (93.5 kg)   SpO2 96%   BMI 32.30 kg/m , BMI Body mass index is 32.3 kg/m.  Wt Readings from Last 3 Encounters:  06/20/22 206 lb 3.2 oz (93.5 kg)  05/17/22 207 lb (93.9 kg)  02/22/22 212 lb 1.3 oz (96.2 kg)    General: Patient appears comfortable at rest. HEENT: Conjunctiva and lids normal. Neck: Supple, no elevated JVP or carotid bruits, no thyromegaly. Lungs: Clear to auscultation, nonlabored breathing at rest. Cardiac: Regular rate and rhythm, no S3, 2/6 systolic murmur, no pericardial rub. Extremities: No pitting edema.  ECG:  An ECG dated 09/06/2020 was personally reviewed today and demonstrated:  Sinus rhythm with right bundle branch block and repolarization abnormalities.  Recent Labwork: 02/19/2022: Hemoglobin 11.7; Platelets 213 05/14/2022: ALT 30; AST 25; BUN 20; Creatinine, Ser 1.23; Potassium 4.2; Sodium 145; TSH 4.710     Component Value Date/Time   CHOL 154 02/19/2022 1038   TRIG 108 02/19/2022 1038   HDL 41 02/19/2022 1038   CHOLHDL 3.3 06/30/2021 0837   CHOLHDL 3.3 12/15/2019 1048   VLDL 21 03/22/2017 0803   LDLCALC 93 02/19/2022 1038   LDLCALC 80 12/15/2019 1048    Other Studies Reviewed Today:  Cardiac catheterization 09/21/2019: Lat Ramus lesion is 60% stenosed. Mid LAD lesion is 30% stenosed. Prox RCA lesion is 20% stenosed. Mid RCA lesion is 20% stenosed.   Mild to moderate CAD with 30% smooth narrowing in the mid LAD; 50 to 60% proximal stenosis in inferior branch of a ramus intermediate vessel; and mild 20% proximal and mid RCA stenoses and a dominant  RCA.   LV EDP 21 mm.  The echo Doppler study done earlier today showed vigorous LV contractility with an EF at 70 to 75% with severe LVH.   Echocardiogram 09/21/2019:  1. Left ventricular ejection fraction, by visual estimation, is 70 to  75%. The left ventricle has hyperdynamic function. There is severely  increased left ventricular hypertrophy.   2. Left ventricular diastolic parameters are consistent with Grade I  diastolic dysfunction (impaired relaxation).   3. Global right ventricle has normal systolic function.The right  ventricular size is normal. Mildly increased right ventricular wall  thickness.   4. Left atrial size was mild-moderately dilated.   5. Right atrial size was normal.   6. Mild aortic valve annular calcification.   7. Mild mitral annular  calcification.   8. The mitral valve is grossly normal. Trace mitral valve regurgitation.   9. The tricuspid valve is grossly normal. Tricuspid valve regurgitation  is mild.  10. The aortic valve is tricuspid. Aortic valve regurgitation is trivial.  Moderately sclerotic to mildly stenotic aortic valve.  11. The pulmonic valve was not well visualized. Pulmonic valve  regurgitation is trivial.  12. TR signal is inadequate for assessing pulmonary artery systolic  pressure.  13. The inferior vena cava is dilated in size with >50% respiratory  variability, suggesting right atrial pressure of 8 mmHg.   Assessment and Plan:  1.  CAD, mild to moderate disease by cardiac catheterization in November 2020.  No active angina symptoms at this time on medical therapy.  ECG reviewed.  Continue aspirin, Norvasc, Lipitor, Avapro, and Lopressor.  2.  Cardiac murmur with previously documented severe LVH and also mild aortic stenosis.  Last echocardiogram was in November 2020.  Study will be updated.  Murmur somewhat less prominent on today's examination.  3.  Essential hypertension, systolic is in the 130s today.  No changes to current regimen  as noted above.  Medication Adjustments/Labs and Tests Ordered: Current medicines are reviewed at length with the patient today.  Concerns regarding medicines are outlined above.   Tests Ordered: Orders Placed This Encounter  Procedures   EKG 12-Lead   ECHOCARDIOGRAM COMPLETE    Medication Changes: No orders of the defined types were placed in this encounter.   Disposition:  Follow up  1 year.  Signed, Jonelle Sidle, MD, [facility] 06/20/2022 9:29 AM    [facility] at [facility] [address], [place], [place] [address] Phone: [phone]; Fax: [phone]

## 2022-06-20 ENCOUNTER — Encounter: Payer: Self-pay | Admitting: Cardiology

## 2022-06-20 ENCOUNTER — Ambulatory Visit: Payer: Medicare Other | Attending: Cardiology | Admitting: Cardiology

## 2022-06-20 VITALS — BP 138/62 | HR 61 | Ht 67.0 in | Wt 206.2 lb

## 2022-06-20 DIAGNOSIS — I10 Essential (primary) hypertension: Secondary | ICD-10-CM | POA: Diagnosis not present

## 2022-06-20 DIAGNOSIS — I25119 Atherosclerotic heart disease of native coronary artery with unspecified angina pectoris: Secondary | ICD-10-CM | POA: Diagnosis not present

## 2022-06-20 DIAGNOSIS — I350 Nonrheumatic aortic (valve) stenosis: Secondary | ICD-10-CM | POA: Insufficient documentation

## 2022-06-20 NOTE — Patient Instructions (Signed)
Medication Instructions:  ?Your physician recommends that you continue on your current medications as directed. Please refer to the Current Medication list given to you today. ? ? ?Labwork: ?None today ? ?Testing/Procedures: ?Your physician has requested that you have an echocardiogram. Echocardiography is a painless test that uses sound waves to create images of your heart. It provides your doctor with information about the size and shape of your heart and how well your heart?s chambers and valves are working. This procedure takes approximately one hour. There are no restrictions for this procedure. ? ? ?Follow-Up: ?1 year ? ?Any Other Special Instructions Will Be Listed Below (If Applicable). ? ?If you need a refill on your cardiac medications before your next appointment, please call your pharmacy. ? ?

## 2022-06-26 ENCOUNTER — Ambulatory Visit (HOSPITAL_COMMUNITY)
Admission: RE | Admit: 2022-06-26 | Discharge: 2022-06-26 | Disposition: A | Discharge status: Home / Self Care | Payer: Medicare Other | Source: Ambulatory Visit | Attending: Cardiology | Admitting: Cardiology

## 2022-06-26 DIAGNOSIS — I350 Nonrheumatic aortic (valve) stenosis: Secondary | ICD-10-CM | POA: Diagnosis not present

## 2022-06-26 LAB — ECHOCARDIOGRAM COMPLETE
AR max vel: 1.46 cm2
AV Area VTI: 1.6 cm2
AV Area mean vel: 1.44 cm2
AV Mean grad: 12 mmHg
AV Peak grad: 25.6 mmHg
Ao pk vel: 2.53 m/s
Area-P 1/2: 3.28 cm2
MV VTI: 2.69 cm2
S' Lateral: 2.2 cm

## 2022-06-26 NOTE — Progress Notes (Signed)
*  PRELIMINARY RESULTS* Echocardiogram 2D Echocardiogram has been performed.  Carolyne Fiscal 06/26/2022, 11:37 AM

## 2022-07-26 ENCOUNTER — Other Ambulatory Visit (HOSPITAL_COMMUNITY): Payer: Self-pay | Admitting: Family Medicine

## 2022-07-26 DIAGNOSIS — Z1231 Encounter for screening mammogram for malignant neoplasm of breast: Secondary | ICD-10-CM

## 2022-07-26 DIAGNOSIS — B351 Tinea unguium: Secondary | ICD-10-CM | POA: Diagnosis not present

## 2022-07-26 DIAGNOSIS — E1142 Type 2 diabetes mellitus with diabetic polyneuropathy: Secondary | ICD-10-CM | POA: Diagnosis not present

## 2022-07-30 ENCOUNTER — Other Ambulatory Visit: Payer: Self-pay | Admitting: Family Medicine

## 2022-08-01 ENCOUNTER — Encounter: Payer: Self-pay | Admitting: Family Medicine

## 2022-08-01 ENCOUNTER — Ambulatory Visit: Payer: Medicare Other | Admitting: Family Medicine

## 2022-08-01 VITALS — BP 120/72 | HR 95 | Ht 67.0 in | Wt 209.0 lb

## 2022-08-01 DIAGNOSIS — Z23 Encounter for immunization: Secondary | ICD-10-CM

## 2022-08-01 DIAGNOSIS — E785 Hyperlipidemia, unspecified: Secondary | ICD-10-CM | POA: Diagnosis not present

## 2022-08-01 DIAGNOSIS — E669 Obesity, unspecified: Secondary | ICD-10-CM

## 2022-08-01 DIAGNOSIS — E1165 Type 2 diabetes mellitus with hyperglycemia: Secondary | ICD-10-CM

## 2022-08-01 DIAGNOSIS — E039 Hypothyroidism, unspecified: Secondary | ICD-10-CM | POA: Diagnosis not present

## 2022-08-01 DIAGNOSIS — I25119 Atherosclerotic heart disease of native coronary artery with unspecified angina pectoris: Secondary | ICD-10-CM | POA: Diagnosis not present

## 2022-08-01 DIAGNOSIS — Z794 Long term (current) use of insulin: Secondary | ICD-10-CM

## 2022-08-01 DIAGNOSIS — E559 Vitamin D deficiency, unspecified: Secondary | ICD-10-CM

## 2022-08-01 DIAGNOSIS — I10 Essential (primary) hypertension: Secondary | ICD-10-CM | POA: Diagnosis not present

## 2022-08-01 NOTE — Patient Instructions (Addendum)
F/U in 6 months, call if you need me sooner  Please get Covid, shingrix, RSV, and TdAP vaccines at your pharmacy  Fasting lipid, cmp and EGFr, Vit D in next 1 week  It is important that you exercise regularly at least 30 minutes 5 times a week. If you develop chest pain, have severe difficulty breathing, or feel very tired, stop exercising immediately and seek medical attention   Hoping blood sugar improves  Thanks for choosing [facility], we consider it a privelige to serve you.

## 2022-08-02 ENCOUNTER — Encounter: Payer: Self-pay | Admitting: Family Medicine

## 2022-08-02 DIAGNOSIS — E039 Hypothyroidism, unspecified: Secondary | ICD-10-CM | POA: Insufficient documentation

## 2022-08-02 DIAGNOSIS — I10 Essential (primary) hypertension: Secondary | ICD-10-CM | POA: Insufficient documentation

## 2022-08-02 NOTE — Assessment & Plan Note (Signed)
  Patient re-educated about  the importance of commitment to a  minimum of 150 minutes of exercise per week as able.  The importance of healthy food choices with portion control discussed, as well as eating regularly and within a 12 hour window most days. The need to choose "clean , green" food 50 to 75% of the time is discussed, as well as to make water the primary drink and set a goal of 64 ounces water daily.       08/01/2022   10:02 AM 06/20/2022    9:11 AM 05/17/2022    9:43 AM  Weight /BMI  Weight 209 lb 206 lb 3.2 oz 207 lb  Height 5\' 7"  (1.702 m) 5\' 7"  (1.702 m) 5\' 7"  (1.702 m)  BMI 32.73 kg/m2 32.3 kg/m2 32.42 kg/m2

## 2022-08-02 NOTE — Progress Notes (Signed)
Kimberly Freeman     MRN: [id number]      DOB: 1940-11-13   HPI Kimberly Freeman is here for follow up and re-evaluation of chronic medical conditions, medication management and review of any available recent lab and radiology data.  Preventive health is updated, specifically  Cancer screening and Immunization.   Questions or concerns regarding consultations or procedures which the PT has had in the interim are  addressed.still having uncontrolled diabetes, but is working on this The PT denies any adverse reactions to current medications since the last visit.  There are no new concerns. Denies polyuria, polydipsia, blurred vision , or hypoglycemic episodes. There are no specific complaints   ROS Denies recent fever or chills. Denies sinus pressure, nasal congestion, ear pain or sore throat. Denies chest congestion, productive cough or wheezing. Denies chest pains, palpitations and leg swelling Denies abdominal pain, nausea, vomiting,diarrhea or constipation.   Denies dysuria, frequency, hesitancy or incontinence. Denies uncontrolled joint pain, swelling and limitation in mobility. Denies headaches, seizures, numbness, or tingling. Denies depression, has some  anxiety regarding spouse's health denies  insomnia. Denies skin break down or rash.   PE  BP 120/72   Pulse 95   Ht 5\' 7"  (1.702 m)   Wt 209 lb (94.8 kg)   SpO2 94%   BMI 32.73 kg/m   Patient alert and oriented and in no cardiopulmonary distress.  HEENT: No facial asymmetry, EOMI,     Neck supple .  Chest: Clear to auscultation bilaterally.  CVS: S1, S2 no murmurs, no S3.Regular rate.  ABD: Soft non tender.   Ext: No edema  MS: Adequate though reduced  ROM spine, shoulders, hips and knees.  Skin: Intact, no ulcerations or rash noted.  Psych: Good eye contact, normal affect. Memory intact not anxious or depressed appearing.  CNS: CN 2-12 intact, power,  normal throughout.no focal deficits noted.   Assessment &  Plan  Essential hypertension Controlled, no change in medication DASH diet and commitment to daily physical activity for a minimum of 30 minutes discussed and encouraged, as a part of hypertension management. The importance of attaining a healthy weight is also discussed.     08/01/2022   10:27 AM 08/01/2022   10:02 AM 06/20/2022    9:25 AM 06/20/2022    9:11 AM 05/17/2022    9:43 AM 02/22/2022   10:57 AM 02/22/2022   10:25 AM  BP/Weight  Systolic BP 120 132 138 142 137 138 149  Diastolic BP 72 72 62 62 68 70 73  Wt. (Lbs)  209  206.2 207  212.08  BMI  32.73 kg/m2  32.3 kg/m2 32.42 kg/m2  33.22 kg/m2       Dyslipidemia, goal LDL below 70 Hyperlipidemia:Low fat diet discussed and encouraged.   Lipid Panel  Lab Results  Component Value Date   CHOL 154 02/19/2022   HDL 41 02/19/2022   LDLCALC 93 02/19/2022   TRIG 108 02/19/2022   CHOLHDL 3.3 06/30/2021     Controlled, no change in medication Updated lab needed at/ before next visit.   Obesity (BMI 30-39.9)  Patient re-educated about  the importance of commitment to a  minimum of 150 minutes of exercise per week as able.  The importance of healthy food choices with portion control discussed, as well as eating regularly and within a 12 hour window most days. The need to choose "clean , green" food 50 to 75% of the time is discussed, as well as to make  water the primary drink and set a goal of 64 ounces water daily.       08/01/2022   10:02 AM 06/20/2022    9:11 AM 05/17/2022    9:43 AM  Weight /BMI  Weight 209 lb 206 lb 3.2 oz 207 lb  Height 5\' 7"  (1.702 m) 5\' 7"  (1.702 m) 5\' 7"  (1.702 m)  BMI 32.73 kg/m2 32.3 kg/m2 32.42 kg/m2      Uncontrolled type 2 diabetes mellitus with hyperglycemia, with long-term current use of insulin ([place]) Managed by endo, uncontrolled but working on this Kimberly Freeman is reminded of the importance of commitment to daily physical activity for 30 minutes or more, as able and the need to  limit carbohydrate intake to 30 to 60 grams per meal to help with blood sugar control.   The need to take medication as prescribed, test blood sugar as directed, and to call between visits if there is a concern that blood sugar is uncontrolled is also discussed.   Kimberly Freeman is reminded of the importance of daily foot exam, annual eye examination, and good blood sugar, blood pressure and cholesterol control.     Latest Ref Rng & Units 05/17/2022    9:51 AM 05/14/2022    9:11 AM 02/19/2022   10:38 AM 02/05/2022   11:11 AM 01/02/2022    9:47 AM  Diabetic Labs  HbA1c 4.0 - 5.6 % 9.9     10.2   Chol 100 - 199 mg/dL   154     HDL >39 mg/dL   41     Calc LDL 0 - 99 mg/dL   93     Triglycerides 0 - 149 mg/dL   108     Creatinine 0.57 - 1.00 mg/dL  1.23  1.15  1.15        08/01/2022   10:27 AM 08/01/2022   10:02 AM 06/20/2022    9:25 AM 06/20/2022    9:11 AM 05/17/2022    9:43 AM 02/22/2022   10:57 AM 02/22/2022   10:25 AM  BP/Weight  Systolic BP 829 937 169 678 938 101 751  Diastolic BP 72 72 62 62 68 70 73  Wt. (Lbs)  209  206.2 207  212.08  BMI  32.73 kg/m2  32.3 kg/m2 32.42 kg/m2  33.22 kg/m2      Latest Ref Rng & Units 12/27/2021    9:40 AM 03/01/2020   12:00 AM  Foot/eye exam completion dates  Eye Exam No Retinopathy  No Retinopathy      Foot Form Completion  Done      This result is from an external source.        Hypothyroid Managed by Endo , and controlled

## 2022-08-02 NOTE — Assessment & Plan Note (Signed)
Managed by Endo , and controlled

## 2022-08-02 NOTE — Assessment & Plan Note (Signed)
Controlled, no change in medication DASH diet and commitment to daily physical activity for a minimum of 30 minutes discussed and encouraged, as a part of hypertension management. The importance of attaining a healthy weight is also discussed.     08/01/2022   10:27 AM 08/01/2022   10:02 AM 06/20/2022    9:25 AM 06/20/2022    9:11 AM 05/17/2022    9:43 AM 02/22/2022   10:57 AM 02/22/2022   10:25 AM  BP/Weight  Systolic BP 375 436 067 703 403 524 818  Diastolic BP 72 72 62 62 68 70 73  Wt. (Lbs)  209  206.2 207  212.08  BMI  32.73 kg/m2  32.3 kg/m2 32.42 kg/m2  33.22 kg/m2

## 2022-08-02 NOTE — Assessment & Plan Note (Signed)
Hyperlipidemia:Low fat diet discussed and encouraged.   Lipid Panel  Lab Results  Component Value Date   CHOL 154 02/19/2022   HDL 41 02/19/2022   LDLCALC 93 02/19/2022   TRIG 108 02/19/2022   CHOLHDL 3.3 06/30/2021     Controlled, no change in medication Updated lab needed at/ before next visit.

## 2022-08-02 NOTE — Assessment & Plan Note (Signed)
Managed by endo, uncontrolled but working on this Kimberly Freeman is reminded of the importance of commitment to daily physical activity for 30 minutes or more, as able and the need to limit carbohydrate intake to 30 to 60 grams per meal to help with blood sugar control.   The need to take medication as prescribed, test blood sugar as directed, and to call between visits if there is a concern that blood sugar is uncontrolled is also discussed.   Kimberly Freeman is reminded of the importance of daily foot exam, annual eye examination, and good blood sugar, blood pressure and cholesterol control.     Latest Ref Rng & Units 05/17/2022    9:51 AM 05/14/2022    9:11 AM 02/19/2022   10:38 AM 02/05/2022   11:11 AM 01/02/2022    9:47 AM  Diabetic Labs  HbA1c 4.0 - 5.6 % 9.9     10.2   Chol 100 - 199 mg/dL   154     HDL >39 mg/dL   41     Calc LDL 0 - 99 mg/dL   93     Triglycerides 0 - 149 mg/dL   108     Creatinine 0.57 - 1.00 mg/dL  1.23  1.15  1.15        08/01/2022   10:27 AM 08/01/2022   10:02 AM 06/20/2022    9:25 AM 06/20/2022    9:11 AM 05/17/2022    9:43 AM 02/22/2022   10:57 AM 02/22/2022   10:25 AM  BP/Weight  Systolic BP 657 846 962 952 841 324 401  Diastolic BP 72 72 62 62 68 70 73  Wt. (Lbs)  209  206.2 207  212.08  BMI  32.73 kg/m2  32.3 kg/m2 32.42 kg/m2  33.22 kg/m2      Latest Ref Rng & Units 12/27/2021    9:40 AM 03/01/2020   12:00 AM  Foot/eye exam completion dates  Eye Exam No Retinopathy  No Retinopathy      Foot Form Completion  Done      This result is from an external source.

## 2022-08-06 DIAGNOSIS — Z23 Encounter for immunization: Secondary | ICD-10-CM | POA: Diagnosis not present

## 2022-08-08 ENCOUNTER — Inpatient Hospital Stay: Payer: Medicare Other | Attending: Physician Assistant

## 2022-08-08 DIAGNOSIS — N1832 Chronic kidney disease, stage 3b: Secondary | ICD-10-CM | POA: Insufficient documentation

## 2022-08-08 DIAGNOSIS — D649 Anemia, unspecified: Secondary | ICD-10-CM

## 2022-08-08 DIAGNOSIS — R7989 Other specified abnormal findings of blood chemistry: Secondary | ICD-10-CM

## 2022-08-08 DIAGNOSIS — D631 Anemia in chronic kidney disease: Secondary | ICD-10-CM | POA: Diagnosis not present

## 2022-08-08 DIAGNOSIS — N183 Chronic kidney disease, stage 3 unspecified: Secondary | ICD-10-CM

## 2022-08-08 LAB — CBC WITH DIFFERENTIAL/PLATELET
Abs Immature Granulocytes: 0.01 10*3/uL (ref 0.00–0.07)
Basophils Absolute: 0 10*3/uL (ref 0.0–0.1)
Basophils Relative: 1 %
Eosinophils Absolute: 0.1 10*3/uL (ref 0.0–0.5)
Eosinophils Relative: 2 %
HCT: 34.6 % — ABNORMAL LOW (ref 36.0–46.0)
Hemoglobin: 11.1 g/dL — ABNORMAL LOW (ref 12.0–15.0)
Immature Granulocytes: 0 %
Lymphocytes Relative: 36 %
Lymphs Abs: 1.5 10*3/uL (ref 0.7–4.0)
MCH: 30.8 pg (ref 26.0–34.0)
MCHC: 32.1 g/dL (ref 30.0–36.0)
MCV: 96.1 fL (ref 80.0–100.0)
Monocytes Absolute: 0.7 10*3/uL (ref 0.1–1.0)
Monocytes Relative: 16 %
Neutro Abs: 1.9 10*3/uL (ref 1.7–7.7)
Neutrophils Relative %: 45 %
Platelets: 184 10*3/uL (ref 150–400)
RBC: 3.6 MIL/uL — ABNORMAL LOW (ref 3.87–5.11)
RDW: 12.8 % (ref 11.5–15.5)
WBC: 4.3 10*3/uL (ref 4.0–10.5)
nRBC: 0 % (ref 0.0–0.2)

## 2022-08-08 LAB — COMPREHENSIVE METABOLIC PANEL
ALT: 37 U/L (ref 0–44)
AST: 29 U/L (ref 15–41)
Albumin: 3.6 g/dL (ref 3.5–5.0)
Alkaline Phosphatase: 98 U/L (ref 38–126)
Anion gap: 4 — ABNORMAL LOW (ref 5–15)
BUN: 21 mg/dL (ref 8–23)
CO2: 28 mmol/L (ref 22–32)
Calcium: 9.3 mg/dL (ref 8.9–10.3)
Chloride: 106 mmol/L (ref 98–111)
Creatinine, Ser: 1.24 mg/dL — ABNORMAL HIGH (ref 0.44–1.00)
GFR, Estimated: 43 mL/min — ABNORMAL LOW (ref >60)
Glucose, Bld: 336 mg/dL — ABNORMAL HIGH (ref 70–99)
Potassium: 4.5 mmol/L (ref 3.5–5.1)
Sodium: 138 mmol/L (ref 135–145)
Total Bilirubin: 0.7 mg/dL (ref 0.3–1.2)
Total Protein: 7.1 g/dL (ref 6.5–8.1)

## 2022-08-08 LAB — IRON AND TIBC
Iron: 41 ug/dL (ref 28–170)
Saturation Ratios: 14 % (ref 10.4–31.8)
TIBC: 292 ug/dL (ref 250–450)
UIBC: 251 ug/dL

## 2022-08-08 LAB — VITAMIN B12: Vitamin B-12: 645 pg/mL (ref 180–914)

## 2022-08-08 LAB — FERRITIN: Ferritin: 356 ng/mL — ABNORMAL HIGH (ref 11–307)

## 2022-08-10 LAB — METHYLMALONIC ACID, SERUM: Methylmalonic Acid, Quantitative: 200 nmol/L (ref 0–378)

## 2022-08-13 DIAGNOSIS — Z794 Long term (current) use of insulin: Secondary | ICD-10-CM | POA: Diagnosis not present

## 2022-08-13 DIAGNOSIS — E785 Hyperlipidemia, unspecified: Secondary | ICD-10-CM | POA: Diagnosis not present

## 2022-08-13 DIAGNOSIS — E559 Vitamin D deficiency, unspecified: Secondary | ICD-10-CM | POA: Diagnosis not present

## 2022-08-13 DIAGNOSIS — E1165 Type 2 diabetes mellitus with hyperglycemia: Secondary | ICD-10-CM | POA: Diagnosis not present

## 2022-08-14 ENCOUNTER — Encounter: Payer: Self-pay | Admitting: Family Medicine

## 2022-08-15 ENCOUNTER — Inpatient Hospital Stay (HOSPITAL_BASED_OUTPATIENT_CLINIC_OR_DEPARTMENT_OTHER): Payer: Medicare Other | Admitting: Physician Assistant

## 2022-08-15 ENCOUNTER — Encounter: Payer: Self-pay | Admitting: Physician Assistant

## 2022-08-15 DIAGNOSIS — N1832 Chronic kidney disease, stage 3b: Secondary | ICD-10-CM

## 2022-08-15 DIAGNOSIS — D649 Anemia, unspecified: Secondary | ICD-10-CM

## 2022-08-15 DIAGNOSIS — D631 Anemia in chronic kidney disease: Secondary | ICD-10-CM | POA: Diagnosis not present

## 2022-08-15 NOTE — Progress Notes (Signed)
Virtual Visit via Telephone Note [facility]  I connected with [name]  on 08/15/22 at 2:05 PM by telephone and verified that I am speaking with the correct person using two identifiers.  Location: Patient: Home Provider: [facility]   I discussed the limitations, risks, security and privacy concerns of performing an evaluation and management service by telephone and the availability of in person appointments. I also discussed with the patient that there may be a patient responsible charge related to this service. The patient expressed understanding and agreed to proceed.  REASON FOR VISIT:  Follow-up for anemia of iron deficiency and CKD   CURRENT THERAPY: Observation   INTERVAL HISTORY:  Kimberly Freeman 81 y.o. female returns for routine follow-up of her anemia secondary to CKD stage III and iron deficiency.  She was last seen by Tarri Abernethy PA-C on 08/09/2021.  At today's visit, she reports that she is doing well.  She denies any recent hospitalizations, new diagnoses, or changes in her baseline health status.  She reports that she is no longer taking oral iron or B12 supplements at home.  She has not noted any major blood loss such as hematemesis, hematochezia, or melena.  She has chronic dyspnea on exertion which is at baseline.  She has some intermittent fatigue which varies day today ever since she started her thyroid medication.  She denies any pica, restless legs, chest pain, dyspnea exertion, lightheadedness, or syncope.  She has no specific complaints at the time of today's visit.   She has 80% energy and 100% appetite. She endorses that she is maintaining a stable weight.    OBSERVATIONS/OBJECTIVE: Review of Systems  Constitutional:  Positive for malaise/fatigue. Negative for chills, diaphoresis, fever and weight loss.  Respiratory:  Positive for shortness of breath (with exertion). Negative for cough.   Cardiovascular:  Negative for  chest pain and palpitations.  Gastrointestinal:  Positive for constipation (Miralax). Negative for abdominal pain, blood in stool, melena, nausea and vomiting.  Neurological:  Positive for tingling (diabetic neuropathy in toes). Negative for dizziness and headaches.     PHYSICAL EXAM (per limitations of virtual telephone visit): The patient is alert and oriented x 3, exhibiting adequate mentation, good mood, and ability to speak in full sentences and execute sound judgement.   ASSESSMENT & PLAN: 1.  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 pylori - Colonoscopy (07/18/2017): Internal and external hemorrhoids - SPEP and IFE in 2018 were negative for monoclonal gammopathy - No major bleeding events such as hematemesis, hematochezia, or melena - Energy is at baseline.  No pica. - Most recent labs (08/08/2022): Hgb 11.1/MCV 96.1, ferritin 356, iron saturation 14%.  B12 elevated at 1940, normal MMA. - Anemia is primarily secondary to CKD stage III, but hemoglobin remains at goal.  If Hgb drops to 9.0-10.0 despite adequate iron, would consider starting patient on ESA. - PLAN: Repeat labs (CBC, CMP, iron panel, B12/MMA, folate) in 6 months with office visit 1 week after.     2.  Chronic kidney disease, stage IIIb - CMP (02/05/2022) shows creatinine 1.24/GFR 43, in keeping with her CKD stage IIIb. - Kidney function appears to be at her baseline CKD - PLAN: Continue to encourage adequate water intake and avoidance of NSAIDs.  Patient can discuss with PCP regarding whether or  not she needs referral to nephrologist.   3.  History of elevated LFTs  - LFTs were noted to be minimally elevated at her visit in November 2021 - Appears to be self-limited and has resolved without intervention     FOLLOW UP INSTRUCTIONS: Labs in 6 months RTC  after labs    I discussed the assessment and treatment plan with the patient. The patient was provided an opportunity to ask questions and all were answered. The patient agreed with the plan and demonstrated an understanding of the instructions.   The patient was advised to call back or seek an in-person evaluation if the symptoms worsen or if the condition fails to improve as anticipated.  I provided 22 minutes of non-face-to-face time during this encounter.   [name], PA-C 08/15/22 8:18 PM

## 2022-08-16 LAB — CMP14+EGFR
ALT: 32 IU/L (ref 0–32)
AST: 22 IU/L (ref 0–40)
Albumin/Globulin Ratio: 1.6 (ref 1.2–2.2)
Albumin: 4.3 g/dL (ref 3.7–4.7)
Alkaline Phosphatase: 118 IU/L (ref 44–121)
BUN/Creatinine Ratio: 17 (ref 12–28)
BUN: 21 mg/dL (ref 8–27)
Bilirubin Total: 0.3 mg/dL (ref 0.0–1.2)
CO2: 24 mmol/L (ref 20–29)
Calcium: 10 mg/dL (ref 8.7–10.3)
Chloride: 104 mmol/L (ref 96–106)
Creatinine, Ser: 1.25 mg/dL — ABNORMAL HIGH (ref 0.57–1.00)
Globulin, Total: 2.7 g/dL (ref 1.5–4.5)
Glucose: 135 mg/dL — ABNORMAL HIGH (ref 70–99)
Potassium: 4.4 mmol/L (ref 3.5–5.2)
Sodium: 143 mmol/L (ref 134–144)
Total Protein: 7 g/dL (ref 6.0–8.5)
eGFR: 43 mL/min/{1.73_m2} — ABNORMAL LOW (ref >59)

## 2022-08-16 LAB — MICROALBUMIN / CREATININE URINE RATIO
Creatinine, Urine: 80.6 mg/dL
Microalb/Creat Ratio: 4 mg/g creat (ref 0–29)
Microalbumin, Urine: 3.2 ug/mL

## 2022-08-16 LAB — VITAMIN D 25 HYDROXY (VIT D DEFICIENCY, FRACTURES): Vit D, 25-Hydroxy: 51 ng/mL (ref 30.0–100.0)

## 2022-08-16 LAB — LIPID PANEL
Chol/HDL Ratio: 3.6 ratio (ref 0.0–4.4)
Cholesterol, Total: 141 mg/dL (ref 100–199)
HDL: 39 mg/dL — ABNORMAL LOW (ref >39)
LDL Chol Calc (NIH): 82 mg/dL (ref 0–99)
Triglycerides: 109 mg/dL (ref 0–149)
VLDL Cholesterol Cal: 20 mg/dL (ref 5–40)

## 2022-08-31 ENCOUNTER — Ambulatory Visit (HOSPITAL_COMMUNITY)
Admission: RE | Admit: 2022-08-31 | Discharge: 2022-08-31 | Disposition: A | Discharge status: Home / Self Care | Payer: Medicare Other | Source: Ambulatory Visit | Attending: Family Medicine | Admitting: Family Medicine

## 2022-08-31 DIAGNOSIS — Z1231 Encounter for screening mammogram for malignant neoplasm of breast: Secondary | ICD-10-CM | POA: Insufficient documentation

## 2022-09-12 DIAGNOSIS — E039 Hypothyroidism, unspecified: Secondary | ICD-10-CM | POA: Diagnosis not present

## 2022-09-13 LAB — TSH: TSH: 0.849 u[IU]/mL (ref 0.450–4.500)

## 2022-09-13 LAB — T4, FREE: Free T4: 1.05 ng/dL (ref 0.82–1.77)

## 2022-09-17 ENCOUNTER — Encounter: Payer: Self-pay | Admitting: Nurse Practitioner

## 2022-09-17 ENCOUNTER — Ambulatory Visit (INDEPENDENT_AMBULATORY_CARE_PROVIDER_SITE_OTHER): Payer: Medicare Other | Admitting: Nurse Practitioner

## 2022-09-17 VITALS — BP 131/68 | HR 62 | Ht 67.0 in | Wt 215.0 lb

## 2022-09-17 DIAGNOSIS — E559 Vitamin D deficiency, unspecified: Secondary | ICD-10-CM | POA: Diagnosis not present

## 2022-09-17 DIAGNOSIS — E1122 Type 2 diabetes mellitus with diabetic chronic kidney disease: Secondary | ICD-10-CM

## 2022-09-17 DIAGNOSIS — E782 Mixed hyperlipidemia: Secondary | ICD-10-CM | POA: Diagnosis not present

## 2022-09-17 DIAGNOSIS — Z794 Long term (current) use of insulin: Secondary | ICD-10-CM | POA: Diagnosis not present

## 2022-09-17 DIAGNOSIS — I10 Essential (primary) hypertension: Secondary | ICD-10-CM | POA: Diagnosis not present

## 2022-09-17 DIAGNOSIS — N182 Chronic kidney disease, stage 2 (mild): Secondary | ICD-10-CM | POA: Diagnosis not present

## 2022-09-17 DIAGNOSIS — E039 Hypothyroidism, unspecified: Secondary | ICD-10-CM | POA: Diagnosis not present

## 2022-09-17 LAB — POCT GLYCOSYLATED HEMOGLOBIN (HGB A1C): Hemoglobin A1C: 9.6 % — AB (ref 4.0–5.6)

## 2022-09-17 NOTE — Progress Notes (Signed)
09/17/2022, 10:35 AM  Endocrinology follow-up note  Subjective:    Patient ID: Kimberly Freeman, female    DOB: 1941/05/21.  Kimberly Freeman is being seen in consultation for management of currently uncontrolled symptomatic diabetes requested by  [name], MD.   Past Medical History:  Diagnosis Date   Arthritis    CAD (coronary artery disease) 08/2019   60% RI with other 20-30% lesions at cath   Diabetes mellitus, type 2 (HCC)    Essential hypertension    GERD (gastroesophageal reflux disease)    Hyperlipidemia    LVH (left ventricular hypertrophy)     Past Surgical History:  Procedure Laterality Date   ABDOMINAL HYSTERECTOMY     BIOPSY  07/18/2017   Procedure: BIOPSY;  Surgeon: [name], MD;  Location: [facility];  Service: Endoscopy;;  gastric   CHOLECYSTECTOMY  2004   COLONOSCOPY N/A 07/18/2017   Dr. [name]: Internal and external hemorrhoids.  No future screening/surveillance colonoscopies due to age.   ESOPHAGOGASTRODUODENOSCOPY N/A 07/18/2017   Dr. [name]: Small hiatal hernia, patchy mild inflammation characterized by congestion, erosions, erythema in the cardia, gastric body and antrum.  Diffuse moderate inflammation in the duodenal bulb and second portion duodenum.  Biopsies benign.  No H. pylori.   LEFT HEART CATH AND CORONARY ANGIOGRAPHY N/A 09/21/2019   Procedure: LEFT HEART CATH AND CORONARY ANGIOGRAPHY;  Surgeon: [name], MD;  Location: [facility];  Service: Cardiovascular;  Laterality: N/A;    Social History   Socioeconomic History   Marital status: Married    Spouse name: [name]    Number of children: 0   Years of education: 12+   Highest education level: 12th grade  Occupational History   Occupation: retired   Tobacco Use   Smoking status: Never   Smokeless tobacco: Never  Vaping Use   Vaping Use: Never used  Substance and Sexual Activity    Alcohol use: No    Alcohol/week: 0.0 standard drinks of alcohol   Drug use: No   Sexual activity: Not on file  Other Topics Concern   Not on file  Social History Narrative   Lives alone with husband    Social Determinants of Health   Financial Resource Strain: Low Risk  (09/30/2021)   Overall Financial Resource Strain (CARDIA)    Difficulty of Paying Living Expenses: Not hard at all  Food Insecurity: No Food Insecurity (09/30/2021)   Hunger Vital Sign    Worried About Running Out of Food in the Last Year: Never true    Ran Out of Food in the Last Year: Never true  Transportation Needs: No Transportation Needs (09/30/2021)   PRAPARE - Administrator, Civil Service (Medical): No    Lack of Transportation (Non-Medical): No  Physical Activity: Insufficiently Active (09/30/2021)   Exercise Vital Sign    Days of Exercise per Week: 3 days    Minutes of Exercise per Session: 30 min  Stress: No Stress Concern Present (09/30/2021)   [employer] of Occupational Health - Occupational Stress Questionnaire    Feeling of Stress : Not at all  Social Connections: Socially Integrated (  09/30/2021)   Social Connection and Isolation Panel [NHANES]    Frequency of Communication with Friends and Family: More than three times a week    Frequency of Social Gatherings with Friends and Family: More than three times a week    Attends Religious Services: More than 4 times per year    Active Member of [employer] or Organizations: Yes    Attends Engineer, structural: More than 4 times per year    Marital Status: Married    Family History  Problem Relation Age of Onset   Leukemia Mother    Cancer Mother    Diabetes Mother    Prostate cancer Father    Lung cancer Sister 26   Diabetes Sister    Colon cancer Neg Hx     Outpatient Encounter Medications as of 09/17/2022  Medication Sig   acetaminophen (TYLENOL) 500 MG tablet Take 500 mg by mouth daily as needed for mild pain or  moderate pain. For pain   amLODipine (NORVASC) 10 MG tablet Take 1 tablet (10 mg total) by mouth daily.   aspirin 81 MG chewable tablet Chew 1 tablet (81 mg total) by mouth daily.   atorvastatin (LIPITOR) 80 MG tablet TAKE 1 TABLET DAILY AT 6 P.M.   BD PEN NEEDLE MICRO U/F 32G X 6 MM MISC USE TO INJECT INSULIN DAILY   betamethasone dipropionate 0.05 % cream Apply topically 2 (two) times daily.   calcium-vitamin D (OSCAL WITH D) 500-200 MG-UNIT tablet Take 1 tablet by mouth.   Easy Touch Lancets 28G MISC USE TO TEST TWICE A DAY   glipiZIDE (GLUCOTROL XL) 2.5 MG 24 hr tablet Take 1 tablet (2.5 mg total) by mouth daily with breakfast.   insulin degludec (TRESIBA FLEXTOUCH) 100 UNIT/ML FlexTouch Pen Inject 38 Units into the skin at bedtime.   irbesartan (AVAPRO) 300 MG tablet TAKE 1 TABLET DAILY   Lancets (ONETOUCH ULTRASOFT) lancets USE AS DIRECTED TWICE DAILY   levothyroxine (SYNTHROID) 50 MCG tablet Take 1 tablet (50 mcg total) by mouth daily before breakfast.   metoprolol tartrate (LOPRESSOR) 100 MG tablet Take 1 tablet (100 mg total) by mouth 2 (two) times daily.   Multiple Vitamin (MULTIVITAMIN WITH MINERALS) TABS tablet Take 1 tablet by mouth daily.   ONETOUCH ULTRA test strip USE AS INSTRUCTED FOR TWICE A DAY TESTING   polyethylene glycol (MIRALAX / GLYCOLAX) 17 g packet Take 17 g by mouth daily as needed.   spironolactone (ALDACTONE) 50 MG tablet TAKE 1 TABLET DAILY   No facility-administered encounter medications on file as of 09/17/2022.    ALLERGIES: Allergies  Allergen Reactions   Poison Oak Extract [Poison Oak Extract] Hives   Sulfonamide Derivatives Itching    Burning sensation all over body   Levemir [Insulin Detemir] Rash    VACCINATION STATUS: Immunization History  Administered Date(s) Administered   Fluad Quad(high Dose 65+) 07/27/2019, 06/23/2020, 06/29/2021, 08/01/2022   Influenza Split 08/01/2011, 07/31/2012   Influenza Whole 07/29/2007, 07/20/2008, 07/21/2009,  07/19/2010   Influenza, High Dose Seasonal PF 07/23/2018   Influenza,inj,Quad PF,6+ Mos 08/07/2013, 06/15/2014, 07/19/2015, 06/28/2016, 07/25/2017   Moderna SARS-COV2 Booster Vaccination 07/26/2021   Moderna Sars-Covid-2 Vaccination 12/22/2019, 01/19/2020, 09/25/2020, 01/29/2021   Pneumococcal Conjugate-13 10/12/2014   Pneumococcal Polysaccharide-23 03/16/2004, 08/29/2012   Td 03/16/2004   Tdap 03/31/2012   Zoster, Live 01/02/2007    Diabetes She presents for her follow-up diabetic visit. She has type 2 diabetes mellitus. Onset time: She was diagnosed at approximate age of 5 years.  Her disease course has been improving. There are no hypoglycemic associated symptoms. Pertinent negatives for hypoglycemia include no confusion, headaches, pallor or seizures. Associated symptoms include fatigue. Pertinent negatives for diabetes include no chest pain, no polydipsia, no polyphagia and no polyuria. There are no hypoglycemic complications. Symptoms are stable. Diabetic complications include heart disease and nephropathy. Risk factors for coronary artery disease include diabetes mellitus, dyslipidemia, family history, hypertension, obesity, sedentary lifestyle and post-menopausal. Current diabetic treatment includes insulin injections and oral agent (monotherapy). She is compliant with treatment most of the time. Her weight is increasing steadily. She is following a generally healthy diet. When asked about meal planning, she reported none. She has had a previous visit with a dietitian. She participates in exercise intermittently. Her home blood glucose trend is fluctuating minimally. Her breakfast blood glucose range is generally 110-130 mg/dl. (She presents today with her meter and logs showing inconsistent glucose monitoring.  Her POCT A1c today is 9.6%, improving some from last visit of 9.9%.  She does have some mild anemia which may skew her results somewhat.  Analysis of her meter shows 7-day average of 111  with 3 readings; 14-day average of 142 with 6 readings; 30-day average of 153 with 12 readings.  She denies any significant hypoglycemia.) An ACE inhibitor/angiotensin II receptor blocker is not being taken. She sees a podiatrist.Eye exam is current.  Hyperlipidemia This is a chronic problem. The current episode started more than 1 year ago. The problem is controlled. Recent lipid tests were reviewed and are normal. Exacerbating diseases include chronic renal disease, diabetes and obesity. Factors aggravating her hyperlipidemia include beta blockers. Pertinent negatives include no chest pain, myalgias or shortness of breath. Current antihyperlipidemic treatment includes statins. The current treatment provides mild improvement of lipids. There are no compliance problems.  Risk factors for coronary artery disease include diabetes mellitus, dyslipidemia, obesity, hypertension, a sedentary lifestyle and post-menopausal.  Hypertension This is a chronic problem. The current episode started more than 1 year ago. The problem has been resolved since onset. The problem is controlled. Pertinent negatives include no chest pain, headaches, palpitations or shortness of breath. There are no associated agents to hypertension. Risk factors for coronary artery disease include sedentary lifestyle, obesity, post-menopausal state, diabetes mellitus and dyslipidemia. Past treatments include calcium channel blockers, beta blockers and diuretics. The current treatment provides mild improvement. There are no compliance problems.  Hypertensive end-organ damage includes kidney disease and CAD/MI. Identifiable causes of hypertension include chronic renal disease.    Review of systems  Constitutional: + Minimally fluctuating body weight,  current Body mass index is 33.67 kg/m. , no fatigue, no subjective hyperthermia, no subjective hypothermia Eyes: no blurry vision, no xerophthalmia ENT: no sore throat, no nodules palpated in  throat, no dysphagia/odynophagia, no hoarseness Cardiovascular: no chest pain, no shortness of breath, no palpitations, no leg swelling Respiratory: no cough, no shortness of breath Gastrointestinal: no nausea/vomiting/diarrhea Musculoskeletal: no muscle/joint aches Skin: no rashes, no hyperemia Neurological: no tremors, no numbness, no tingling, no dizziness Psychiatric: no depression, no anxiety   Objective:    BP 131/68 (BP Location: Left Arm, Patient Position: Sitting, Cuff Size: Large)   Pulse 62   Ht  (1.702 m)   Wt 215 lb (97.5 kg)   BMI 33.67 kg/m   Wt Readings from Last 3 Encounters:  09/17/22 215 lb (97.5 kg)  08/01/22 209 lb (94.8 kg)  06/20/22 206 lb 3.2 oz (93.5 kg)    BP Readings from Last 3 Encounters:  09/17/22 131/68  08/01/22 120/72  06/20/22 138/62     Physical Exam- Limited  Constitutional:  Body mass index is 33.67 kg/m. , not in acute distress, normal state of mind Eyes:  EOMI, no exophthalmos Musculoskeletal: no gross deformities, strength intact in all four extremities, no gross restriction of joint movements Skin:  no rashes, no hyperemia Neurological: no tremor with outstretched hands    CMP     Component Value Date/Time   NA 143 08/13/2022 1011   K 4.4 08/13/2022 1011   CL 104 08/13/2022 1011   CO2 24 08/13/2022 1011   GLUCOSE 135 (H) 08/13/2022 1011   GLUCOSE 336 (H) 08/08/2022 1040   BUN 21 08/13/2022 1011   CREATININE 1.25 (H) 08/13/2022 1011   CREATININE 1.14 (H) 12/15/2019 1048   CALCIUM 10.0 08/13/2022 1011   PROT 7.0 08/13/2022 1011   ALBUMIN 4.3 08/13/2022 1011   AST 22 08/13/2022 1011   ALT 32 08/13/2022 1011   ALKPHOS 118 08/13/2022 1011   BILITOT 0.3 08/13/2022 1011   GFRNONAA 43 (L) 08/08/2022 1040   GFRNONAA 45 (L) 12/15/2019 1048   GFRAA 48 (L) 09/05/2020 0810   GFRAA 53 (L) 12/15/2019 1048     Diabetic Labs (most recent): Lab Results  Component Value Date   HGBA1C 9.6 (A) 09/17/2022   HGBA1C 9.9  05/17/2022   HGBA1C 10.2 (A) 01/02/2022   MICROALBUR 1.5 07/24/2019   MICROALBUR 0.7 04/02/2018   MICROALBUR 8.8 (H) 11/27/2017     Lipid Panel ( most recent) Lipid Panel     Component Value Date/Time   CHOL 141 08/13/2022 1011   TRIG 109 08/13/2022 1011   HDL 39 (L) 08/13/2022 1011   CHOLHDL 3.6 08/13/2022 1011   CHOLHDL 3.3 12/15/2019 1048   VLDL 21 03/22/2017 0803   LDLCALC 82 08/13/2022 1011   LDLCALC 80 12/15/2019 1048   LABVLDL 20 08/13/2022 1011      Lab Results  Component Value Date   TSH 0.849 09/12/2022   TSH 4.710 (H) 05/14/2022   TSH 4.290 12/25/2021   TSH 8.400 (H) 08/29/2021   TSH 7.640 (H) 04/28/2021   TSH 4.360 05/23/2020   TSH 6.85 (H) 07/24/2019   TSH 5.19 (H) 08/04/2018   TSH 3.81 04/10/2016   TSH 2.410 12/13/2015   FREET4 1.05 09/12/2022   FREET4 0.79 (L) 05/14/2022   FREET4 0.83 12/25/2021   FREET4 0.75 (L) 08/29/2021   FREET4 0.75 (L) 04/28/2021   FREET4 1.0 12/15/2019   FREET4 0.8 07/24/2019   FREET4 0.9 08/04/2018   FREET4 0.78 12/13/2015   FREET4 0.84 11/14/2015       Assessment & Plan:   1) Type 2 diabetes mellitus with stage 2 chronic kidney disease, with long-term current use of insulin (HCC)  - CIMONE FAHEY has currently uncontrolled symptomatic type 2 DM since  81 years of age. Recent labs reviewed.  She presents today with her meter and logs showing inconsistent glucose monitoring.  Her POCT A1c today is 9.6%, improving some from last visit of 9.9%.  She does have some mild anemia which may skew her results somewhat.  Analysis of her meter shows 7-day average of 111 with 3 readings; 14-day average of 142 with 6 readings; 30-day average of 153 with 12 readings.  She denies any significant hypoglycemia.  - I had a long discussion with her about the progressive nature of diabetes and the pathology behind its complications. -her diabetes is complicated by stage 2 CKD, obesity /sedentary life,  and she remains at a high risk for  more acute and chronic complications which include CAD, CVA, CKD, retinopathy, and neuropathy. These are all discussed in detail with her.  - Nutritional counseling repeated at each appointment due to patients tendency to fall back in to old habits.  - The patient admits there is a room for improvement in their diet and drink choices. -  Suggestion is made for the patient to avoid simple carbohydrates from their diet including Cakes, Sweet Desserts / Pastries, Ice Cream, Soda (diet and regular), Sweet Tea, Candies, Chips, Cookies, Sweet Pastries, Store Bought Juices, Alcohol in Excess of 1-2 drinks a day, Artificial Sweeteners, Coffee Creamer, and "Sugar-free" Products. This will help patient to have stable blood glucose profile and potentially avoid unintended weight gain.   - I encouraged the patient to switch to unprocessed or minimally processed complex starch and increased protein intake (animal or plant source), fruits, and vegetables.   - Patient is advised to stick to a routine mealtimes to eat 3 meals a day and avoid unnecessary snacks (to snack only to correct hypoglycemia).  - I have approached her with the following individualized plan to manage  her diabetes and patient agrees:   - she will continue to need at least basal insulin in order for her to achieve control of diabetes to target.    -due to the lack of consistent monitoring, no changes will be made to her medications today.  She is advised to continue her Tresiba 38 units SQ nightly and Glipizide 2.5 mg XL daily with breakfast.    -She is encouraged to continue monitoring glucose twice daily (consistently), before breakfast and before bed, and to call the clinic if she has readings less than 70 or greater than 300 for 3 tests in a row.  -She will be considered for incretin therapy during her next visit.  - Specific targets for  A1c;  LDL, HDL,  and Triglycerides were discussed with the patient.  2) Blood Pressure  /Hypertension:  Her blood pressure is controlled to target for her age.  She is advised to continue Norvasc 10 mg po daily, Irbesartan 300 mg po daily, Metoprolol 100 mg po twice daily, and Aldactone 50 mg po daily.  3) Lipids/Hyperlipidemia:    Her most recent lipid panel from 08/13/22 shows controlled LDL at 82.  She is advised to continue Atorvastatin 80 mg po daily at bedtime.  Side effects and precautions discussed with her.  4)  Weight/Diet:  Her Body mass index is 33.67 kg/m.  -   clearly complicating her diabetes care.   she is  a candidate for weight loss. I discussed with her the fact that loss of 5 - 10% of her  current body weight will have the most impact on her diabetes management.  Exercise, and detailed carbohydrates information provided  -  detailed on discharge instructions.  5) Hypothyroidism- unspecified Her previsit thyroid function tests are consistent with appropriate hormone replacement.  She is advised to continue Levothyroxine 50 mcg po daily before breakfast.    - The correct intake of thyroid hormone (Levothyroxine, Synthroid), is on empty stomach first thing in the morning, with water, separated by at least 30 minutes from breakfast and other medications,  and separated by more than 4 hours from calcium, iron, multivitamins, acid reflux medications (PPIs).  - This medication is a life-long medication and will be needed to correct thyroid hormone imbalances for the rest of your life.  The dose  may change from time to time, based on thyroid blood work.  - It is extremely important to be consistent taking this medication, near the same time each morning.  -AVOID TAKING PRODUCTS CONTAINING BIOTIN (commonly found in Hair, Skin, Nails vitamins) AS IT INTERFERES WITH THE VALIDITY OF THYROID FUNCTION BLOOD TESTS.  6) Chronic Care/Health Maintenance: -she is on Statin medications and  is encouraged to initiate and continue to follow up with Ophthalmology, Dentist,   Podiatrist at least yearly or according to recommendations, and advised to stay away from smoking. I have recommended yearly flu vaccine and pneumonia vaccine at least every 5 years; moderate intensity exercise for up to 150 minutes weekly; and  sleep for at least 7 hours a day.  - she is advised to maintain close follow up with Kerri PerchesSimpson, Margaret E, MD for primary care needs, as well as her other providers for optimal and coordinated care.     I spent 34 minutes in the care of the patient today including review of labs from CMP, Lipids, Thyroid Function, Hematology (current and previous including abstractions from other facilities); face-to-face time discussing  her blood glucose readings/logs, discussing hypoglycemia and hyperglycemia episodes and symptoms, medications doses, her options of short and long term treatment based on the latest standards of care / guidelines;  discussion about incorporating lifestyle medicine;  and documenting the encounter. Risk reduction counseling performed per USPSTF guidelines to reduce obesity and cardiovascular risk factors.     Please refer to Patient Instructions for Blood Glucose Monitoring and Insulin/Medications Dosing Guide"  in media tab for additional information. Please  also refer to " Patient Self Inventory" in the Media  tab for reviewed elements of pertinent patient history.  Mauro KaufmannAnne M Goldring participated in the discussions, expressed understanding, and voiced agreement with the above plans.  All questions were answered to her satisfaction. she is encouraged to contact clinic should she have any questions or concerns prior to her return visit.   Follow up plan: - Return in about 4 months (around 01/16/2023) for Diabetes F/U with A1c in office, No previsit labs, Bring meter and logs.  Ronny BaconWhitney Devita Nies, [facility] Shoal CreekFNP-BC [facility] [address] [place], [place] [address] Phone: [phone] Fax: [phone]  09/17/2022, 10:35  AM

## 2022-09-17 NOTE — Patient Instructions (Signed)

## 2022-09-27 DIAGNOSIS — E119 Type 2 diabetes mellitus without complications: Secondary | ICD-10-CM | POA: Diagnosis not present

## 2022-09-27 LAB — HM DIABETES EYE EXAM

## 2022-10-26 ENCOUNTER — Other Ambulatory Visit: Payer: Self-pay | Admitting: Nurse Practitioner

## 2022-10-30 ENCOUNTER — Other Ambulatory Visit: Payer: Self-pay | Admitting: Cardiology

## 2022-11-05 ENCOUNTER — Other Ambulatory Visit: Payer: Self-pay | Admitting: Family Medicine

## 2022-11-13 DIAGNOSIS — B351 Tinea unguium: Secondary | ICD-10-CM | POA: Diagnosis not present

## 2022-11-13 DIAGNOSIS — E1142 Type 2 diabetes mellitus with diabetic polyneuropathy: Secondary | ICD-10-CM | POA: Diagnosis not present

## 2022-11-26 ENCOUNTER — Other Ambulatory Visit: Payer: Self-pay | Admitting: Family Medicine

## 2022-12-04 ENCOUNTER — Other Ambulatory Visit: Payer: Self-pay | Admitting: Family Medicine

## 2022-12-10 ENCOUNTER — Other Ambulatory Visit: Payer: Self-pay | Admitting: Family Medicine

## 2022-12-14 ENCOUNTER — Other Ambulatory Visit: Payer: Self-pay | Admitting: Family Medicine

## 2022-12-19 ENCOUNTER — Other Ambulatory Visit: Payer: Self-pay | Admitting: Nurse Practitioner

## 2023-01-09 ENCOUNTER — Other Ambulatory Visit: Payer: Self-pay

## 2023-01-09 DIAGNOSIS — I10 Essential (primary) hypertension: Secondary | ICD-10-CM | POA: Diagnosis not present

## 2023-01-09 DIAGNOSIS — E785 Hyperlipidemia, unspecified: Secondary | ICD-10-CM | POA: Diagnosis not present

## 2023-01-09 DIAGNOSIS — E559 Vitamin D deficiency, unspecified: Secondary | ICD-10-CM

## 2023-01-10 LAB — CMP14+EGFR
ALT: 20 IU/L (ref 0–32)
AST: 19 IU/L (ref 0–40)
Albumin/Globulin Ratio: 1.6 (ref 1.2–2.2)
Albumin: 4.4 g/dL (ref 3.7–4.7)
Alkaline Phosphatase: 119 IU/L (ref 44–121)
BUN/Creatinine Ratio: 23 (ref 12–28)
BUN: 26 mg/dL (ref 8–27)
Bilirubin Total: 0.4 mg/dL (ref 0.0–1.2)
CO2: 23 mmol/L (ref 20–29)
Calcium: 10 mg/dL (ref 8.7–10.3)
Chloride: 106 mmol/L (ref 96–106)
Creatinine, Ser: 1.11 mg/dL — ABNORMAL HIGH (ref 0.57–1.00)
Globulin, Total: 2.8 g/dL (ref 1.5–4.5)
Glucose: 133 mg/dL — ABNORMAL HIGH (ref 70–99)
Potassium: 4 mmol/L (ref 3.5–5.2)
Sodium: 145 mmol/L — ABNORMAL HIGH (ref 134–144)
Total Protein: 7.2 g/dL (ref 6.0–8.5)
eGFR: 49 mL/min/{1.73_m2} — ABNORMAL LOW (ref >59)

## 2023-01-10 LAB — VITAMIN D 25 HYDROXY (VIT D DEFICIENCY, FRACTURES): Vit D, 25-Hydroxy: 56.1 ng/mL (ref 30.0–100.0)

## 2023-01-10 LAB — LIPID PANEL
Chol/HDL Ratio: 3.2 ratio (ref 0.0–4.4)
Cholesterol, Total: 133 mg/dL (ref 100–199)
HDL: 42 mg/dL (ref >39)
LDL Chol Calc (NIH): 73 mg/dL (ref 0–99)
Triglycerides: 94 mg/dL (ref 0–149)
VLDL Cholesterol Cal: 18 mg/dL (ref 5–40)

## 2023-01-16 ENCOUNTER — Ambulatory Visit (INDEPENDENT_AMBULATORY_CARE_PROVIDER_SITE_OTHER): Payer: Medicare Other | Admitting: Nurse Practitioner

## 2023-01-16 ENCOUNTER — Encounter: Payer: Self-pay | Admitting: Nurse Practitioner

## 2023-01-16 VITALS — BP 142/70 | HR 59 | Ht 67.0 in | Wt 211.2 lb

## 2023-01-16 DIAGNOSIS — N1831 Chronic kidney disease, stage 3a: Secondary | ICD-10-CM | POA: Diagnosis not present

## 2023-01-16 DIAGNOSIS — E559 Vitamin D deficiency, unspecified: Secondary | ICD-10-CM

## 2023-01-16 DIAGNOSIS — I10 Essential (primary) hypertension: Secondary | ICD-10-CM | POA: Diagnosis not present

## 2023-01-16 DIAGNOSIS — Z794 Long term (current) use of insulin: Secondary | ICD-10-CM | POA: Diagnosis not present

## 2023-01-16 DIAGNOSIS — E782 Mixed hyperlipidemia: Secondary | ICD-10-CM | POA: Diagnosis not present

## 2023-01-16 DIAGNOSIS — E039 Hypothyroidism, unspecified: Secondary | ICD-10-CM | POA: Diagnosis not present

## 2023-01-16 DIAGNOSIS — E1122 Type 2 diabetes mellitus with diabetic chronic kidney disease: Secondary | ICD-10-CM

## 2023-01-16 LAB — POCT GLYCOSYLATED HEMOGLOBIN (HGB A1C): Hemoglobin A1C: 7.6 % — AB (ref 4.0–5.6)

## 2023-01-16 MED ORDER — TRESIBA FLEXTOUCH 100 UNIT/ML ~~LOC~~ SOPN: 28.0000 [IU] | PEN_INJECTOR | Freq: Every day | SUBCUTANEOUS | 3 refills | Status: DC

## 2023-01-16 NOTE — Progress Notes (Signed)
01/16/2023, 10:28 AM  Endocrinology follow-up note  Subjective:    Patient ID: Kimberly Freeman, female    DOB: 09-Jun-1941.  Kimberly Freeman is being seen in consultation for management of currently uncontrolled symptomatic diabetes requested by  Fayrene Helper, MD.   Past Medical History:  Diagnosis Date   Arthritis    CAD (coronary artery disease) 08/2019   60% RI with other 20-30% lesions at cath   Diabetes mellitus, type 2 ([place])    Essential hypertension    GERD (gastroesophageal reflux disease)    Hyperlipidemia    LVH (left ventricular hypertrophy)     Past Surgical History:  Procedure Laterality Date   ABDOMINAL HYSTERECTOMY     BIOPSY  07/18/2017   Procedure: BIOPSY;  Surgeon: Danie Binder, MD;  Location: [facility];  Service: Endoscopy;;  gastric   CHOLECYSTECTOMY  2004   COLONOSCOPY N/A 07/18/2017   Dr. Oneida Alar: Internal and external hemorrhoids.  No future screening/surveillance colonoscopies due to age.   ESOPHAGOGASTRODUODENOSCOPY N/A 07/18/2017   Dr. Oneida Alar: Small hiatal hernia, patchy mild inflammation characterized by congestion, erosions, erythema in the cardia, gastric body and antrum.  Diffuse moderate inflammation in the duodenal bulb and second portion duodenum.  Biopsies benign.  No H. pylori.   LEFT HEART CATH AND CORONARY ANGIOGRAPHY N/A 09/21/2019   Procedure: LEFT HEART CATH AND CORONARY ANGIOGRAPHY;  Surgeon: Troy Sine, MD;  Location: [facility];  Service: Cardiovascular;  Laterality: N/A;    Social History   Socioeconomic History   Marital status: Married    Spouse name: Winferd Humphrey    Number of children: 0   Years of education: 12+   Highest education level: 12th grade  Occupational History   Occupation: retired   Tobacco Use   Smoking status: Never   Smokeless tobacco: Never  Vaping Use   Vaping Use: Never used  Substance and Sexual Activity    Alcohol use: No    Alcohol/week: 0.0 standard drinks of alcohol   Drug use: No   Sexual activity: Not on file  Other Topics Concern   Not on file  Social History Narrative   Lives alone with husband    Social Determinants of Health   Financial Resource Strain: Low Risk  (09/30/2021)   Overall Financial Resource Strain (CARDIA)    Difficulty of Paying Living Expenses: Not hard at all  Food Insecurity: No Food Insecurity (09/30/2021)   Hunger Vital Sign    Worried About Running Out of Food in the Last Year: Never true    Ran Out of Food in the Last Year: Never true  Transportation Needs: No Transportation Needs (09/30/2021)   PRAPARE - Hydrologist (Medical): No    Lack of Transportation (Non-Medical): No  Physical Activity: Insufficiently Active (09/30/2021)   Exercise Vital Sign    Days of Exercise per Week: 3 days    Minutes of Exercise per Session: 30 min  Stress: No Stress Concern Present (09/30/2021)   [place]    Feeling of Stress : Not at all  Social Connections: [place] (  09/30/2021)   Social Connection and Isolation Panel [NHANES]    Frequency of Communication with Friends and Family: More than three times a week    Frequency of Social Gatherings with Friends and Family: More than three times a week    Attends Religious Services: More than 4 times per year    Active Member of [employer] or Organizations: Yes    Attends Music therapist: More than 4 times per year    Marital Status: Married    Family History  Problem Relation Age of Onset   Leukemia Mother    Cancer Mother    Diabetes Mother    Prostate cancer Father    Lung cancer Sister 71   Diabetes Sister    Colon cancer Neg Hx     Outpatient Encounter Medications as of 01/16/2023  Medication Sig   acetaminophen (TYLENOL) 500 MG tablet Take 500 mg by mouth daily as needed for mild pain or  moderate pain. For pain   amLODipine (NORVASC) 10 MG tablet TAKE 1 TABLET DAILY (DOSE INCREASE)   aspirin 81 MG chewable tablet Chew 1 tablet (81 mg total) by mouth daily.   atorvastatin (LIPITOR) 80 MG tablet TAKE 1 TABLET DAILY AT 6 P.M.   BD PEN NEEDLE MICRO U/F 32G X 6 MM MISC USE TO INJECT INSULIN DAILY   betamethasone dipropionate 0.05 % cream Apply topically 2 (two) times daily.   calcium-vitamin D (OSCAL WITH D) 500-200 MG-UNIT tablet Take 1 tablet by mouth.   Easy Touch Lancets 28G MISC USE TO TEST TWICE A DAY   glipiZIDE (GLUCOTROL XL) 2.5 MG 24 hr tablet TAKE 1 TABLET DAILY WITH BREAKFAST   irbesartan (AVAPRO) 300 MG tablet TAKE 1 TABLET DAILY   Lancets (ONETOUCH ULTRASOFT) lancets USE AS DIRECTED TWICE DAILY   levothyroxine (SYNTHROID) 50 MCG tablet TAKE 1 TABLET DAILY BEFORE BREAKFAST   metoprolol tartrate (LOPRESSOR) 100 MG tablet TAKE 1 TABLET TWICE A DAY   Multiple Vitamin (MULTIVITAMIN WITH MINERALS) TABS tablet Take 1 tablet by mouth daily.   ONETOUCH ULTRA test strip USE AS INSTRUCTED FOR TWICE A DAY TESTING   polyethylene glycol (MIRALAX / GLYCOLAX) 17 g packet Take 17 g by mouth daily as needed.   spironolactone (ALDACTONE) 50 MG tablet TAKE 1 TABLET DAILY   [DISCONTINUED] insulin degludec (TRESIBA FLEXTOUCH) 100 UNIT/ML FlexTouch Pen Inject 38 Units into the skin at bedtime.   insulin degludec (TRESIBA FLEXTOUCH) 100 UNIT/ML FlexTouch Pen Inject 28 Units into the skin at bedtime.   No facility-administered encounter medications on file as of 01/16/2023.    ALLERGIES: Allergies  Allergen Reactions   Poison Oak Extract [Poison Oak Extract] Hives   Sulfonamide Derivatives Itching    Burning sensation all over body   Levemir [Insulin Detemir] Rash    VACCINATION STATUS: Immunization History  Administered Date(s) Administered   Fluad Quad(high Dose 65+) 07/27/2019, 06/23/2020, 06/29/2021, 08/01/2022   Influenza Split 08/01/2011, 07/31/2012   Influenza Whole  07/29/2007, 07/20/2008, 07/21/2009, 07/19/2010   Influenza, High Dose Seasonal PF 07/23/2018   Influenza,inj,Quad PF,6+ Mos 08/07/2013, 06/15/2014, 07/19/2015, 06/28/2016, 07/25/2017   Moderna SARS-COV2 Booster Vaccination 07/26/2021   Moderna Sars-Covid-2 Vaccination 12/22/2019, 01/19/2020, 09/25/2020, 01/29/2021   Pneumococcal Conjugate-13 10/12/2014   Pneumococcal Polysaccharide-23 03/16/2004, 08/29/2012   Td 03/16/2004   Tdap 03/31/2012   Zoster, Live 01/02/2007    Diabetes She presents for her follow-up diabetic visit. She has type 2 diabetes mellitus. Onset time: She was diagnosed at approximate age of 9 years.  Her disease course has been improving. There are no hypoglycemic associated symptoms. Pertinent negatives for hypoglycemia include no confusion, headaches, pallor or seizures. Associated symptoms include fatigue. Pertinent negatives for diabetes include no chest pain, no polydipsia, no polyphagia and no polyuria. There are no hypoglycemic complications. Symptoms are stable. Diabetic complications include heart disease and nephropathy. Risk factors for coronary artery disease include diabetes mellitus, dyslipidemia, family history, hypertension, obesity, sedentary lifestyle and post-menopausal. Current diabetic treatment includes insulin injections and oral agent (monotherapy). She is compliant with treatment most of the time. Her weight is fluctuating minimally. She is following a generally healthy diet. When asked about meal planning, she reported none. She has had a previous visit with a dietitian. She participates in exercise intermittently. Her home blood glucose trend is fluctuating minimally. Her breakfast blood glucose range is generally 110-130 mg/dl. Her bedtime blood glucose range is generally 90-110 mg/dl. (She presents today with her meter and logs showing at goal glycemic profile overall, with tight postprandial readings.  Her POCT A1c today is 7.6%, improving from last visit  of 9.6%.  She denies any significant hypoglycemia.) An ACE inhibitor/angiotensin II receptor blocker is not being taken. She sees a podiatrist.Eye exam is current.  Hyperlipidemia This is a chronic problem. The current episode started more than 1 year ago. The problem is controlled. Recent lipid tests were reviewed and are normal. Exacerbating diseases include chronic renal disease, diabetes and obesity. Factors aggravating her hyperlipidemia include beta blockers. Pertinent negatives include no chest pain, myalgias or shortness of breath. Current antihyperlipidemic treatment includes statins. The current treatment provides mild improvement of lipids. There are no compliance problems.  Risk factors for coronary artery disease include diabetes mellitus, dyslipidemia, obesity, hypertension, a sedentary lifestyle and post-menopausal.  Hypertension This is a chronic problem. The current episode started more than 1 year ago. The problem has been resolved since onset. The problem is controlled. Pertinent negatives include no chest pain, headaches, palpitations or shortness of breath. There are no associated agents to hypertension. Risk factors for coronary artery disease include sedentary lifestyle, obesity, post-menopausal state, diabetes mellitus and dyslipidemia. Past treatments include calcium channel blockers, beta blockers and diuretics. The current treatment provides mild improvement. There are no compliance problems.  Hypertensive end-organ damage includes kidney disease and CAD/MI. Identifiable causes of hypertension include chronic renal disease.    Review of systems  Constitutional: + Minimally fluctuating body weight,  current Body mass index is 33.08 kg/m. , no fatigue, no subjective hyperthermia, no subjective hypothermia Eyes: no blurry vision, no xerophthalmia ENT: no sore throat, no nodules palpated in throat, no dysphagia/odynophagia, no hoarseness Cardiovascular: no chest pain, no shortness  of breath, no palpitations, no leg swelling Respiratory: no cough, no shortness of breath Gastrointestinal: no nausea/vomiting/diarrhea Musculoskeletal: no muscle/joint aches Skin: no rashes, no hyperemia Neurological: no tremors, no numbness, no tingling, no dizziness Psychiatric: no depression, no anxiety   Objective:    BP (!) 142/70 (BP Location: Left Arm, Patient Position: Sitting, Cuff Size: Normal) Comment: Retake manuel cuff  Pulse (!) 59   Ht 5\' 7"  (1.702 m)   Wt 211 lb 3.2 oz (95.8 kg)   BMI 33.08 kg/m   Wt Readings from Last 3 Encounters:  01/16/23 211 lb 3.2 oz (95.8 kg)  09/17/22 215 lb (97.5 kg)  08/01/22 209 lb (94.8 kg)    BP Readings from Last 3 Encounters:  01/16/23 (!) 142/70  09/17/22 131/68  08/01/22 120/72     Physical Exam- Limited  Constitutional:  Body mass index is 33.08 kg/m. , not in acute distress, normal state of mind Eyes:  EOMI, no exophthalmos Musculoskeletal: no gross deformities, strength intact in all four extremities, no gross restriction of joint movements Skin:  no rashes, no hyperemia Neurological: no tremor with outstretched hands    CMP     Component Value Date/Time   NA 145 (H) 01/09/2023 0835   K 4.0 01/09/2023 0835   CL 106 01/09/2023 0835   CO2 23 01/09/2023 0835   GLUCOSE 133 (H) 01/09/2023 0835   GLUCOSE 336 (H) 08/08/2022 1040   BUN 26 01/09/2023 0835   CREATININE 1.11 (H) 01/09/2023 0835   CREATININE 1.14 (H) 12/15/2019 1048   CALCIUM 10.0 01/09/2023 0835   PROT 7.2 01/09/2023 0835   ALBUMIN 4.4 01/09/2023 0835   AST 19 01/09/2023 0835   ALT 20 01/09/2023 0835   ALKPHOS 119 01/09/2023 0835   BILITOT 0.4 01/09/2023 0835   GFRNONAA 43 (L) 08/08/2022 1040   GFRNONAA 45 (L) 12/15/2019 1048   GFRAA 48 (L) 09/05/2020 0810   GFRAA 53 (L) 12/15/2019 1048     Diabetic Labs (most recent): Lab Results  Component Value Date   HGBA1C 7.6 (A) 01/16/2023   HGBA1C 9.6 (A) 09/17/2022   HGBA1C 9.9 05/17/2022    MICROALBUR 1.5 07/24/2019   MICROALBUR 0.7 04/02/2018   MICROALBUR 8.8 (H) 11/27/2017     Lipid Panel ( most recent) Lipid Panel     Component Value Date/Time   CHOL 133 01/09/2023 0835   TRIG 94 01/09/2023 0835   HDL 42 01/09/2023 0835   CHOLHDL 3.2 01/09/2023 0835   CHOLHDL 3.3 12/15/2019 1048   VLDL 21 03/22/2017 0803   LDLCALC 73 01/09/2023 0835   LDLCALC 80 12/15/2019 1048   LABVLDL 18 01/09/2023 0835      Lab Results  Component Value Date   TSH 0.849 09/12/2022   TSH 4.710 (H) 05/14/2022   TSH 4.290 12/25/2021   TSH 8.400 (H) 08/29/2021   TSH 7.640 (H) 04/28/2021   TSH 4.360 05/23/2020   TSH 6.85 (H) 07/24/2019   TSH 5.19 (H) 08/04/2018   TSH 3.81 04/10/2016   TSH 2.410 12/13/2015   FREET4 1.05 09/12/2022   FREET4 0.79 (L) 05/14/2022   FREET4 0.83 12/25/2021   FREET4 0.75 (L) 08/29/2021   FREET4 0.75 (L) 04/28/2021   FREET4 1.0 12/15/2019   FREET4 0.8 07/24/2019   FREET4 0.9 08/04/2018   FREET4 0.78 12/13/2015   FREET4 0.84 11/14/2015       Assessment & Plan:   1) Type 2 diabetes mellitus with stage 2 chronic kidney disease, with long-term current use of insulin ([place])  - Kimberly Freeman has currently uncontrolled symptomatic type 2 DM since  82 years of age. Recent labs reviewed.  She presents today with her meter and logs showing at goal glycemic profile overall, with tight postprandial readings.  Her POCT A1c today is 7.6%, improving from last visit of 9.6%.  She denies any significant hypoglycemia.  - I had a long discussion with her about the progressive nature of diabetes and the pathology behind its complications. -her diabetes is complicated by stage 2 CKD, obesity /sedentary life, and she remains at a high risk for more acute and chronic complications which include CAD, CVA, CKD, retinopathy, and neuropathy. These are all discussed in detail with her.  - Nutritional counseling repeated at each appointment due to patients tendency to fall back in  to old habits.  - The patient  admits there is a room for improvement in their diet and drink choices. -  Suggestion is made for the patient to avoid simple carbohydrates from their diet including Cakes, Sweet Desserts / Pastries, Ice Cream, Soda (diet and regular), Sweet Tea, Candies, Chips, Cookies, Sweet Pastries, Store Bought Juices, Alcohol in Excess of 1-2 drinks a day, Artificial Sweeteners, Coffee Creamer, and "Sugar-free" Products. This will help patient to have stable blood glucose profile and potentially avoid unintended weight gain.   - I encouraged the patient to switch to unprocessed or minimally processed complex starch and increased protein intake (animal or plant source), fruits, and vegetables.   - Patient is advised to stick to a routine mealtimes to eat 3 meals a day and avoid unnecessary snacks (to snack only to correct hypoglycemia).  - I have approached her with the following individualized plan to manage  her diabetes and patient agrees:   - she will continue to need at least basal insulin in order for her to achieve control of diabetes to target.    -She is advised to lower her Tresiba  to 28 units SQ nightly and continue Glipizide 2.5 mg XL daily with breakfast.    -She is encouraged to continue monitoring glucose twice daily (consistently), before breakfast and before bed, and to call the clinic if she has readings less than 70 or greater than 300 for 3 tests in a row.  -She will be considered for incretin therapy during her next visit.  - Specific targets for  A1c;  LDL, HDL,  and Triglycerides were discussed with the patient.  2) Blood Pressure /Hypertension:  Her blood pressure is controlled to target for her age.  She is advised to continue Norvasc 10 mg po daily, Irbesartan 300 mg po daily, Metoprolol 100 mg po twice daily, and Aldactone 50 mg po daily.  3) Lipids/Hyperlipidemia:    Her most recent lipid panel from 01/09/23 shows controlled LDL at 73.  She is  advised to continue Atorvastatin 80 mg po daily at bedtime.  Side effects and precautions discussed with her.  4)  Weight/Diet:  Her Body mass index is 33.08 kg/m.  -   clearly complicating her diabetes care.   she is  a candidate for weight loss. I discussed with her the fact that loss of 5 - 10% of her  current body weight will have the most impact on her diabetes management.  Exercise, and detailed carbohydrates information provided  -  detailed on discharge instructions.  5) Hypothyroidism- unspecified There are no recent TFTs to review.  She is advised to continue Levothyroxine 50 mcg po daily before breakfast.    - The correct intake of thyroid hormone (Levothyroxine, Synthroid), is on empty stomach first thing in the morning, with water, separated by at least 30 minutes from breakfast and other medications,  and separated by more than 4 hours from calcium, iron, multivitamins, acid reflux medications (PPIs).  - This medication is a life-long medication and will be needed to correct thyroid hormone imbalances for the rest of your life.  The dose may change from time to time, based on thyroid blood work.  - It is extremely important to be consistent taking this medication, near the same time each morning.  -AVOID TAKING PRODUCTS CONTAINING BIOTIN (commonly found in Hair, Skin, Nails vitamins) AS IT INTERFERES WITH THE VALIDITY OF THYROID FUNCTION BLOOD TESTS.  6) Chronic Care/Health Maintenance: -she is on Statin medications and  is encouraged to initiate and continue  to follow up with Ophthalmology, Dentist,  Podiatrist at least yearly or according to recommendations, and advised to stay away from smoking. I have recommended yearly flu vaccine and pneumonia vaccine at least every 5 years; moderate intensity exercise for up to 150 minutes weekly; and  sleep for at least 7 hours a day.  - she is advised to maintain close follow up with Fayrene Helper, MD for primary care needs, as well  as her other providers for optimal and coordinated care.      I spent  30  minutes in the care of the patient today including review of labs from [facility], Lipids, Thyroid Function, Hematology (current and previous including abstractions from other facilities); face-to-face time discussing  her blood glucose readings/logs, discussing hypoglycemia and hyperglycemia episodes and symptoms, medications doses, her options of short and long term treatment based on the latest standards of care / guidelines;  discussion about incorporating lifestyle medicine;  and documenting the encounter. Risk reduction counseling performed per USPSTF guidelines to reduce obesity and cardiovascular risk factors.     Please refer to Patient Instructions for Blood Glucose Monitoring and Insulin/Medications Dosing Guide"  in media tab for additional information. Please  also refer to " Patient Self Inventory" in the Media  tab for reviewed elements of pertinent patient history.  Kimberly Freeman participated in the discussions, expressed understanding, and voiced agreement with the above plans.  All questions were answered to her satisfaction. she is encouraged to contact clinic should she have any questions or concerns prior to her return visit.   Follow up plan: - Return in about 4 months (around 05/18/2023) for Diabetes F/U with A1c in office, Thyroid follow up, Previsit labs, Bring meter and logs.  Rayetta Pigg, [facility] [address] [place], [place] [address] Phone: [phone] Fax: [phone]  01/16/2023, 10:28 AM

## 2023-01-22 DIAGNOSIS — E1142 Type 2 diabetes mellitus with diabetic polyneuropathy: Secondary | ICD-10-CM | POA: Diagnosis not present

## 2023-01-22 DIAGNOSIS — B351 Tinea unguium: Secondary | ICD-10-CM | POA: Diagnosis not present

## 2023-02-01 ENCOUNTER — Inpatient Hospital Stay: Payer: Medicare Other | Attending: Hematology

## 2023-02-01 ENCOUNTER — Ambulatory Visit: Payer: Medicare Other | Admitting: Family Medicine

## 2023-02-01 ENCOUNTER — Encounter: Payer: Self-pay | Admitting: Family Medicine

## 2023-02-01 VITALS — BP 148/76 | HR 66 | Resp 16 | Ht 67.0 in | Wt 211.8 lb

## 2023-02-01 DIAGNOSIS — D631 Anemia in chronic kidney disease: Secondary | ICD-10-CM | POA: Diagnosis not present

## 2023-02-01 DIAGNOSIS — N1831 Chronic kidney disease, stage 3a: Secondary | ICD-10-CM | POA: Diagnosis not present

## 2023-02-01 DIAGNOSIS — I480 Paroxysmal atrial fibrillation: Secondary | ICD-10-CM

## 2023-02-01 DIAGNOSIS — Z79899 Other long term (current) drug therapy: Secondary | ICD-10-CM | POA: Insufficient documentation

## 2023-02-01 DIAGNOSIS — L309 Dermatitis, unspecified: Secondary | ICD-10-CM | POA: Diagnosis not present

## 2023-02-01 DIAGNOSIS — D649 Anemia, unspecified: Secondary | ICD-10-CM

## 2023-02-01 DIAGNOSIS — I10 Essential (primary) hypertension: Secondary | ICD-10-CM

## 2023-02-01 DIAGNOSIS — E039 Hypothyroidism, unspecified: Secondary | ICD-10-CM

## 2023-02-01 DIAGNOSIS — Z794 Long term (current) use of insulin: Secondary | ICD-10-CM | POA: Diagnosis not present

## 2023-02-01 DIAGNOSIS — E1165 Type 2 diabetes mellitus with hyperglycemia: Secondary | ICD-10-CM | POA: Diagnosis not present

## 2023-02-01 LAB — CBC WITH DIFFERENTIAL/PLATELET
Abs Immature Granulocytes: 0.01 10*3/uL (ref 0.00–0.07)
Basophils Absolute: 0 10*3/uL (ref 0.0–0.1)
Basophils Relative: 1 %
Eosinophils Absolute: 0.2 10*3/uL (ref 0.0–0.5)
Eosinophils Relative: 2 %
HCT: 35.9 % — ABNORMAL LOW (ref 36.0–46.0)
Hemoglobin: 11.1 g/dL — ABNORMAL LOW (ref 12.0–15.0)
Immature Granulocytes: 0 %
Lymphocytes Relative: 30 %
Lymphs Abs: 2.4 10*3/uL (ref 0.7–4.0)
MCH: 30 pg (ref 26.0–34.0)
MCHC: 30.9 g/dL (ref 30.0–36.0)
MCV: 97 fL (ref 80.0–100.0)
Monocytes Absolute: 0.8 10*3/uL (ref 0.1–1.0)
Monocytes Relative: 11 %
Neutro Abs: 4.5 10*3/uL (ref 1.7–7.7)
Neutrophils Relative %: 56 %
Platelets: 191 10*3/uL (ref 150–400)
RBC: 3.7 MIL/uL — ABNORMAL LOW (ref 3.87–5.11)
RDW: 13.2 % (ref 11.5–15.5)
WBC: 7.9 10*3/uL (ref 4.0–10.5)
nRBC: 0 % (ref 0.0–0.2)

## 2023-02-01 LAB — COMPREHENSIVE METABOLIC PANEL
ALT: 29 U/L (ref 0–44)
AST: 23 U/L (ref 15–41)
Albumin: 3.9 g/dL (ref 3.5–5.0)
Alkaline Phosphatase: 94 U/L (ref 38–126)
Anion gap: 7 (ref 5–15)
BUN: 24 mg/dL — ABNORMAL HIGH (ref 8–23)
CO2: 28 mmol/L (ref 22–32)
Calcium: 9.6 mg/dL (ref 8.9–10.3)
Chloride: 106 mmol/L (ref 98–111)
Creatinine, Ser: 1.09 mg/dL — ABNORMAL HIGH (ref 0.44–1.00)
GFR, Estimated: 50 mL/min — ABNORMAL LOW (ref >60)
Glucose, Bld: 128 mg/dL — ABNORMAL HIGH (ref 70–99)
Potassium: 4.3 mmol/L (ref 3.5–5.1)
Sodium: 141 mmol/L (ref 135–145)
Total Bilirubin: 0.7 mg/dL (ref 0.3–1.2)
Total Protein: 7.3 g/dL (ref 6.5–8.1)

## 2023-02-01 LAB — IRON AND TIBC
Iron: 48 ug/dL (ref 28–170)
Saturation Ratios: 15 % (ref 10.4–31.8)
TIBC: 326 ug/dL (ref 250–450)
UIBC: 278 ug/dL

## 2023-02-01 LAB — FERRITIN: Ferritin: 292 ng/mL (ref 11–307)

## 2023-02-01 LAB — VITAMIN B12: Vitamin B-12: 920 pg/mL — ABNORMAL HIGH (ref 180–914)

## 2023-02-01 LAB — FOLATE: Folate: 31 ng/mL (ref >5.9)

## 2023-02-01 MED ORDER — SPIRONOLACTONE 50 MG PO TABS: 50.0000 mg | ORAL_TABLET | Freq: Two times a day (BID) | ORAL | 2 refills | Status: AC

## 2023-02-01 MED ORDER — BETAMETHASONE DIPROPIONATE 0.05 % EX CREA: TOPICAL_CREAM | Freq: Two times a day (BID) | CUTANEOUS | 1 refills | Status: AC

## 2023-02-01 NOTE — Patient Instructions (Signed)
F/u re evaluate blood pressure 7 weeks, and foot exam call if you need me sooner  INCREASE spironolactone 50 mg to one twice dialy, BP still above goal   CONGRATS on excellent blood sugar and cholesterol  Chem7 and EGFR, non fasting 3 to5 days before follow   Need civid  vaccine, shingrix vaccines abnd TdAP all are at your pharmacy   I will message Hematology with the question regarding need follow   Thanks for choosing [facility], we consider it a privelige to serve you. up

## 2023-02-04 ENCOUNTER — Encounter: Payer: Self-pay | Admitting: Family Medicine

## 2023-02-04 NOTE — Assessment & Plan Note (Signed)
Current flare, steroid refilled for sparing, as needed, use

## 2023-02-04 NOTE — Progress Notes (Signed)
Kimberly Freeman     MRN: [id number]      DOB: [birth year]   HPI [name] is here for follow up and re-evaluation of chronic medical conditions, medication management and review of any available recent lab and radiology data.  Preventive health is updated, specifically  Cancer screening and Immunization.   Questions or concerns regarding consultations or procedures which the PT has had in the interim are  addressed. The PT denies any adverse reactions to current medications since the last visit.  Itchy rash on palms , intermittent , needs med refilled, current flare in past 5 days Denies polyuria, polydipsia, blurred vision , or hypoglycemic episodes. Improved blood sugar control  Wants out of Hematology follow up  if feasible or mdically indicared/  ROS Denies recent fever or chills. Denies sinus pressure, nasal congestion, ear pain or sore throat. Denies chest congestion, productive cough or wheezing. Denies chest pains, palpitations and leg swelling Denies abdominal pain, nausea, vomiting,diarrhea or constipation.   Denies dysuria, frequency, hesitancy or incontinence. Denies joint pain, swelling and limitation in mobility. Denies headaches, seizures, numbness, or tingling. Denies depression, anxiety or insomnia.  PE  BP (!) 148/76   Pulse 66   Resp 16   Ht 5\' 7"  (1.702 m)   Wt 211 lb 12.8 oz (96.1 kg)   SpO2 93%   BMI 33.17 kg/m   Patient alert and oriented and in no cardiopulmonary distress.  HEENT: No facial asymmetry, EOMI,     Neck supple .  Chest: Clear to auscultation bilaterally.  CVS: S1, S2 no murmurs, no S3.Regular rate.  ABD: Soft non tender.   Ext: No edema  MS: Adequate ROM spine, shoulders, hips and knees.  Skin: Intact, hyperpigmented papular rash on palms Psych: Good eye contact, normal affect. Memory intact not anxious or depressed appearing.  CNS: CN 2-12 intact, power,  normal throughout.no focal deficits noted.   Assessment &  Plan  Essential hypertension Uncontrolled , increase spironolactone dose, and re eval,  DASH diet and commitment to daily physical activity for a minimum of 30 minutes discussed and encouraged, as a part of hypertension management. The importance of attaining a healthy weight is also discussed.     02/01/2023   10:16 AM 01/16/2023    9:37 AM 01/16/2023    9:30 AM 09/17/2022   10:05 AM 08/01/2022   10:27 AM 08/01/2022   10:02 AM 06/20/2022    9:25 AM  BP/Weight  Systolic BP 148 142 158 131 120 132 138  Diastolic BP 76 70 74 68 72 72 62  Wt. (Lbs) 211.8  211.2 215  209   BMI 33.17 kg/m2  33.08 kg/m2 33.67 kg/m2  32.73 kg/m2        Paroxysmal atrial fibrillation (HCC) Rate controlled and not maintained on anticoagullant  Uncontrolled type 2 diabetes mellitus with hyperglycemia, with long-term current use of insulin (HCC) [name] is reminded of the importance of commitment to daily physical activity for 30 minutes or more, as able and the need to limit carbohydrate intake to 30 to 60 grams per meal to help with blood sugar control.   The need to take medication as prescribed, test blood sugar as directed, and to call between visits if there is a concern that blood sugar is uncontrolled is also discussed.   [name] is reminded of the importance of daily foot exam, annual eye examination, and good blood sugar, blood pressure and cholesterol control. Improved, managed by Endo  Latest Ref Rng & Units 02/01/2023   12:59 PM 01/16/2023    9:49 AM 01/09/2023    8:35 AM 09/17/2022   10:18 AM 08/13/2022   10:11 AM  Diabetic Labs  HbA1c 4.0 - 5.6 %  7.6   9.6    Micro/Creat Ratio 0 - 29 mg/g creat     4   Chol 100 - 199 mg/dL   161   096   HDL >04 mg/dL   42   39   Calc LDL 0 - 99 mg/dL   73   82   Triglycerides 0 - 149 mg/dL   94   540   Creatinine 0.44 - 1.00 mg/dL 9.81   1.91   4.78       02/01/2023   10:16 AM 01/16/2023    9:37 AM 01/16/2023    9:30 AM 09/17/2022    10:05 AM 08/01/2022   10:27 AM 08/01/2022   10:02 AM 06/20/2022    9:25 AM  BP/Weight  Systolic BP 148 142 158 131 120 132 138  Diastolic BP 76 70 74 68 72 72 62  Wt. (Lbs) 211.8  211.2 215  209   BMI 33.17 kg/m2  33.08 kg/m2 33.67 kg/m2  32.73 kg/m2       Latest Ref Rng & Units 12/27/2021    9:40 AM 03/01/2020   12:00 AM  Foot/eye exam completion dates  Eye Exam No Retinopathy  No Retinopathy      Foot Form Completion  Done      This result is from an external source.        Hypothyroid Controlled, no change in medication   Eczema Current flare, steroid refilled for sparing, as needed, use  Anemia Being followed by Hematology will see if able to continue f/u with Primary care unless there is major change, will send msg, pt request

## 2023-02-04 NOTE — Assessment & Plan Note (Signed)
Uncontrolled , increase spironolactone dose, and re eval,  DASH diet and commitment to daily physical activity for a minimum of 30 minutes discussed and encouraged, as a part of hypertension management. The importance of attaining a healthy weight is also discussed.     02/01/2023   10:16 AM 01/16/2023    9:37 AM 01/16/2023    9:30 AM 09/17/2022   10:05 AM 08/01/2022   10:27 AM 08/01/2022   10:02 AM 06/20/2022    9:25 AM  BP/Weight  Systolic BP 148 142 158 131 120 132 138  Diastolic BP 76 70 74 68 72 72 62  Wt. (Lbs) 211.8  211.2 215  209   BMI 33.17 kg/m2  33.08 kg/m2 33.67 kg/m2  32.73 kg/m2

## 2023-02-04 NOTE — Assessment & Plan Note (Signed)
Being followed by Hematology will see if able to continue f/u with Primary care unless there is major change, will send msg, pt request

## 2023-02-04 NOTE — Assessment & Plan Note (Signed)
Controlled, no change in medication  

## 2023-02-04 NOTE — Assessment & Plan Note (Signed)
Rate controlled and not maintained on anticoagullant

## 2023-02-04 NOTE — Assessment & Plan Note (Signed)
Kimberly Freeman is reminded of the importance of commitment to daily physical activity for 30 minutes or more, as able and the need to limit carbohydrate intake to 30 to 60 grams per meal to help with blood sugar control.   The need to take medication as prescribed, test blood sugar as directed, and to call between visits if there is a concern that blood sugar is uncontrolled is also discussed.   Kimberly Freeman is reminded of the importance of daily foot exam, annual eye examination, and good blood sugar, blood pressure and cholesterol control. Improved, managed by Endo     Latest Ref Rng & Units 02/01/2023   12:59 PM 01/16/2023    9:49 AM 01/09/2023    8:35 AM 09/17/2022   10:18 AM 08/13/2022   10:11 AM  Diabetic Labs  HbA1c 4.0 - 5.6 %  7.6   9.6    Micro/Creat Ratio 0 - 29 mg/g creat     4   Chol 100 - 199 mg/dL   414   239   HDL >53 mg/dL   42   39   Calc LDL 0 - 99 mg/dL   73   82   Triglycerides 0 - 149 mg/dL   94   202   Creatinine 0.44 - 1.00 mg/dL 3.34   3.56   8.61       02/01/2023   10:16 AM 01/16/2023    9:37 AM 01/16/2023    9:30 AM 09/17/2022   10:05 AM 08/01/2022   10:27 AM 08/01/2022   10:02 AM 06/20/2022    9:25 AM  BP/Weight  Systolic BP 148 142 158 131 120 132 138  Diastolic BP 76 70 74 68 72 72 62  Wt. (Lbs) 211.8  211.2 215  209   BMI 33.17 kg/m2  33.08 kg/m2 33.67 kg/m2  32.73 kg/m2       Latest Ref Rng & Units 12/27/2021    9:40 AM 03/01/2020   12:00 AM  Foot/eye exam completion dates  Eye Exam No Retinopathy  No Retinopathy      Foot Form Completion  Done      This result is from an external source.

## 2023-02-06 ENCOUNTER — Other Ambulatory Visit: Payer: Medicare Other

## 2023-02-08 LAB — METHYLMALONIC ACID, SERUM: Methylmalonic Acid, Quantitative: 191 nmol/L (ref 0–378)

## 2023-02-12 NOTE — Progress Notes (Unsigned)
[facility] [address], [place] [address]   CLINIC:  Medical Oncology/Hematology  PCP:  [name], MD [address] [place] [address] [phone]   REASON FOR VISIT:  Follow-up for anemia of iron deficiency and CKD   CURRENT THERAPY: Observation  INTERVAL HISTORY:   [name] 82 y.o. female returns for routine follow-up of anemia secondary to CKD stage III and iron deficiency.  She was last evaluated via telemedicine visit by Rojelio Brenner PA-C on 08/15/2022.  At today's visit, she reports feeling well.  No recent hospitalizations, surgeries, or changes in baseline health status.  She does not take oral iron or B12 supplements at home.  She has not noted any major blood loss such as hematemesis, hematochezia, or melena.  She has chronic dyspnea on exertion which is at baseline.  She has some mild intermittent fatigue which varies day today ever since she started her thyroid medication.  She denies any pica, restless legs, chest pain, dyspnea exertion, lightheadedness, or syncope.  She has no specific complaints at the time of today's visit.   She has 80% energy and 70% appetite. She endorses that she is maintaining a stable weight.   ASSESSMENT & PLAN:  1.  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 pylori - Colonoscopy (07/18/2017): Internal and external hemorrhoids - SPEP and IFE in 2018 were negative for monoclonal gammopathy - No major bleeding events such as hematemesis, hematochezia, or melena - Energy is at baseline.  No pica. - Most recent labs (02/01/2023): Hgb 11.1/MCV 97.0.   Ferritin 292, iron saturation 15%. Vitamin B12 920, MMA 191.  Normal folate. - Anemia is primarily secondary to CKD stage III, but hemoglobin remains at goal.  If Hgb drops to  9.0-10.0 despite adequate iron, would consider starting patient on ESA. - PLAN: Repeat labs (CBC, CMP, iron panel, B12/MMA, folate) in 1 year with office visit 1 week after.     2.  Chronic kidney disease, stage IIIa/b - CMP (02/01/2023) shows creatinine 1.09/GFR 50, in keeping with her CKD stage IIIa/b. - Kidney function appears to be at her baseline CKD - PLAN: Continue to encourage adequate water intake and avoidance of NSAIDs.  Patient can discuss with PCP regarding whether or not she needs referral to nephrologist.   3.  History of elevated LFTs  - LFTs were noted to be minimally elevated at her visit in November 2021 - Appears to be self-limited and has resolved without intervention  PLAN SUMMARY: >> Labs in 1 year = CBC/D, CMP, iron panel, B12/MMA, folate >> OFFICE visit in 1 year (1 week after labs)      REVIEW OF SYSTEMS:   Review of Systems  Constitutional:  Negative for appetite change, chills, diaphoresis, fatigue, fever and unexpected weight change.  HENT:   Negative for lump/mass and nosebleeds.   Eyes:  Negative for eye problems.  Respiratory:  Negative for cough, hemoptysis and shortness of breath.   Cardiovascular:  Negative for chest pain, leg swelling and palpitations.  Gastrointestinal:  Negative for abdominal pain, blood in stool, constipation, diarrhea, nausea and vomiting.  Genitourinary:  Negative for hematuria.   Skin: Negative.   Neurological:  Negative for dizziness, headaches and light-headedness.  Hematological:  Does not bruise/bleed easily.     PHYSICAL EXAM:  ECOG PERFORMANCE STATUS: 0 - Asymptomatic  There were no vitals filed for this visit. There were no vitals filed for this visit. Physical Exam Constitutional:      Appearance: Normal appearance. She is obese.  Cardiovascular:     Heart sounds: Murmur heard.  Pulmonary:     Breath sounds: Normal breath sounds.  Neurological:     General: No focal deficit present.     Mental Status:  Mental status is at baseline.  Psychiatric:        Behavior: Behavior normal. Behavior is cooperative.     PAST MEDICAL/SURGICAL HISTORY:  Past Medical History:  Diagnosis Date   Arthritis    CAD (coronary artery disease) 08/2019   60% RI with other 20-30% lesions at cath   Diabetes mellitus, type 2    Essential hypertension    GERD (gastroesophageal reflux disease)    Hyperlipidemia    LVH (left ventricular hypertrophy)    Past Surgical History:  Procedure Laterality Date   ABDOMINAL HYSTERECTOMY     BIOPSY  07/18/2017   Procedure: BIOPSY;  Surgeon: [name], MD;  Location: [facility];  Service: Endoscopy;;  gastric   CHOLECYSTECTOMY  2004   COLONOSCOPY N/A 07/18/2017   Dr. [name]: Internal and external hemorrhoids.  No future screening/surveillance colonoscopies due to age.   ESOPHAGOGASTRODUODENOSCOPY N/A 07/18/2017   Dr. [name]: Small hiatal hernia, patchy mild inflammation characterized by congestion, erosions, erythema in the cardia, gastric body and antrum.  Diffuse moderate inflammation in the duodenal bulb and second portion duodenum.  Biopsies benign.  No H. pylori.   LEFT HEART CATH AND CORONARY ANGIOGRAPHY N/A 09/21/2019   Procedure: LEFT HEART CATH AND CORONARY ANGIOGRAPHY;  Surgeon: [name], MD;  Location: [facility] IN[facility]ASIVE [facility];  Service: Cardiovascular;  Laterality: N/A;    SOCIAL HISTORY:  Social History   Socioeconomic History   Marital status: Married    Spouse name: [name]    Number of children: 0   Years of education: 12+   Highest education level: 12th grade  Occupational History   Occupation: retired   Tobacco Use   Smoking status: Never   Smokeless tobacco: Never  Vaping Use   Vaping Use: Never used  Substance and Sexual Activity   Alcohol use: No    Alcohol/week: 0.0 standard drinks of alcohol   Drug use: No   Sexual activity: Not on file  Other Topics Concern   Not on file  Social History Narrative   Lives alone with  husband    Social Determinants of Health   Financial Resource Strain: Low Risk  (09/30/2021)   Overall Financial Resource Strain (CARDIA)    Difficulty of Paying Living Expenses: Not hard at all  Food Insecurity: No Food Insecurity (09/30/2021)   Hunger Vital Sign    Worried About Running Out of Food in the Last Year: Never true    Ran Out of Food in the Last Year: Never true  Transportation Needs: No Transportation Needs (09/30/2021)   PRAPARE - Administrator, Civil Service (Medical): No    Lack of Transportation (Non-Medical): No  Physical Activity: Insufficiently Active (09/30/2021)   Exercise Vital Sign    Days of Exercise per Week: 3 days    Minutes of Exercise per Session: 30 min  Stress: No Stress Concern Present (09/30/2021)   [employer] of Occupational Health - Occupational Stress Questionnaire    Feeling of Stress : Not at all  Social Connections: Socially  Integrated (09/30/2021)   Social Connection and Isolation Panel [NHANES]    Frequency of Communication with Friends and Family: More than three times a week    Frequency of Social Gatherings with Friends and Family: More than three times a week    Attends Religious Services: More than 4 times per year    Active Member of [employer] or Organizations: Yes    Attends Engineer, structural: More than 4 times per year    Marital Status: Married  Catering manager Violence: Not At Risk (09/30/2021)   Humiliation, Afraid, Rape, and Kick questionnaire    Fear of Current or Ex-Partner: No    Emotionally Abused: No    Physically Abused: No    Sexually Abused: No    FAMILY HISTORY:  Family History  Problem Relation Age of Onset   Leukemia Mother    Cancer Mother    Diabetes Mother    Prostate cancer Father    Lung cancer Sister 36   Diabetes Sister    Colon cancer Neg Hx     CURRENT MEDICATIONS:  Outpatient Encounter Medications as of 02/13/2023  Medication Sig   acetaminophen (TYLENOL) 500 MG  tablet Take 500 mg by mouth daily as needed for mild pain or moderate pain. For pain   amLODipine (NORVASC) 10 MG tablet TAKE 1 TABLET DAILY (DOSE INCREASE)   aspirin 81 MG chewable tablet Chew 1 tablet (81 mg total) by mouth daily.   atorvastatin (LIPITOR) 80 MG tablet TAKE 1 TABLET DAILY AT 6 P.M.   BD PEN NEEDLE MICRO U/F 32G X 6 MM MISC USE TO INJECT INSULIN DAILY   betamethasone dipropionate 0.05 % cream Apply topically 2 (two) times daily.   calcium-vitamin D (OSCAL WITH D) 500-200 MG-UNIT tablet Take 1 tablet by mouth.   Easy Touch Lancets 28G MISC USE TO TEST TWICE A DAY   glipiZIDE (GLUCOTROL XL) 2.5 MG 24 hr tablet TAKE 1 TABLET DAILY WITH BREAKFAST   insulin degludec (TRESIBA FLEXTOUCH) 100 UNIT/ML FlexTouch Pen Inject 28 Units into the skin at bedtime.   irbesartan (AVAPRO) 300 MG tablet TAKE 1 TABLET DAILY   Lancets (ONETOUCH ULTRASOFT) lancets USE AS DIRECTED TWICE DAILY   levothyroxine (SYNTHROID) 50 MCG tablet TAKE 1 TABLET DAILY BEFORE BREAKFAST   metoprolol tartrate (LOPRESSOR) 100 MG tablet TAKE 1 TABLET TWICE A DAY   Multiple Vitamin (MULTIVITAMIN WITH MINERALS) TABS tablet Take 1 tablet by mouth daily.   ONETOUCH ULTRA test strip USE AS INSTRUCTED FOR TWICE A DAY TESTING   pantoprazole (PROTONIX) 40 MG tablet Take 40 mg by mouth daily.   polyethylene glycol (MIRALAX / GLYCOLAX) 17 g packet Take 17 g by mouth daily as needed.   spironolactone (ALDACTONE) 50 MG tablet Take 1 tablet (50 mg total) by mouth 2 (two) times daily.   No facility-administered encounter medications on file as of 02/13/2023.    ALLERGIES:  Allergies  Allergen Reactions   Poison Oak Extract [Poison Oak Extract] Hives   Sulfonamide Derivatives Itching    Burning sensation all over body   Levemir [Insulin Detemir] Rash    LABORATORY DATA:  I have reviewed the labs as listed.  CBC    Component Value Date/Time   WBC 7.9 02/01/2023 1259   RBC 3.70 (L) 02/01/2023 1259   HGB 11.1 (L) 02/01/2023  1259   HGB 11.7 02/19/2022 1038   HCT 35.9 (L) 02/01/2023 1259   HCT 35.5 02/19/2022 1038   PLT 191 02/01/2023 1259  PLT 213 02/19/2022 1038   MCV 97.0 02/01/2023 1259   MCV 90 02/19/2022 1038   MCH 30.0 02/01/2023 1259   MCHC 30.9 02/01/2023 1259   RDW 13.2 02/01/2023 1259   RDW 12.2 02/19/2022 1038   LYMPHSABS 2.4 02/01/2023 1259   MONOABS 0.8 02/01/2023 1259   EOSABS 0.2 02/01/2023 1259   BASOSABS 0.0 02/01/2023 1259      Latest Ref Rng & Units 02/01/2023   12:59 PM 01/09/2023    8:35 AM 08/13/2022   10:11 AM  CMP  Glucose 70 - 99 mg/dL 782  956  213   BUN 8 - 23 mg/dL Creatinine 0.44 - 1.00 mg/dL 0.86  5.78  4.69   Sodium 135 - 145 mmol/L 141  145  143   Potassium 3.5 - 5.1 mmol/L 4.3  4.0  4.4   Chloride 98 - 111 mmol/L 106  106  104   CO2 22 - 32 mmol/L Calcium 8.9 - 10.3 mg/dL 9.6  62.9  52.8   Total Protein 6.5 - 8.1 g/dL 7.3  7.2  7.0   Total Bilirubin 0.3 - 1.2 mg/dL 0.7  0.4  0.3   Alkaline Phos 38 - 126 U/L 94  119  118   AST 15 - 41 U/L ALT 0 - 44 U/L 29  20  32     DIAGNOSTIC IMAGING:  I have independently reviewed the relevant imaging and discussed with the patient.   WRAP UP:  All questions were answered. The patient knows to call the clinic with any problems, questions or concerns.  Medical decision making: Low  Time spent on visit: I spent 15 minutes counseling the patient face to face. The total time spent in the appointment was 22 minutes and more than 50% was on counseling.  Carnella Guadalajara, PA-C  02/13/23 9:00 AM

## 2023-02-13 ENCOUNTER — Ambulatory Visit: Payer: Medicare Other | Admitting: Physician Assistant

## 2023-02-13 ENCOUNTER — Other Ambulatory Visit: Payer: Self-pay

## 2023-02-13 ENCOUNTER — Inpatient Hospital Stay (HOSPITAL_BASED_OUTPATIENT_CLINIC_OR_DEPARTMENT_OTHER): Payer: Medicare Other | Admitting: Physician Assistant

## 2023-02-13 VITALS — BP 146/56 | HR 60 | Temp 98.0°F | Resp 18 | Wt 211.2 lb

## 2023-02-13 DIAGNOSIS — D631 Anemia in chronic kidney disease: Secondary | ICD-10-CM

## 2023-02-13 DIAGNOSIS — Z79899 Other long term (current) drug therapy: Secondary | ICD-10-CM | POA: Diagnosis not present

## 2023-02-13 DIAGNOSIS — R7989 Other specified abnormal findings of blood chemistry: Secondary | ICD-10-CM

## 2023-02-13 DIAGNOSIS — R748 Abnormal levels of other serum enzymes: Secondary | ICD-10-CM

## 2023-02-13 DIAGNOSIS — N1831 Chronic kidney disease, stage 3a: Secondary | ICD-10-CM | POA: Diagnosis not present

## 2023-02-13 DIAGNOSIS — D649 Anemia, unspecified: Secondary | ICD-10-CM

## 2023-02-13 DIAGNOSIS — N183 Chronic kidney disease, stage 3 unspecified: Secondary | ICD-10-CM

## 2023-02-13 NOTE — Patient Instructions (Signed)
[facility] at [facility] Discharge Instructions  You were seen today by Rojelio Brenner PA-C for your anemia.  As we have discussed your low blood count ("anemia") related to your chronic kidney disease.  Your hemoglobin remains mildly low, but within your normal baseline range.  You do not need any treatment for that at this time, we will continue to monitor periodically.  FOLLOW-UP APPOINTMENT: Labs and office visit in 1 year   Thank you for choosing [facility] at [facility] to provide your oncology and hematology care.  To afford each patient quality time with our provider, please arrive at least 15 minutes before your scheduled appointment time.   If you have a lab appointment with the Cancer Center please come in thru the Main Entrance and check in at the main information desk.  You need to re-schedule your appointment should you arrive 10 or more minutes late.  We strive to give you quality time with our providers, and arriving late affects you and other patients whose appointments are after yours.  Also, if you no show three or more times for appointments you may be dismissed from the clinic at the providers discretion.     Again, thank you for choosing [facility].  Our hope is that these requests will decrease the amount of time that you wait before being seen by our physicians.       _____________________________________________________________  Should you have questions after your visit to [facility], please contact our office at [phone] and follow the prompts.  Our office hours are 8:00 a.m. and 4:30 p.m. Monday - Friday.  Please note that voicemails left after 4:00 p.m. may not be returned until the following business day.  We are closed weekends and major holidays.  You do have access to a nurse 24-7, just call the main number to the clinic [phone] and do not press any options, hold on the line and  a nurse will answer the phone.    For prescription refill requests, have your pharmacy contact our office and allow 72 hours.    Due to Covid, you will need to wear a mask upon entering the hospital. If you do not have a mask, a mask will be given to you at the Main Entrance upon arrival. For doctor visits, patients may have 1 support person age 83 or older with them. For treatment visits, patients can not have anyone with them due to social distancing guidelines and our immunocompromised population.

## 2023-02-18 ENCOUNTER — Encounter: Payer: Self-pay | Admitting: Family Medicine

## 2023-03-19 ENCOUNTER — Other Ambulatory Visit: Payer: Self-pay | Admitting: Nurse Practitioner

## 2023-04-15 DIAGNOSIS — I10 Essential (primary) hypertension: Secondary | ICD-10-CM | POA: Diagnosis not present

## 2023-04-16 ENCOUNTER — Ambulatory Visit: Payer: Medicare Other | Admitting: Family Medicine

## 2023-04-16 ENCOUNTER — Encounter: Payer: Self-pay | Admitting: Family Medicine

## 2023-04-16 VITALS — BP 150/70 | HR 65 | Ht 67.0 in | Wt 209.0 lb

## 2023-04-16 DIAGNOSIS — E039 Hypothyroidism, unspecified: Secondary | ICD-10-CM | POA: Diagnosis not present

## 2023-04-16 DIAGNOSIS — E785 Hyperlipidemia, unspecified: Secondary | ICD-10-CM | POA: Diagnosis not present

## 2023-04-16 DIAGNOSIS — Z794 Long term (current) use of insulin: Secondary | ICD-10-CM

## 2023-04-16 DIAGNOSIS — E559 Vitamin D deficiency, unspecified: Secondary | ICD-10-CM

## 2023-04-16 DIAGNOSIS — F439 Reaction to severe stress, unspecified: Secondary | ICD-10-CM | POA: Diagnosis not present

## 2023-04-16 DIAGNOSIS — I10 Essential (primary) hypertension: Secondary | ICD-10-CM

## 2023-04-16 DIAGNOSIS — N1831 Chronic kidney disease, stage 3a: Secondary | ICD-10-CM

## 2023-04-16 DIAGNOSIS — I480 Paroxysmal atrial fibrillation: Secondary | ICD-10-CM | POA: Diagnosis not present

## 2023-04-16 DIAGNOSIS — Z1231 Encounter for screening mammogram for malignant neoplasm of breast: Secondary | ICD-10-CM

## 2023-04-16 DIAGNOSIS — E1165 Type 2 diabetes mellitus with hyperglycemia: Secondary | ICD-10-CM | POA: Diagnosis not present

## 2023-04-16 LAB — BMP8+EGFR
BUN/Creatinine Ratio: 18 (ref 12–28)
BUN: 21 mg/dL (ref 8–27)
CO2: 22 mmol/L (ref 20–29)
Calcium: 9.8 mg/dL (ref 8.7–10.3)
Chloride: 109 mmol/L — ABNORMAL HIGH (ref 96–106)
Creatinine, Ser: 1.18 mg/dL — ABNORMAL HIGH (ref 0.57–1.00)
Glucose: 153 mg/dL — ABNORMAL HIGH (ref 70–99)
Potassium: 4.3 mmol/L (ref 3.5–5.2)
Sodium: 143 mmol/L (ref 134–144)
eGFR: 46 mL/min/{1.73_m2} — ABNORMAL LOW (ref >59)

## 2023-04-16 NOTE — Patient Instructions (Addendum)
F/U in early December, call if you need me sooner  No med changes  Please get help at home for your husband so that your health will be good  Please schedule mammogram at checkout  Please get shingrix and TdAP vaccines at your pharmacy, both are past due  It is important that you exercise regularly at least 30 minutes 5 times a week. If you develop chest pain, have severe difficulty breathing, or feel very tired, stop exercising immediately and seek medical attention   Thanks for choosing [facility], we consider it a privelige to serve you.   Pls get microalb, fasting CBC, lipid, cmp and EGFr, and TSH end November  Thanks for choosing [facility], we consider it a privelige to serve you.
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
## 2023-04-16 NOTE — Assessment & Plan Note (Signed)
DASH diet and commitment to daily physical activity for a minimum of 30 minutes discussed and encouraged, as a part of hypertension management. The importance of attaining a healthy weight is also discussed.     04/16/2023   10:03 AM 04/16/2023    9:43 AM 04/16/2023    9:42 AM 02/13/2023    8:31 AM 02/01/2023   10:16 AM 01/16/2023    9:37 AM 01/16/2023    9:30 AM  BP/Weight  Systolic BP 150 149 150 146 148 142 158  Diastolic BP 70 53 75 56 76 70 74  Wt. (Lbs)   209.04 211.2 211.8  211.2  BMI   32.74 kg/m2 33.08 kg/m2 33.17 kg/m2  33.08 kg/m2     No change in medication, systolic blood pressure permissible for age

## 2023-04-17 NOTE — Assessment & Plan Note (Signed)
Rate controlled and on no anticoagullant

## 2023-04-17 NOTE — Assessment & Plan Note (Signed)
Hyperlipidemia:Low fat diet discussed and encouraged.   Lipid Panel  Lab Results  Component Value Date   CHOL 133 01/09/2023   HDL 42 01/09/2023   LDLCALC 73 01/09/2023   TRIG 94 01/09/2023   CHOLHDL 3.2 01/09/2023     Controlled, no change in medication Updated lab needed at/ before next visit.

## 2023-04-17 NOTE — Assessment & Plan Note (Signed)
Improved and managed by Endo Ms. Nesheim is reminded of the importance of commitment to daily physical activity for 30 minutes or more, as able and the need to limit carbohydrate intake to 30 to 60 grams per meal to help with blood sugar control.   The need to take medication as prescribed, test blood sugar as directed, and to call between visits if there is a concern that blood sugar is uncontrolled is also discussed.   Ms. Wool is reminded of the importance of daily foot exam, annual eye examination, and good blood sugar, blood pressure and cholesterol control.     Latest Ref Rng & Units 04/15/2023    9:15 AM 02/01/2023   12:59 PM 01/16/2023    9:49 AM 01/09/2023    8:35 AM 09/17/2022   10:18 AM  Diabetic Labs  HbA1c 4.0 - 5.6 %   7.6   9.6   Chol 100 - 199 mg/dL    829    HDL >56 mg/dL    42    Calc LDL 0 - 99 mg/dL    73    Triglycerides 0 - 149 mg/dL    94    Creatinine 2.13 - 1.00 mg/dL 0.86  5.78   4.69        04/16/2023   10:03 AM 04/16/2023    9:43 AM 04/16/2023    9:42 AM 02/13/2023    8:31 AM 02/01/2023   10:16 AM 01/16/2023    9:37 AM 01/16/2023    9:30 AM  BP/Weight  Systolic BP 150 149 150 146 148 142 158  Diastolic BP 70 53 75 56 76 70 74  Wt. (Lbs)   209.04 211.2 211.8  211.2  BMI   32.74 kg/m2 33.08 kg/m2 33.17 kg/m2  33.08 kg/m2      Latest Ref Rng & Units 09/27/2022   12:00 AM 12/27/2021    9:40 AM  Foot/eye exam completion dates  Eye Exam No Retinopathy No Retinopathy       Foot Form Completion   Done     This result is from an external source.

## 2023-04-17 NOTE — Assessment & Plan Note (Signed)
Managed by Endo and controlled on current medication 

## 2023-04-17 NOTE — Assessment & Plan Note (Signed)
stable °
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
## 2023-04-23 DIAGNOSIS — B351 Tinea unguium: Secondary | ICD-10-CM | POA: Diagnosis not present

## 2023-04-23 DIAGNOSIS — E1142 Type 2 diabetes mellitus with diabetic polyneuropathy: Secondary | ICD-10-CM | POA: Diagnosis not present

## 2023-05-02 ENCOUNTER — Other Ambulatory Visit: Payer: Self-pay

## 2023-05-02 MED ORDER — METOPROLOL TARTRATE 100 MG PO TABS: 100.0000 mg | ORAL_TABLET | Freq: Two times a day (BID) | ORAL | 0 refills | Status: DC

## 2023-05-02 NOTE — Telephone Encounter (Signed)
Refilled lopressor 100 mg bid #60 to local pharmacy as she awaits mail order delivery

## 2023-05-13 ENCOUNTER — Other Ambulatory Visit: Payer: Self-pay | Admitting: Family Medicine

## 2023-05-13 ENCOUNTER — Telehealth: Payer: Self-pay | Admitting: Family Medicine

## 2023-05-13 MED ORDER — SPIRONOLACTONE 50 MG PO TABS: 50.0000 mg | ORAL_TABLET | Freq: Two times a day (BID) | ORAL | 3 refills | Status: DC

## 2023-05-13 NOTE — Telephone Encounter (Signed)
Patient called says she had discussed switching her doses for Spironolactone to 1 AM and 1 PM but when she picked up the Rx it says to take one daily. Please advise Thank You

## 2023-05-13 NOTE — Telephone Encounter (Signed)
I spoke with Kimberly Freeman dose though the dose had recently been increased to twice daily, I have [facility] , pls call them on the phone and let them know also, thank you

## 2023-05-16 DIAGNOSIS — N1831 Chronic kidney disease, stage 3a: Secondary | ICD-10-CM | POA: Diagnosis not present

## 2023-05-16 DIAGNOSIS — E1122 Type 2 diabetes mellitus with diabetic chronic kidney disease: Secondary | ICD-10-CM | POA: Diagnosis not present

## 2023-05-16 DIAGNOSIS — Z794 Long term (current) use of insulin: Secondary | ICD-10-CM | POA: Diagnosis not present

## 2023-05-16 DIAGNOSIS — E039 Hypothyroidism, unspecified: Secondary | ICD-10-CM | POA: Diagnosis not present

## 2023-05-17 LAB — T4, FREE: Free T4: 1.01 ng/dL (ref 0.82–1.77)

## 2023-05-21 ENCOUNTER — Ambulatory Visit (INDEPENDENT_AMBULATORY_CARE_PROVIDER_SITE_OTHER): Payer: Medicare Other | Admitting: Nurse Practitioner

## 2023-05-21 ENCOUNTER — Encounter: Payer: Self-pay | Admitting: Nurse Practitioner

## 2023-05-21 VITALS — BP 142/60 | HR 63 | Ht 67.0 in | Wt 211.8 lb

## 2023-05-21 DIAGNOSIS — I10 Essential (primary) hypertension: Secondary | ICD-10-CM

## 2023-05-21 DIAGNOSIS — Z7984 Long term (current) use of oral hypoglycemic drugs: Secondary | ICD-10-CM

## 2023-05-21 DIAGNOSIS — E039 Hypothyroidism, unspecified: Secondary | ICD-10-CM

## 2023-05-21 DIAGNOSIS — E782 Mixed hyperlipidemia: Secondary | ICD-10-CM

## 2023-05-21 DIAGNOSIS — N1831 Chronic kidney disease, stage 3a: Secondary | ICD-10-CM

## 2023-05-21 DIAGNOSIS — Z794 Long term (current) use of insulin: Secondary | ICD-10-CM

## 2023-05-21 DIAGNOSIS — E1122 Type 2 diabetes mellitus with diabetic chronic kidney disease: Secondary | ICD-10-CM

## 2023-05-21 DIAGNOSIS — E559 Vitamin D deficiency, unspecified: Secondary | ICD-10-CM

## 2023-05-21 LAB — POCT GLYCOSYLATED HEMOGLOBIN (HGB A1C): Hemoglobin A1C: 7.5 % — AB (ref 4.0–5.6)

## 2023-05-21 NOTE — Progress Notes (Signed)
05/21/2023, 9:50 AM  Endocrinology follow-up note  Subjective:    Patient ID: Kimberly Freeman, female    DOB: 1940/12/19.  Kimberly Freeman is being seen in consultation for management of currently uncontrolled symptomatic diabetes requested by  [name], MD.   Past Medical History:  Diagnosis Date   Arthritis    CAD (coronary artery disease) 08/2019   60% RI with other 20-30% lesions at cath   Diabetes mellitus, type 2 (HCC)    Essential hypertension    GERD (gastroesophageal reflux disease)    Hyperlipidemia    LVH (left ventricular hypertrophy)     Past Surgical History:  Procedure Laterality Date   ABDOMINAL HYSTERECTOMY     BIOPSY  07/18/2017   Procedure: BIOPSY;  Surgeon: [name], MD;  Location: [facility];  Service: Endoscopy;;  gastric   CHOLECYSTECTOMY  2004   COLONOSCOPY N/A 07/18/2017   Dr. [name]: Internal and external hemorrhoids.  No future screening/surveillance colonoscopies due to age.   ESOPHAGOGASTRODUODENOSCOPY N/A 07/18/2017   Dr. [name]: Small hiatal hernia, patchy mild inflammation characterized by congestion, erosions, erythema in the cardia, gastric body and antrum.  Diffuse moderate inflammation in the duodenal bulb and second portion duodenum.  Biopsies benign.  No H. pylori.   LEFT HEART CATH AND CORONARY ANGIOGRAPHY N/A 09/21/2019   Procedure: LEFT HEART CATH AND CORONARY ANGIOGRAPHY;  Surgeon: [name], MD;  Location: [facility];  Service: Cardiovascular;  Laterality: N/A;    Social History   Socioeconomic History   Marital status: Married    Spouse name: [name]    Number of children: 0   Years of education: 12+   Highest education level: 12th grade  Occupational History   Occupation: retired   Tobacco Use   Smoking status: Never   Smokeless tobacco: Never  Vaping Use   Vaping status: Never Used  Substance and Sexual Activity    Alcohol use: No    Alcohol/week: 0.0 standard drinks of alcohol   Drug use: No   Sexual activity: Not on file  Other Topics Concern   Not on file  Social History Narrative   Lives alone with husband    Social Determinants of Health   Financial Resource Strain: Low Risk  (09/30/2021)   Overall Financial Resource Strain (CARDIA)    Difficulty of Paying Living Expenses: Not hard at all  Food Insecurity: No Food Insecurity (09/30/2021)   Hunger Vital Sign    Worried About Running Out of Food in the Last Year: Never true    Ran Out of Food in the Last Year: Never true  Transportation Needs: No Transportation Needs (09/30/2021)   PRAPARE - Administrator, Civil Service (Medical): No    Lack of Transportation (Non-Medical): No  Physical Activity: Insufficiently Active (09/30/2021)   Exercise Vital Sign    Days of Exercise per Week: 3 days    Minutes of Exercise per Session: 30 min  Stress: No Stress Concern Present (09/30/2021)   [employer] of Occupational Health - Occupational Stress Questionnaire    Feeling of Stress : Not at all  Social Connections: Socially Integrated (  09/30/2021)   Social Connection and Isolation Panel [NHANES]    Frequency of Communication with Friends and Family: More than three times a week    Frequency of Social Gatherings with Friends and Family: More than three times a week    Attends Religious Services: More than 4 times per year    Active Member of [employer] or Organizations: Yes    Attends Engineer, structural: More than 4 times per year    Marital Status: Married    Family History  Problem Relation Age of Onset   Leukemia Mother    Cancer Mother    Diabetes Mother    Prostate cancer Father    Lung cancer Sister 19   Diabetes Sister    Colon cancer Neg Hx     Outpatient Encounter Medications as of 05/21/2023  Medication Sig   acetaminophen (TYLENOL) 500 MG tablet Take 500 mg by mouth daily as needed for mild pain or  moderate pain. For pain   amLODipine (NORVASC) 10 MG tablet TAKE 1 TABLET DAILY (DOSE INCREASE)   aspirin 81 MG chewable tablet Chew 1 tablet (81 mg total) by mouth daily.   atorvastatin (LIPITOR) 80 MG tablet TAKE 1 TABLET DAILY AT 6 P.M.   BD PEN NEEDLE MICRO U/F 32G X 6 MM MISC USE TO INJECT INSULIN DAILY   betamethasone dipropionate 0.05 % cream Apply topically 2 (two) times daily.   calcium-vitamin D (OSCAL WITH D) 500-200 MG-UNIT tablet Take 1 tablet by mouth.   Easy Touch Lancets 28G MISC USE TO TEST TWICE A DAY   glipiZIDE (GLUCOTROL XL) 2.5 MG 24 hr tablet TAKE 1 TABLET DAILY WITH BREAKFAST   insulin degludec (TRESIBA FLEXTOUCH) 100 UNIT/ML FlexTouch Pen Inject 28 Units into the skin at bedtime.   irbesartan (AVAPRO) 300 MG tablet TAKE 1 TABLET DAILY   Lancets (ONETOUCH ULTRASOFT) lancets USE AS DIRECTED TWICE DAILY   levothyroxine (SYNTHROID) 50 MCG tablet TAKE 1 TABLET DAILY BEFORE BREAKFAST   metoprolol tartrate (LOPRESSOR) 100 MG tablet Take 1 tablet (100 mg total) by mouth 2 (two) times daily.   Multiple Vitamin (MULTIVITAMIN WITH MINERALS) TABS tablet Take 1 tablet by mouth daily.   ONETOUCH ULTRA test strip USE AS INSTRUCTED FOR TWICE A DAY TESTING   pantoprazole (PROTONIX) 40 MG tablet Take 40 mg by mouth daily.   polyethylene glycol (MIRALAX / GLYCOLAX) 17 g packet Take 17 g by mouth daily as needed.   spironolactone (ALDACTONE) 50 MG tablet Take 1 tablet (50 mg total) by mouth 2 (two) times daily.   spironolactone (ALDACTONE) 50 MG tablet Take 1 tablet (50 mg total) by mouth 2 (two) times daily.   No facility-administered encounter medications on file as of 05/21/2023.    ALLERGIES: Allergies  Allergen Reactions   Poison Oak Extract [Poison Oak Extract] Hives   Sulfonamide Derivatives Itching    Burning sensation all over body   Levemir [Insulin Detemir] Rash    VACCINATION STATUS: Immunization History  Administered Date(s) Administered   Fluad Quad(high Dose  65+) 07/27/2019, 06/23/2020, 06/29/2021, 08/01/2022   Influenza Split 08/01/2011, 07/31/2012   Influenza Whole 07/29/2007, 07/20/2008, 07/21/2009, 07/19/2010   Influenza, High Dose Seasonal PF 07/23/2018   Influenza,inj,Quad PF,6+ Mos 08/07/2013, 06/15/2014, 07/19/2015, 06/28/2016, 07/25/2017   Moderna SARS-COV2 Booster Vaccination 07/26/2021   Moderna Sars-Covid-2 Vaccination 12/22/2019, 01/19/2020, 09/25/2020, 01/29/2021   Pneumococcal Conjugate-13 10/12/2014   Pneumococcal Polysaccharide-23 03/16/2004, 08/29/2012   Td 03/16/2004   Tdap 03/31/2012   Zoster, Live 01/02/2007  Diabetes She presents for her follow-up diabetic visit. She has type 2 diabetes mellitus. Onset time: She was diagnosed at approximate age of 58 years. Her disease course has been stable. There are no hypoglycemic associated symptoms. Pertinent negatives for hypoglycemia include no confusion, headaches, pallor or seizures. Associated symptoms include fatigue. Pertinent negatives for diabetes include no chest pain, no polydipsia, no polyphagia and no polyuria. There are no hypoglycemic complications. Symptoms are stable. Diabetic complications include heart disease and nephropathy. Risk factors for coronary artery disease include diabetes mellitus, dyslipidemia, family history, hypertension, obesity, sedentary lifestyle and post-menopausal. Current diabetic treatment includes insulin injections and oral agent (monotherapy). She is compliant with treatment most of the time. Her weight is fluctuating minimally. She is following a generally healthy diet. When asked about meal planning, she reported none. She has had a previous visit with a dietitian. She participates in exercise intermittently. Her home blood glucose trend is fluctuating minimally. Her overall blood glucose range is 110-130 mg/dl. (She presents today with her meter and logs showing at goal glycemic profile overall.  Her POCT A1c today is 7.5%, improving from last  visit of 7.6%.  Analysis of her meter shows 7-day average of 104, 14-day average of 111, 30-day average of 128.  She denies any hypoglycemia. ) An ACE inhibitor/angiotensin II receptor blocker is not being taken. She sees a podiatrist.Eye exam is current.  Hyperlipidemia This is a chronic problem. The current episode started more than 1 year ago. The problem is controlled. Recent lipid tests were reviewed and are normal. Exacerbating diseases include chronic renal disease, diabetes and obesity. Factors aggravating her hyperlipidemia include beta blockers. Pertinent negatives include no chest pain, myalgias or shortness of breath. Current antihyperlipidemic treatment includes statins. The current treatment provides mild improvement of lipids. There are no compliance problems.  Risk factors for coronary artery disease include diabetes mellitus, dyslipidemia, obesity, hypertension, a sedentary lifestyle and post-menopausal.  Hypertension This is a chronic problem. The current episode started more than 1 year ago. The problem has been resolved since onset. The problem is controlled. Pertinent negatives include no chest pain, headaches, palpitations or shortness of breath. There are no associated agents to hypertension. Risk factors for coronary artery disease include sedentary lifestyle, obesity, post-menopausal state, diabetes mellitus and dyslipidemia. Past treatments include calcium channel blockers, beta blockers and diuretics. The current treatment provides mild improvement. There are no compliance problems.  Hypertensive end-organ damage includes kidney disease and CAD/MI. Identifiable causes of hypertension include chronic renal disease.    Review of systems  Constitutional: + Minimally fluctuating body weight,  current Body mass index is 33.17 kg/m. , no fatigue, no subjective hyperthermia, no subjective hypothermia Eyes: no blurry vision, no xerophthalmia ENT: no sore throat, no nodules palpated in  throat, no dysphagia/odynophagia, no hoarseness Cardiovascular: no chest pain, no shortness of breath, no palpitations, no leg swelling Respiratory: no cough, no shortness of breath Gastrointestinal: no nausea/vomiting/diarrhea Musculoskeletal: no muscle/joint aches Skin: no rashes, no hyperemia Neurological: no tremors, no numbness, no tingling, no dizziness Psychiatric: no depression, no anxiety   Objective:    BP (!) 142/60 (BP Location: Right Arm, Patient Position: Sitting, Cuff Size: Large) Comment: Retake Manuel Cuff - patient reports that she has takne her BP medicine  Pulse 63   Ht 5\' 7"  (1.702 m)   Wt 211 lb 12.8 oz (96.1 kg)   BMI 33.17 kg/m   Wt Readings from Last 3 Encounters:  05/21/23 211 lb 12.8 oz (96.1 kg)  04/16/23  209 lb 0.6 oz (94.8 kg)  02/13/23 211 lb 3.2 oz (95.8 kg)    BP Readings from Last 3 Encounters:  05/21/23 (!) 142/60  04/16/23 (!) 150/70  02/13/23 (!) 146/56     Physical Exam- Limited  Constitutional:  Body mass index is 33.17 kg/m. , not in acute distress, normal state of mind Eyes:  EOMI, no exophthalmos Musculoskeletal: no gross deformities, strength intact in all four extremities, no gross restriction of joint movements Skin:  no rashes, no hyperemia Neurological: no tremor with outstretched hands  Diabetic Foot Exam - Simple   No data filed      CMP     Component Value Date/Time   NA 146 (H) 05/16/2023 0759   K 4.1 05/16/2023 0759   CL 105 05/16/2023 0759   CO2 28 05/16/2023 0759   GLUCOSE 86 05/16/2023 0759   GLUCOSE 128 (H) 02/01/2023 1259   BUN 18 05/16/2023 0759   CREATININE 1.24 (H) 05/16/2023 0759   CREATININE 1.14 (H) 12/15/2019 1048   CALCIUM 10.1 05/16/2023 0759   PROT 7.0 05/16/2023 0759   ALBUMIN 4.4 05/16/2023 0759   AST 20 05/16/2023 0759   ALT 23 05/16/2023 0759   ALKPHOS 115 05/16/2023 0759   BILITOT 0.3 05/16/2023 0759   GFRNONAA 50 (L) 02/01/2023 1259   GFRNONAA 45 (L) 12/15/2019 1048   GFRAA 48  (L) 09/05/2020 0810   GFRAA 53 (L) 12/15/2019 1048     Diabetic Labs (most recent): Lab Results  Component Value Date   HGBA1C 7.5 (A) 05/21/2023   HGBA1C 7.6 (A) 01/16/2023   HGBA1C 9.6 (A) 09/17/2022   MICROALBUR 1.5 07/24/2019   MICROALBUR 0.7 04/02/2018   MICROALBUR 8.8 (H) 11/27/2017     Lipid Panel ( most recent) Lipid Panel     Component Value Date/Time   CHOL 133 01/09/2023 0835   TRIG 94 01/09/2023 0835   HDL 42 01/09/2023 0835   CHOLHDL 3.2 01/09/2023 0835   CHOLHDL 3.3 12/15/2019 1048   VLDL 21 03/22/2017 0803   LDLCALC 73 01/09/2023 0835   LDLCALC 80 12/15/2019 1048   LABVLDL 18 01/09/2023 0835      Lab Results  Component Value Date   TSH 3.650 05/16/2023   TSH 0.849 09/12/2022   TSH 4.710 (H) 05/14/2022   TSH 4.290 12/25/2021   TSH 8.400 (H) 08/29/2021   TSH 7.640 (H) 04/28/2021   TSH 4.360 05/23/2020   TSH 6.85 (H) 07/24/2019   TSH 5.19 (H) 08/04/2018   TSH 3.81 04/10/2016   FREET4 1.01 05/16/2023   FREET4 1.05 09/12/2022   FREET4 0.79 (L) 05/14/2022   FREET4 0.83 12/25/2021   FREET4 0.75 (L) 08/29/2021   FREET4 0.75 (L) 04/28/2021   FREET4 1.0 12/15/2019   FREET4 0.8 07/24/2019   FREET4 0.9 08/04/2018   FREET4 0.78 12/13/2015      Latest Reference Range & Units 08/29/21 08:17 12/25/21 08:45 05/14/22 09:11 09/12/22 09:12 05/16/23 07:59  TSH 0.450 - 4.500 uIU/mL 8.400 (H) 4.290 4.710 (H) 0.849 3.650  T4,Free(Direct) 0.82 - 1.77 ng/dL 6.26 (L) 9.48 5.46 (L) 1.05 1.01  (H): Data is abnormally high (L): Data is abnormally low  Assessment & Plan:   1) Type 2 diabetes mellitus with stage 2 chronic kidney disease, with long-term current use of insulin (HCC)  - Kimberly Freeman has currently uncontrolled symptomatic type 2 DM since  82 years of age. Recent labs reviewed.  She presents today with her meter and logs showing at goal glycemic  profile overall.  Her POCT A1c today is 7.5%, improving from last visit of 7.6%.  Analysis of her meter  shows 7-day average of 104, 14-day average of 111, 30-day average of 128.  She denies any hypoglycemia. She does get steroid injections in her knees from time to time.  - I had a long discussion with her about the progressive nature of diabetes and the pathology behind its complications. -her diabetes is complicated by stage 2 CKD, obesity /sedentary life, and she remains at a high risk for more acute and chronic complications which include CAD, CVA, CKD, retinopathy, and neuropathy. These are all discussed in detail with her.  - Nutritional counseling repeated at each appointment due to patients tendency to fall back in to old habits.  - The patient admits there is a room for improvement in their diet and drink choices. -  Suggestion is made for the patient to avoid simple carbohydrates from their diet including Cakes, Sweet Desserts / Pastries, Ice Cream, Soda (diet and regular), Sweet Tea, Candies, Chips, Cookies, Sweet Pastries, Store Bought Juices, Alcohol in Excess of 1-2 drinks a day, Artificial Sweeteners, Coffee Creamer, and "Sugar-free" Products. This will help patient to have stable blood glucose profile and potentially avoid unintended weight gain.   - I encouraged the patient to switch to unprocessed or minimally processed complex starch and increased protein intake (animal or plant source), fruits, and vegetables.   - Patient is advised to stick to a routine mealtimes to eat 3 meals a day and avoid unnecessary snacks (to snack only to correct hypoglycemia).  - I have approached her with the following individualized plan to manage  her diabetes and patient agrees:   - she will continue to need at least basal insulin in order for her to achieve control of diabetes to target.    -Given her stable glycemic profile and lack of hypoglycemia, no changes will be made to her medications today.  She is advised to continue Tresiba 28 units SQ nightly and Glipizide 2.5 mg XL daily with breakfast.     -She is encouraged to continue monitoring glucose twice daily (consistently), before breakfast and before bed, and to call the clinic if she has readings less than 70 or greater than 300 for 3 tests in a row.  -She will be considered for incretin therapy during her next visit.  - Specific targets for  A1c;  LDL, HDL,  and Triglycerides were discussed with the patient.  2) Blood Pressure /Hypertension:  Her blood pressure is controlled to target for her age.  She is advised to continue Norvasc 10 mg po daily, Irbesartan 300 mg po daily, Metoprolol 100 mg po twice daily, and Aldactone 50 mg po daily.  3) Lipids/Hyperlipidemia:    Her most recent lipid panel from 01/09/23 shows controlled LDL at 73.  She is advised to continue Atorvastatin 80 mg po daily at bedtime.  Side effects and precautions discussed with her.  4)  Weight/Diet:  Her Body mass index is 33.17 kg/m.  -   clearly complicating her diabetes care.   she is  a candidate for weight loss. I discussed with her the fact that loss of 5 - 10% of her  current body weight will have the most impact on her diabetes management.  Exercise, and detailed carbohydrates information provided  -  detailed on discharge instructions.  5) Hypothyroidism- unspecified Her previsit TFTs are consistent with appropriate hormone replacement.  She is advised to continue Levothyroxine 50  mcg po daily before breakfast.    - The correct intake of thyroid hormone (Levothyroxine, Synthroid), is on empty stomach first thing in the morning, with water, separated by at least 30 minutes from breakfast and other medications,  and separated by more than 4 hours from calcium, iron, multivitamins, acid reflux medications (PPIs).  - This medication is a life-long medication and will be needed to correct thyroid hormone imbalances for the rest of your life.  The dose may change from time to time, based on thyroid blood work.  - It is extremely important to be consistent  taking this medication, near the same time each morning.  -AVOID TAKING PRODUCTS CONTAINING BIOTIN (commonly found in Hair, Skin, Nails vitamins) AS IT INTERFERES WITH THE VALIDITY OF THYROID FUNCTION BLOOD TESTS.  6) Chronic Care/Health Maintenance: -she is on Statin medications and  is encouraged to initiate and continue to follow up with Ophthalmology, Dentist,  Podiatrist at least yearly or according to recommendations, and advised to stay away from smoking. I have recommended yearly flu vaccine and pneumonia vaccine at least every 5 years; moderate intensity exercise for up to 150 minutes weekly; and  sleep for at least 7 hours a day.  - she is advised to maintain close follow up with [name], MD for primary care needs, as well as her other providers for optimal and coordinated care.     I spent  26  minutes in the care of the patient today including review of labs from CMP, Lipids, Thyroid Function, Hematology (current and previous including abstractions from other facilities); face-to-face time discussing  her blood glucose readings/logs, discussing hypoglycemia and hyperglycemia episodes and symptoms, medications doses, her options of short and long term treatment based on the latest standards of care / guidelines;  discussion about incorporating lifestyle medicine;  and documenting the encounter. Risk reduction counseling performed per USPSTF guidelines to reduce obesity and cardiovascular risk factors.     Please refer to Patient Instructions for Blood Glucose Monitoring and Insulin/Medications Dosing Guide"  in media tab for additional information. Please  also refer to " Patient Self Inventory" in the Media  tab for reviewed elements of pertinent patient history.  Mauro Kaufmann participated in the discussions, expressed understanding, and voiced agreement with the above plans.  All questions were answered to her satisfaction. she is encouraged to contact clinic should she  have any questions or concerns prior to her return visit.   Follow up plan: - Return in about 4 months (around 09/21/2023) for Diabetes F/U with A1c in office, Thyroid follow up, No previsit labs, Bring meter and logs.  Ronny Bacon, [facility] [address] [place], [place] [address] Phone: [phone] Fax: [phone]  05/21/2023, 9:50 AM

## 2023-05-21 NOTE — Patient Instructions (Signed)

## 2023-06-12 ENCOUNTER — Other Ambulatory Visit: Payer: Self-pay | Admitting: Family Medicine

## 2023-06-20 ENCOUNTER — Ambulatory Visit: Payer: Medicare Other | Attending: Cardiology | Admitting: Cardiology

## 2023-06-20 ENCOUNTER — Encounter: Payer: Self-pay | Admitting: Cardiology

## 2023-06-20 VITALS — BP 148/54 | HR 63 | Ht 67.0 in | Wt 205.8 lb

## 2023-06-20 DIAGNOSIS — E782 Mixed hyperlipidemia: Secondary | ICD-10-CM | POA: Diagnosis not present

## 2023-06-20 DIAGNOSIS — I25119 Atherosclerotic heart disease of native coronary artery with unspecified angina pectoris: Secondary | ICD-10-CM | POA: Diagnosis not present

## 2023-06-20 DIAGNOSIS — I10 Essential (primary) hypertension: Secondary | ICD-10-CM | POA: Insufficient documentation

## 2023-06-20 DIAGNOSIS — I350 Nonrheumatic aortic (valve) stenosis: Secondary | ICD-10-CM | POA: Insufficient documentation

## 2023-06-20 NOTE — Patient Instructions (Signed)
Medication Instructions:  Your physician recommends that you continue on your current medications as directed. Please refer to the Current Medication list given to you today.   Labwork: None today  Testing/Procedures: None today  Follow-Up: 1 year  Any Other Special Instructions Will Be Listed Below (If Applicable).  If you need a refill on your cardiac medications before your next appointment, please call your pharmacy.  

## 2023-06-20 NOTE — Progress Notes (Signed)
Cardiology Office Note  Date: 06/20/2023   ID: [name], [name] [birth year], MRN [id number]  History of Present Illness: Kimberly Freeman is an 82 y.o. female last seen in August 2023.  She is here for a routine visit.  Reports no interval exertional chest pain or increasing dyspnea on exertion with typical activity.  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  They still live at home.  I reviewed her medications which are stable in terms of antihypertensive regimen.  She remains on high-dose Lipitor with last LDL 73.  ECG today shows sinus rhythm with right bundle branch block, increased voltage, and diffuse repolarization abnormalities.  Echocardiogram from September 2023 revealed LVEF 60 to 65% and mild calcific aortic stenosis as noted below.  Physical Exam: VS:  BP (!) 148/54 (BP Location: Right Arm, Patient Position: Sitting, Cuff Size: Normal)   Pulse 63   Ht 5\' 7"  (1.702 m)   Wt 205 lb 12.8 oz (93.4 kg)   SpO2 95%   BMI 32.23 kg/m , BMI Body mass index is 32.23 kg/m.  Wt Readings from Last 3 Encounters:  06/20/23 205 lb 12.8 oz (93.4 kg)  05/21/23 211 lb 12.8 oz (96.1 kg)  04/16/23 209 lb 0.6 oz (94.8 kg)    General: Patient appears comfortable at rest. HEENT: Conjunctiva and lids normal. Neck: Supple, no elevated JVP or carotid bruits. Lungs: Clear to auscultation, nonlabored breathing at rest. Cardiac: Regular rate and rhythm, no S3, 2/6 systolic murmur. Extremities: No pitting edema.  ECG:  An ECG dated 06/20/2022 was personally reviewed today and demonstrated:  Sinus rhythm with right bundle branch block and repolarization abnormalities.  Labwork: 02/01/2023: Hemoglobin 11.1; Platelets 191 05/16/2023: ALT 23; AST 20; BUN 18; Creatinine, Ser 1.24; Potassium 4.1; Sodium 146; TSH 3.650     Component Value Date/Time   CHOL 133 01/09/2023 0835   TRIG 94 01/09/2023 0835   HDL 42  01/09/2023 0835   CHOLHDL 3.2 01/09/2023 0835   CHOLHDL 3.3 12/15/2019 1048   VLDL 21 03/22/2017 0803   LDLCALC 73 01/09/2023 0835   LDLCALC 80 12/15/2019 1048   Other Studies Reviewed Today:  Echocardiogram 06/26/2022:  1. Left ventricular ejection fraction, by estimation, is 60 to 65%. The  left ventricle has normal function. The left ventricle has no regional  wall motion abnormalities. There is severe concentric left ventricular  hypertrophy. Left ventricular diastolic   parameters are consistent with Grade II diastolic dysfunction  (pseudonormalization). There is a mid cavitary gradient of 10 mm Hg with  no LVOT Obstruction seen.   2. Right ventricular systolic function is normal. The right ventricular  size is mildly enlarged. Tricuspid regurgitation signal is inadequate for  assessing PA pressure.   3. Left atrial size was moderately dilated.   4. A small pericardial effusion is present. The pericardial effusion is  circumferential. There is no evidence of cardiac tamponade.   5. The mitral valve is grossly normal. No evidence of mitral valve  regurgitation. No evidence of mitral stenosis.   6. The aortic valve is tricuspid. There is moderate calcification of the  aortic valve. There is moderate thickening of the aortic valve. Aortic  valve regurgitation is trivial. Mild aortic valve stenosis. Normal stroke  volume index, DVI 0.5, 2D  planimetry valve are 2.1 cm2.   Assessment and Plan:  1.  CAD, mild to moderate by cardiac catheterization  in November 2020.  LVEF 60 to 65%.  She does not report any active angina.  ECG reviewed and stable.  Plan to continue observation on medical therapy which now includes aspirin, Lipitor, Lopressor, and Norvasc.  2.  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  She is asymptomatic.  Cardiac murmur stable.  3.  Essential hypertension.  Continue multimodal therapy with follow-up per Dr.  Lodema Hong.  No changes were made today.  4.  Mixed hyperlipidemia.  Continue Lipitor 80 mg daily.  Last LDL was 73.  Disposition:  Follow up  1 year, sooner if needed.  Signed, Jonelle Sidle, M.D., F.A.C.C. [facility] at [facility]

## 2023-07-09 DIAGNOSIS — B351 Tinea unguium: Secondary | ICD-10-CM | POA: Diagnosis not present

## 2023-07-09 DIAGNOSIS — E1142 Type 2 diabetes mellitus with diabetic polyneuropathy: Secondary | ICD-10-CM | POA: Diagnosis not present

## 2023-07-10 ENCOUNTER — Telehealth: Payer: Self-pay | Admitting: Nurse Practitioner

## 2023-07-10 ENCOUNTER — Other Ambulatory Visit: Payer: Self-pay | Admitting: *Deleted

## 2023-07-10 DIAGNOSIS — Z794 Long term (current) use of insulin: Secondary | ICD-10-CM

## 2023-07-10 DIAGNOSIS — Z7984 Long term (current) use of oral hypoglycemic drugs: Secondary | ICD-10-CM

## 2023-07-10 DIAGNOSIS — N1831 Chronic kidney disease, stage 3a: Secondary | ICD-10-CM

## 2023-07-10 DIAGNOSIS — E1122 Type 2 diabetes mellitus with diabetic chronic kidney disease: Secondary | ICD-10-CM

## 2023-07-10 MED ORDER — ONETOUCH ULTRA VI STRP
ORAL_STRIP | 1 refills | Status: DC
Start: 2023-07-10 — End: 2024-05-06

## 2023-07-10 NOTE — Telephone Encounter (Signed)
A prescription was sent for a 90 day supply to the [facility] in [place], [place]

## 2023-07-10 NOTE — Telephone Encounter (Signed)
Pt needs test strips for one touch ultra soft sent to [facility] in [place].

## 2023-07-16 ENCOUNTER — Ambulatory Visit (INDEPENDENT_AMBULATORY_CARE_PROVIDER_SITE_OTHER): Payer: Medicare Other

## 2023-07-16 DIAGNOSIS — Z23 Encounter for immunization: Secondary | ICD-10-CM

## 2023-07-23 ENCOUNTER — Other Ambulatory Visit: Payer: Self-pay | Admitting: Family Medicine

## 2023-09-04 ENCOUNTER — Ambulatory Visit (HOSPITAL_COMMUNITY)
Admission: RE | Admit: 2023-09-04 | Discharge: 2023-09-04 | Disposition: A | Discharge status: Home / Self Care | Payer: Medicare Other | Source: Ambulatory Visit | Attending: Family Medicine | Admitting: Family Medicine

## 2023-09-04 DIAGNOSIS — Z1231 Encounter for screening mammogram for malignant neoplasm of breast: Secondary | ICD-10-CM | POA: Diagnosis not present

## 2023-09-24 ENCOUNTER — Encounter: Payer: Self-pay | Admitting: Nurse Practitioner

## 2023-09-24 ENCOUNTER — Ambulatory Visit (INDEPENDENT_AMBULATORY_CARE_PROVIDER_SITE_OTHER): Payer: Medicare Other | Admitting: Nurse Practitioner

## 2023-09-24 VITALS — BP 138/68 | HR 59 | Ht 67.0 in | Wt 212.4 lb

## 2023-09-24 DIAGNOSIS — E782 Mixed hyperlipidemia: Secondary | ICD-10-CM | POA: Diagnosis not present

## 2023-09-24 DIAGNOSIS — E1122 Type 2 diabetes mellitus with diabetic chronic kidney disease: Secondary | ICD-10-CM

## 2023-09-24 DIAGNOSIS — N1831 Chronic kidney disease, stage 3a: Secondary | ICD-10-CM

## 2023-09-24 DIAGNOSIS — Z7984 Long term (current) use of oral hypoglycemic drugs: Secondary | ICD-10-CM | POA: Diagnosis not present

## 2023-09-24 DIAGNOSIS — E039 Hypothyroidism, unspecified: Secondary | ICD-10-CM | POA: Diagnosis not present

## 2023-09-24 DIAGNOSIS — E559 Vitamin D deficiency, unspecified: Secondary | ICD-10-CM

## 2023-09-24 DIAGNOSIS — Z794 Long term (current) use of insulin: Secondary | ICD-10-CM | POA: Diagnosis not present

## 2023-09-24 DIAGNOSIS — I10 Essential (primary) hypertension: Secondary | ICD-10-CM

## 2023-09-24 LAB — POCT GLYCOSYLATED HEMOGLOBIN (HGB A1C): Hemoglobin A1C: 7.8 % — AB (ref 4.0–5.6)

## 2023-09-24 MED ORDER — TRESIBA FLEXTOUCH 100 UNIT/ML ~~LOC~~ SOPN: 28.0000 [IU] | PEN_INJECTOR | Freq: Every day | SUBCUTANEOUS | 3 refills | Status: DC

## 2023-09-24 MED ORDER — LEVOTHYROXINE SODIUM 50 MCG PO TABS: 50.0000 ug | ORAL_TABLET | Freq: Every day | ORAL | 3 refills | Status: DC

## 2023-09-24 MED ORDER — GLIPIZIDE ER 2.5 MG PO TB24: 2.5000 mg | ORAL_TABLET | Freq: Every day | ORAL | 3 refills | Status: AC

## 2023-09-24 NOTE — Progress Notes (Signed)
09/24/2023, 9:47 AM  Endocrinology follow-up note  Subjective:    Patient ID: Kimberly Freeman, female    DOB: 1941/03/07.  Kimberly Freeman is being seen in consultation for management of currently uncontrolled symptomatic diabetes requested by  [name], MD.   Past Medical History:  Diagnosis Date   Arthritis    CAD (coronary artery disease) 08/2019   60% RI with other 20-30% lesions at cath   Diabetes mellitus, type 2 (HCC)    Essential hypertension    GERD (gastroesophageal reflux disease)    Hyperlipidemia    LVH (left ventricular hypertrophy)     Past Surgical History:  Procedure Laterality Date   ABDOMINAL HYSTERECTOMY     BIOPSY  07/18/2017   Procedure: BIOPSY;  Surgeon: [name], MD;  Location: [facility];  Service: Endoscopy;;  gastric   CHOLECYSTECTOMY  2004   COLONOSCOPY N/A 07/18/2017   Dr. [name]: Internal and external hemorrhoids.  No future screening/surveillance colonoscopies due to age.   ESOPHAGOGASTRODUODENOSCOPY N/A 07/18/2017   Dr. [name]: Small hiatal hernia, patchy mild inflammation characterized by congestion, erosions, erythema in the cardia, gastric body and antrum.  Diffuse moderate inflammation in the duodenal bulb and second portion duodenum.  Biopsies benign.  No H. pylori.   LEFT HEART CATH AND CORONARY ANGIOGRAPHY N/A 09/21/2019   Procedure: LEFT HEART CATH AND CORONARY ANGIOGRAPHY;  Surgeon: [name], MD;  Location: [facility];  Service: Cardiovascular;  Laterality: N/A;    Social History   Socioeconomic History   Marital status: Married    Spouse name: [name]    Number of children: 0   Years of education: 12+   Highest education level: 12th grade  Occupational History   Occupation: retired   Tobacco Use   Smoking status: Never   Smokeless tobacco: Never  Vaping Use   Vaping status: Never Used  Substance and Sexual Activity    Alcohol use: No    Alcohol/week: 0.0 standard drinks of alcohol   Drug use: No   Sexual activity: Not on file  Other Topics Concern   Not on file  Social History Narrative   Lives alone with husband    Social Determinants of Health   Financial Resource Strain: Low Risk  (09/30/2021)   Overall Financial Resource Strain (CARDIA)    Difficulty of Paying Living Expenses: Not hard at all  Food Insecurity: No Food Insecurity (09/30/2021)   Hunger Vital Sign    Worried About Running Out of Food in the Last Year: Never true    Ran Out of Food in the Last Year: Never true  Transportation Needs: No Transportation Needs (09/30/2021)   PRAPARE - Administrator, Civil Service (Medical): No    Lack of Transportation (Non-Medical): No  Physical Activity: Insufficiently Active (09/30/2021)   Exercise Vital Sign    Days of Exercise per Week: 3 days    Minutes of Exercise per Session: 30 min  Stress: No Stress Concern Present (09/30/2021)   [employer] of Occupational Health - Occupational Stress Questionnaire    Feeling of Stress : Not at all  Social Connections: Socially Integrated (  09/30/2021)   Social Connection and Isolation Panel [NHANES]    Frequency of Communication with Friends and Family: More than three times a week    Frequency of Social Gatherings with Friends and Family: More than three times a week    Attends Religious Services: More than 4 times per year    Active Member of [employer] or Organizations: Yes    Attends Engineer, structural: More than 4 times per year    Marital Status: Married    Family History  Problem Relation Age of Onset   Leukemia Mother    Cancer Mother    Diabetes Mother    Prostate cancer Father    Lung cancer Sister 48   Diabetes Sister    Colon cancer Neg Hx     Outpatient Encounter Medications as of 09/24/2023  Medication Sig   acetaminophen (TYLENOL) 500 MG tablet Take 500 mg by mouth daily as needed for mild pain or  moderate pain. For pain   amLODipine (NORVASC) 10 MG tablet TAKE 1 TABLET DAILY (DOSE INCREASE)   aspirin 81 MG chewable tablet Chew 1 tablet (81 mg total) by mouth daily.   atorvastatin (LIPITOR) 80 MG tablet TAKE 1 TABLET DAILY AT 6 P.M.   BD PEN NEEDLE MICRO U/F 32G X 6 MM MISC USE TO INJECT INSULIN DAILY   betamethasone dipropionate 0.05 % cream Apply topically 2 (two) times daily.   calcium-vitamin D (OSCAL WITH D) 500-200 MG-UNIT tablet Take 1 tablet by mouth.   Easy Touch Lancets 28G MISC USE TO TEST TWICE A DAY   glucose blood (ONETOUCH ULTRA) test strip Use as instructed   irbesartan (AVAPRO) 300 MG tablet TAKE 1 TABLET DAILY   Lancets (ONETOUCH ULTRASOFT) lancets USE AS DIRECTED TWICE DAILY   metoprolol tartrate (LOPRESSOR) 100 MG tablet Take 1 tablet (100 mg total) by mouth 2 (two) times daily.   Multiple Vitamin (MULTIVITAMIN WITH MINERALS) TABS tablet Take 1 tablet by mouth daily.   pantoprazole (PROTONIX) 40 MG tablet Take 40 mg by mouth daily.   polyethylene glycol (MIRALAX / GLYCOLAX) 17 g packet Take 17 g by mouth daily as needed.   spironolactone (ALDACTONE) 50 MG tablet Take 1 tablet (50 mg total) by mouth 2 (two) times daily.   spironolactone (ALDACTONE) 50 MG tablet Take 1 tablet (50 mg total) by mouth 2 (two) times daily.   [DISCONTINUED] glipiZIDE (GLUCOTROL XL) 2.5 MG 24 hr tablet TAKE 1 TABLET DAILY WITH BREAKFAST   [DISCONTINUED] insulin degludec (TRESIBA FLEXTOUCH) 100 UNIT/ML FlexTouch Pen Inject 28 Units into the skin at bedtime.   [DISCONTINUED] levothyroxine (SYNTHROID) 50 MCG tablet TAKE 1 TABLET DAILY BEFORE BREAKFAST   glipiZIDE (GLUCOTROL XL) 2.5 MG 24 hr tablet Take 1 tablet (2.5 mg total) by mouth daily with breakfast.   insulin degludec (TRESIBA FLEXTOUCH) 100 UNIT/ML FlexTouch Pen Inject 28 Units into the skin at bedtime.   levothyroxine (SYNTHROID) 50 MCG tablet Take 1 tablet (50 mcg total) by mouth daily before breakfast.   No facility-administered  encounter medications on file as of 09/24/2023.    ALLERGIES: Allergies  Allergen Reactions   Poison Oak Extract [Poison Oak Extract] Hives   Sulfonamide Derivatives Itching    Burning sensation all over body   Levemir [Insulin Detemir] Rash    VACCINATION STATUS: Immunization History  Administered Date(s) Administered   Fluad Quad(high Dose 65+) 07/27/2019, 06/23/2020, 06/29/2021, 08/01/2022   Fluad Trivalent(High Dose 65+) 07/16/2023   Influenza Split 08/01/2011, 07/31/2012   Influenza  Whole 07/29/2007, 07/20/2008, 07/21/2009, 07/19/2010   Influenza, High Dose Seasonal PF 07/23/2018   Influenza,inj,Quad PF,6+ Mos 08/07/2013, 06/15/2014, 07/19/2015, 06/28/2016, 07/25/2017   Moderna SARS-COV2 Booster Vaccination 07/26/2021   Moderna Sars-Covid-2 Vaccination 12/22/2019, 01/19/2020, 09/25/2020, 01/29/2021   Pneumococcal Conjugate-13 10/12/2014   Pneumococcal Polysaccharide-23 03/16/2004, 08/29/2012   Td 03/16/2004   Tdap 03/31/2012   Zoster, Live 01/02/2007    Diabetes She presents for her follow-up diabetic visit. She has type 2 diabetes mellitus. Onset time: She was diagnosed at approximate age of 72 years. Her disease course has been stable. There are no hypoglycemic associated symptoms. Pertinent negatives for hypoglycemia include no confusion, headaches, pallor or seizures. Associated symptoms include fatigue. Pertinent negatives for diabetes include no chest pain, no polydipsia, no polyphagia and no polyuria. There are no hypoglycemic complications. Symptoms are stable. Diabetic complications include heart disease and nephropathy. Risk factors for coronary artery disease include diabetes mellitus, dyslipidemia, family history, hypertension, obesity, sedentary lifestyle and post-menopausal. Current diabetic treatment includes insulin injections and oral agent (monotherapy). She is compliant with treatment most of the time. Her weight is fluctuating minimally. She is following a  generally healthy diet. When asked about meal planning, she reported none. She has had a previous visit with a dietitian. She participates in exercise intermittently. Her home blood glucose trend is fluctuating minimally. Her breakfast blood glucose range is generally 110-130 mg/dl. Her overall blood glucose range is 130-140 mg/dl. (She presents today with her meter and logs showing at goal glycemic profile overall.  Her POCT A1c today is 7.8%, increasing from last visit of 7.5%.  Analysis of her meter shows 7-day average of 123, 14-day average of 126, 30-day average of 111.  She denies any hypoglycemia.  She does note she has cheated on her diet some over the holiday season. ) An ACE inhibitor/angiotensin II receptor blocker is not being taken. She sees a podiatrist.Eye exam is current.  Hyperlipidemia This is a chronic problem. The current episode started more than 1 year ago. The problem is controlled. Recent lipid tests were reviewed and are normal. Exacerbating diseases include chronic renal disease, diabetes and obesity. Factors aggravating her hyperlipidemia include beta blockers. Pertinent negatives include no chest pain, myalgias or shortness of breath. Current antihyperlipidemic treatment includes statins. The current treatment provides mild improvement of lipids. There are no compliance problems.  Risk factors for coronary artery disease include diabetes mellitus, dyslipidemia, obesity, hypertension, a sedentary lifestyle and post-menopausal.  Hypertension This is a chronic problem. The current episode started more than 1 year ago. The problem has been resolved since onset. The problem is controlled. Pertinent negatives include no chest pain, headaches, palpitations or shortness of breath. There are no associated agents to hypertension. Risk factors for coronary artery disease include sedentary lifestyle, obesity, post-menopausal state, diabetes mellitus and dyslipidemia. Past treatments include  calcium channel blockers, beta blockers and diuretics. The current treatment provides mild improvement. There are no compliance problems.  Hypertensive end-organ damage includes kidney disease and CAD/MI. Identifiable causes of hypertension include chronic renal disease.    Review of systems  Constitutional: + Minimally fluctuating body weight,  current Body mass index is 33.27 kg/m. , no fatigue, no subjective hyperthermia, no subjective hypothermia Eyes: no blurry vision, no xerophthalmia ENT: no sore throat, no nodules palpated in throat, no dysphagia/odynophagia, no hoarseness Cardiovascular: no chest pain, no shortness of breath, no palpitations, no leg swelling Respiratory: no cough, no shortness of breath Gastrointestinal: no nausea/vomiting/diarrhea Musculoskeletal: no muscle/joint aches Skin: no rashes, no  hyperemia Neurological: no tremors, no numbness, no tingling, no dizziness Psychiatric: no depression, no anxiety   Objective:    BP 138/68 (BP Location: Right Arm, Patient Position: Sitting, Cuff Size: Large)   Pulse (!) 59   Ht 5\' 7"  (1.702 m)   Wt 212 lb 6.4 oz (96.3 kg)   BMI 33.27 kg/m   Wt Readings from Last 3 Encounters:  09/24/23 212 lb 6.4 oz (96.3 kg)  06/20/23 205 lb 12.8 oz (93.4 kg)  05/21/23 211 lb 12.8 oz (96.1 kg)    BP Readings from Last 3 Encounters:  09/24/23 138/68  06/20/23 (!) 148/54  05/21/23 (!) 142/60     Physical Exam- Limited  Constitutional:  Body mass index is 33.27 kg/m. , not in acute distress, normal state of mind Eyes:  EOMI, no exophthalmos Musculoskeletal: no gross deformities, strength intact in all four extremities, no gross restriction of joint movements Skin:  no rashes, no hyperemia Neurological: no tremor with outstretched hands  Diabetic Foot Exam - Simple   No data filed      CMP     Component Value Date/Time   NA 146 (H) 05/16/2023 0759   K 4.1 05/16/2023 0759   CL 105 05/16/2023 0759   CO2 28 05/16/2023  0759   GLUCOSE 86 05/16/2023 0759   GLUCOSE 128 (H) 02/01/2023 1259   BUN 18 05/16/2023 0759   CREATININE 1.24 (H) 05/16/2023 0759   CREATININE 1.14 (H) 12/15/2019 1048   CALCIUM 10.1 05/16/2023 0759   PROT 7.0 05/16/2023 0759   ALBUMIN 4.4 05/16/2023 0759   AST 20 05/16/2023 0759   ALT 23 05/16/2023 0759   ALKPHOS 115 05/16/2023 0759   BILITOT 0.3 05/16/2023 0759   GFRNONAA 50 (L) 02/01/2023 1259   GFRNONAA 45 (L) 12/15/2019 1048   GFRAA 48 (L) 09/05/2020 0810   GFRAA 53 (L) 12/15/2019 1048     Diabetic Labs (most recent): Lab Results  Component Value Date   HGBA1C 7.8 (A) 09/24/2023   HGBA1C 7.5 (A) 05/21/2023   HGBA1C 7.6 (A) 01/16/2023   MICROALBUR 1.5 07/24/2019   MICROALBUR 0.7 04/02/2018   MICROALBUR 8.8 (H) 11/27/2017     Lipid Panel ( most recent) Lipid Panel     Component Value Date/Time   CHOL 133 01/09/2023 0835   TRIG 94 01/09/2023 0835   HDL 42 01/09/2023 0835   CHOLHDL 3.2 01/09/2023 0835   CHOLHDL 3.3 12/15/2019 1048   VLDL 21 03/22/2017 0803   LDLCALC 73 01/09/2023 0835   LDLCALC 80 12/15/2019 1048   LABVLDL 18 01/09/2023 0835      Lab Results  Component Value Date   TSH 3.650 05/16/2023   TSH 0.849 09/12/2022   TSH 4.710 (H) 05/14/2022   TSH 4.290 12/25/2021   TSH 8.400 (H) 08/29/2021   TSH 7.640 (H) 04/28/2021   TSH 4.360 05/23/2020   TSH 6.85 (H) 07/24/2019   TSH 5.19 (H) 08/04/2018   TSH 3.81 04/10/2016   FREET4 1.01 05/16/2023   FREET4 1.05 09/12/2022   FREET4 0.79 (L) 05/14/2022   FREET4 0.83 12/25/2021   FREET4 0.75 (L) 08/29/2021   FREET4 0.75 (L) 04/28/2021   FREET4 1.0 12/15/2019   FREET4 0.8 07/24/2019   FREET4 0.9 08/04/2018   FREET4 0.78 12/13/2015      Latest Reference Range & Units 08/29/21 08:17 12/25/21 08:45 05/14/22 09:11 09/12/22 09:12 05/16/23 07:59  TSH 0.450 - 4.500 uIU/mL 8.400 (H) 4.290 4.710 (H) 0.849 3.650  T4,Free(Direct) 0.82 - 1.77 ng/dL  0.75 (L) 0.83 0.79 (L) 1.05 1.01  (H): Data is abnormally  high (L): Data is abnormally low  Assessment & Plan:   1) Type 2 diabetes mellitus with stage 2 chronic kidney disease, with long-term current use of insulin (HCC)  - Kimberly Freeman has currently uncontrolled symptomatic type 2 DM since  82 years of age. Recent labs reviewed.  She presents today with her meter and logs showing at goal glycemic profile overall.  Her POCT A1c today is 7.8%, increasing from last visit of 7.5%.  Analysis of her meter shows 7-day average of 123, 14-day average of 126, 30-day average of 111.  She denies any hypoglycemia.  She does note she has cheated on her diet some over the holiday season.   - I had a long discussion with her about the progressive nature of diabetes and the pathology behind its complications. -her diabetes is complicated by stage 2 CKD, obesity /sedentary life, and she remains at a high risk for more acute and chronic complications which include CAD, CVA, CKD, retinopathy, and neuropathy. These are all discussed in detail with her.  - Nutritional counseling repeated at each appointment due to patients tendency to fall back in to old habits.  - The patient admits there is a room for improvement in their diet and drink choices. -  Suggestion is made for the patient to avoid simple carbohydrates from their diet including Cakes, Sweet Desserts / Pastries, Ice Cream, Soda (diet and regular), Sweet Tea, Candies, Chips, Cookies, Sweet Pastries, Store Bought Juices, Alcohol in Excess of 1-2 drinks a day, Artificial Sweeteners, Coffee Creamer, and "Sugar-free" Products. This will help patient to have stable blood glucose profile and potentially avoid unintended weight gain.   - I encouraged the patient to switch to unprocessed or minimally processed complex starch and increased protein intake (animal or plant source), fruits, and vegetables.   - Patient is advised to stick to a routine mealtimes to eat 3 meals a day and avoid unnecessary snacks (to snack  only to correct hypoglycemia).  - I have approached her with the following individualized plan to manage  her diabetes and patient agrees:   - she will continue to need at least basal insulin in order for her to achieve control of diabetes to target.    -Given her stable glycemic profile and lack of hypoglycemia, no changes will be made to her medications today.  She is advised to continue Tresiba 28 units SQ nightly and Glipizide 2.5 mg XL daily with breakfast.  Will give her more time to correct diet before changing regimen.  -She is encouraged to continue monitoring glucose twice daily (consistently), before breakfast and before bed, and to call the clinic if she has readings less than 70 or greater than 300 for 3 tests in a row.  -She will be considered for incretin therapy during her next visit.  - Specific targets for  A1c;  LDL, HDL,  and Triglycerides were discussed with the patient.  2) Blood Pressure /Hypertension:  Her blood pressure is controlled to target for her age.  She is advised to continue Norvasc 10 mg po daily, Irbesartan 300 mg po daily, Metoprolol 100 mg po twice daily, and Aldactone 50 mg po daily.  3) Lipids/Hyperlipidemia:    Her most recent lipid panel from 01/09/23 shows controlled LDL at 73.  She is advised to continue Atorvastatin 80 mg po daily at bedtime.  Side effects and precautions discussed with her.  4)  Weight/Diet:  Her Body mass index is 33.27 kg/m.  -   clearly complicating her diabetes care.   she is  a candidate for weight loss. I discussed with her the fact that loss of 5 - 10% of her  current body weight will have the most impact on her diabetes management.  Exercise, and detailed carbohydrates information provided  -  detailed on discharge instructions.  5) Hypothyroidism- unspecified There are no recent TFTs to review.  She is advised to continue Levothyroxine 50 mcg po daily before breakfast.  Will recheck TFTs prior to next visit and adjust  dose accordingly.   - The correct intake of thyroid hormone (Levothyroxine, Synthroid), is on empty stomach first thing in the morning, with water, separated by at least 30 minutes from breakfast and other medications,  and separated by more than 4 hours from calcium, iron, multivitamins, acid reflux medications (PPIs).  - This medication is a life-long medication and will be needed to correct thyroid hormone imbalances for the rest of your life.  The dose may change from time to time, based on thyroid blood work.  - It is extremely important to be consistent taking this medication, near the same time each morning.  -AVOID TAKING PRODUCTS CONTAINING BIOTIN (commonly found in Hair, Skin, Nails vitamins) AS IT INTERFERES WITH THE VALIDITY OF THYROID FUNCTION BLOOD TESTS.  6) Chronic Care/Health Maintenance: -she is on Statin medications and  is encouraged to initiate and continue to follow up with Ophthalmology, Dentist,  Podiatrist at least yearly or according to recommendations, and advised to stay away from smoking. I have recommended yearly flu vaccine and pneumonia vaccine at least every 5 years; moderate intensity exercise for up to 150 minutes weekly; and  sleep for at least 7 hours a day.  - she is advised to maintain close follow up with [name], MD for primary care needs, as well as her other providers for optimal and coordinated care.      I spent  30  minutes in the care of the patient today including review of labs from CMP, Lipids, Thyroid Function, Hematology (current and previous including abstractions from other facilities); face-to-face time discussing  her blood glucose readings/logs, discussing hypoglycemia and hyperglycemia episodes and symptoms, medications doses, her options of short and long term treatment based on the latest standards of care / guidelines;  discussion about incorporating lifestyle medicine;  and documenting the encounter. Risk reduction  counseling performed per USPSTF guidelines to reduce obesity and cardiovascular risk factors.     Please refer to Patient Instructions for Blood Glucose Monitoring and Insulin/Medications Dosing Guide"  in media tab for additional information. Please  also refer to " Patient Self Inventory" in the Media  tab for reviewed elements of pertinent patient history.  Mauro Kaufmann participated in the discussions, expressed understanding, and voiced agreement with the above plans.  All questions were answered to her satisfaction. she is encouraged to contact clinic should she have any questions or concerns prior to her return visit.   Follow up plan: - Return in about 3 months (around 12/23/2023) for Diabetes F/U with A1c in office, Thyroid follow up, Previsit labs, Bring meter and logs.  Ronny Bacon, [facility] [address] [place], [place] [address] Phone: [phone] Fax: [phone]  09/24/2023, 9:47 AM

## 2023-09-26 DIAGNOSIS — B351 Tinea unguium: Secondary | ICD-10-CM | POA: Diagnosis not present

## 2023-09-26 DIAGNOSIS — E1142 Type 2 diabetes mellitus with diabetic polyneuropathy: Secondary | ICD-10-CM | POA: Diagnosis not present

## 2023-10-01 DIAGNOSIS — E119 Type 2 diabetes mellitus without complications: Secondary | ICD-10-CM | POA: Diagnosis not present

## 2023-10-01 LAB — HM DIABETES EYE EXAM

## 2023-10-08 ENCOUNTER — Other Ambulatory Visit: Payer: Self-pay | Admitting: Cardiology

## 2023-10-11 ENCOUNTER — Ambulatory Visit: Payer: Medicare Other | Admitting: Family Medicine

## 2023-11-06 ENCOUNTER — Ambulatory Visit: Payer: Medicare Other | Admitting: Orthopaedic Surgery

## 2023-11-06 ENCOUNTER — Encounter: Payer: Self-pay | Admitting: Orthopaedic Surgery

## 2023-11-06 VITALS — BP 165/70 | HR 62

## 2023-11-06 DIAGNOSIS — G8929 Other chronic pain: Secondary | ICD-10-CM | POA: Diagnosis not present

## 2023-11-06 DIAGNOSIS — M25561 Pain in right knee: Secondary | ICD-10-CM | POA: Diagnosis not present

## 2023-11-06 MED ORDER — METHYLPREDNISOLONE ACETATE 40 MG/ML IJ SUSP
40.0000 mg | Freq: Once | INTRAMUSCULAR | Status: AC
  Administered 2023-11-06: 40 mg via INTRA_ARTICULAR

## 2023-11-06 NOTE — Progress Notes (Signed)
 PROCEDURE NOTE:  The patient requests injections of the right knee , verbal consent was obtained.  The right knee was prepped appropriately after time out was performed.   Sterile technique was observed and injection of 1 cc of DepoMedrol 40mg  with several cc's of plain xylocaine . Anesthesia was provided by ethyl chloride and a 20-gauge needle was used to inject the knee area. The injection was tolerated well.  A band aid dressing was applied.  The patient was advised to apply ice later today and tomorrow to the injection sight as needed.  Encounter Diagnosis  Name Primary?   Chronic pain of right knee Yes   Return prn.  Call if any problem.  Precautions discussed.  Electronically Signed Pleasant Brilliant, MD 1/15/20259:35 AM

## 2023-11-18 ENCOUNTER — Telehealth: Payer: Self-pay | Admitting: Family Medicine

## 2023-11-18 NOTE — Telephone Encounter (Signed)
Patient called needs new order for blood work, contact patient to let her know when to have this done.

## 2023-11-19 ENCOUNTER — Other Ambulatory Visit: Payer: Self-pay

## 2023-11-19 DIAGNOSIS — E785 Hyperlipidemia, unspecified: Secondary | ICD-10-CM

## 2023-11-19 DIAGNOSIS — E559 Vitamin D deficiency, unspecified: Secondary | ICD-10-CM

## 2023-11-19 DIAGNOSIS — I10 Essential (primary) hypertension: Secondary | ICD-10-CM

## 2023-11-19 DIAGNOSIS — E039 Hypothyroidism, unspecified: Secondary | ICD-10-CM

## 2023-11-19 DIAGNOSIS — N182 Chronic kidney disease, stage 2 (mild): Secondary | ICD-10-CM

## 2023-11-19 NOTE — Telephone Encounter (Signed)
Patient aware.

## 2023-11-20 DIAGNOSIS — I10 Essential (primary) hypertension: Secondary | ICD-10-CM | POA: Diagnosis not present

## 2023-11-20 DIAGNOSIS — E785 Hyperlipidemia, unspecified: Secondary | ICD-10-CM | POA: Diagnosis not present

## 2023-11-20 DIAGNOSIS — E1122 Type 2 diabetes mellitus with diabetic chronic kidney disease: Secondary | ICD-10-CM | POA: Diagnosis not present

## 2023-11-20 DIAGNOSIS — N182 Chronic kidney disease, stage 2 (mild): Secondary | ICD-10-CM | POA: Diagnosis not present

## 2023-11-20 DIAGNOSIS — Z794 Long term (current) use of insulin: Secondary | ICD-10-CM | POA: Diagnosis not present

## 2023-11-20 DIAGNOSIS — E039 Hypothyroidism, unspecified: Secondary | ICD-10-CM | POA: Diagnosis not present

## 2023-11-20 DIAGNOSIS — E559 Vitamin D deficiency, unspecified: Secondary | ICD-10-CM | POA: Diagnosis not present

## 2023-11-21 ENCOUNTER — Encounter: Payer: Self-pay | Admitting: Family Medicine

## 2023-11-21 ENCOUNTER — Ambulatory Visit: Payer: Medicare Other | Admitting: Family Medicine

## 2023-11-21 VITALS — BP 146/75 | HR 65 | Ht 67.0 in | Wt 209.1 lb

## 2023-11-21 DIAGNOSIS — F439 Reaction to severe stress, unspecified: Secondary | ICD-10-CM | POA: Diagnosis not present

## 2023-11-21 DIAGNOSIS — E669 Obesity, unspecified: Secondary | ICD-10-CM | POA: Diagnosis not present

## 2023-11-21 DIAGNOSIS — E785 Hyperlipidemia, unspecified: Secondary | ICD-10-CM

## 2023-11-21 DIAGNOSIS — E559 Vitamin D deficiency, unspecified: Secondary | ICD-10-CM | POA: Diagnosis not present

## 2023-11-21 DIAGNOSIS — E1165 Type 2 diabetes mellitus with hyperglycemia: Secondary | ICD-10-CM

## 2023-11-21 DIAGNOSIS — I10 Essential (primary) hypertension: Secondary | ICD-10-CM | POA: Diagnosis not present

## 2023-11-21 DIAGNOSIS — N1831 Chronic kidney disease, stage 3a: Secondary | ICD-10-CM

## 2023-11-21 DIAGNOSIS — Z794 Long term (current) use of insulin: Secondary | ICD-10-CM

## 2023-11-21 DIAGNOSIS — E039 Hypothyroidism, unspecified: Secondary | ICD-10-CM | POA: Diagnosis not present

## 2023-11-21 DIAGNOSIS — D649 Anemia, unspecified: Secondary | ICD-10-CM

## 2023-11-21 LAB — CMP14+EGFR
ALT: 20 [IU]/L (ref 0–32)
AST: 19 [IU]/L (ref 0–40)
Albumin: 4.1 g/dL (ref 3.7–4.7)
Alkaline Phosphatase: 106 [IU]/L (ref 44–121)
BUN/Creatinine Ratio: 17 (ref 12–28)
BUN: 24 mg/dL (ref 8–27)
Bilirubin Total: 0.3 mg/dL (ref 0.0–1.2)
CO2: 24 mmol/L (ref 20–29)
Calcium: 9.8 mg/dL (ref 8.7–10.3)
Chloride: 108 mmol/L — ABNORMAL HIGH (ref 96–106)
Creatinine, Ser: 1.38 mg/dL — ABNORMAL HIGH (ref 0.57–1.00)
Globulin, Total: 2.4 g/dL (ref 1.5–4.5)
Glucose: 84 mg/dL (ref 70–99)
Potassium: 4.4 mmol/L (ref 3.5–5.2)
Sodium: 147 mmol/L — ABNORMAL HIGH (ref 134–144)
Total Protein: 6.5 g/dL (ref 6.0–8.5)
eGFR: 38 mL/min/{1.73_m2} — ABNORMAL LOW (ref >59)

## 2023-11-21 LAB — VITAMIN D 25 HYDROXY (VIT D DEFICIENCY, FRACTURES): Vit D, 25-Hydroxy: 49.2 ng/mL (ref 30.0–100.0)

## 2023-11-21 LAB — CBC
Hematocrit: 35.2 % (ref 34.0–46.6)
Hemoglobin: 11.4 g/dL (ref 11.1–15.9)
MCH: 30.9 pg (ref 26.6–33.0)
MCHC: 32.4 g/dL (ref 31.5–35.7)
MCV: 95 fL (ref 79–97)
Platelets: 208 10*3/uL (ref 150–450)
RBC: 3.69 x10E6/uL — ABNORMAL LOW (ref 3.77–5.28)
RDW: 12.1 % (ref 11.7–15.4)
WBC: 8.6 10*3/uL (ref 3.4–10.8)

## 2023-11-21 LAB — LIPID PANEL
Chol/HDL Ratio: 3.2 {ratio} (ref 0.0–4.4)
Cholesterol, Total: 120 mg/dL (ref 100–199)
HDL: 38 mg/dL — ABNORMAL LOW (ref >39)
LDL Chol Calc (NIH): 65 mg/dL (ref 0–99)
Triglycerides: 87 mg/dL (ref 0–149)
VLDL Cholesterol Cal: 17 mg/dL (ref 5–40)

## 2023-11-21 LAB — TSH: TSH: 2.53 u[IU]/mL (ref 0.450–4.500)

## 2023-11-21 LAB — MICROALBUMIN / CREATININE URINE RATIO
Creatinine, Urine: 61.7 mg/dL
Microalb/Creat Ratio: 108 mg/g{creat} — ABNORMAL HIGH (ref 0–29)
Microalbumin, Urine: 66.7 ug/mL

## 2023-11-21 MED ORDER — CARVEDILOL 3.125 MG PO TABS: 3.1250 mg | ORAL_TABLET | Freq: Two times a day (BID) | ORAL | 3 refills | Status: DC

## 2023-11-21 NOTE — Patient Instructions (Addendum)
F/U in 8 to 10 weeks, re evaluate blood pressure, call if you need me sooner  New additional medication for blood pressure is carvedilol 3.125 mg one twice daily   Please schedule wellness at checkout  Iron, ferritin, TIBC, B12, folate, methylmalonic acid to be drawn 3 to 5 days before f/u dx is anemia, also non fasting chem 7 and EGFR, dx is CKD  Need vaccines, covid, shingrix and TdAP  Thanks for choosing [facility], we consider it a privelige to serve you.

## 2023-11-21 NOTE — Assessment & Plan Note (Signed)
Stable , improved blood pressure and blood sugar control are current goals, not being managed by Nephrology at this time, pt preference primariy

## 2023-11-21 NOTE — Assessment & Plan Note (Signed)
Diabetes associated with hypertension, hyperlipidemia, CKD, and heart disease  Kimberly Freeman is reminded of the importance of commitment to daily physical activity for 30 minutes or more, as able and the need to limit carbohydrate intake to 30 to 60 grams per meal to help with blood sugar control.   The need to take medication as prescribed, test blood sugar as directed, and to call between visits if there is a concern that blood sugar is uncontrolled is also discussed.   Kimberly Freeman is reminded of the importance of daily foot exam, annual eye examination, and good blood sugar, blood pressure and cholesterol control.     Latest Ref Rng & Units 11/20/2023    9:01 AM 09/24/2023    9:40 AM 05/21/2023    9:46 AM 05/16/2023    7:59 AM 04/15/2023    9:15 AM  Diabetic Labs  HbA1c 4.0 - 5.6 %  7.8  7.5     Micro/Creat Ratio  WILL FOLLOW  P      Chol 100 - 199 mg/dL 956       HDL >21 mg/dL 38       Calc LDL 0 - 99 mg/dL 65       Triglycerides 0 - 149 mg/dL 87       Creatinine 3.08 - 1.00 mg/dL 6.57    8.46  9.62     P Preliminary result      11/21/2023   10:27 AM 11/21/2023   10:26 AM 11/06/2023    9:14 AM 09/24/2023    9:23 AM 06/20/2023   10:13 AM 06/20/2023   10:11 AM 05/21/2023    9:34 AM  BP/Weight  Systolic BP 146 146 165 138 148 140 142  Diastolic BP 75 75 70 68 54 58 60  Wt. (Lbs)  209.12  212.4  205.8   BMI  32.75 kg/m2  33.27 kg/m2  32.23 kg/m2       Latest Ref Rng & Units 10/01/2023   12:00 AM 09/27/2022   12:00 AM  Foot/eye exam completion dates  Eye Exam No Retinopathy No Retinopathy     No Retinopathy         This result is from an external source.      Managed by endo, fairly well controlled

## 2023-11-21 NOTE — Progress Notes (Signed)
Kimberly Freeman     MRN: [id number]      DOB: 1941-08-27  Chief Complaint  Patient presents with   Follow-up    Follow up    HPI Kimberly Freeman is here for follow up and re-evaluation of chronic medical conditions, medication management and review of any available recent lab and radiology data.  Preventive health is updated, specifically  Cancer screening and Immunization.   Questions or concerns regarding consultations or procedures which the PT has had in the interim are  addressed.Wants anemia followed by PCP [facility] message hematology, this is reasonable so labs ordered for her next visit here Denies polyuria, polydipsia, blurred vision , or hypoglycemic episodes. Increased stress and responsibility with spouse with severe dementia, does get help from friends and neighbors The PT denies any adverse reactions to current medications since the last visit.   ROS Denies recent fever or chills. Denies sinus pressure, nasal congestion, ear pain or sore throat. Denies chest congestion, productive cough or wheezing. Denies chest pains, palpitations and leg swelling Denies abdominal pain, nausea, vomiting,diarrhea or constipation.   Denies dysuria, frequency, hesitancy or incontinence. Denies uncontrolled  joint pain, swelling and limitation in mobility. Denies headaches, seizures, numbness, or tingling. Denies depression, anxiety or insomnia. Denies skin break down or rash.   PE  BP (!) 146/75 (BP Location: Left Arm, Patient Position: Sitting, Cuff Size: Large)   Pulse 65   Ht 5\' 7"  (1.702 m)   Wt 209 lb 1.9 oz (94.9 kg)   SpO2 92%   BMI 32.75 kg/m   Patient alert and oriented and in no cardiopulmonary distress.  HEENT: No facial asymmetry, EOMI,     Neck supple .  Chest: Clear to auscultation bilaterally.  CVS: S1, S2 no murmurs, no S3.Regular rate.  ABD: Soft non tender.   Ext: No edema  MS: Adequate though reduced  ROM spine, shoulders, hips and knees.  Skin: Intact, no  ulcerations or rash noted.  Psych: Good eye contact, normal affect. Memory intact not anxious or depressed appearing.  CNS: CN 2-12 intact, power,  normal throughout.no focal deficits noted.   Assessment & Plan  Malignant hypertension Uncontrolled add coreg 3.125 mg twice daily and reassess DASH diet and commitment to daily physical activity for a minimum of 30 minutes discussed and encouraged, as a part of hypertension management. The importance of attaining a healthy weight is also discussed.     11/21/2023   10:27 AM 11/21/2023   10:26 AM 11/06/2023    9:14 AM 09/24/2023    9:23 AM 06/20/2023   10:13 AM 06/20/2023   10:11 AM 05/21/2023    9:34 AM  BP/Weight  Systolic BP 146 146 165 138 148 140 142  Diastolic BP 75 75 70 68 54 58 60  Wt. (Lbs)  209.12  212.4  205.8   BMI  32.75 kg/m2  33.27 kg/m2  32.23 kg/m2        Uncontrolled type 2 diabetes mellitus with hyperglycemia, with long-term current use of insulin (HCC) Diabetes associated with hypertension, hyperlipidemia, CKD, and heart disease  Kimberly Freeman is reminded of the importance of commitment to daily physical activity for 30 minutes or more, as able and the need to limit carbohydrate intake to 30 to 60 grams per meal to help with blood sugar control.   The need to take medication as prescribed, test blood sugar as directed, and to call between visits if there is a concern that blood sugar is uncontrolled is  also discussed.   Kimberly Freeman is reminded of the importance of daily foot exam, annual eye examination, and good blood sugar, blood pressure and cholesterol control.     Latest Ref Rng & Units 11/20/2023    9:01 AM 09/24/2023    9:40 AM 05/21/2023    9:46 AM 05/16/2023    7:59 AM 04/15/2023    9:15 AM  Diabetic Labs  HbA1c 4.0 - 5.6 %  7.8  7.5     Micro/Creat Ratio  WILL FOLLOW  P      Chol 100 - 199 mg/dL 409       HDL >81 mg/dL 38       Calc LDL 0 - 99 mg/dL 65       Triglycerides 0 - 149 mg/dL 87        Creatinine 1.91 - 1.00 mg/dL 4.78    2.95  6.21     P Preliminary result      11/21/2023   10:27 AM 11/21/2023   10:26 AM 11/06/2023    9:14 AM 09/24/2023    9:23 AM 06/20/2023   10:13 AM 06/20/2023   10:11 AM 05/21/2023    9:34 AM  BP/Weight  Systolic BP 146 146 165 138 148 140 142  Diastolic BP 75 75 70 68 54 58 60  Wt. (Lbs)  209.12  212.4  205.8   BMI  32.75 kg/m2  33.27 kg/m2  32.23 kg/m2       Latest Ref Rng & Units 10/01/2023   12:00 AM 09/27/2022   12:00 AM  Foot/eye exam completion dates  Eye Exam No Retinopathy No Retinopathy     No Retinopathy         This result is from an external source.      Managed by endo, fairly well controlled  Stress at home Increasing with worsening health iof spouse , coping fairly well, getting more help f  Stage 3a chronic kidney disease (CKD) (HCC) Stable , improved blood pressure and blood sugar control are current goals, not being managed by Nephrology at this time, pt preference primariy  Obesity (BMI 30-39.9)  Patient re-educated about  the importance of commitment to a  minimum of 150 minutes of exercise per week as able.  The importance of healthy food choices with portion control discussed, as well as eating regularly and within a 12 hour window most days. The need to choose "clean , green" food 50 to 75% of the time is discussed, as well as to make water the primary drink and set a goal of 64 ounces water daily.       11/21/2023   10:26 AM 09/24/2023    9:23 AM 06/20/2023   10:11 AM  Weight /BMI  Weight 209 lb 1.9 oz 212 lb 6.4 oz 205 lb 12.8 oz  Height 5\' 7"  (1.702 m) 5\' 7"  (1.702 m) 5\' 7"  (1.702 m)  BMI 32.75 kg/m2 33.27 kg/m2 32.23 kg/m2      Hypothyroid Continue  current med dose  Anemia CKD primary reason. Hematology has annual f/u with pre visit lab. Pt requests PCP follow unless there is a new concern   I think this is reasonable, labs ordered for next visit, amd will reach out to hematology also

## 2023-11-21 NOTE — Assessment & Plan Note (Signed)
  Patient re-educated about  the importance of commitment to a  minimum of 150 minutes of exercise per week as able.  The importance of healthy food choices with portion control discussed, as well as eating regularly and within a 12 hour window most days. The need to choose "clean , green" food 50 to 75% of the time is discussed, as well as to make water the primary drink and set a goal of 64 ounces water daily.       11/21/2023   10:26 AM 09/24/2023    9:23 AM 06/20/2023   10:11 AM  Weight /BMI  Weight 209 lb 1.9 oz 212 lb 6.4 oz 205 lb 12.8 oz  Height 5\' 7"  (1.702 m) 5\' 7"  (1.702 m) 5\' 7"  (1.702 m)  BMI 32.75 kg/m2 33.27 kg/m2 32.23 kg/m2

## 2023-11-21 NOTE — Assessment & Plan Note (Signed)
Increasing with worsening health iof spouse , coping fairly well, getting more help f

## 2023-11-21 NOTE — Assessment & Plan Note (Signed)
CKD primary reason. Hematology has annual f/u with pre visit lab. Pt requests PCP follow unless there is a new concern   I think this is reasonable, labs ordered for next visit, amd will reach out to hematology also

## 2023-11-21 NOTE — Assessment & Plan Note (Signed)
Uncontrolled add coreg 3.125 mg twice daily and reassess DASH diet and commitment to daily physical activity for a minimum of 30 minutes discussed and encouraged, as a part of hypertension management. The importance of attaining a healthy weight is also discussed.     11/21/2023   10:27 AM 11/21/2023   10:26 AM 11/06/2023    9:14 AM 09/24/2023    9:23 AM 06/20/2023   10:13 AM 06/20/2023   10:11 AM 05/21/2023    9:34 AM  BP/Weight  Systolic BP 146 146 165 138 148 140 142  Diastolic BP 75 75 70 68 54 58 60  Wt. (Lbs)  209.12  212.4  205.8   BMI  32.75 kg/m2  33.27 kg/m2  32.23 kg/m2

## 2023-11-21 NOTE — Assessment & Plan Note (Signed)
Continue current med dose

## 2023-11-29 ENCOUNTER — Other Ambulatory Visit: Payer: Self-pay | Admitting: Family Medicine

## 2023-12-04 ENCOUNTER — Other Ambulatory Visit: Payer: Self-pay | Admitting: Family Medicine

## 2023-12-05 DIAGNOSIS — B351 Tinea unguium: Secondary | ICD-10-CM | POA: Diagnosis not present

## 2023-12-05 DIAGNOSIS — E1142 Type 2 diabetes mellitus with diabetic polyneuropathy: Secondary | ICD-10-CM | POA: Diagnosis not present

## 2023-12-09 ENCOUNTER — Other Ambulatory Visit: Payer: Self-pay | Admitting: Family Medicine

## 2023-12-26 DIAGNOSIS — E1122 Type 2 diabetes mellitus with diabetic chronic kidney disease: Secondary | ICD-10-CM | POA: Diagnosis not present

## 2023-12-26 DIAGNOSIS — Z794 Long term (current) use of insulin: Secondary | ICD-10-CM | POA: Diagnosis not present

## 2023-12-26 DIAGNOSIS — E039 Hypothyroidism, unspecified: Secondary | ICD-10-CM | POA: Diagnosis not present

## 2023-12-26 DIAGNOSIS — N1831 Chronic kidney disease, stage 3a: Secondary | ICD-10-CM | POA: Diagnosis not present

## 2023-12-27 LAB — COMPREHENSIVE METABOLIC PANEL
ALT: 18 IU/L (ref 0–32)
AST: 18 IU/L (ref 0–40)
Albumin: 4.4 g/dL (ref 3.7–4.7)
Alkaline Phosphatase: 108 IU/L (ref 44–121)
BUN/Creatinine Ratio: 17 (ref 12–28)
BUN: 23 mg/dL (ref 8–27)
Bilirubin Total: 0.3 mg/dL (ref 0.0–1.2)
CO2: 26 mmol/L (ref 20–29)
Calcium: 10.2 mg/dL (ref 8.7–10.3)
Chloride: 105 mmol/L (ref 96–106)
Creatinine, Ser: 1.34 mg/dL — ABNORMAL HIGH (ref 0.57–1.00)
Globulin, Total: 2.5 g/dL (ref 1.5–4.5)
Glucose: 82 mg/dL (ref 70–99)
Potassium: 4.3 mmol/L (ref 3.5–5.2)
Sodium: 145 mmol/L — ABNORMAL HIGH (ref 134–144)
Total Protein: 6.9 g/dL (ref 6.0–8.5)
eGFR: 39 mL/min/{1.73_m2} — ABNORMAL LOW (ref >59)

## 2023-12-27 LAB — TSH: TSH: 3.07 u[IU]/mL (ref 0.450–4.500)

## 2023-12-27 LAB — T4, FREE: Free T4: 1.03 ng/dL (ref 0.82–1.77)

## 2024-01-02 ENCOUNTER — Encounter: Payer: Self-pay | Admitting: Nurse Practitioner

## 2024-01-02 ENCOUNTER — Ambulatory Visit (INDEPENDENT_AMBULATORY_CARE_PROVIDER_SITE_OTHER): Payer: Medicare Other | Admitting: Nurse Practitioner

## 2024-01-02 VITALS — BP 140/70 | HR 58 | Ht 67.0 in | Wt 212.2 lb

## 2024-01-02 DIAGNOSIS — E1122 Type 2 diabetes mellitus with diabetic chronic kidney disease: Secondary | ICD-10-CM

## 2024-01-02 DIAGNOSIS — Z794 Long term (current) use of insulin: Secondary | ICD-10-CM | POA: Diagnosis not present

## 2024-01-02 DIAGNOSIS — Z7984 Long term (current) use of oral hypoglycemic drugs: Secondary | ICD-10-CM

## 2024-01-02 DIAGNOSIS — N1831 Chronic kidney disease, stage 3a: Secondary | ICD-10-CM | POA: Diagnosis not present

## 2024-01-02 DIAGNOSIS — E782 Mixed hyperlipidemia: Secondary | ICD-10-CM | POA: Diagnosis not present

## 2024-01-02 DIAGNOSIS — E559 Vitamin D deficiency, unspecified: Secondary | ICD-10-CM

## 2024-01-02 DIAGNOSIS — E039 Hypothyroidism, unspecified: Secondary | ICD-10-CM | POA: Diagnosis not present

## 2024-01-02 DIAGNOSIS — I10 Essential (primary) hypertension: Secondary | ICD-10-CM

## 2024-01-02 LAB — POCT GLYCOSYLATED HEMOGLOBIN (HGB A1C): Hemoglobin A1C: 7.6 % — AB (ref 4.0–5.6)

## 2024-01-02 NOTE — Progress Notes (Signed)
 01/02/2024, 8:40 AM  Endocrinology follow-up note  Subjective:    Patient ID: Kimberly Freeman, female    DOB: [birth year].  MARWAH DISBRO is being seen in consultation for management of currently uncontrolled symptomatic diabetes requested by  [name], MD.   Past Medical History:  Diagnosis Date   Arthritis    CAD (coronary artery disease) 08/2019   60% RI with other 20-30% lesions at cath   Diabetes mellitus, type 2 (HCC)    Essential hypertension    GERD (gastroesophageal reflux disease)    Hyperlipidemia    LVH (left ventricular hypertrophy)     Past Surgical History:  Procedure Laterality Date   ABDOMINAL HYSTERECTOMY     BIOPSY  07/18/2017   Procedure: BIOPSY;  Surgeon: [name], MD;  Location: [facility];  Service: Endoscopy;;  gastric   CHOLECYSTECTOMY  2004   COLONOSCOPY N/A 07/18/2017   Dr. [name]: Internal and external hemorrhoids.  No future screening/surveillance colonoscopies due to age.   ESOPHAGOGASTRODUODENOSCOPY N/A 07/18/2017   Dr. [name]: Small hiatal hernia, patchy mild inflammation characterized by congestion, erosions, erythema in the cardia, gastric body and antrum.  Diffuse moderate inflammation in the duodenal bulb and second portion duodenum.  Biopsies benign.  No H. pylori.   LEFT HEART CATH AND CORONARY ANGIOGRAPHY N/A 09/21/2019   Procedure: LEFT HEART CATH AND CORONARY ANGIOGRAPHY;  Surgeon: [name], MD;  Location: [facility];  Service: Cardiovascular;  Laterality: N/A;    Social History   Socioeconomic History   Marital status: Married    Spouse name: [name]    Number of children: 0   Years of education: 12+   Highest education level: 12th grade  Occupational History   Occupation: retired   Tobacco Use   Smoking status: Never   Smokeless tobacco: Never  Vaping Use   Vaping status: Never Used  Substance and Sexual Activity    Alcohol use: No    Alcohol/week: 0.0 standard drinks of alcohol   Drug use: No   Sexual activity: Not on file  Other Topics Concern   Not on file  Social History Narrative   Lives alone with husband    Social Drivers of Health   Financial Resource Strain: Low Risk  (09/30/2021)   Overall Financial Resource Strain (CARDIA)    Difficulty of Paying Living Expenses: Not hard at all  Food Insecurity: No Food Insecurity (09/30/2021)   Hunger Vital Sign    Worried About Running Out of Food in the Last Year: Never true    Ran Out of Food in the Last Year: Never true  Transportation Needs: No Transportation Needs (09/30/2021)   PRAPARE - Administrator, Civil Service (Medical): No    Lack of Transportation (Non-Medical): No  Physical Activity: Insufficiently Active (09/30/2021)   Exercise Vital Sign    Days of Exercise per Week: 3 days    Minutes of Exercise per Session: 30 min  Stress: No Stress Concern Present (09/30/2021)   [employer] of Occupational Health - Occupational Stress Questionnaire    Feeling of Stress : Not at all  Social Connections: Socially Integrated (  09/30/2021)   Social Connection and Isolation Panel [NHANES]    Frequency of Communication with Friends and Family: More than three times a week    Frequency of Social Gatherings with Friends and Family: More than three times a week    Attends Religious Services: More than 4 times per year    Active Member of [employer] or Organizations: Yes    Attends Engineer, structural: More than 4 times per year    Marital Status: Married    Family History  Problem Relation Age of Onset   Leukemia Mother    Cancer Mother    Diabetes Mother    Prostate cancer Father    Lung cancer Sister 48   Diabetes Sister    Colon cancer Neg Hx     Outpatient Encounter Medications as of 01/02/2024  Medication Sig   acetaminophen (TYLENOL) 500 MG tablet Take 500 mg by mouth daily as needed for mild pain or moderate  pain. For pain   amLODipine (NORVASC) 10 MG tablet TAKE 1 TABLET DAILY (DOSE INCREASE)   aspirin 81 MG chewable tablet Chew 1 tablet (81 mg total) by mouth daily.   atorvastatin (LIPITOR) 80 MG tablet TAKE 1 TABLET DAILY AT 6 P.M.   BD PEN NEEDLE MICRO U/F 32G X 6 MM MISC USE TO INJECT INSULIN DAILY   betamethasone dipropionate 0.05 % cream Apply topically 2 (two) times daily.   calcium-vitamin D (OSCAL WITH D) 500-200 MG-UNIT tablet Take 1 tablet by mouth.   carvedilol (COREG) 3.125 MG tablet Take 1 tablet (3.125 mg total) by mouth 2 (two) times daily with a meal.   Easy Touch Lancets 28G MISC USE TO TEST TWICE A DAY   glipiZIDE (GLUCOTROL XL) 2.5 MG 24 hr tablet Take 1 tablet (2.5 mg total) by mouth daily with breakfast.   glucose blood (ONETOUCH ULTRA) test strip Use as instructed   insulin degludec (TRESIBA FLEXTOUCH) 100 UNIT/ML FlexTouch Pen Inject 28 Units into the skin at bedtime.   irbesartan (AVAPRO) 300 MG tablet TAKE 1 TABLET DAILY   Lancets (ONETOUCH ULTRASOFT) lancets USE AS DIRECTED TWICE DAILY   levothyroxine (SYNTHROID) 50 MCG tablet Take 1 tablet (50 mcg total) by mouth daily before breakfast.   metoprolol tartrate (LOPRESSOR) 100 MG tablet TAKE 1 TABLET TWICE A DAY   Multiple Vitamin (MULTIVITAMIN WITH MINERALS) TABS tablet Take 1 tablet by mouth daily.   pantoprazole (PROTONIX) 40 MG tablet Take 40 mg by mouth daily.   polyethylene glycol (MIRALAX / GLYCOLAX) 17 g packet Take 17 g by mouth daily as needed.   spironolactone (ALDACTONE) 50 MG tablet Take 1 tablet (50 mg total) by mouth 2 (two) times daily.   spironolactone (ALDACTONE) 50 MG tablet Take 1 tablet (50 mg total) by mouth 2 (two) times daily.   No facility-administered encounter medications on file as of 01/02/2024.    ALLERGIES: Allergies  Allergen Reactions   Poison Oak Extract [Poison Oak Extract] Hives   Sulfonamide Derivatives Itching    Burning sensation all over body   Levemir [Insulin Detemir] Rash     VACCINATION STATUS: Immunization History  Administered Date(s) Administered   Fluad Quad(high Dose 65+) 07/27/2019, 06/23/2020, 06/29/2021, 08/01/2022   Fluad Trivalent(High Dose 65+) 07/16/2023   Influenza Split 08/01/2011, 07/31/2012   Influenza Whole 07/29/2007, 07/20/2008, 07/21/2009, 07/19/2010   Influenza, High Dose Seasonal PF 07/23/2018   Influenza,inj,Quad PF,6+ Mos 08/07/2013, 06/15/2014, 07/19/2015, 06/28/2016, 07/25/2017   Moderna SARS-COV2 Booster Vaccination 07/26/2021   Moderna Sars-Covid-2  Vaccination 12/22/2019, 01/19/2020, 09/25/2020, 01/29/2021   Pneumococcal Conjugate-13 10/12/2014   Pneumococcal Polysaccharide-23 03/16/2004, 08/29/2012   Td 03/16/2004   Tdap 03/31/2012   Zoster, Live 01/02/2007    Diabetes She presents for her follow-up diabetic visit. She has type 2 diabetes mellitus. Onset time: She was diagnosed at approximate age of 63 years. Her disease course has been improving. There are no hypoglycemic associated symptoms. Pertinent negatives for hypoglycemia include no confusion, headaches, pallor or seizures. Associated symptoms include fatigue. Pertinent negatives for diabetes include no chest pain, no polydipsia, no polyphagia and no polyuria. There are no hypoglycemic complications. Symptoms are stable. Diabetic complications include heart disease and nephropathy. Risk factors for coronary artery disease include diabetes mellitus, dyslipidemia, family history, hypertension, obesity, sedentary lifestyle and post-menopausal. Current diabetic treatment includes insulin injections and oral agent (monotherapy). She is compliant with treatment most of the time. Her weight is fluctuating minimally. She is following a generally healthy diet. When asked about meal planning, she reported none. She has had a previous visit with a dietitian. She participates in exercise intermittently. Her home blood glucose trend is fluctuating minimally. Her breakfast blood glucose  range is generally 110-130 mg/dl. Her overall blood glucose range is 110-130 mg/dl. (She presents today with her meter and logs showing at goal glycemic profile overall.  Her POCT A1c today is 7.6%, improving from last visit of 7.8%.  Analysis of her meter shows 7-day average of 106, 14-day average of 109, 30-day average of 117.  She denies any hypoglycemia. ) An ACE inhibitor/angiotensin II receptor blocker is not being taken. She sees a podiatrist.Eye exam is current.  Hyperlipidemia This is a chronic problem. The current episode started more than 1 year ago. The problem is controlled. Recent lipid tests were reviewed and are normal. Exacerbating diseases include chronic renal disease, diabetes and obesity. Factors aggravating her hyperlipidemia include beta blockers. Pertinent negatives include no chest pain, myalgias or shortness of breath. Current antihyperlipidemic treatment includes statins. The current treatment provides mild improvement of lipids. There are no compliance problems.  Risk factors for coronary artery disease include diabetes mellitus, dyslipidemia, obesity, hypertension, a sedentary lifestyle and post-menopausal.  Hypertension This is a chronic problem. The current episode started more than 1 year ago. The problem has been resolved since onset. The problem is controlled. Pertinent negatives include no chest pain, headaches, palpitations or shortness of breath. There are no associated agents to hypertension. Risk factors for coronary artery disease include sedentary lifestyle, obesity, post-menopausal state, diabetes mellitus and dyslipidemia. Past treatments include calcium channel blockers, beta blockers and diuretics. The current treatment provides mild improvement. There are no compliance problems.  Hypertensive end-organ damage includes kidney disease and CAD/MI. Identifiable causes of hypertension include chronic renal disease.    Review of systems  Constitutional: + Minimally  fluctuating body weight,  current Body mass index is 33.24 kg/m. , no fatigue, no subjective hyperthermia, no subjective hypothermia Eyes: no blurry vision, no xerophthalmia ENT: no sore throat, no nodules palpated in throat, no dysphagia/odynophagia, no hoarseness Cardiovascular: no chest pain, no shortness of breath, no palpitations, no leg swelling Respiratory: no cough, no shortness of breath Gastrointestinal: no nausea/vomiting/diarrhea Musculoskeletal: no muscle/joint aches Skin: no rashes, no hyperemia Neurological: no tremors, no numbness, no tingling, no dizziness Psychiatric: no depression, no anxiety   Objective:    BP (!) 140/70 (BP Location: Right Arm, Patient Position: Sitting, Cuff Size: Large) Comment: Patient just took her BP medication  Pulse (!) 58   Ht 5'  7" (1.702 m)   Wt 212 lb 3.2 oz (96.3 kg)   BMI 33.24 kg/m   Wt Readings from Last 3 Encounters:  01/02/24 212 lb 3.2 oz (96.3 kg)  11/21/23 209 lb 1.9 oz (94.9 kg)  09/24/23 212 lb 6.4 oz (96.3 kg)    BP Readings from Last 3 Encounters:  01/02/24 (!) 140/70  11/21/23 (!) 146/75  11/06/23 (!) 165/70     Physical Exam- Limited  Constitutional:  Body mass index is 33.24 kg/m. , not in acute distress, normal state of mind Eyes:  EOMI, no exophthalmos Musculoskeletal: no gross deformities, strength intact in all four extremities, no gross restriction of joint movements Skin:  no rashes, no hyperemia Neurological: no tremor with outstretched hands  Diabetic Foot Exam - Simple   No data filed      CMP     Component Value Date/Time   NA 145 (H) 12/26/2023 0854   K 4.3 12/26/2023 0854   CL 105 12/26/2023 0854   CO2 26 12/26/2023 0854   GLUCOSE 82 12/26/2023 0854   GLUCOSE 128 (H) 02/01/2023 1259   BUN 23 12/26/2023 0854   CREATININE 1.34 (H) 12/26/2023 0854   CREATININE 1.14 (H) 12/15/2019 1048   CALCIUM 10.2 12/26/2023 0854   PROT 6.9 12/26/2023 0854   ALBUMIN 4.4 12/26/2023 0854   AST 18  12/26/2023 0854   ALT 18 12/26/2023 0854   ALKPHOS 108 12/26/2023 0854   BILITOT 0.3 12/26/2023 0854   GFRNONAA 50 (L) 02/01/2023 1259   GFRNONAA 45 (L) 12/15/2019 1048   GFRAA 48 (L) 09/05/2020 0810   GFRAA 53 (L) 12/15/2019 1048     Diabetic Labs (most recent): Lab Results  Component Value Date   HGBA1C 7.6 (A) 01/02/2024   HGBA1C 7.8 (A) 09/24/2023   HGBA1C 7.5 (A) 05/21/2023   MICROALBUR 1.5 07/24/2019   MICROALBUR 0.7 04/02/2018   MICROALBUR 8.8 (H) 11/27/2017     Lipid Panel ( most recent) Lipid Panel     Component Value Date/Time   CHOL 120 11/20/2023 0901   TRIG 87 11/20/2023 0901   HDL 38 (L) 11/20/2023 0901   CHOLHDL 3.2 11/20/2023 0901   CHOLHDL 3.3 12/15/2019 1048   VLDL 21 03/22/2017 0803   LDLCALC 65 11/20/2023 0901   LDLCALC 80 12/15/2019 1048   LABVLDL 17 11/20/2023 0901      Lab Results  Component Value Date   TSH 3.070 12/26/2023   TSH 2.530 11/20/2023   TSH 3.650 05/16/2023   TSH 0.849 09/12/2022   TSH 4.710 (H) 05/14/2022   TSH 4.290 12/25/2021   TSH 8.400 (H) 08/29/2021   TSH 7.640 (H) 04/28/2021   TSH 4.360 05/23/2020   TSH 6.85 (H) 07/24/2019   FREET4 1.03 12/26/2023   FREET4 1.01 05/16/2023   FREET4 1.05 09/12/2022   FREET4 0.79 (L) 05/14/2022   FREET4 0.83 12/25/2021   FREET4 0.75 (L) 08/29/2021   FREET4 0.75 (L) 04/28/2021   FREET4 1.0 12/15/2019   FREET4 0.8 07/24/2019   FREET4 0.9 08/04/2018      Latest Reference Range & Units 12/25/21 08:45 05/14/22 09:11 09/12/22 09:12 05/16/23 07:59 11/20/23 09:01 12/26/23 08:54  TSH 0.450 - 4.500 uIU/mL 4.290 4.710 (H) 0.849 3.650 2.530 3.070  T4,Free(Direct) 0.82 - 1.77 ng/dL 2.13 0.86 (L) 5.78 4.69  1.03  (H): Data is abnormally high (L): Data is abnormally low  Assessment & Plan:   1) Type 2 diabetes mellitus with stage 2 chronic kidney disease, with long-term current use  of insulin (HCC)  - RHEANNE CORTOPASSI has currently uncontrolled symptomatic type 2 DM since  83 years of age.  Recent labs reviewed.  She presents today with her meter and logs showing at goal glycemic profile overall.  Her POCT A1c today is 7.6%, improving from last visit of 7.8%.  Analysis of her meter shows 7-day average of 106, 14-day average of 109, 30-day average of 117.  She denies any hypoglycemia.   - I had a long discussion with her about the progressive nature of diabetes and the pathology behind its complications. -her diabetes is complicated by stage 2 CKD, obesity /sedentary life, and she remains at a high risk for more acute and chronic complications which include CAD, CVA, CKD, retinopathy, and neuropathy. These are all discussed in detail with her.  - Nutritional counseling repeated at each appointment due to patients tendency to fall back in to old habits.  - The patient admits there is a room for improvement in their diet and drink choices. -  Suggestion is made for the patient to avoid simple carbohydrates from their diet including Cakes, Sweet Desserts / Pastries, Ice Cream, Soda (diet and regular), Sweet Tea, Candies, Chips, Cookies, Sweet Pastries, Store Bought Juices, Alcohol in Excess of 1-2 drinks a day, Artificial Sweeteners, Coffee Creamer, and "Sugar-free" Products. This will help patient to have stable blood glucose profile and potentially avoid unintended weight gain.   - I encouraged the patient to switch to unprocessed or minimally processed complex starch and increased protein intake (animal or plant source), fruits, and vegetables.   - Patient is advised to stick to a routine mealtimes to eat 3 meals a day and avoid unnecessary snacks (to snack only to correct hypoglycemia).  - I have approached her with the following individualized plan to manage  her diabetes and patient agrees:   - she will continue to need at least basal insulin in order for her to achieve control of diabetes to target.    -Given her stable glycemic profile and lack of hypoglycemia, no changes will  be made to her medications today.  She is advised to continue Tresiba 28 units SQ nightly and Glipizide 2.5 mg XL daily with breakfast.    -She is encouraged to continue monitoring glucose twice daily (consistently), before breakfast and before bed, and to call the clinic if she has readings less than 70 or greater than 300 for 3 tests in a row.  -She will be considered for incretin therapy during her next visit.  - Specific targets for  A1c;  LDL, HDL,  and Triglycerides were discussed with the patient.  2) Blood Pressure /Hypertension:  Her blood pressure is controlled to target for her age (she also notes she just took her BP meds prior to the visit today).  She is advised to continue Norvasc 10 mg po daily, Irbesartan 300 mg po daily, Metoprolol 100 mg po twice daily, and Aldactone 50 mg po daily.  3) Lipids/Hyperlipidemia:    Her most recent lipid panel from 11/20/23 shows controlled LDL at 65.  She is advised to continue Atorvastatin 80 mg po daily at bedtime.  Side effects and precautions discussed with her.  4)  Weight/Diet:  Her Body mass index is 33.24 kg/m.  -   clearly complicating her diabetes care.   she is  a candidate for weight loss. I discussed with her the fact that loss of 5 - 10% of her  current body weight will have the most  impact on her diabetes management.  Exercise, and detailed carbohydrates information provided  -  detailed on discharge instructions.  5) Hypothyroidism- unspecified Her previsit TFTs are consistent with appropriate hormone replacement.  She is advised to continue Levothyroxine 50 mcg po daily before breakfast.    - The correct intake of thyroid hormone (Levothyroxine, Synthroid), is on empty stomach first thing in the morning, with water, separated by at least 30 minutes from breakfast and other medications,  and separated by more than 4 hours from calcium, iron, multivitamins, acid reflux medications (PPIs).  - This medication is a life-long  medication and will be needed to correct thyroid hormone imbalances for the rest of your life.  The dose may change from time to time, based on thyroid blood work.  - It is extremely important to be consistent taking this medication, near the same time each morning.  -AVOID TAKING PRODUCTS CONTAINING BIOTIN (commonly found in Hair, Skin, Nails vitamins) AS IT INTERFERES WITH THE VALIDITY OF THYROID FUNCTION BLOOD TESTS.  6) Chronic Care/Health Maintenance: -she is on Statin medications and  is encouraged to initiate and continue to follow up with Ophthalmology, Dentist,  Podiatrist at least yearly or according to recommendations, and advised to stay away from smoking. I have recommended yearly flu vaccine and pneumonia vaccine at least every 5 years; moderate intensity exercise for up to 150 minutes weekly; and  sleep for at least 7 hours a day.  - she is advised to maintain close follow up with [name], MD for primary care needs, as well as her other providers for optimal and coordinated care.      I spent  18  minutes in the care of the patient today including review of labs from CMP, Lipids, Thyroid Function, Hematology (current and previous including abstractions from other facilities); face-to-face time discussing  her blood glucose readings/logs, discussing hypoglycemia and hyperglycemia episodes and symptoms, medications doses, her options of short and long term treatment based on the latest standards of care / guidelines;  discussion about incorporating lifestyle medicine;  and documenting the encounter. Risk reduction counseling performed per USPSTF guidelines to reduce obesity and cardiovascular risk factors.     Please refer to Patient Instructions for Blood Glucose Monitoring and Insulin/Medications Dosing Guide"  in media tab for additional information. Please  also refer to " Patient Self Inventory" in the Media  tab for reviewed elements of pertinent patient  history.  Mauro Kaufmann participated in the discussions, expressed understanding, and voiced agreement with the above plans.  All questions were answered to her satisfaction. she is encouraged to contact clinic should she have any questions or concerns prior to her return visit.   Follow up plan: - Return in about 4 months (around 05/03/2024) for Diabetes F/U with A1c in office, No previsit labs, Bring meter and logs.  Ronny Bacon, [facility] [address] [place], [place] [address] Phone: [phone] Fax: [phone]  01/02/2024, 8:40 AM

## 2024-01-21 DIAGNOSIS — E1122 Type 2 diabetes mellitus with diabetic chronic kidney disease: Secondary | ICD-10-CM | POA: Diagnosis not present

## 2024-01-21 DIAGNOSIS — N182 Chronic kidney disease, stage 2 (mild): Secondary | ICD-10-CM | POA: Diagnosis not present

## 2024-01-21 DIAGNOSIS — Z794 Long term (current) use of insulin: Secondary | ICD-10-CM | POA: Diagnosis not present

## 2024-01-21 DIAGNOSIS — D649 Anemia, unspecified: Secondary | ICD-10-CM | POA: Diagnosis not present

## 2024-01-23 ENCOUNTER — Ambulatory Visit (INDEPENDENT_AMBULATORY_CARE_PROVIDER_SITE_OTHER): Payer: Medicare Other | Admitting: Family Medicine

## 2024-01-23 VITALS — BP 140/80 | HR 64 | Resp 18 | Ht 67.0 in | Wt 211.1 lb

## 2024-01-23 DIAGNOSIS — F439 Reaction to severe stress, unspecified: Secondary | ICD-10-CM | POA: Diagnosis not present

## 2024-01-23 DIAGNOSIS — I10 Essential (primary) hypertension: Secondary | ICD-10-CM | POA: Diagnosis not present

## 2024-01-23 DIAGNOSIS — Z794 Long term (current) use of insulin: Secondary | ICD-10-CM

## 2024-01-23 DIAGNOSIS — E039 Hypothyroidism, unspecified: Secondary | ICD-10-CM | POA: Diagnosis not present

## 2024-01-23 DIAGNOSIS — E1165 Type 2 diabetes mellitus with hyperglycemia: Secondary | ICD-10-CM

## 2024-01-23 DIAGNOSIS — E669 Obesity, unspecified: Secondary | ICD-10-CM

## 2024-01-23 DIAGNOSIS — E559 Vitamin D deficiency, unspecified: Secondary | ICD-10-CM

## 2024-01-23 DIAGNOSIS — E785 Hyperlipidemia, unspecified: Secondary | ICD-10-CM

## 2024-01-23 DIAGNOSIS — I5032 Chronic diastolic (congestive) heart failure: Secondary | ICD-10-CM | POA: Diagnosis not present

## 2024-01-23 DIAGNOSIS — N1831 Chronic kidney disease, stage 3a: Secondary | ICD-10-CM

## 2024-01-23 DIAGNOSIS — I130 Hypertensive heart and chronic kidney disease with heart failure and stage 1 through stage 4 chronic kidney disease, or unspecified chronic kidney disease: Secondary | ICD-10-CM

## 2024-01-23 DIAGNOSIS — D649 Anemia, unspecified: Secondary | ICD-10-CM

## 2024-01-23 NOTE — Patient Instructions (Signed)
 F/u in 5 months, call if you need me sooner  Start carvedilol 3.125 mg one tablet  at bedtime, if able to tolerate then that is good,  if not stop  Nurse BP check in 6 weeks    I will forward labs with message to Hematology  Thanks for choosing [facility], we consider it a privelige to serve you.

## 2024-01-26 ENCOUNTER — Telehealth: Payer: Self-pay | Admitting: Family Medicine

## 2024-01-26 ENCOUNTER — Encounter: Payer: Self-pay | Admitting: Family Medicine

## 2024-01-26 LAB — BMP8+EGFR
BUN/Creatinine Ratio: 22 (ref 12–28)
BUN: 29 mg/dL — ABNORMAL HIGH (ref 8–27)
CO2: 22 mmol/L (ref 20–29)
Calcium: 9.8 mg/dL (ref 8.7–10.3)
Chloride: 103 mmol/L (ref 96–106)
Creatinine, Ser: 1.3 mg/dL — ABNORMAL HIGH (ref 0.57–1.00)
Glucose: 367 mg/dL — ABNORMAL HIGH (ref 70–99)
Potassium: 4.5 mmol/L (ref 3.5–5.2)
Sodium: 141 mmol/L (ref 134–144)
eGFR: 41 mL/min/{1.73_m2} — ABNORMAL LOW (ref >59)

## 2024-01-26 LAB — IRON,TIBC AND FERRITIN PANEL
Ferritin: 410 ng/mL — ABNORMAL HIGH (ref 15–150)
Iron Saturation: 33 % (ref 15–55)
Iron: 95 ug/dL (ref 27–139)
Total Iron Binding Capacity: 287 ug/dL (ref 250–450)
UIBC: 192 ug/dL (ref 118–369)

## 2024-01-26 LAB — METHYLMALONIC ACID, SERUM: Methylmalonic Acid: 201 nmol/L (ref 0–378)

## 2024-01-26 LAB — VITAMIN B12: Vitamin B-12: 1245 pg/mL (ref 232–1245)

## 2024-01-26 LAB — FOLATE: Folate: >20 ng/mL (ref >3.0)

## 2024-01-26 NOTE — Progress Notes (Signed)
 Kimberly Freeman     MRN: [id number]      DOB: 11/07/1940  Chief Complaint  Patient presents with   Hypertension    Follow up on blood pressure. States she stopped taking the carvedilol due to it making her sleepy.     HPI [name] is here for follow up and re-evaluation of chronic medical conditions, medication management and review of any available recent lab and radiology data.  Preventive health is updated, specifically  Cancer screening and Immunization.   Questions or concerns regarding consultations or procedures which the PT has had in the interim are  addressed. The PT was unable to tolerate coreg as prescribed , felt sleepy, will try lower dose of one daily or half twice daily if scored Denies polyuria, polydipsia, blurred vision , or hypoglycemic episodes. Blood sugar is stable and within range, upper end ROS Denies recent fever or chills. Denies sinus pressure, nasal congestion, ear pain or sore throat. Denies chest congestion, productive cough or wheezing. Denies chest pains, palpitations and leg swelling Denies abdominal pain, nausea, vomiting,diarrhea or constipation.   Denies dysuria, frequency, hesitancy or incontinence. Denies uncontrolled joint pain, swelling and limitation in mobility. Denies headaches, seizures, numbness, or tingling. Denies depression, anxiety or insomnia. Denies skin break down or rash.   PE  BP (!) 140/80   Pulse 64   Resp 18   Ht 5\' 7"  (1.702 m)   Wt 211 lb 1.3 oz (95.7 kg)   SpO2 98%   BMI 33.06 kg/m   Patient alert and oriented and in no cardiopulmonary distress.  HEENT: No facial asymmetry, EOMI,     Neck supple .  Chest: Clear to auscultation bilaterally.  CVS: S1, S2 no murmurs, no S3.Regular rate.  ABD: Soft non tender.   Ext: No edema  MS: Adequate  though reduced ROM spine, shoulders, hips and knees.  Skin: Intact, no ulcerations or rash noted.  Psych: Good eye contact, normal affect. Memory intact not anxious  or depressed appearing.  CNS: CN 2-12 intact, power,  normal throughout.no focal deficits noted.   Assessment & Plan  Malignant hypertension Nearly at goal, trial of once daily coreg, if tolerates, nurse BP check in 6 weeka DASH diet and commitment to daily physical activity for a minimum of 30 minutes discussed and encouraged, as a part of hypertension management. The importance of attaining a healthy weight is also discussed.     01/23/2024   11:02 AM 01/23/2024   10:31 AM 01/02/2024    8:26 AM 11/21/2023   10:27 AM 11/21/2023   10:26 AM 11/06/2023    9:14 AM 09/24/2023    9:23 AM  BP/Weight  Systolic BP 140 146 140 146 146 165 138  Diastolic BP 80 75 70 75 75 70 68  Wt. (Lbs)  211.08 212.2  209.12  212.4  BMI  33.06 kg/m2 33.24 kg/m2  32.75 kg/m2  33.27 kg/m2       Uncontrolled type 2 diabetes mellitus with hyperglycemia, with long-term current use of insulin (HCC) Diabetes associated with hypertension, hyperlipidemia, and arthritis  [name] is reminded of the importance of commitment to daily physical activity for 30 minutes or more, as able and the need to limit carbohydrate intake to 30 to 60 grams per meal to help with blood sugar control.   The need to take medication as prescribed, test blood sugar as directed, and to call between visits if there is a concern that blood sugar is uncontrolled is  also discussed.   [name] is reminded of the importance of daily foot exam, annual eye examination, and good blood sugar, blood pressure and cholesterol control.     Latest Ref Rng & Units 01/21/2024   11:47 AM 01/02/2024    8:38 AM 12/26/2023    8:54 AM 11/20/2023    9:01 AM 09/24/2023    9:40 AM  Diabetic Labs  HbA1c 4.0 - 5.6 %  7.6    7.8   Micro/Creat Ratio 0 - 29 mg/g creat    108    Chol 100 - 199 mg/dL    244    HDL >01 mg/dL    38    Calc LDL 0 - 99 mg/dL    65    Triglycerides 0 - 149 mg/dL    87    Creatinine 0.27 - 1.00 mg/dL 2.53   6.64  4.03         01/23/2024   11:02 AM 01/23/2024   10:31 AM 01/02/2024    8:26 AM 11/21/2023   10:27 AM 11/21/2023   10:26 AM 11/06/2023    9:14 AM 09/24/2023    9:23 AM  BP/Weight  Systolic BP 140 146 140 146 146 165 138  Diastolic BP 80 75 70 75 75 70 68  Wt. (Lbs)  211.08 212.2  209.12  212.4  BMI  33.06 kg/m2 33.24 kg/m2  32.75 kg/m2  33.27 kg/m2      Latest Ref Rng & Units 10/01/2023   12:00 AM 09/27/2022   12:00 AM  Foot/eye exam completion dates  Eye Exam No Retinopathy No Retinopathy     No Retinopathy         This result is from an external source.        Stage 3a chronic kidney disease (CKD) (HCC) Stable , tight control of diabetes and hypertension as able  Hypothyroid Managed by endo and controlled on current med  Hypertensive heart and kidney disease with chronic diastolic congestive heart failure and stage 3a chronic kidney disease (HCC) No s/s of decompensation at visit  Stress at home Unchanged severe dementia of spouse  Anemia Labs recommended by heme/ onc will be forwarded , they are stable, pt does not wish to f/u hematology unless ABSOLUTELY necessary  Obesity (BMI 30-39.9)  Patient re-educated about  the importance of commitment to a  minimum of 150 minutes of exercise per week as able.  The importance of healthy food choices with portion control discussed, as well as eating regularly and within a 12 hour window most days. The need to choose "clean , green" food 50 to 75% of the time is discussed, as well as to make water the primary drink and set a goal of 64 ounces water daily.       01/23/2024   10:31 AM 01/02/2024    8:26 AM 11/21/2023   10:26 AM  Weight /BMI  Weight 211 lb 1.3 oz 212 lb 3.2 oz 209 lb 1.9 oz  Height 5\' 7"  (1.702 m) 5\' 7"  (1.702 m) 5\' 7"  (1.702 m)  BMI 33.06 kg/m2 33.24 kg/m2 32.75 kg/m2      Vitamin D deficiency Controlled, no change in medication   Dyslipidemia, goal LDL below 70 Hyperlipidemia:Low fat diet discussed and  encouraged.   Lipid Panel  Lab Results  Component Value Date   CHOL 120 11/20/2023   HDL 38 (L) 11/20/2023   LDLCALC 65 11/20/2023   TRIG 87 11/20/2023   CHOLHDL 3.2 11/20/2023  Controlled, no change in medication

## 2024-01-26 NOTE — Assessment & Plan Note (Signed)
 Hyperlipidemia:Low fat diet discussed and encouraged.   Lipid Panel  Lab Results  Component Value Date   CHOL 120 11/20/2023   HDL 38 (L) 11/20/2023   LDLCALC 65 11/20/2023   TRIG 87 11/20/2023   CHOLHDL 3.2 11/20/2023     Controlled, no change in medication

## 2024-01-26 NOTE — Telephone Encounter (Signed)
 Pt is requesting that office visits for follow up with hematology be discontinued unless ABSOLUTELY medically necessary I have sent current labs for your review and labs are stable with no red flags that I see  Please let me know if she needs to continue to follow with you at this time , I will relay message to pt if she needs to  see you Thanks

## 2024-01-26 NOTE — Assessment & Plan Note (Signed)
  Patient re-educated about  the importance of commitment to a  minimum of 150 minutes of exercise per week as able.  The importance of healthy food choices with portion control discussed, as well as eating regularly and within a 12 hour window most days. The need to choose "clean , green" food 50 to 75% of the time is discussed, as well as to make water the primary drink and set a goal of 64 ounces water daily.       01/23/2024   10:31 AM 01/02/2024    8:26 AM 11/21/2023   10:26 AM  Weight /BMI  Weight 211 lb 1.3 oz 212 lb 3.2 oz 209 lb 1.9 oz  Height 5\' 7"  (1.702 m) 5\' 7"  (1.702 m) 5\' 7"  (1.702 m)  BMI 33.06 kg/m2 33.24 kg/m2 32.75 kg/m2

## 2024-01-26 NOTE — Assessment & Plan Note (Signed)
 Unchanged severe dementia of spouse

## 2024-01-26 NOTE — Assessment & Plan Note (Addendum)
 Managed by endo and controlled on current med

## 2024-01-26 NOTE — Assessment & Plan Note (Signed)
 Controlled, no change in medication

## 2024-01-26 NOTE — Assessment & Plan Note (Signed)
 No s/s of decompensation  at visit

## 2024-01-26 NOTE — Assessment & Plan Note (Signed)
 Stable , tight control of diabetes and hypertension as able

## 2024-01-26 NOTE — Assessment & Plan Note (Signed)
 Diabetes associated with hypertension, hyperlipidemia, and arthritis  Kimberly Freeman is reminded of the importance of commitment to daily physical activity for 30 minutes or more, as able and the need to limit carbohydrate intake to 30 to 60 grams per meal to help with blood sugar control.   The need to take medication as prescribed, test blood sugar as directed, and to call between visits if there is a concern that blood sugar is uncontrolled is also discussed.   Kimberly Freeman is reminded of the importance of daily foot exam, annual eye examination, and good blood sugar, blood pressure and cholesterol control.     Latest Ref Rng & Units 01/21/2024   11:47 AM 01/02/2024    8:38 AM 12/26/2023    8:54 AM 11/20/2023    9:01 AM 09/24/2023    9:40 AM  Diabetic Labs  HbA1c 4.0 - 5.6 %  7.6    7.8   Micro/Creat Ratio 0 - 29 mg/g creat    108    Chol 100 - 199 mg/dL    601    HDL >09 mg/dL    38    Calc LDL 0 - 99 mg/dL    65    Triglycerides 0 - 149 mg/dL    87    Creatinine 3.23 - 1.00 mg/dL 5.57   3.22  0.25        01/23/2024   11:02 AM 01/23/2024   10:31 AM 01/02/2024    8:26 AM 11/21/2023   10:27 AM 11/21/2023   10:26 AM 11/06/2023    9:14 AM 09/24/2023    9:23 AM  BP/Weight  Systolic BP 140 146 140 146 146 165 138  Diastolic BP 80 75 70 75 75 70 68  Wt. (Lbs)  211.08 212.2  209.12  212.4  BMI  33.06 kg/m2 33.24 kg/m2  32.75 kg/m2  33.27 kg/m2      Latest Ref Rng & Units 10/01/2023   12:00 AM 09/27/2022   12:00 AM  Foot/eye exam completion dates  Eye Exam No Retinopathy No Retinopathy     No Retinopathy         This result is from an external source.

## 2024-01-26 NOTE — Assessment & Plan Note (Signed)
 Nearly at goal, trial of once daily coreg, if tolerates, nurse BP check in 6 weeka DASH diet and commitment to daily physical activity for a minimum of 30 minutes discussed and encouraged, as a part of hypertension management. The importance of attaining a healthy weight is also discussed.     01/23/2024   11:02 AM 01/23/2024   10:31 AM 01/02/2024    8:26 AM 11/21/2023   10:27 AM 11/21/2023   10:26 AM 11/06/2023    9:14 AM 09/24/2023    9:23 AM  BP/Weight  Systolic BP 140 146 140 146 146 165 138  Diastolic BP 80 75 70 75 75 70 68  Wt. (Lbs)  211.08 212.2  209.12  212.4  BMI  33.06 kg/m2 33.24 kg/m2  32.75 kg/m2  33.27 kg/m2

## 2024-01-26 NOTE — Assessment & Plan Note (Signed)
 Labs recommended by heme/ onc will be forwarded , they are stable, pt does not wish to f/u hematology unless ABSOLUTELY necessary

## 2024-01-27 NOTE — Telephone Encounter (Signed)
 FYI

## 2024-01-27 NOTE — Telephone Encounter (Signed)
 Thank you for letting me know, Dr. Lodema Hong.  I think that regular PCP follow-up is fine for the time being.  If she develops any worsening anemia with Hgb consistently <10.0, please have her come back to [place] so that we can start her on Retacrit or similar treatment.  Additionally, we would want to see her if she had any significant iron deficiency but could require IV iron infusion.  Thank you for letting us know!  And of course, please reach out in the future if there is anything we can do to help.

## 2024-01-28 ENCOUNTER — Telehealth: Payer: Self-pay | Admitting: Family Medicine

## 2024-01-28 NOTE — Telephone Encounter (Signed)
 Pls let her know I got a response from Hematology with guidelines to follow re ongoing f/u is  concerned. She will be referred back if new problems develop

## 2024-01-28 NOTE — Telephone Encounter (Signed)
 Done

## 2024-01-28 NOTE — Telephone Encounter (Signed)
Pt informed

## 2024-02-04 ENCOUNTER — Telehealth: Payer: Self-pay | Admitting: Cardiology

## 2024-02-04 ENCOUNTER — Ambulatory Visit (INDEPENDENT_AMBULATORY_CARE_PROVIDER_SITE_OTHER): Payer: Self-pay

## 2024-02-04 VITALS — Ht 67.0 in | Wt 201.0 lb

## 2024-02-04 DIAGNOSIS — Z0000 Encounter for general adult medical examination without abnormal findings: Secondary | ICD-10-CM

## 2024-02-04 MED ORDER — METOPROLOL TARTRATE 100 MG PO TABS: 100.0000 mg | ORAL_TABLET | Freq: Two times a day (BID) | ORAL | 3 refills | Status: AC

## 2024-02-04 NOTE — Progress Notes (Signed)
 Please attest and cosign this visit due to patients primary care provider not being in the office at the time the visit was completed.  Because this visit was a virtual/telehealth visit,  certain criteria was not obtained, such a blood pressure, CBG if applicable, and timed get up and go. Any medications not marked as "taking" were not mentioned during the medication reconciliation part of the visit. Any vitals not documented were not able to be obtained due to this being a telehealth visit or patient was unable to self-report a recent blood pressure reading due to a lack of equipment at home via telehealth. Vitals that have been documented are verbally provided by the patient.   Subjective:   Kimberly Freeman is a 83 y.o. who presents for a Medicare Wellness preventive visit.  Visit Complete: Virtual I connected with  Kimberly Freeman on 02/04/24 by a audio enabled telemedicine application and verified that I am speaking with the correct person using two identifiers.  Patient Location: Home  Provider Location: Home Office  I discussed the limitations of evaluation and management by telemedicine. The patient expressed understanding and agreed to proceed.  Vital Signs: Because this visit was a virtual/telehealth visit, some criteria may be missing or patient reported. Any vitals not documented were not able to be obtained and vitals that have been documented are patient reported.  VideoDeclined- This patient declined Librarian, academic. Therefore the visit was completed with audio only.  Persons Participating in Visit: Patient.  AWV Questionnaire: No: Patient Medicare AWV questionnaire was not completed prior to this visit.  Cardiac Risk Factors include: advanced age (>54men, >86 women);diabetes mellitus;dyslipidemia;obesity (BMI >30kg/m2);sedentary lifestyle     Objective:    Today's Vitals   02/04/24 1101  Weight: 201 lb (91.2 kg)  Height: 5\' 7"  (1.702 m)    Body mass index is 31.48 kg/m.     02/04/2024   11:12 AM 02/13/2023    8:30 AM 02/13/2022    1:09 PM 09/30/2021    9:07 AM 08/09/2021   11:48 AM 02/01/2021    3:13 PM 09/27/2020    9:37 AM  Advanced Directives  Does Patient Have a Medical Advance Directive? No No No No No No No  Would patient like information on creating a medical advance directive? No - Patient declined No - Patient declined No - Patient declined Yes (MAU/Ambulatory/Procedural Areas - Information given) No - Patient declined No - Patient declined No - Patient declined    Current Medications (verified) Outpatient Encounter Medications as of 02/04/2024  Medication Sig   acetaminophen (TYLENOL) 500 MG tablet Take 500 mg by mouth daily as needed for mild pain or moderate pain. For pain   amLODipine (NORVASC) 10 MG tablet TAKE 1 TABLET DAILY (DOSE INCREASE)   aspirin 81 MG chewable tablet Chew 1 tablet (81 mg total) by mouth daily.   atorvastatin (LIPITOR) 80 MG tablet TAKE 1 TABLET DAILY AT 6 P.M.   BD PEN NEEDLE MICRO U/F 32G X 6 MM MISC USE TO INJECT INSULIN DAILY   betamethasone dipropionate 0.05 % cream Apply topically 2 (two) times daily.   calcium-vitamin D (OSCAL WITH D) 500-200 MG-UNIT tablet Take 1 tablet by mouth.   carvedilol (COREG) 3.125 MG tablet Take 1 tablet (3.125 mg total) by mouth 2 (two) times daily with a meal.   Easy Touch Lancets 28G MISC USE TO TEST TWICE A DAY   glipiZIDE (GLUCOTROL XL) 2.5 MG 24 hr tablet Take 1 tablet (2.5  mg total) by mouth daily with breakfast.   glucose blood (ONETOUCH ULTRA) test strip Use as instructed   insulin degludec (TRESIBA FLEXTOUCH) 100 UNIT/ML FlexTouch Pen Inject 28 Units into the skin at bedtime.   irbesartan (AVAPRO) 300 MG tablet TAKE 1 TABLET DAILY   Lancets (ONETOUCH ULTRASOFT) lancets USE AS DIRECTED TWICE DAILY   levothyroxine (SYNTHROID) 50 MCG tablet Take 1 tablet (50 mcg total) by mouth daily before breakfast.   metoprolol tartrate (LOPRESSOR) 100 MG  tablet TAKE 1 TABLET TWICE A DAY   Multiple Vitamin (MULTIVITAMIN WITH MINERALS) TABS tablet Take 1 tablet by mouth daily.   pantoprazole (PROTONIX) 40 MG tablet Take 40 mg by mouth daily.   polyethylene glycol (MIRALAX / GLYCOLAX) 17 g packet Take 17 g by mouth daily as needed.   spironolactone (ALDACTONE) 50 MG tablet Take 1 tablet (50 mg total) by mouth 2 (two) times daily.   spironolactone (ALDACTONE) 50 MG tablet Take 1 tablet (50 mg total) by mouth 2 (two) times daily.   No facility-administered encounter medications on file as of 02/04/2024.    Allergies (verified) Poison oak extract [poison oak extract], Sulfonamide derivatives, and Levemir [insulin detemir]   History: Past Medical History:  Diagnosis Date   Arthritis    CAD (coronary artery disease) 08/2019   60% RI with other 20-30% lesions at cath   Diabetes mellitus, type 2 (HCC)    Essential hypertension    GERD (gastroesophageal reflux disease)    Hyperlipidemia    LVH (left ventricular hypertrophy)    Past Surgical History:  Procedure Laterality Date   ABDOMINAL HYSTERECTOMY     BIOPSY  07/18/2017   Procedure: BIOPSY;  Surgeon: [name], MD;  Location: [facility];  Service: Endoscopy;;  gastric   CHOLECYSTECTOMY  2004   COLONOSCOPY N/A 07/18/2017   Dr. [name]: Internal and external hemorrhoids.  No future screening/surveillance colonoscopies due to age.   ESOPHAGOGASTRODUODENOSCOPY N/A 07/18/2017   Dr. [name]: Small hiatal hernia, patchy mild inflammation characterized by congestion, erosions, erythema in the cardia, gastric body and antrum.  Diffuse moderate inflammation in the duodenal bulb and second portion duodenum.  Biopsies benign.  No H. pylori.   LEFT HEART CATH AND CORONARY ANGIOGRAPHY N/A 09/21/2019   Procedure: LEFT HEART CATH AND CORONARY ANGIOGRAPHY;  Surgeon: [name], MD;  Location: [facility];  Service: Cardiovascular;  Laterality: N/A;   Family History  Problem Relation Age  of Onset   Leukemia Mother    Cancer Mother    Diabetes Mother    Prostate cancer Father    Lung cancer Sister 51   Diabetes Sister    Colon cancer Neg Hx    Social History   Socioeconomic History   Marital status: Married    Spouse name: [name]    Number of children: 0   Years of education: 12+   Highest education level: 12th grade  Occupational History   Occupation: retired   Tobacco Use   Smoking status: Never   Smokeless tobacco: Never  Vaping Use   Vaping status: Never Used  Substance and Sexual Activity   Alcohol use: No    Alcohol/week: 0.0 standard drinks of alcohol   Drug use: No   Sexual activity: Not on file  Other Topics Concern   Not on file  Social History Narrative   Lives alone with husband    Social Drivers of Health   Financial Resource Strain: Low Risk  (02/04/2024)  Overall Financial Resource Strain (CARDIA)    Difficulty of Paying Living Expenses: Not hard at all  Food Insecurity: No Food Insecurity (02/04/2024)   Hunger Vital Sign    Worried About Running Out of Food in the Last Year: Never true    Ran Out of Food in the Last Year: Never true  Transportation Needs: No Transportation Needs (02/04/2024)   PRAPARE - Administrator, Civil Service (Medical): No    Lack of Transportation (Non-Medical): No  Physical Activity: Insufficiently Active (02/04/2024)   Exercise Vital Sign    Days of Exercise per Week: 7 days    Minutes of Exercise per Session: 20 min  Stress: No Stress Concern Present (02/04/2024)   [employer] of Occupational Health - Occupational Stress Questionnaire    Feeling of Stress : Not at all  Social Connections: Socially Integrated (02/04/2024)   Social Connection and Isolation Panel [NHANES]    Frequency of Communication with Friends and Family: More than three times a week    Frequency of Social Gatherings with Friends and Family: More than three times a week    Attends Religious Services: More than 4 times  per year    Active Member of [employer] or Organizations: Yes    Attends Engineer, structural: More than 4 times per year    Marital Status: Married    Tobacco Counseling Counseling given: Yes    Clinical Intake:  Pre-visit preparation completed: Yes  Pain : No/denies pain     BMI - recorded: 31.48 Nutritional Risks: None Diabetes: No  Lab Results  Component Value Date   HGBA1C 7.6 (A) 01/02/2024   HGBA1C 7.8 (A) 09/24/2023   HGBA1C 7.5 (A) 05/21/2023     How often do you need to have someone help you when you read instructions, pamphlets, or other written materials from your doctor or pharmacy?: 1 - Never  Interpreter Needed?: No  Information entered by :: Abby W   Activities of Daily Living     02/04/2024   11:12 AM  In your present state of health, do you have any difficulty performing the following activities:  Hearing? 0  Vision? 0  Difficulty concentrating or making decisions? 0  Walking or climbing stairs? 0  Dressing or bathing? 0  Doing errands, shopping? 0  Preparing Food and eating ? N  Using the Toilet? N  In the past six months, have you accidently leaked urine? N  Do you have problems with loss of bowel control? N  Managing your Medications? N  Managing your Finances? N  Housekeeping or managing your Housekeeping? N    Patient Care Team: Towanda Fret, MD as PCP - General Londa Rival Davida Espy, MD as PCP - Cardiology (Cardiology) Iverson Market, DPM as Consulting Physician (Podiatry)  Indicate any recent Medical Services you may have received from other than [facility] providers in the past year (date may be approximate).     Assessment:   This is a routine wellness examination for Leonetta.  Hearing/Vision screen Hearing Screening - Comments:: Patient denies any hearing difficulties.   Vision Screening - Comments:: Wears rx glasses - up to date with routine eye exams  Patient sees Dr. Lucendia Rusk w/ [facility] [employer]  office.     Goals Addressed             This Visit's Progress    DIET - REDUCE SUGAR INTAKE   On track    Exercise 5x per week (60  min per time)   On track    Starting 10/04/2016 patient would like to start back on her exercise bicycle 5 times a week for 60 minutes at a time.     Patient Stated       Waynetta Hair planning for this extravagant party that will be a birthday/anniversary party        Depression Screen     02/04/2024   11:13 AM 01/23/2024   10:33 AM 11/21/2023   10:27 AM 04/16/2023    9:43 AM 02/01/2023   10:18 AM 08/01/2022   10:01 AM 02/22/2022   10:26 AM  PHQ 2/9 Scores  PHQ - 2 Score 0 0 0 0 0 0 0  PHQ- 9 Score 0          Fall Risk     02/04/2024   11:12 AM 01/23/2024   10:33 AM 11/21/2023   10:27 AM 04/16/2023    9:43 AM 02/01/2023   10:18 AM  Fall Risk   Falls in the past year? 0 0 0 0 0  Number falls in past yr: 0 0 0 0 0  Injury with Fall? 0 0 0 0 0  Risk for fall due to : No Fall Risks  No Fall Risks No Fall Risks   Follow up Falls prevention discussed;Falls evaluation completed;Education provided  Falls evaluation completed Falls evaluation completed     MEDICARE RISK AT HOME:  Medicare Risk at Home Any stairs in or around the home?: Yes If so, are there any without handrails?: No Home free of loose throw rugs in walkways, pet beds, electrical cords, etc?: Yes Adequate lighting in your home to reduce risk of falls?: Yes Life alert?: No Use of a cane, walker or w/c?: No Grab bars in the bathroom?: Yes Shower chair or bench in shower?: No Elevated toilet seat or a handicapped toilet?: Yes  TIMED UP AND GO:  Was the test performed?  No  Cognitive Function: 6CIT completed    09/30/2021    9:08 AM  MMSE - Mini Mental State Exam  Not completed: Unable to complete        02/04/2024   11:11 AM 09/30/2021    9:08 AM 09/27/2020    9:39 AM 09/24/2019    9:01 AM 09/22/2018    8:13 AM  6CIT Screen  What Year? 0 points 0 points 0 points 0 points 0  points  What month? 0 points 0 points 0 points 0 points 0 points  What time? 0 points 0 points 0 points 0 points 0 points  Count back from 20 0 points 0 points 0 points 0 points 0 points  Months in reverse 0 points 0 points 0 points 0 points 0 points  Repeat phrase 0 points 0 points 2 points 0 points 2 points  Total Score 0 points 0 points 2 points 0 points 2 points    Immunizations Immunization History  Administered Date(s) Administered   Fluad Quad(high Dose 65+) 07/27/2019, 06/23/2020, 06/29/2021, 08/01/2022   Fluad Trivalent(High Dose 65+) 07/16/2023   Influenza Split 08/01/2011, 07/31/2012   Influenza Whole 07/29/2007, 07/20/2008, 07/21/2009, 07/19/2010   Influenza, High Dose Seasonal PF 07/23/2018   Influenza,inj,Quad PF,6+ Mos 08/07/2013, 06/15/2014, 07/19/2015, 06/28/2016, 07/25/2017   Moderna SARS-COV2 Booster Vaccination 07/26/2021   Moderna Sars-Covid-2 Vaccination 12/22/2019, 01/19/2020, 09/25/2020, 01/29/2021   Pneumococcal Conjugate-13 10/12/2014   Pneumococcal Polysaccharide-23 03/16/2004, 08/29/2012   Td 03/16/2004   Tdap 03/31/2012   Zoster, Live 01/02/2007    Screening Tests Health  Maintenance  Topic Date Due   Zoster Vaccines- Shingrix (1 of 2) 04/10/1989   DTaP/Tdap/Td (3 - Td or Tdap) 03/31/2022   COVID-19 Vaccine (6 - 2024-25 season) 06/23/2023   FOOT EXAM  02/01/2024   INFLUENZA VACCINE  05/22/2024   HEMOGLOBIN A1C  07/04/2024   MAMMOGRAM  09/03/2024   OPHTHALMOLOGY EXAM  09/30/2024   Diabetic kidney evaluation - Urine ACR  11/19/2024   Diabetic kidney evaluation - eGFR measurement  01/20/2025   Medicare Annual Wellness (AWV)  02/03/2025   Pneumonia Vaccine 45+ Years old  Completed   DEXA SCAN  Completed   HPV VACCINES  Aged Out   Meningococcal B Vaccine  Aged Out    Health Maintenance  Health Maintenance Due  Topic Date Due   Zoster Vaccines- Shingrix (1 of 2) 04/10/1989   DTaP/Tdap/Td (3 - Td or Tdap) 03/31/2022   COVID-19 Vaccine (6 -  2024-25 season) 06/23/2023   FOOT EXAM  02/01/2024   Health Maintenance Items Addressed: Patient advised of recommended vaccines and the diabetic foot exam that is due. Verbalizes understanding.   Additional Screening:  Vision Screening: Recommended annual ophthalmology exams for early detection of glaucoma and other disorders of the eye.  Dental Screening: Recommended annual dental exams for proper oral hygiene  Community Resource Referral / Chronic Care Management: CRR required this visit?  No   CCM required this visit?  No     Plan:     I have personally reviewed and noted the following in the patient's chart:   Medical and social history Use of alcohol, tobacco or illicit drugs  Current medications and supplements including opioid prescriptions. Patient is not currently taking opioid prescriptions. Functional ability and status Nutritional status Physical activity Advanced directives List of other physicians Hospitalizations, surgeries, and ER visits in previous 12 months Vitals Screenings to include cognitive, depression, and falls Referrals and appointments  In addition, I have reviewed and discussed with patient certain preventive protocols, quality metrics, and best practice recommendations. A written personalized care plan for preventive services as well as general preventive health recommendations were provided to patient.     Sallye Crease Teagyn Fishel, CMA   02/04/2024   After Visit Summary: (Mail) Due to this being a telephonic visit, the after visit summary with patients personalized plan was offered to patient via mail   Notes: Nothing significant to report at this time.

## 2024-02-04 NOTE — Telephone Encounter (Signed)
Medication refill request completed.  

## 2024-02-04 NOTE — Patient Instructions (Signed)
 Kimberly Freeman , Thank you for taking time to come for your Medicare Wellness Visit. I appreciate your ongoing commitment to your health goals. Please review the following plan we discussed and let me know if I can assist you in the future.   Referrals/Orders/Follow-Ups/Clinician Recommendations:  Next Medicare AWV: February 08, 2025 at 10:00 am telephone visit.      This is a list of the screening recommended for you and due dates:  Health Maintenance  Topic Date Due   Zoster (Shingles) Vaccine (1 of 2) 04/10/1989   DTaP/Tdap/Td vaccine (3 - Td or Tdap) 03/31/2022   COVID-19 Vaccine (6 - 2024-25 season) 06/23/2023   Complete foot exam   02/01/2024   Flu Shot  05/22/2024   Hemoglobin A1C  07/04/2024   Mammogram  09/03/2024   Eye exam for diabetics  09/30/2024   Yearly kidney health urinalysis for diabetes  11/19/2024   Yearly kidney function blood test for diabetes  01/20/2025   Medicare Annual Wellness Visit  02/03/2025   Pneumonia Vaccine  Completed   DEXA scan (bone density measurement)  Completed   HPV Vaccine  Aged Out   Meningitis B Vaccine  Aged Out    Advanced directives: (Declined) Advance directive discussed with you today. Even though you declined this today, please call our office should you change your mind, and we can give you the proper paperwork for you to fill out. Advance Care Planning is important because it:  [x]  Makes sure you receive the medical care that is consistent with your values, goals, and preferences  [x]  It provides guidance to your family and loved ones and it also reduces their decisional burden about whether or not they are making the right decisions based on what you want done  Follow the link provided in your after visit summary or read over the paperwork we have mailed to you to help you started getting your Advance Directives in place. If you need assistance in completing these, please reach out to [place] so that we can help you!   Next Medicare  Annual Wellness Visit scheduled for next year: yes  Understanding Your Risk for Falls Millions of people have serious injuries from falls each year. It is important to understand your risk of falling. Talk with your health care provider about your risk and what you can do to lower it. If you do have a serious fall, make sure to tell your provider. Falling once raises your risk of falling again. How can falls affect me? Serious injuries from falls are common. These include: Broken bones, such as hip fractures. Head injuries, such as traumatic brain injuries (TBI) or concussions. A fear of falling can cause you to avoid activities and stay at home. This can make your muscles weaker and raise your risk for a fall. What can increase my risk? There are a number of risk factors that increase your risk for falling. The more risk factors you have, the higher your risk of falling. Serious injuries from a fall happen most often to people who are older than 85 years old. Teenagers and young adults ages 27-29 are also at higher risk. Common risk factors include: Weakness in the lower body. Being generally weak or confused due to long-term (chronic) illness. Dizziness or balance problems. Poor vision. Medicines that cause dizziness or drowsiness. These may include: Medicines for your blood pressure, heart, anxiety, insomnia, or swelling (edema). Pain medicines. Muscle relaxants. Other risk factors include: Drinking alcohol. Having had a fall  in the past. Having foot pain or wearing improper footwear. Working at a dangerous job. Having any of the following in your home: Tripping hazards, such as floor clutter or loose rugs. Poor lighting. Pets. Having dementia or memory loss. What actions can I take to lower my risk of falling?     Physical activity Stay physically fit. Do strength and balance exercises. Consider taking a regular class to build strength and balance. Yoga and tai chi are good  options. Vision Have your eyes checked every year and your prescription for glasses or contacts updated as needed. Shoes and walking aids Wear non-skid shoes. Wear shoes that have rubber soles and low heels. Do not wear high heels. Do not walk around the house in socks or slippers. Use a cane or walker as told by your provider. Home safety Attach secure railings on both sides of your stairs. Install grab bars for your bathtub, shower, and toilet. Use a non-skid mat in your bathtub or shower. Attach bath mats securely with double-sided, non-slip rug tape. Use good lighting in all rooms. Keep a flashlight near your bed. Make sure there is a clear path from your bed to the bathroom. Use night-lights. Do not use throw rugs. Make sure all carpeting is taped or tacked down securely. Remove all clutter from walkways and stairways, including extension cords. Repair uneven or broken steps and floors. Avoid walking on icy or slippery surfaces. Walk on the grass instead of on icy or slick sidewalks. Use ice melter to get rid of ice on walkways in the winter. Use a cordless phone. Questions to ask your health care provider Can you help me check my risk for a fall? Do any of my medicines make me more likely to fall? Should I take a vitamin D supplement? What exercises can I do to improve my strength and balance? Should I make an appointment to have my vision checked? Do I need a bone density test to check for weak bones (osteoporosis)? Would it help to use a cane or a walker? Where to find more information Centers for Disease Control and Prevention, STEADI: TonerPromos.no Community-Based Fall Prevention Programs: TonerPromos.no [employer] on Aging: BaseRingTones.pl Contact a health care provider if: You fall at home. You are afraid of falling at home. You feel weak, drowsy, or dizzy. This information is not intended to replace advice given to you by your health care provider. Make sure you discuss any  questions you have with your health care provider. Document Revised: 06/11/2022 Document Reviewed: 06/11/2022 Elsevier Patient Education  2024 [employer].

## 2024-02-04 NOTE — Telephone Encounter (Signed)
*  STAT* If patient is at the pharmacy, call can be transferred to refill team.   1. Which medications need to be refilled? (please list name of each medication and dose if known)   metoprolol tartrate (LOPRESSOR) 100 MG tablet    2. Which pharmacy/location (including street and city if local pharmacy) is medication to be sent to? [facility] [address], [place] - [id number] [place] [address] Phone: [phone]  Fax: [phone]     3. Do they need a 30 day or 90 day supply? 90 Pt has 3 days left

## 2024-02-13 ENCOUNTER — Inpatient Hospital Stay: Payer: Medicare Other

## 2024-02-13 DIAGNOSIS — B351 Tinea unguium: Secondary | ICD-10-CM | POA: Diagnosis not present

## 2024-02-13 DIAGNOSIS — E1142 Type 2 diabetes mellitus with diabetic polyneuropathy: Secondary | ICD-10-CM | POA: Diagnosis not present

## 2024-02-20 ENCOUNTER — Inpatient Hospital Stay: Payer: Medicare Other | Admitting: Oncology

## 2024-04-23 DIAGNOSIS — B351 Tinea unguium: Secondary | ICD-10-CM | POA: Diagnosis not present

## 2024-04-23 DIAGNOSIS — E1142 Type 2 diabetes mellitus with diabetic polyneuropathy: Secondary | ICD-10-CM | POA: Diagnosis not present

## 2024-05-06 ENCOUNTER — Encounter: Payer: Self-pay | Admitting: Nurse Practitioner

## 2024-05-06 ENCOUNTER — Ambulatory Visit (INDEPENDENT_AMBULATORY_CARE_PROVIDER_SITE_OTHER): Admitting: Nurse Practitioner

## 2024-05-06 VITALS — BP 132/78 | HR 60 | Ht 67.0 in | Wt 215.2 lb

## 2024-05-06 DIAGNOSIS — Z7984 Long term (current) use of oral hypoglycemic drugs: Secondary | ICD-10-CM | POA: Diagnosis not present

## 2024-05-06 DIAGNOSIS — N182 Chronic kidney disease, stage 2 (mild): Secondary | ICD-10-CM

## 2024-05-06 DIAGNOSIS — E559 Vitamin D deficiency, unspecified: Secondary | ICD-10-CM | POA: Diagnosis not present

## 2024-05-06 DIAGNOSIS — E1122 Type 2 diabetes mellitus with diabetic chronic kidney disease: Secondary | ICD-10-CM

## 2024-05-06 DIAGNOSIS — N1831 Chronic kidney disease, stage 3a: Secondary | ICD-10-CM | POA: Diagnosis not present

## 2024-05-06 DIAGNOSIS — Z794 Long term (current) use of insulin: Secondary | ICD-10-CM | POA: Diagnosis not present

## 2024-05-06 DIAGNOSIS — E782 Mixed hyperlipidemia: Secondary | ICD-10-CM

## 2024-05-06 DIAGNOSIS — I10 Essential (primary) hypertension: Secondary | ICD-10-CM

## 2024-05-06 DIAGNOSIS — E039 Hypothyroidism, unspecified: Secondary | ICD-10-CM

## 2024-05-06 LAB — POCT GLYCOSYLATED HEMOGLOBIN (HGB A1C): Hemoglobin A1C: 7.6 % — AB (ref 4.0–5.6)

## 2024-05-06 MED ORDER — ONETOUCH ULTRASOFT LANCETS MISC: 11 refills | Status: AC

## 2024-05-06 MED ORDER — ONETOUCH ULTRA VI STRP: ORAL_STRIP | 11 refills | Status: DC

## 2024-05-06 NOTE — Patient Instructions (Signed)

## 2024-05-06 NOTE — Progress Notes (Signed)
 05/06/2024, 9:42 AM  Endocrinology follow-up note  Subjective:    Patient ID: Kimberly Freeman, female    DOB: 1941-01-16.  Kimberly Freeman is being seen in consultation for management of currently uncontrolled symptomatic diabetes requested by  Antonetta Rollene BRAVO, MD.   Past Medical History:  Diagnosis Date   Arthritis    CAD (coronary artery disease) 08/2019   60% RI with other 20-30% lesions at cath   Diabetes mellitus, type 2 (HCC)    Essential hypertension    GERD (gastroesophageal reflux disease)    Hyperlipidemia    LVH (left ventricular hypertrophy)     Past Surgical History:  Procedure Laterality Date   ABDOMINAL HYSTERECTOMY     BIOPSY  07/18/2017   Procedure: BIOPSY;  Surgeon: Harvey Margo CROME, MD;  Location: [facility];  Service: Endoscopy;;  gastric   CHOLECYSTECTOMY  2004   COLONOSCOPY N/A 07/18/2017   Dr. harvey: Internal and external hemorrhoids.  No future screening/surveillance colonoscopies due to age.   ESOPHAGOGASTRODUODENOSCOPY N/A 07/18/2017   Dr. harvey: Small hiatal hernia, patchy mild inflammation characterized by congestion, erosions, erythema in the cardia, gastric body and antrum.  Diffuse moderate inflammation in the duodenal bulb and second portion duodenum.  Biopsies benign.  No H. pylori.   LEFT HEART CATH AND CORONARY ANGIOGRAPHY N/A 09/21/2019   Procedure: LEFT HEART CATH AND CORONARY ANGIOGRAPHY;  Surgeon: Burnard Debby LABOR, MD;  Location: [facility];  Service: Cardiovascular;  Laterality: N/A;    Social History   Socioeconomic History   Marital status: Married    Spouse name: Kimberly Freeman    Number of children: 0   Years of education: 12+   Highest education level: 12th grade  Occupational History   Occupation: retired   Tobacco Use   Smoking status: Never   Smokeless tobacco: Never  Vaping Use   Vaping status: Never Used  Substance and Sexual Activity    Alcohol use: No    Alcohol/week: 0.0 standard drinks of alcohol   Drug use: No   Sexual activity: Not on file  Other Topics Concern   Not on file  Social History Narrative   Lives alone with husband    Social Drivers of Health   Financial Resource Strain: Low Risk  (02/04/2024)   Overall Financial Resource Strain (CARDIA)    Difficulty of Paying Living Expenses: Not hard at all  Food Insecurity: No Food Insecurity (02/04/2024)   Hunger Vital Sign    Worried About Running Out of Food in the Last Year: Never true    Ran Out of Food in the Last Year: Never true  Transportation Needs: No Transportation Needs (02/04/2024)   PRAPARE - Administrator, Civil Service (Medical): No    Lack of Transportation (Non-Medical): No  Physical Activity: Insufficiently Active (02/04/2024)   Exercise Vital Sign    Days of Exercise per Week: 7 days    Minutes of Exercise per Session: 20 min  Stress: No Stress Concern Present (02/04/2024)   [employer] of Occupational Health - Occupational Stress Questionnaire    Feeling of Stress : Not at all  Social Connections: Socially Integrated (  02/04/2024)   Social Connection and Isolation Panel    Frequency of Communication with Friends and Family: More than three times a week    Frequency of Social Gatherings with Friends and Family: More than three times a week    Attends Religious Services: More than 4 times per year    Active Member of [employer] or Organizations: Yes    Attends Engineer, structural: More than 4 times per year    Marital Status: Married    Family History  Problem Relation Age of Onset   Leukemia Mother    Cancer Mother    Diabetes Mother    Prostate cancer Father    Lung cancer Sister 9   Diabetes Sister    Colon cancer Neg Hx     Outpatient Encounter Medications as of 05/06/2024  Medication Sig   acetaminophen  (TYLENOL ) 500 MG tablet Take 500 mg by mouth daily as needed for mild pain or moderate pain. For pain    amLODipine  (NORVASC ) 10 MG tablet TAKE 1 TABLET DAILY (DOSE INCREASE)   aspirin  81 MG chewable tablet Chew 1 tablet (81 mg total) by mouth daily.   atorvastatin  (LIPITOR) 80 MG tablet TAKE 1 TABLET DAILY AT 6 P.M.   BD PEN NEEDLE MICRO U/F 32G X 6 MM MISC USE TO INJECT INSULIN  DAILY   betamethasone  dipropionate 0.05 % cream Apply topically 2 (two) times daily.   calcium -vitamin D  (OSCAL WITH D) 500-200 MG-UNIT tablet Take 1 tablet by mouth.   carvedilol  (COREG ) 3.125 MG tablet Take 1 tablet (3.125 mg total) by mouth 2 (two) times daily with a meal.   Easy Touch Lancets 28G MISC USE TO TEST TWICE A DAY   glipiZIDE  (GLUCOTROL  XL) 2.5 MG 24 hr tablet Take 1 tablet (2.5 mg total) by mouth daily with breakfast.   insulin  degludec (TRESIBA  FLEXTOUCH) 100 UNIT/ML FlexTouch Pen Inject 28 Units into the skin at bedtime.   irbesartan  (AVAPRO ) 300 MG tablet TAKE 1 TABLET DAILY   levothyroxine  (SYNTHROID ) 50 MCG tablet Take 1 tablet (50 mcg total) by mouth daily before breakfast.   metoprolol  tartrate (LOPRESSOR ) 100 MG tablet Take 1 tablet (100 mg total) by mouth 2 (two) times daily.   Multiple Vitamin (MULTIVITAMIN WITH MINERALS) TABS tablet Take 1 tablet by mouth daily.   pantoprazole  (PROTONIX ) 40 MG tablet Take 40 mg by mouth daily.   polyethylene glycol (MIRALAX  / GLYCOLAX ) 17 g packet Take 17 g by mouth daily as needed.   spironolactone  (ALDACTONE ) 50 MG tablet Take 1 tablet (50 mg total) by mouth 2 (two) times daily.   spironolactone  (ALDACTONE ) 50 MG tablet Take 1 tablet (50 mg total) by mouth 2 (two) times daily.   [DISCONTINUED] glucose blood (ONETOUCH ULTRA) test strip Use as instructed   [DISCONTINUED] Lancets (ONETOUCH ULTRASOFT) lancets USE AS DIRECTED TWICE DAILY   glucose blood (ONETOUCH ULTRA) test strip Use as instructed to monitor glucose twice daily   Lancets (ONETOUCH ULTRASOFT) lancets USE AS DIRECTED to check glucose TWICE DAILY   No facility-administered encounter medications  on file as of 05/06/2024.    ALLERGIES: Allergies  Allergen Reactions   Poison Oak Extract [Poison Oak Extract] Hives   Sulfonamide Derivatives Itching    Burning sensation all over body   Levemir  [Insulin  Detemir] Rash    VACCINATION STATUS: Immunization History  Administered Date(s) Administered   Fluad Quad(high Dose 65+) 07/27/2019, 06/23/2020, 06/29/2021, 08/01/2022   Fluad Trivalent(High Dose 65+) 07/16/2023   Influenza Split 08/01/2011, 07/31/2012  Influenza Whole 07/29/2007, 07/20/2008, 07/21/2009, 07/19/2010   Influenza, High Dose Seasonal PF 07/23/2018   Influenza,inj,Quad PF,6+ Mos 08/07/2013, 06/15/2014, 07/19/2015, 06/28/2016, 07/25/2017   Moderna SARS-COV2 Booster Vaccination 07/26/2021   Moderna Sars-Covid-2 Vaccination 12/22/2019, 01/19/2020, 09/25/2020, 01/29/2021   Pneumococcal Conjugate-13 10/12/2014   Pneumococcal Polysaccharide-23 03/16/2004, 08/29/2012   Td 03/16/2004   Tdap 03/31/2012   Zoster, Live 01/02/2007    Diabetes She presents for her follow-up diabetic visit. She has type 2 diabetes mellitus. Onset time: She was diagnosed at approximate age of 83 years. Her disease course has been stable. There are no hypoglycemic associated symptoms. Pertinent negatives for hypoglycemia include no confusion, headaches, pallor or seizures. Associated symptoms include fatigue. Pertinent negatives for diabetes include no chest pain, no polydipsia, no polyphagia and no polyuria. There are no hypoglycemic complications. Symptoms are stable. Diabetic complications include heart disease and nephropathy. Risk factors for coronary artery disease include diabetes mellitus, dyslipidemia, family history, hypertension, obesity, sedentary lifestyle and post-menopausal. Current diabetic treatment includes insulin  injections and oral agent (monotherapy). She is compliant with treatment most of the time. Her weight is fluctuating minimally. She is following a generally healthy diet.  When asked about meal planning, she reported none. She has had a previous visit with a dietitian. She participates in exercise intermittently. Her home blood glucose trend is fluctuating minimally. Her breakfast blood glucose range is generally 110-130 mg/dl. Her overall blood glucose range is 110-130 mg/dl. (She presents today with her meter and logs showing at goal glycemic profile overall.  Her POCT A1c today is 7.6%, unchanged from last visit.  Analysis of her meter shows 7-day average of 114, 14-day average of 116, 30-day average of 123.  She denies any hypoglycemia. ) An ACE inhibitor/angiotensin II receptor blocker is not being taken. She sees a podiatrist.Eye exam is current.  Hyperlipidemia This is a chronic problem. The current episode started more than 1 year ago. The problem is controlled. Recent lipid tests were reviewed and are normal. Exacerbating diseases include chronic renal disease, diabetes and obesity. Factors aggravating her hyperlipidemia include beta blockers. Pertinent negatives include no chest pain, myalgias or shortness of breath. Current antihyperlipidemic treatment includes statins. The current treatment provides mild improvement of lipids. There are no compliance problems.  Risk factors for coronary artery disease include diabetes mellitus, dyslipidemia, obesity, hypertension, a sedentary lifestyle and post-menopausal.  Hypertension This is a chronic problem. The current episode started more than 1 year ago. The problem has been resolved since onset. The problem is controlled. Pertinent negatives include no chest pain, headaches, palpitations or shortness of breath. There are no associated agents to hypertension. Risk factors for coronary artery disease include sedentary lifestyle, obesity, post-menopausal state, diabetes mellitus and dyslipidemia. Past treatments include calcium  channel blockers, beta blockers and diuretics. The current treatment provides mild improvement. There  are no compliance problems.  Hypertensive end-organ damage includes kidney disease and CAD/MI. Identifiable causes of hypertension include chronic renal disease.    Review of systems  Constitutional: + fluctuating body weight,  current Body mass index is 33.71 kg/m. , no fatigue, no subjective hyperthermia, no subjective hypothermia Eyes: no blurry vision, no xerophthalmia ENT: no sore throat, no nodules palpated in throat, no dysphagia/odynophagia, no hoarseness Cardiovascular: no chest pain, no shortness of breath, no palpitations, no leg swelling Respiratory: no cough, no shortness of breath Gastrointestinal: no nausea/vomiting/diarrhea Musculoskeletal: no muscle/joint aches Skin: no rashes, no hyperemia Neurological: no tremors, no numbness, no tingling, no dizziness Psychiatric: no depression, no anxiety  Objective:    BP 132/78 (BP Location: Left Arm, Patient Position: Sitting, Cuff Size: Large)   Pulse 60   Ht 5' 7 (1.702 m)   Wt 215 lb 3.2 oz (97.6 kg)   BMI 33.71 kg/m   Wt Readings from Last 3 Encounters:  05/06/24 215 lb 3.2 oz (97.6 kg)  02/04/24 201 lb (91.2 kg)  01/23/24 211 lb 1.3 oz (95.7 kg)    BP Readings from Last 3 Encounters:  05/06/24 132/78  01/23/24 (!) 140/80  01/02/24 (!) 140/70     Physical Exam- Limited  Constitutional:  Body mass index is 33.71 kg/m. , not in acute distress, normal state of mind Eyes:  EOMI, no exophthalmos Musculoskeletal: no gross deformities, strength intact in all four extremities, no gross restriction of joint movements Skin:  no rashes, no hyperemia Neurological: no tremor with outstretched hands  Diabetic Foot Exam - Simple   No data filed      CMP     Component Value Date/Time   NA 141 01/21/2024 1147   K 4.5 01/21/2024 1147   CL 103 01/21/2024 1147   CO2 22 01/21/2024 1147   GLUCOSE 367 (H) 01/21/2024 1147   GLUCOSE 128 (H) 02/01/2023 1259   BUN 29 (H) 01/21/2024 1147   CREATININE 1.30 (H)  01/21/2024 1147   CREATININE 1.14 (H) 12/15/2019 1048   CALCIUM  9.8 01/21/2024 1147   PROT 6.9 12/26/2023 0854   ALBUMIN 4.4 12/26/2023 0854   AST 18 12/26/2023 0854   ALT 18 12/26/2023 0854   ALKPHOS 108 12/26/2023 0854   BILITOT 0.3 12/26/2023 0854   GFRNONAA 50 (L) 02/01/2023 1259   GFRNONAA 45 (L) 12/15/2019 1048   GFRAA 48 (L) 09/05/2020 0810   GFRAA 53 (L) 12/15/2019 1048     Diabetic Labs (most recent): Lab Results  Component Value Date   HGBA1C 7.6 (A) 05/06/2024   HGBA1C 7.6 (A) 01/02/2024   HGBA1C 7.8 (A) 09/24/2023   MICROALBUR 1.5 07/24/2019   MICROALBUR 0.7 04/02/2018   MICROALBUR 8.8 (H) 11/27/2017     Lipid Panel ( most recent) Lipid Panel     Component Value Date/Time   CHOL 120 11/20/2023 0901   TRIG 87 11/20/2023 0901   HDL 38 (L) 11/20/2023 0901   CHOLHDL 3.2 11/20/2023 0901   CHOLHDL 3.3 12/15/2019 1048   VLDL 21 03/22/2017 0803   LDLCALC 65 11/20/2023 0901   LDLCALC 80 12/15/2019 1048   LABVLDL 17 11/20/2023 0901      Lab Results  Component Value Date   TSH 3.070 12/26/2023   TSH 2.530 11/20/2023   TSH 3.650 05/16/2023   TSH 0.849 09/12/2022   TSH 4.710 (H) 05/14/2022   TSH 4.290 12/25/2021   TSH 8.400 (H) 08/29/2021   TSH 7.640 (H) 04/28/2021   TSH 4.360 05/23/2020   TSH 6.85 (H) 07/24/2019   FREET4 1.03 12/26/2023   FREET4 1.01 05/16/2023   FREET4 1.05 09/12/2022   FREET4 0.79 (L) 05/14/2022   FREET4 0.83 12/25/2021   FREET4 0.75 (L) 08/29/2021   FREET4 0.75 (L) 04/28/2021   FREET4 1.0 12/15/2019   FREET4 0.8 07/24/2019   FREET4 0.9 08/04/2018      Latest Reference Range & Units 12/25/21 08:45 05/14/22 09:11 09/12/22 09:12 05/16/23 07:59 11/20/23 09:01 12/26/23 08:54  TSH 0.450 - 4.500 uIU/mL 4.290 4.710 (H) 0.849 3.650 2.530 3.070  T4,Free(Direct) 0.82 - 1.77 ng/dL 9.16 9.20 (L) 8.94 8.98  1.03  (H): Data is abnormally high (L): Data is abnormally  low  Assessment & Plan:   1) Type 2 diabetes mellitus with stage 2  chronic kidney disease, with long-term current use of insulin  (HCC)  - ILEEN KAHRE has currently uncontrolled symptomatic type 2 DM since  83 years of age. Recent labs reviewed.  She presents today with her meter and logs showing at goal glycemic profile overall.  Her POCT A1c today is 7.6%, unchanged from last visit.  Analysis of her meter shows 7-day average of 114, 14-day average of 116, 30-day average of 123.  She denies any hypoglycemia.   - I had a long discussion with her about the progressive nature of diabetes and the pathology behind its complications. -her diabetes is complicated by stage 2 CKD, obesity /sedentary life, and she remains at a high risk for more acute and chronic complications which include CAD, CVA, CKD, retinopathy, and neuropathy. These are all discussed in detail with her.  - Nutritional counseling repeated at each appointment due to patients tendency to fall back in to old habits.  - The patient admits there is a room for improvement in their diet and drink choices. -  Suggestion is made for the patient to avoid simple carbohydrates from their diet including Cakes, Sweet Desserts / Pastries, Ice Cream, Soda (diet and regular), Sweet Tea, Candies, Chips, Cookies, Sweet Pastries, Store Bought Juices, Alcohol in Excess of 1-2 drinks a day, Artificial Sweeteners, Coffee Creamer, and Sugar-free Products. This will help patient to have stable blood glucose profile and potentially avoid unintended weight gain.   - I encouraged the patient to switch to unprocessed or minimally processed complex starch and increased protein intake (animal or plant source), fruits, and vegetables.   - Patient is advised to stick to a routine mealtimes to eat 3 meals a day and avoid unnecessary snacks (to snack only to correct hypoglycemia).  - I have approached her with the following individualized plan to manage  her diabetes and patient agrees:   - she will continue to need at least  basal insulin  in order for her to achieve control of diabetes to target.    -Given her stable glycemic profile and lack of hypoglycemia, no changes will be made to her medications today.  She is advised to continue Tresiba  28 units SQ nightly and Glipizide  2.5 mg XL daily with breakfast.    -She is encouraged to continue monitoring glucose twice daily (consistently), before breakfast and before bed, and to call the clinic if she has readings less than 70 or greater than 300 for 3 tests in a row.  I sent refill on strips and lancets to [facility] for her.  -She will be considered for incretin therapy during her next visit.  - Specific targets for  A1c;  LDL, HDL,  and Triglycerides were discussed with the patient.  2) Blood Pressure /Hypertension:  Her blood pressure is controlled to target for her age (she also notes she just took her BP meds prior to the visit today).  She is advised to continue Norvasc  10 mg po daily, Irbesartan  300 mg po daily, Metoprolol  100 mg po twice daily, and Aldactone  50 mg po daily.  3) Lipids/Hyperlipidemia:    Her most recent lipid panel from 11/20/23 shows controlled LDL at 65.  She is advised to continue Atorvastatin  80 mg po daily at bedtime.  Side effects and precautions discussed with her.  4)  Weight/Diet:  Her Body mass index is 33.71 kg/m.  -   clearly complicating her diabetes  care.   she is  a candidate for weight loss. I discussed with her the fact that loss of 5 - 10% of her  current body weight will have the most impact on her diabetes management.  Exercise, and detailed carbohydrates information provided  -  detailed on discharge instructions.  5) Hypothyroidism- unspecified There are no recent TFTs to review.  She is advised to continue Levothyroxine  50 mcg po daily before breakfast. Will recheck TFTs prior to next visit and adjust dose accordingly.   - The correct intake of thyroid  hormone (Levothyroxine , Synthroid ), is on empty stomach  first thing in the morning, with water , separated by at least 30 minutes from breakfast and other medications,  and separated by more than 4 hours from calcium , iron, multivitamins, acid reflux medications (PPIs).  - This medication is a life-long medication and will be needed to correct thyroid  hormone imbalances for the rest of your life.  The dose may change from time to time, based on thyroid  blood work.  - It is extremely important to be consistent taking this medication, near the same time each morning.  -AVOID TAKING PRODUCTS CONTAINING BIOTIN (commonly found in Hair, Skin, Nails vitamins) AS IT INTERFERES WITH THE VALIDITY OF THYROID  FUNCTION BLOOD TESTS.  6) Chronic Care/Health Maintenance: -she is on Statin medications and  is encouraged to initiate and continue to follow up with Ophthalmology, Dentist,  Podiatrist at least yearly or according to recommendations, and advised to stay away from smoking. I have recommended yearly flu vaccine and pneumonia vaccine at least every 5 years; moderate intensity exercise for up to 150 minutes weekly; and  sleep for at least 7 hours a day.  - she is advised to maintain close follow up with Antonetta Rollene BRAVO, MD for primary care needs, as well as her other providers for optimal and coordinated care.      I spent  24  minutes in the care of the patient today including review of labs from CMP, Lipids, Thyroid  Function, Hematology (current and previous including abstractions from other facilities); face-to-face time discussing  her blood glucose readings/logs, discussing hypoglycemia and hyperglycemia episodes and symptoms, medications doses, her options of short and long term treatment based on the latest standards of care / guidelines;  discussion about incorporating lifestyle medicine;  and documenting the encounter. Risk reduction counseling performed per USPSTF guidelines to reduce obesity and cardiovascular risk factors.     Please refer to  Patient Instructions for Blood Glucose Monitoring and Insulin /Medications Dosing Guide  in media tab for additional information. Please  also refer to  Patient Self Inventory in the Media  tab for reviewed elements of pertinent patient history.  Arlean CHRISTELLA Shed participated in the discussions, expressed understanding, and voiced agreement with the above plans.  All questions were answered to her satisfaction. she is encouraged to contact clinic should she have any questions or concerns prior to her return visit.   Follow up plan: - Return in about 4 months (around 09/06/2024) for Diabetes F/U with A1c in office, Previsit labs, Bring meter and logs.  Benton Rio, [facility] [address] [place], [place] [address] Phone: [phone] Fax: [phone]  05/06/2024, 9:42 AM

## 2024-05-24 ENCOUNTER — Other Ambulatory Visit: Payer: Self-pay | Admitting: Family Medicine

## 2024-06-08 ENCOUNTER — Other Ambulatory Visit: Payer: Self-pay | Admitting: Family Medicine

## 2024-06-24 ENCOUNTER — Encounter: Payer: Self-pay | Admitting: Family Medicine

## 2024-06-24 ENCOUNTER — Ambulatory Visit (INDEPENDENT_AMBULATORY_CARE_PROVIDER_SITE_OTHER): Payer: Self-pay | Admitting: Family Medicine

## 2024-06-24 VITALS — BP 150/78 | HR 61 | Resp 16 | Ht 67.0 in | Wt 213.0 lb

## 2024-06-24 DIAGNOSIS — F439 Reaction to severe stress, unspecified: Secondary | ICD-10-CM

## 2024-06-24 DIAGNOSIS — E1165 Type 2 diabetes mellitus with hyperglycemia: Secondary | ICD-10-CM

## 2024-06-24 DIAGNOSIS — I10 Essential (primary) hypertension: Secondary | ICD-10-CM

## 2024-06-24 DIAGNOSIS — E785 Hyperlipidemia, unspecified: Secondary | ICD-10-CM | POA: Diagnosis not present

## 2024-06-24 DIAGNOSIS — Z23 Encounter for immunization: Secondary | ICD-10-CM | POA: Diagnosis not present

## 2024-06-24 DIAGNOSIS — Z794 Long term (current) use of insulin: Secondary | ICD-10-CM

## 2024-06-24 DIAGNOSIS — E038 Other specified hypothyroidism: Secondary | ICD-10-CM | POA: Diagnosis not present

## 2024-06-24 NOTE — Patient Instructions (Addendum)
 F/U in 6 months  Need to take carvedilol  as prescribed two times daily, blood pressure is high  Flu vaccine in office today.  Please get fasting lipid panel on the same day that you are getting blood work for your endocrinologist.  You are encouraged to get your COVID-vaccine at the pharmacy as well as the tetanus vaccine and shingles vaccines.  It is important that you exercise regularly at least 30 minutes 5 times a week. If you develop chest pain, have severe difficulty breathing, or feel very tired, stop exercising immediately and seek medical attention    Think about what you will eat, plan ahead. Choose  clean, green, fresh or frozen over canned, processed or packaged foods which are more sugary, salty and fatty. 70 to 75% of food eaten should be vegetables and fruit. Three meals at set times with snacks allowed between meals, but they must be fruit or vegetables. Aim to eat over a 12 hour period , example 7 am to 7 pm, and STOP after  your last meal of the day. Drink water ,generally about 64 ounces per day, no other drink is as healthy. Fruit juice is best enjoyed in a healthy way, by EATING the fruit.   Thanks for choosing [facility], we consider it a privelige to serve you.

## 2024-06-30 ENCOUNTER — Encounter: Payer: Self-pay | Admitting: Family Medicine

## 2024-06-30 DIAGNOSIS — Z23 Encounter for immunization: Secondary | ICD-10-CM | POA: Insufficient documentation

## 2024-06-30 NOTE — Assessment & Plan Note (Signed)
 Diabetes associated with hypertension and hyperlipidemia Mnaged by endo adequately controlled  Kimberly Freeman is reminded of the importance of commitment to daily physical activity for 30 minutes or more, as able and the need to limit carbohydrate intake to 30 to 60 grams per meal to help with blood sugar control.   The need to take medication as prescribed, test blood sugar as directed, and to call between visits if there is a concern that blood sugar is uncontrolled is also discussed.   Kimberly Freeman is reminded of the importance of daily foot exam, annual eye examination, and good blood sugar, blood pressure and cholesterol control.     Latest Ref Rng & Units 05/06/2024    9:39 AM 01/21/2024   11:47 AM 01/02/2024    8:38 AM 12/26/2023    8:54 AM 11/20/2023    9:01 AM  Diabetic Labs  HbA1c 4.0 - 5.6 % 7.6   7.6     Micro/Creat Ratio 0 - 29 mg/g creat     108   Chol 100 - 199 mg/dL     879   HDL >60 mg/dL     38   Calc LDL 0 - 99 mg/dL     65   Triglycerides 0 - 149 mg/dL     87   Creatinine 9.42 - 1.00 mg/dL  8.69   8.65  8.61       06/24/2024   10:43 AM 06/24/2024    9:56 AM 05/06/2024    9:27 AM 02/04/2024   11:01 AM 01/23/2024   11:02 AM 01/23/2024   10:31 AM 01/02/2024    8:26 AM  BP/Weight  Systolic BP 150 137 132 -- 140 146 140  Diastolic BP 78 62 78 -- 80 75 70  Wt. (Lbs)  213.04 215.2 201  211.08 212.2  BMI  33.37 kg/m2 33.71 kg/m2 31.48 kg/m2  33.06 kg/m2 33.24 kg/m2      Latest Ref Rng & Units 10/01/2023   12:00 AM 09/27/2022   12:00 AM  Foot/eye exam completion dates  Eye Exam No Retinopathy No Retinopathy     No Retinopathy         This result is from an external source.

## 2024-06-30 NOTE — Assessment & Plan Note (Signed)
Managed by Endo and controlled 

## 2024-06-30 NOTE — Assessment & Plan Note (Signed)
 Hyperlipidemia:Low fat diet discussed and encouraged.   Lipid Panel  Lab Results  Component Value Date   CHOL 120 11/20/2023   HDL 38 (L) 11/20/2023   LDLCALC 65 11/20/2023   TRIG 87 11/20/2023   CHOLHDL 3.2 11/20/2023     Controlled, no change in medication

## 2024-06-30 NOTE — Progress Notes (Signed)
 Kimberly Freeman     MRN: [id number]      DOB: [birth year]  Chief Complaint  Patient presents with   Hypertension    5 month follow up     HPI Kimberly Freeman is here for follow up and re-evaluation of chronic medical conditions, medication management and review of any available recent lab and radiology data.  Preventive health is updated, specifically  Cancer screening and Immunization.   Questions or concerns regarding consultations or procedures which the PT has had in the interim are  addressed. The PT denies any adverse reactions to current medications since the last visit.  There are no new concerns.  There are no specific complaints  Denies polyuria, polydipsia, blurred vision , or hypoglycemic episodes.   ROS Denies recent fever or chills. Denies sinus pressure, nasal congestion, ear pain or sore throat. Denies chest congestion, productive cough or wheezing. Denies chest pains, palpitations and leg swelling Denies abdominal pain, nausea, vomiting,diarrhea or constipation.   Denies dysuria, frequency, hesitancy or incontinence. Denies joint pain, swelling and limitation in mobility. Denies headaches, seizures, numbness, or tingling. Denies depression, anxiety or insomnia. Denies skin break down or rash.   PE  BP (!) 150/78   Pulse 61   Resp 16   Ht 5' 7 (1.702 m)   Wt 213 lb 0.6 oz (96.6 kg)   SpO2 93%   BMI 33.37 kg/m   Patient alert and oriented and in no cardiopulmonary distress.  HEENT: No facial asymmetry, EOMI,     Neck supple .  Chest: Clear to auscultation bilaterally.  CVS: S1, S2 no murmurs, no S3.Regular rate.  ABD: Soft non tender.   Ext: No edema  MS: Adequate ROM spine, shoulders, hips and knees.  Skin: Intact, no ulcerations or rash noted.  Psych: Good eye contact, normal affect. Memory intact not anxious or depressed appearing.  CNS: CN 2-12 intact, power,  normal throughout.no focal deficits noted.   Assessment & Plan  Malignant  hypertension DASH diet and commitment to daily physical activity for a minimum of 30 minutes discussed and encouraged, as a part of hypertension management. The importance of attaining a healthy weight is also discussed.     06/24/2024   10:43 AM 06/24/2024    9:56 AM 05/06/2024    9:27 AM 02/04/2024   11:01 AM 01/23/2024   11:02 AM 01/23/2024   10:31 AM 01/02/2024    8:26 AM  BP/Weight  Systolic BP 150 137 132 -- 140 146 140  Diastolic BP 78 62 78 -- 80 75 70  Wt. (Lbs)  213.04 215.2 201  211.08 212.2  BMI  33.37 kg/m2 33.71 kg/m2 31.48 kg/m2  33.06 kg/m2 33.24 kg/m2     No taking coreg  as doirected , needs to do so, not at goal  Uncontrolled type 2 diabetes mellitus with hyperglycemia, with long-term current use of insulin  (HCC) Diabetes associated with hypertension and hyperlipidemia Mnaged by endo adequately controlled  Kimberly Freeman is reminded of the importance of commitment to daily physical activity for 30 minutes or more, as able and the need to limit carbohydrate intake to 30 to 60 grams per meal to help with blood sugar control.   The need to take medication as prescribed, test blood sugar as directed, and to call between visits if there is a concern that blood sugar is uncontrolled is also discussed.   Kimberly Freeman is reminded of the importance of daily foot exam, annual eye examination, and good blood sugar,  blood pressure and cholesterol control.     Latest Ref Rng & Units 05/06/2024    9:39 AM 01/21/2024   11:47 AM 01/02/2024    8:38 AM 12/26/2023    8:54 AM 11/20/2023    9:01 AM  Diabetic Labs  HbA1c 4.0 - 5.6 % 7.6   7.6     Micro/Creat Ratio 0 - 29 mg/g creat     108   Chol 100 - 199 mg/dL     879   HDL >60 mg/dL     38   Calc LDL 0 - 99 mg/dL     65   Triglycerides 0 - 149 mg/dL     87   Creatinine 9.42 - 1.00 mg/dL  8.69   8.65  8.61       06/24/2024   10:43 AM 06/24/2024    9:56 AM 05/06/2024    9:27 AM 02/04/2024   11:01 AM 01/23/2024   11:02 AM 01/23/2024   10:31 AM  01/02/2024    8:26 AM  BP/Weight  Systolic BP 150 137 132 -- 140 146 140  Diastolic BP 78 62 78 -- 80 75 70  Wt. (Lbs)  213.04 215.2 201  211.08 212.2  BMI  33.37 kg/m2 33.71 kg/m2 31.48 kg/m2  33.06 kg/m2 33.24 kg/m2      Latest Ref Rng & Units 10/01/2023   12:00 AM 09/27/2022   12:00 AM  Foot/eye exam completion dates  Eye Exam No Retinopathy No Retinopathy     No Retinopathy         This result is from an external source.        Dyslipidemia, goal LDL below 70 Hyperlipidemia:Low fat diet discussed and encouraged.   Lipid Panel  Lab Results  Component Value Date   CHOL 120 11/20/2023   HDL 38 (L) 11/20/2023   LDLCALC 65 11/20/2023   TRIG 87 11/20/2023   CHOLHDL 3.2 11/20/2023     Controlled, no change in medication   Hypothyroid Managed by Endo and controlled  Stress at home Getting ,more as needed help from neighbors and friends , spouses's condition is gradually worsening  Immunization due After obtaining informed consent, the influenza  vaccine is  administered , with no adverse effect noted at the time of administration.

## 2024-06-30 NOTE — Assessment & Plan Note (Addendum)
 DASH diet and commitment to daily physical activity for a minimum of 30 minutes discussed and encouraged, as a part of hypertension management. The importance of attaining a healthy weight is also discussed.     06/24/2024   10:43 AM 06/24/2024    9:56 AM 05/06/2024    9:27 AM 02/04/2024   11:01 AM 01/23/2024   11:02 AM 01/23/2024   10:31 AM 01/02/2024    8:26 AM  BP/Weight  Systolic BP 150 137 132 -- 140 146 140  Diastolic BP 78 62 78 -- 80 75 70  Wt. (Lbs)  213.04 215.2 201  211.08 212.2  BMI  33.37 kg/m2 33.71 kg/m2 31.48 kg/m2  33.06 kg/m2 33.24 kg/m2     No taking coreg  as doirected , needs to do so, not at goal

## 2024-06-30 NOTE — Assessment & Plan Note (Signed)
 After obtaining informed consent, the influenza vaccine is  administered , with no adverse effect noted at the time of administration.

## 2024-06-30 NOTE — Assessment & Plan Note (Signed)
 Getting ,more as needed help from neighbors and friends , spouses's condition is gradually worsening

## 2024-07-14 DIAGNOSIS — E1142 Type 2 diabetes mellitus with diabetic polyneuropathy: Secondary | ICD-10-CM | POA: Diagnosis not present

## 2024-07-14 DIAGNOSIS — B351 Tinea unguium: Secondary | ICD-10-CM | POA: Diagnosis not present

## 2024-07-17 ENCOUNTER — Other Ambulatory Visit: Payer: Self-pay | Admitting: Family Medicine

## 2024-08-13 ENCOUNTER — Ambulatory Visit: Attending: Cardiology | Admitting: Cardiology

## 2024-08-13 ENCOUNTER — Encounter: Payer: Self-pay | Admitting: Cardiology

## 2024-08-13 VITALS — BP 130/60 | HR 56 | Ht 67.0 in | Wt 212.2 lb

## 2024-08-13 DIAGNOSIS — I350 Nonrheumatic aortic (valve) stenosis: Secondary | ICD-10-CM | POA: Insufficient documentation

## 2024-08-13 DIAGNOSIS — I10 Essential (primary) hypertension: Secondary | ICD-10-CM | POA: Insufficient documentation

## 2024-08-13 DIAGNOSIS — I25119 Atherosclerotic heart disease of native coronary artery with unspecified angina pectoris: Secondary | ICD-10-CM | POA: Insufficient documentation

## 2024-08-13 DIAGNOSIS — E782 Mixed hyperlipidemia: Secondary | ICD-10-CM | POA: Diagnosis not present

## 2024-08-13 NOTE — Patient Instructions (Signed)
 Medication Instructions:   Your physician recommends that you continue on your current medications as directed. Please refer to the Current Medication list given to you today.   Labwork: None today  Testing/Procedures: Your physician has requested that you have an echocardiogram in 1 year. Echocardiography is a painless test that uses sound waves to create images of your heart. It provides your doctor with information about the size and shape of your heart and how well your heart's chambers and valves are working. This procedure takes approximately one hour. There are no restrictions for this procedure. Please do NOT wear cologne, perfume, aftershave, or lotions (deodorant is allowed). Please arrive 15 minutes prior to your appointment time.  Please note: We ask at that you not bring children with you during ultrasound (echo/ vascular) testing. Due to room size and safety concerns, children are not allowed in the ultrasound rooms during exams. Our front office staff cannot provide observation of children in our lobby area while testing is being conducted. An adult accompanying a patient to their appointment will only be allowed in the ultrasound room at the discretion of the ultrasound technician under special circumstances. We apologize for any inconvenience.   Follow-Up: 1 year  Any Other Special Instructions Will Be Listed Below (If Applicable).  If you need a refill on your cardiac medications before your next appointment, please call your pharmacy.

## 2024-08-13 NOTE — Progress Notes (Signed)
    Cardiology Office Note  Date: 08/13/2024   ID: Kimberly Freeman, Kimberly Freeman 05-17-1941, MRN [id number]  History of Present Illness: Kimberly Freeman is an 83 y.o. female last seen in August 2024.  She is here for a routine visit.  She does not report any angina.  Stable NYHA class II dyspnea, no dizziness or syncope.  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  We went over her medications.  She reports compliance with current regimen, no obvious intolerances.  She has done well on Lipitor 80 mg daily with LDL down to 65 in January.  I reviewed her ECG today which shows sinus bradycardia with prolonged PR interval, right bundle branch block, and increased voltage with repolarization abnormalities.  Physical Exam: VS:  BP 130/60 (BP Location: Right Arm, Patient Position: Sitting, Cuff Size: Large)   Pulse (!) 56   Ht 5' 7 (1.702 m)   Wt 212 lb 3.2 oz (96.3 kg)   SpO2 96%   BMI 33.24 kg/m , BMI Body mass index is 33.24 kg/m.  Wt Readings from Last 3 Encounters:  08/13/24 212 lb 3.2 oz (96.3 kg)  06/24/24 213 lb 0.6 oz (96.6 kg)  05/06/24 215 lb 3.2 oz (97.6 kg)    General: Patient appears comfortable at rest. HEENT: Conjunctiva and lids normal. Neck: Supple, no elevated JVP or carotid bruits. Lungs: Clear to auscultation, nonlabored breathing at rest. Cardiac: Regular rate and rhythm, no S3, 2/6 systolic murmur. Extremities: Mild bilateral ankle edema.  ECG:  An ECG dated 06/20/2023 was personally reviewed today and demonstrated:  Sinus rhythm with right bundle branch block, increased voltage, repolarization abnormalities.  Labwork: 11/20/2023: Hemoglobin 11.4; Platelets 208 12/26/2023: ALT 18; AST 18; TSH 3.070 01/21/2024: BUN 29; Creatinine, Ser 1.30; Potassium 4.5; Sodium 141     Component Value Date/Time   CHOL 120 11/20/2023 0901   TRIG 87 11/20/2023 0901   HDL 38 (L) 11/20/2023 0901   CHOLHDL 3.2 11/20/2023 0901   CHOLHDL 3.3 12/15/2019 1048   VLDL 21 03/22/2017  0803   LDLCALC 65 11/20/2023 0901   LDLCALC 80 12/15/2019 1048   Other Studies Reviewed Today:  No interval cardiac testing for review today.  Assessment and Plan:  1.  CAD, mild to moderate by cardiac catheterization in November 2020.  LVEF 60 to 65%.  She continues to do well with no active angina.  ECG reviewed and stable with chronic abnormalities noted.  Continue aspirin  81 mg daily and Lipitor 80 mg daily.   2.  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  Cardiac murmur is stable.  We will plan a follow-up echocardiogram with clinical visit in 1 year.   3.  Primary hypertension.  Continue Aldactone  50 mg twice daily, Lopressor  100 mg twice daily, Avapro  300 mg daily and Norvasc  10 mg daily.  Keep follow-up with Dr. Antonetta.   4.  Mixed hyperlipidemia.  LDL 65 in January.  Continue Lipitor 80 mg daily.  Disposition:  Follow up 1 year.  Signed, Jayson JUDITHANN Sierras, M.D., F.A.C.C. [facility]

## 2024-08-26 ENCOUNTER — Other Ambulatory Visit (HOSPITAL_COMMUNITY): Payer: Self-pay | Admitting: Family Medicine

## 2024-08-26 DIAGNOSIS — Z1231 Encounter for screening mammogram for malignant neoplasm of breast: Secondary | ICD-10-CM

## 2024-09-03 DIAGNOSIS — Z794 Long term (current) use of insulin: Secondary | ICD-10-CM | POA: Diagnosis not present

## 2024-09-03 DIAGNOSIS — E039 Hypothyroidism, unspecified: Secondary | ICD-10-CM | POA: Diagnosis not present

## 2024-09-03 DIAGNOSIS — N1831 Chronic kidney disease, stage 3a: Secondary | ICD-10-CM | POA: Diagnosis not present

## 2024-09-03 DIAGNOSIS — E785 Hyperlipidemia, unspecified: Secondary | ICD-10-CM | POA: Diagnosis not present

## 2024-09-03 DIAGNOSIS — E1122 Type 2 diabetes mellitus with diabetic chronic kidney disease: Secondary | ICD-10-CM | POA: Diagnosis not present

## 2024-09-04 ENCOUNTER — Ambulatory Visit: Payer: Self-pay | Admitting: Family Medicine

## 2024-09-04 LAB — COMPREHENSIVE METABOLIC PANEL WITH GFR
ALT: 18 IU/L (ref 0–32)
AST: 19 IU/L (ref 0–40)
Albumin: 4.3 g/dL (ref 3.7–4.7)
Alkaline Phosphatase: 103 IU/L (ref 48–129)
BUN/Creatinine Ratio: 16 (ref 12–28)
BUN: 20 mg/dL (ref 8–27)
Bilirubin Total: 0.4 mg/dL (ref 0.0–1.2)
CO2: 26 mmol/L (ref 20–29)
Calcium: 9.9 mg/dL (ref 8.7–10.3)
Chloride: 107 mmol/L — ABNORMAL HIGH (ref 96–106)
Creatinine, Ser: 1.28 mg/dL — ABNORMAL HIGH (ref 0.57–1.00)
Globulin, Total: 2.6 g/dL (ref 1.5–4.5)
Glucose: 93 mg/dL (ref 70–99)
Potassium: 4.2 mmol/L (ref 3.5–5.2)
Sodium: 146 mmol/L — ABNORMAL HIGH (ref 134–144)
Total Protein: 6.9 g/dL (ref 6.0–8.5)
eGFR: 41 mL/min/1.73 — ABNORMAL LOW (ref >59)

## 2024-09-04 LAB — T4, FREE: Free T4: 0.92 ng/dL (ref 0.82–1.77)

## 2024-09-04 LAB — LIPID PANEL
Chol/HDL Ratio: 3.2 ratio (ref 0.0–4.4)
Cholesterol, Total: 121 mg/dL (ref 100–199)
HDL: 38 mg/dL — ABNORMAL LOW (ref >39)
LDL Chol Calc (NIH): 66 mg/dL (ref 0–99)
Triglycerides: 88 mg/dL (ref 0–149)
VLDL Cholesterol Cal: 17 mg/dL (ref 5–40)

## 2024-09-04 LAB — TSH: TSH: 3.19 u[IU]/mL (ref 0.450–4.500)

## 2024-09-07 ENCOUNTER — Encounter (HOSPITAL_COMMUNITY): Payer: Self-pay

## 2024-09-07 ENCOUNTER — Ambulatory Visit (HOSPITAL_COMMUNITY)
Admission: RE | Admit: 2024-09-07 | Discharge: 2024-09-07 | Disposition: A | Discharge status: Home / Self Care | Source: Ambulatory Visit | Attending: Family Medicine | Admitting: Family Medicine

## 2024-09-07 DIAGNOSIS — Z1231 Encounter for screening mammogram for malignant neoplasm of breast: Secondary | ICD-10-CM | POA: Insufficient documentation

## 2024-09-08 ENCOUNTER — Other Ambulatory Visit: Payer: Self-pay | Admitting: Family Medicine

## 2024-09-08 ENCOUNTER — Encounter: Payer: Self-pay | Admitting: Nurse Practitioner

## 2024-09-08 ENCOUNTER — Ambulatory Visit: Admitting: Nurse Practitioner

## 2024-09-08 VITALS — BP 110/62 | HR 60 | Ht 67.0 in | Wt 213.6 lb

## 2024-09-08 DIAGNOSIS — Z794 Long term (current) use of insulin: Secondary | ICD-10-CM | POA: Diagnosis not present

## 2024-09-08 DIAGNOSIS — N1831 Chronic kidney disease, stage 3a: Secondary | ICD-10-CM

## 2024-09-08 DIAGNOSIS — I10 Essential (primary) hypertension: Secondary | ICD-10-CM | POA: Diagnosis not present

## 2024-09-08 DIAGNOSIS — Z7984 Long term (current) use of oral hypoglycemic drugs: Secondary | ICD-10-CM

## 2024-09-08 DIAGNOSIS — E782 Mixed hyperlipidemia: Secondary | ICD-10-CM | POA: Diagnosis not present

## 2024-09-08 DIAGNOSIS — E1122 Type 2 diabetes mellitus with diabetic chronic kidney disease: Secondary | ICD-10-CM

## 2024-09-08 DIAGNOSIS — E039 Hypothyroidism, unspecified: Secondary | ICD-10-CM

## 2024-09-08 DIAGNOSIS — E559 Vitamin D deficiency, unspecified: Secondary | ICD-10-CM

## 2024-09-08 LAB — POCT GLYCOSYLATED HEMOGLOBIN (HGB A1C): Hemoglobin A1C: 8 % — AB (ref 4.0–5.6)

## 2024-09-08 MED ORDER — TRUE METRIX METER W/DEVICE KIT: PACK | 0 refills | Status: AC

## 2024-09-08 MED ORDER — LEVOTHYROXINE SODIUM 75 MCG PO TABS: 75.0000 ug | ORAL_TABLET | Freq: Every day | ORAL | 1 refills | Status: AC

## 2024-09-08 MED ORDER — TRUE METRIX BLOOD GLUCOSE TEST VI STRP: ORAL_STRIP | 12 refills | Status: AC

## 2024-09-08 NOTE — Progress Notes (Signed)
 09/08/2024, 11:08 AM  Endocrinology follow-up note  Subjective:    Patient ID: Kimberly Freeman, female    DOB: 1941/01/06.  Kimberly Freeman is being seen in consultation for management of currently uncontrolled symptomatic diabetes requested by  Antonetta Rollene BRAVO, MD.   Past Medical History:  Diagnosis Date   Arthritis    CAD (coronary artery disease) 08/2019   60% RI with other 20-30% lesions at cath   Diabetes mellitus, type 2 (HCC)    Essential hypertension    GERD (gastroesophageal reflux disease)    Hyperlipidemia    LVH (left ventricular hypertrophy)     Past Surgical History:  Procedure Laterality Date   ABDOMINAL HYSTERECTOMY     BIOPSY  07/18/2017   Procedure: BIOPSY;  Surgeon: Harvey Margo CROME, MD;  Location: [facility];  Service: Endoscopy;;  gastric   CHOLECYSTECTOMY  2004   COLONOSCOPY N/A 07/18/2017   Dr. harvey: Internal and external hemorrhoids.  No future screening/surveillance colonoscopies due to age.   ESOPHAGOGASTRODUODENOSCOPY N/A 07/18/2017   Dr. harvey: Small hiatal hernia, patchy mild inflammation characterized by congestion, erosions, erythema in the cardia, gastric body and antrum.  Diffuse moderate inflammation in the duodenal bulb and second portion duodenum.  Biopsies benign.  No H. pylori.   LEFT HEART CATH AND CORONARY ANGIOGRAPHY N/A 09/21/2019   Procedure: LEFT HEART CATH AND CORONARY ANGIOGRAPHY;  Surgeon: Burnard Debby LABOR, MD;  Location: [facility];  Service: Cardiovascular;  Laterality: N/A;    Social History   Socioeconomic History   Marital status: Married    Spouse name: Leonor    Number of children: 0   Years of education: 12+   Highest education level: 12th grade  Occupational History   Occupation: retired   Tobacco Use   Smoking status: Never   Smokeless tobacco: Never  Vaping Use   Vaping status: Never Used  Substance and Sexual Activity    Alcohol use: No    Alcohol/week: 0.0 standard drinks of alcohol   Drug use: No   Sexual activity: Not on file  Other Topics Concern   Not on file  Social History Narrative   Lives alone with husband    Social Drivers of Health   Financial Resource Strain: Low Risk  (02/04/2024)   Overall Financial Resource Strain (CARDIA)    Difficulty of Paying Living Expenses: Not hard at all  Food Insecurity: No Food Insecurity (02/04/2024)   Hunger Vital Sign    Worried About Running Out of Food in the Last Year: Never true    Ran Out of Food in the Last Year: Never true  Transportation Needs: No Transportation Needs (02/04/2024)   PRAPARE - Administrator, Civil Service (Medical): No    Lack of Transportation (Non-Medical): No  Physical Activity: Insufficiently Active (02/04/2024)   Exercise Vital Sign    Days of Exercise per Week: 7 days    Minutes of Exercise per Session: 20 min  Stress: No Stress Concern Present (02/04/2024)   [employer] of Occupational Health - Occupational Stress Questionnaire    Feeling of Stress : Not at all  Social Connections: Socially Integrated (  02/04/2024)   Social Connection and Isolation Panel    Frequency of Communication with Friends and Family: More than three times a week    Frequency of Social Gatherings with Friends and Family: More than three times a week    Attends Religious Services: More than 4 times per year    Active Member of [employer] or Organizations: Yes    Attends Engineer, Structural: More than 4 times per year    Marital Status: Married    Family History  Problem Relation Age of Onset   Leukemia Mother    Cancer Mother    Diabetes Mother    Prostate cancer Father    Lung cancer Sister 58   Diabetes Sister    Colon cancer Neg Hx     Outpatient Encounter Medications as of 09/08/2024  Medication Sig   acetaminophen  (TYLENOL ) 500 MG tablet Take 500 mg by mouth daily as needed for mild pain or moderate pain. For  pain   amLODipine  (NORVASC ) 10 MG tablet TAKE 1 TABLET DAILY (DOSE INCREASE)   aspirin  81 MG chewable tablet Chew 1 tablet (81 mg total) by mouth daily.   atorvastatin  (LIPITOR) 80 MG tablet TAKE 1 TABLET DAILY AT 6 P.M.   betamethasone  dipropionate 0.05 % cream Apply topically 2 (two) times daily.   Blood Glucose Monitoring Suppl (TRUE METRIX METER) w/Device KIT Use to check glucose 2 times daily   calcium -vitamin D  (OSCAL WITH D) 500-200 MG-UNIT tablet Take 1 tablet by mouth.   carvedilol  (COREG ) 3.125 MG tablet Take 1 tablet (3.125 mg total) by mouth 2 (two) times daily with a meal.   Easy Touch Lancets 28G MISC USE TO TEST TWICE A DAY   EMBECTA PEN NEEDLE ULTRAFINE 32G X 6 MM MISC USE TO INJECT INSULIN  DAILY   glipiZIDE  (GLUCOTROL  XL) 2.5 MG 24 hr tablet Take 1 tablet (2.5 mg total) by mouth daily with breakfast.   glucose blood (TRUE METRIX BLOOD GLUCOSE TEST) test strip Use as instructed to monitor glucose twice daily   insulin  degludec (TRESIBA  FLEXTOUCH) 100 UNIT/ML FlexTouch Pen Inject 28 Units into the skin at bedtime.   irbesartan  (AVAPRO ) 300 MG tablet TAKE 1 TABLET DAILY   Lancets (ONETOUCH ULTRASOFT) lancets USE AS DIRECTED to check glucose TWICE DAILY   metoprolol  tartrate (LOPRESSOR ) 100 MG tablet Take 1 tablet (100 mg total) by mouth 2 (two) times daily.   Multiple Vitamin (MULTIVITAMIN WITH MINERALS) TABS tablet Take 1 tablet by mouth daily.   spironolactone  (ALDACTONE ) 50 MG tablet Take 1 tablet (50 mg total) by mouth 2 (two) times daily.   [DISCONTINUED] glucose blood (ONETOUCH ULTRA) test strip Use as instructed to monitor glucose twice daily   [DISCONTINUED] levothyroxine  (SYNTHROID ) 50 MCG tablet Take 1 tablet (50 mcg total) by mouth daily before breakfast.   levothyroxine  (SYNTHROID ) 75 MCG tablet Take 1 tablet (75 mcg total) by mouth daily before breakfast.   pantoprazole  (PROTONIX ) 40 MG tablet Take 40 mg by mouth daily. (Patient not taking: Reported on 09/08/2024)    polyethylene glycol (MIRALAX  / GLYCOLAX ) 17 g packet Take 17 g by mouth daily as needed.   spironolactone  (ALDACTONE ) 50 MG tablet TAKE 1 TABLET TWICE A DAY (DOSE INCREASED, DIRECTIONS CHANGED) (Patient not taking: Reported on 09/08/2024)   No facility-administered encounter medications on file as of 09/08/2024.    ALLERGIES: Allergies  Allergen Reactions   Poison Oak Extract [Poison Oak Extract] Hives   Sulfonamide Derivatives Itching    Burning sensation all  over body   Levemir  [Insulin  Detemir] Rash    VACCINATION STATUS: Immunization History  Administered Date(s) Administered   Fluad Quad(high Dose 65+) 07/27/2019, 06/23/2020, 06/29/2021, 08/01/2022   Fluad Trivalent(High Dose 65+) 07/16/2023   INFLUENZA, HIGH DOSE SEASONAL PF 07/23/2018, 06/24/2024   Influenza Split 08/01/2011, 07/31/2012   Influenza Whole 07/29/2007, 07/20/2008, 07/21/2009, 07/19/2010   Influenza,inj,Quad PF,6+ Mos 08/07/2013, 06/15/2014, 07/19/2015, 06/28/2016, 07/25/2017   Moderna SARS-COV2 Booster Vaccination 07/26/2021   Moderna Sars-Covid-2 Vaccination 12/22/2019, 01/19/2020, 09/25/2020, 01/29/2021   Pneumococcal Conjugate-13 10/12/2014   Pneumococcal Polysaccharide-23 03/16/2004, 08/29/2012   Td 03/16/2004   Tdap 03/31/2012   Zoster, Live 01/02/2007    Diabetes She presents for her follow-up diabetic visit. She has type 2 diabetes mellitus. Onset time: She was diagnosed at approximate age of 21 years. Her disease course has been stable. There are no hypoglycemic associated symptoms. Pertinent negatives for hypoglycemia include no confusion, pallor or seizures. Associated symptoms include fatigue. Pertinent negatives for diabetes include no polydipsia, no polyphagia and no polyuria. There are no hypoglycemic complications. Symptoms are stable. Diabetic complications include heart disease and nephropathy. Risk factors for coronary artery disease include diabetes mellitus, dyslipidemia, family history,  hypertension, obesity, sedentary lifestyle and post-menopausal. Current diabetic treatment includes insulin  injections and oral agent (monotherapy). She is compliant with treatment most of the time. Her weight is fluctuating minimally. She is following a generally healthy diet. When asked about meal planning, she reported none. She has had a previous visit with a dietitian. She participates in exercise intermittently. Her home blood glucose trend is fluctuating minimally. Her breakfast blood glucose range is generally 110-130 mg/dl. Her overall blood glucose range is 110-130 mg/dl. (She presents today with her meter and logs showing at goal glycemic profile overall.  Her POCT A1c today is 8%, increasing slightly from last visit of 7.6%.  Analysis of her meter shows 7-day average of 117, 14-day average of 119, 30-day average of 119.  She denies any hypoglycemia.  She did admit to over-eating during recent homecoming at her church.) An ACE inhibitor/angiotensin II receptor blocker is not being taken. She sees a podiatrist.Eye exam is current.    Review of systems  Constitutional: + fluctuating body weight,  current Body mass index is 33.45 kg/m. , no fatigue, no subjective hyperthermia, no subjective hypothermia Eyes: no blurry vision, no xerophthalmia ENT: no sore throat, no nodules palpated in throat, no dysphagia/odynophagia, no hoarseness Cardiovascular: no chest pain, no shortness of breath, no palpitations, no leg swelling Respiratory: no cough, no shortness of breath Gastrointestinal: no nausea/vomiting/diarrhea Musculoskeletal: no muscle/joint aches Skin: no rashes, no hyperemia Neurological: no tremors, no numbness, no tingling, no dizziness Psychiatric: no depression, no anxiety   Objective:    BP 110/62 (BP Location: Left Arm, Patient Position: Sitting, Cuff Size: Large)   Pulse 60   Ht 5' 7 (1.702 m)   Wt 213 lb 9.6 oz (96.9 kg)   BMI 33.45 kg/m   Wt Readings from Last 3  Encounters:  09/08/24 213 lb 9.6 oz (96.9 kg)  08/13/24 212 lb 3.2 oz (96.3 kg)  06/24/24 213 lb 0.6 oz (96.6 kg)    BP Readings from Last 3 Encounters:  09/08/24 110/62  08/13/24 130/60  06/24/24 (!) 150/78     Physical Exam- Limited  Constitutional:  Body mass index is 33.45 kg/m. , not in acute distress, normal state of mind Eyes:  EOMI, no exophthalmos Musculoskeletal: no gross deformities, strength intact in all four extremities, no gross restriction of  joint movements Skin:  no rashes, no hyperemia Neurological: no tremor with outstretched hands    Diabetic Foot Exam - Simple   No data filed      CMP     Component Value Date/Time   NA 146 (H) 09/03/2024 0857   K 4.2 09/03/2024 0857   CL 107 (H) 09/03/2024 0857   CO2 26 09/03/2024 0857   GLUCOSE 93 09/03/2024 0857   GLUCOSE 128 (H) 02/01/2023 1259   BUN 20 09/03/2024 0857   CREATININE 1.28 (H) 09/03/2024 0857   CREATININE 1.14 (H) 12/15/2019 1048   CALCIUM  9.9 09/03/2024 0857   PROT 6.9 09/03/2024 0857   ALBUMIN 4.3 09/03/2024 0857   AST 19 09/03/2024 0857   ALT 18 09/03/2024 0857   ALKPHOS 103 09/03/2024 0857   BILITOT 0.4 09/03/2024 0857   GFRNONAA 50 (L) 02/01/2023 1259   GFRNONAA 45 (L) 12/15/2019 1048   GFRAA 48 (L) 09/05/2020 0810   GFRAA 53 (L) 12/15/2019 1048     Diabetic Labs (most recent): Lab Results  Component Value Date   HGBA1C 8.0 (A) 09/08/2024   HGBA1C 7.6 (A) 05/06/2024   HGBA1C 7.6 (A) 01/02/2024   MICROALBUR 1.5 07/24/2019   MICROALBUR 0.7 04/02/2018   MICROALBUR 8.8 (H) 11/27/2017     Lipid Panel ( most recent) Lipid Panel     Component Value Date/Time   CHOL 121 09/03/2024 0900   TRIG 88 09/03/2024 0900   HDL 38 (L) 09/03/2024 0900   CHOLHDL 3.2 09/03/2024 0900   CHOLHDL 3.3 12/15/2019 1048   VLDL 21 03/22/2017 0803   LDLCALC 66 09/03/2024 0900   LDLCALC 80 12/15/2019 1048   LABVLDL 17 09/03/2024 0900      Lab Results  Component Value Date   TSH 3.190  09/03/2024   TSH 3.070 12/26/2023   TSH 2.530 11/20/2023   TSH 3.650 05/16/2023   TSH 0.849 09/12/2022   TSH 4.710 (H) 05/14/2022   TSH 4.290 12/25/2021   TSH 8.400 (H) 08/29/2021   TSH 7.640 (H) 04/28/2021   TSH 4.360 05/23/2020   FREET4 0.92 09/03/2024   FREET4 1.03 12/26/2023   FREET4 1.01 05/16/2023   FREET4 1.05 09/12/2022   FREET4 0.79 (L) 05/14/2022   FREET4 0.83 12/25/2021   FREET4 0.75 (L) 08/29/2021   FREET4 0.75 (L) 04/28/2021   FREET4 1.0 12/15/2019   FREET4 0.8 07/24/2019      Latest Reference Range & Units 12/25/21 08:45 05/14/22 09:11 09/12/22 09:12 05/16/23 07:59 11/20/23 09:01 12/26/23 08:54  TSH 0.450 - 4.500 uIU/mL 4.290 4.710 (H) 0.849 3.650 2.530 3.070  T4,Free(Direct) 0.82 - 1.77 ng/dL 9.16 9.20 (L) 8.94 8.98  1.03  (H): Data is abnormally high (L): Data is abnormally low  Assessment & Plan:   1) Type 2 diabetes mellitus with stage 2 chronic kidney disease, with long-term current use of insulin  (HCC)  - Kimberly Freeman has currently uncontrolled symptomatic type 2 DM since  83 years of age. Recent labs reviewed.  She presents today with her meter and logs showing at goal glycemic profile overall.  Her POCT A1c today is 8%, increasing slightly from last visit of 7.6%.  Analysis of her meter shows 7-day average of 117, 14-day average of 119, 30-day average of 119.  She denies any hypoglycemia.  She did admit to over-eating during recent homecoming at her church.  - I had a long discussion with her about the progressive nature of diabetes and the pathology behind its complications. -her diabetes is  complicated by stage 2 CKD, obesity /sedentary life, and she remains at a high risk for more acute and chronic complications which include CAD, CVA, CKD, retinopathy, and neuropathy. These are all discussed in detail with her.  - Nutritional counseling repeated/built upon at each appointment.  - The patient admits there is a room for improvement in their diet and  drink choices. -  Suggestion is made for the patient to avoid simple carbohydrates from their diet including Cakes, Sweet Desserts / Pastries, Ice Cream, Soda (diet and regular), Sweet Tea, Candies, Chips, Cookies, Sweet Pastries, Store Bought Juices, Alcohol in Excess of 1-2 drinks a day, Artificial Sweeteners, Coffee Creamer, and Sugar-free Products. This will help patient to have stable blood glucose profile and potentially avoid unintended weight gain.   - I encouraged the patient to switch to unprocessed or minimally processed complex starch and increased protein intake (animal or plant source), fruits, and vegetables.   - Patient is advised to stick to a routine mealtimes to eat 3 meals a day and avoid unnecessary snacks (to snack only to correct hypoglycemia).  - I have approached her with the following individualized plan to manage  her diabetes and patient agrees:   - she will continue to need at least basal insulin  in order for her to achieve control of diabetes to target.    -Given her stable glycemic profile and lack of hypoglycemia, no changes will be made to her medications today.  She is advised to continue Tresiba  28 units SQ nightly and Glipizide  2.5 mg XL daily with breakfast.    -She is encouraged to continue monitoring glucose twice daily (consistently), before breakfast and before bed, and to call the clinic if she has readings less than 70 or greater than 300 for 3 tests in a row.  I sent in for True Metrix meter and strips to [facility], her insurance is changing the preferred meter come January.  -She will be considered for incretin therapy during her next visit.  - Specific targets for  A1c;  LDL, HDL,  and Triglycerides were discussed with the patient.  2) Blood Pressure /Hypertension:  Her blood pressure is controlled to target for her age.  She is advised to continue her current medications as prescribed by PCP.  3) Lipids/Hyperlipidemia:    Her most recent  lipid panel from 11/20/23 shows controlled LDL at 65.  She is advised to continue Atorvastatin  80 mg po daily at bedtime.  Side effects and precautions discussed with her.  4)  Weight/Diet:  Her Body mass index is 33.45 kg/m.  -   clearly complicating her diabetes care.   she is  a candidate for weight loss. I discussed with her the fact that loss of 5 - 10% of her  current body weight will have the most impact on her diabetes management.  Exercise, and detailed carbohydrates information provided  -  detailed on discharge instructions.  5) Hypothyroidism- unspecified Her previsit TFTs are consistent with slight under-replacement.   She is advised to increase her Levothyroxine  to 75 mcg po daily before breakfast. Will recheck TFTs prior to next visit and adjust dose accordingly.   - The correct intake of thyroid  hormone (Levothyroxine , Synthroid ), is on empty stomach first thing in the morning, with water , separated by at least 30 minutes from breakfast and other medications,  and separated by more than 4 hours from calcium , iron, multivitamins, acid reflux medications (PPIs).  - This medication is a life-long medication and will  be needed to correct thyroid  hormone imbalances for the rest of your life.  The dose may change from time to time, based on thyroid  blood work.  - It is extremely important to be consistent taking this medication, near the same time each morning.  -AVOID TAKING PRODUCTS CONTAINING BIOTIN (commonly found in Hair, Skin, Nails vitamins) AS IT INTERFERES WITH THE VALIDITY OF THYROID  FUNCTION BLOOD TESTS.  6) Chronic Care/Health Maintenance: -she is on Statin medications and  is encouraged to initiate and continue to follow up with Ophthalmology, Dentist,  Podiatrist at least yearly or according to recommendations, and advised to stay away from smoking. I have recommended yearly flu vaccine and pneumonia vaccine at least every 5 years; moderate intensity exercise for up to 150  minutes weekly; and  sleep for at least 7 hours a day.  - she is advised to maintain close follow up with Antonetta Rollene BRAVO, MD for primary care needs, as well as her other providers for optimal and coordinated care.     I spent  26  minutes in the care of the patient today including review of labs from CMP, Lipids, Thyroid  Function, Hematology (current and previous including abstractions from other facilities); face-to-face time discussing  her blood glucose readings/logs, discussing hypoglycemia and hyperglycemia episodes and symptoms, medications doses, her options of short and long term treatment based on the latest standards of care / guidelines;  discussion about incorporating lifestyle medicine;  and documenting the encounter. Risk reduction counseling performed per USPSTF guidelines to reduce obesity and cardiovascular risk factors.     Please refer to Patient Instructions for Blood Glucose Monitoring and Insulin /Medications Dosing Guide  in media tab for additional information. Please  also refer to  Patient Self Inventory in the Media  tab for reviewed elements of pertinent patient history.  Kimberly Freeman Shed participated in the discussions, expressed understanding, and voiced agreement with the above plans.  All questions were answered to her satisfaction. she is encouraged to contact clinic should she have any questions or concerns prior to her return visit.   Follow up plan: - Return in about 4 months (around 01/06/2025) for Thyroid  follow up, Diabetes F/U with A1c in office, Previsit labs, Bring meter and logs.  Benton Rio, [facility] [address] [place], [place] [address] Phone: [phone] Fax: [phone]  09/08/2024, 11:08 AM

## 2024-09-08 NOTE — Patient Instructions (Signed)

## 2024-09-18 ENCOUNTER — Other Ambulatory Visit: Payer: Self-pay | Admitting: Nurse Practitioner

## 2024-09-22 DIAGNOSIS — E1142 Type 2 diabetes mellitus with diabetic polyneuropathy: Secondary | ICD-10-CM | POA: Diagnosis not present

## 2024-09-22 DIAGNOSIS — B351 Tinea unguium: Secondary | ICD-10-CM | POA: Diagnosis not present

## 2024-09-28 DIAGNOSIS — Z23 Encounter for immunization: Secondary | ICD-10-CM | POA: Diagnosis not present

## 2024-09-30 DIAGNOSIS — E119 Type 2 diabetes mellitus without complications: Secondary | ICD-10-CM | POA: Diagnosis not present

## 2024-10-31 ENCOUNTER — Emergency Department (HOSPITAL_COMMUNITY)

## 2024-10-31 ENCOUNTER — Emergency Department (HOSPITAL_COMMUNITY)
Admission: EM | Admit: 2024-10-31 | Discharge: 2024-11-01 | Disposition: A | Attending: Emergency Medicine | Admitting: Emergency Medicine

## 2024-10-31 ENCOUNTER — Other Ambulatory Visit: Payer: Self-pay

## 2024-10-31 ENCOUNTER — Encounter (HOSPITAL_COMMUNITY): Payer: Self-pay | Admitting: *Deleted

## 2024-10-31 DIAGNOSIS — Z79899 Other long term (current) drug therapy: Secondary | ICD-10-CM | POA: Insufficient documentation

## 2024-10-31 DIAGNOSIS — E119 Type 2 diabetes mellitus without complications: Secondary | ICD-10-CM | POA: Insufficient documentation

## 2024-10-31 DIAGNOSIS — I110 Hypertensive heart disease with heart failure: Secondary | ICD-10-CM | POA: Diagnosis not present

## 2024-10-31 DIAGNOSIS — S0990XA Unspecified injury of head, initial encounter: Secondary | ICD-10-CM | POA: Insufficient documentation

## 2024-10-31 DIAGNOSIS — K760 Fatty (change of) liver, not elsewhere classified: Secondary | ICD-10-CM | POA: Insufficient documentation

## 2024-10-31 DIAGNOSIS — Z7984 Long term (current) use of oral hypoglycemic drugs: Secondary | ICD-10-CM | POA: Insufficient documentation

## 2024-10-31 DIAGNOSIS — Y92410 Unspecified street and highway as the place of occurrence of the external cause: Secondary | ICD-10-CM | POA: Diagnosis not present

## 2024-10-31 DIAGNOSIS — M546 Pain in thoracic spine: Secondary | ICD-10-CM | POA: Diagnosis not present

## 2024-10-31 DIAGNOSIS — Z794 Long term (current) use of insulin: Secondary | ICD-10-CM | POA: Diagnosis not present

## 2024-10-31 DIAGNOSIS — Z7982 Long term (current) use of aspirin: Secondary | ICD-10-CM | POA: Diagnosis not present

## 2024-10-31 DIAGNOSIS — I700 Atherosclerosis of aorta: Secondary | ICD-10-CM | POA: Diagnosis not present

## 2024-10-31 DIAGNOSIS — I2510 Atherosclerotic heart disease of native coronary artery without angina pectoris: Secondary | ICD-10-CM | POA: Diagnosis not present

## 2024-10-31 DIAGNOSIS — M542 Cervicalgia: Secondary | ICD-10-CM | POA: Insufficient documentation

## 2024-10-31 DIAGNOSIS — I509 Heart failure, unspecified: Secondary | ICD-10-CM | POA: Insufficient documentation

## 2024-10-31 LAB — BASIC METABOLIC PANEL WITH GFR
Anion gap: 8 (ref 5–15)
BUN: 23 mg/dL (ref 8–23)
CO2: 28 mmol/L (ref 22–32)
Calcium: 9.3 mg/dL (ref 8.9–10.3)
Chloride: 106 mmol/L (ref 98–111)
Creatinine, Ser: 1.3 mg/dL — ABNORMAL HIGH (ref 0.44–1.00)
GFR, Estimated: 40 mL/min — ABNORMAL LOW
Glucose, Bld: 136 mg/dL — ABNORMAL HIGH (ref 70–99)
Potassium: 4.5 mmol/L (ref 3.5–5.1)
Sodium: 142 mmol/L (ref 135–145)

## 2024-10-31 LAB — I-STAT CHEM 8, ED
BUN: 26 mg/dL — ABNORMAL HIGH (ref 8–23)
Calcium, Ion: 1.17 mmol/L (ref 1.15–1.40)
Chloride: 107 mmol/L (ref 98–111)
Creatinine, Ser: 1.4 mg/dL — ABNORMAL HIGH (ref 0.44–1.00)
Glucose, Bld: 131 mg/dL — ABNORMAL HIGH (ref 70–99)
HCT: 35 % — ABNORMAL LOW (ref 36.0–46.0)
Hemoglobin: 11.9 g/dL — ABNORMAL LOW (ref 12.0–15.0)
Potassium: 4.5 mmol/L (ref 3.5–5.1)
Sodium: 144 mmol/L (ref 135–145)
TCO2: 26 mmol/L (ref 22–32)

## 2024-10-31 LAB — CBC
HCT: 36.6 % (ref 36.0–46.0)
Hemoglobin: 11.5 g/dL — ABNORMAL LOW (ref 12.0–15.0)
MCH: 30.1 pg (ref 26.0–34.0)
MCHC: 31.4 g/dL (ref 30.0–36.0)
MCV: 95.8 fL (ref 80.0–100.0)
Platelets: 212 K/uL (ref 150–400)
RBC: 3.82 MIL/uL — ABNORMAL LOW (ref 3.87–5.11)
RDW: 12.5 % (ref 11.5–15.5)
WBC: 8.7 K/uL (ref 4.0–10.5)
nRBC: 0 % (ref 0.0–0.2)

## 2024-10-31 MED ORDER — IOHEXOL 350 MG/ML SOLN
75.0000 mL | Freq: Once | INTRAVENOUS | Status: AC | PRN
  Administered 2024-10-31: 75 mL via INTRAVENOUS
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
## 2024-10-31 NOTE — Discharge Instructions (Addendum)
 The CT scans did not show any signs of serious injury.  As we discussed they did see a little bit of fluid on the lungs associated with your congestive heart failure.  Continue your diuretic medications.  Follow-up with your cardiologist to be rechecked.  The CT scan also did show some thickening of the wall of your esophagus.  This sometimes can be associated with acid reflux symptoms.  Follow-up with your primary care doctor or consider seeing a GI doctor if you start having any symptoms related to that.

## 2024-10-31 NOTE — ED Notes (Signed)
 Patient transported to CT
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
## 2024-11-05 ENCOUNTER — Encounter: Payer: Self-pay | Admitting: Cardiology

## 2024-11-05 ENCOUNTER — Ambulatory Visit: Attending: Cardiology | Admitting: Cardiology

## 2024-11-05 VITALS — BP 140/72 | HR 67 | Ht 67.0 in | Wt 211.4 lb

## 2024-11-05 DIAGNOSIS — I10 Essential (primary) hypertension: Secondary | ICD-10-CM | POA: Insufficient documentation

## 2024-11-05 DIAGNOSIS — I350 Nonrheumatic aortic (valve) stenosis: Secondary | ICD-10-CM | POA: Diagnosis not present

## 2024-11-05 DIAGNOSIS — E782 Mixed hyperlipidemia: Secondary | ICD-10-CM | POA: Diagnosis not present

## 2024-11-05 DIAGNOSIS — I25119 Atherosclerotic heart disease of native coronary artery with unspecified angina pectoris: Secondary | ICD-10-CM | POA: Diagnosis not present
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
## 2024-11-05 NOTE — Patient Instructions (Signed)
 Medication Instructions:  Your physician recommends that you continue on your current medications as directed. Please refer to the Current Medication list given to you today.  *If you need a refill on your cardiac medications before your next appointment, please call your pharmacy*  Lab Work: None If you have labs (blood work) drawn today and your tests are completely normal, you will receive your results only by: MyChart Message (if you have MyChart) OR A paper copy in the mail If you have any lab test that is abnormal or we need to change your treatment, we will call you to review the results.  Testing/Procedures: Your physician has requested that you have an echocardiogram. Echocardiography is a painless test that uses sound waves to create images of your heart. It provides your doctor with information about the size and shape of your heart and how well your heart's chambers and valves are working. This procedure takes approximately one hour. There are no restrictions for this procedure. Please do NOT wear cologne, perfume, aftershave, or lotions (deodorant is allowed). Please arrive 15 minutes prior to your appointment time.  Please note: We ask at that you not bring children with you during ultrasound (echo/ vascular) testing. Due to room size and safety concerns, children are not allowed in the ultrasound rooms during exams. Our front office staff cannot provide observation of children in our lobby area while testing is being conducted. An adult accompanying a patient to their appointment will only be allowed in the ultrasound room at the discretion of the ultrasound technician under special circumstances. We apologize for any inconvenience.    Follow-Up: At [facility], you and your health needs are our priority.  As part of our continuing mission to provide you with exceptional heart care, our providers are all part of one team.  This team includes your primary Cardiologist  (physician) and Advanced Practice Providers or APPs (Physician Assistants and Nurse Practitioners) who all work together to provide you with the care you need, when you need it.  Your next appointment:   6 month(s)  Provider:   You may see Nona Dell, MD or one of the following Advanced Practice Providers on your designated Care Team:   Randall An, PA-C  [place], [place] Jacolyn Reedy, [place]     We recommend signing up for the patient portal called "MyChart".  Sign up information is provided on this After Visit Summary.  MyChart is used to connect with patients for Virtual Visits (Telemedicine).  Patients are able to view lab/test results, encounter notes, upcoming appointments, etc.  Non-urgent messages can be sent to your provider as well.   To learn more about what you can do with MyChart, go to [email or web address].   Other Instructions

## 2024-11-12 ENCOUNTER — Ambulatory Visit (HOSPITAL_COMMUNITY)
Admission: RE | Admit: 2024-11-12 | Discharge: 2024-11-12 | Disposition: A | Discharge status: Home / Self Care | Source: Ambulatory Visit | Attending: Cardiology | Admitting: Cardiology

## 2024-11-12 ENCOUNTER — Ambulatory Visit: Payer: Self-pay | Admitting: Cardiology

## 2024-11-12 DIAGNOSIS — I350 Nonrheumatic aortic (valve) stenosis: Secondary | ICD-10-CM | POA: Diagnosis not present

## 2024-11-12 LAB — ECHOCARDIOGRAM COMPLETE
AR max vel: 1.32 cm2
AV Area VTI: 1.52 cm2
AV Area mean vel: 1.39 cm2
AV Mean grad: 23.5 mmHg
AV Peak grad: 38.9 mmHg
Ao pk vel: 3.12 m/s
Area-P 1/2: 3.27 cm2
S' Lateral: 1.9 cm

## 2024-11-12 NOTE — Progress Notes (Signed)
*  PRELIMINARY RESULTS* Echocardiogram 2D Echocardiogram has been performed.  Kimberly Freeman 11/12/2024, 11:26 AM

## 2024-11-19 ENCOUNTER — Other Ambulatory Visit: Payer: Self-pay | Admitting: Family Medicine

## 2024-11-26 ENCOUNTER — Ambulatory Visit: Admitting: Cardiology

## 2024-12-23 ENCOUNTER — Ambulatory Visit: Admitting: Family Medicine

## 2025-01-08 ENCOUNTER — Ambulatory Visit: Admitting: Nurse Practitioner

## 2025-02-08 ENCOUNTER — Ambulatory Visit

## 2025-08-13 ENCOUNTER — Other Ambulatory Visit (HOSPITAL_COMMUNITY)
